# Patient Record
Sex: Female | Born: 1965 | Race: Black or African American | Hispanic: No | Marital: Single | Smoking: Former smoker
Health system: Southern US, Community
[De-identification: ages and names within clinical notes are randomized; demographics above are authoritative.]

## PROBLEM LIST (undated history)

## (undated) DIAGNOSIS — G4733 Obstructive sleep apnea (adult) (pediatric): Secondary | ICD-10-CM

## (undated) DIAGNOSIS — I509 Heart failure, unspecified: Secondary | ICD-10-CM

## (undated) DIAGNOSIS — E119 Type 2 diabetes mellitus without complications: Secondary | ICD-10-CM

## (undated) HISTORY — DX: Heart failure, unspecified: I50.9

## (undated) HISTORY — DX: Obstructive sleep apnea (adult) (pediatric): G47.33

## (undated) HISTORY — PX: ABDOMINAL HYSTERECTOMY: SHX81

---

## 2004-11-08 ENCOUNTER — Other Ambulatory Visit: Payer: Self-pay

## 2004-11-08 ENCOUNTER — Emergency Department: Payer: Self-pay | Admitting: Emergency Medicine

## 2005-02-25 ENCOUNTER — Emergency Department: Payer: Self-pay | Admitting: Unknown Physician Specialty

## 2009-04-26 ENCOUNTER — Emergency Department: Payer: Self-pay | Admitting: Emergency Medicine

## 2012-03-01 ENCOUNTER — Emergency Department: Payer: Self-pay | Admitting: Emergency Medicine

## 2012-03-01 LAB — CBC
HCT: 36.7 % (ref 35.0–47.0)
HGB: 11.2 g/dL — ABNORMAL LOW (ref 12.0–16.0)
MCH: 24.6 pg — ABNORMAL LOW (ref 26.0–34.0)
MCV: 81 fL (ref 80–100)
Platelet: 272 10*3/uL (ref 150–440)
RBC: 4.55 10*6/uL (ref 3.80–5.20)
WBC: 9.4 10*3/uL (ref 3.6–11.0)

## 2012-03-01 LAB — BASIC METABOLIC PANEL
Calcium, Total: 9.5 mg/dL (ref 8.5–10.1)
Co2: 26 mmol/L (ref 21–32)
EGFR (African American): >60
Osmolality: 274 (ref 275–301)
Potassium: 4 mmol/L (ref 3.5–5.1)

## 2012-05-03 ENCOUNTER — Ambulatory Visit: Payer: Self-pay | Admitting: Internal Medicine

## 2012-05-18 ENCOUNTER — Ambulatory Visit: Payer: Self-pay | Admitting: Internal Medicine

## 2012-06-17 ENCOUNTER — Ambulatory Visit: Payer: Self-pay | Admitting: Internal Medicine

## 2012-07-06 ENCOUNTER — Ambulatory Visit: Payer: Self-pay | Admitting: Obstetrics and Gynecology

## 2012-08-03 ENCOUNTER — Ambulatory Visit: Payer: Self-pay | Admitting: Obstetrics and Gynecology

## 2012-08-03 LAB — BASIC METABOLIC PANEL
BUN: 9 mg/dL (ref 7–18)
Calcium, Total: 8.9 mg/dL (ref 8.5–10.1)
Co2: 28 mmol/L (ref 21–32)
EGFR (African American): >60
EGFR (Non-African Amer.): >60
Osmolality: 279 (ref 275–301)
Sodium: 140 mmol/L (ref 136–145)

## 2012-08-09 ENCOUNTER — Inpatient Hospital Stay: Payer: Self-pay | Admitting: Obstetrics and Gynecology

## 2012-08-10 LAB — BASIC METABOLIC PANEL
BUN: 3 mg/dL — ABNORMAL LOW (ref 7–18)
Calcium, Total: 8 mg/dL — ABNORMAL LOW (ref 8.5–10.1)
Chloride: 108 mmol/L — ABNORMAL HIGH (ref 98–107)
Co2: 29 mmol/L (ref 21–32)
Creatinine: 0.58 mg/dL — ABNORMAL LOW (ref 0.60–1.30)
EGFR (African American): >60
EGFR (Non-African Amer.): >60
Glucose: 118 mg/dL — ABNORMAL HIGH (ref 65–99)
Osmolality: 283 (ref 275–301)
Potassium: 3.3 mmol/L — ABNORMAL LOW (ref 3.5–5.1)
Sodium: 143 mmol/L (ref 136–145)

## 2012-08-10 LAB — HEMATOCRIT: HCT: 26.8 % — ABNORMAL LOW (ref 35.0–47.0)

## 2013-07-18 ENCOUNTER — Ambulatory Visit: Payer: Self-pay | Admitting: Internal Medicine

## 2014-06-09 NOTE — Op Note (Signed)
PATIENT NAME:  Taylor [name], Taylor [name] MR#:  [id number] DATE OF BIRTH:  Aug 28, 1965  DATE OF PROCEDURE:  08/09/2012  PREOPERATIVE DIAGNOSES:  1.  Symptomatic fibroid uterus.  2.  Menorrhagia.   POSTOPERATIVE DIAGNOSES: 1.  Symptomatic fibroid uterus.  2.  Menorrhagia.  3.  [name] 10 cm left broad ligament fibroid. 4.  An 18 week fibroid uterus.   PROCEDURES: 1.  Total abdominal hysterectomy.  2.  Bilateral salpingectomies.  3.  Cystoscopy.   SURGEON: Suzy Bouchardhomas J. Zakariyah Freimark, M.D.   FIRST ASSISTANT: Veatrice Bourbonicky Evans, M.D.   ANESTHESIA: General endotracheal anesthesia.   INDICATIONS: This is [name] 49 year old gravida 4, para 4.  [name] patient with an 18 week symptomatic fibroid uterus.   DESCRIPTION OF PROCEDURE:  After adequate general endotracheal anesthesia, the patient was placed in the dorsal supine position, legs placed in the [place] stirrups. Abdominal, perineal and vaginal prep performed. The patient's bladder was catheterized with [name] Foley catheter yielding clear urine. The patient received 2 grams IV cefoxitin prior to commencement of the case. [name] vertical incision was made from the symphysis pubis to 3 cm inferior to the umbilicus. Sharp dissection was used to identify the fascia. The fascia was then opened in the midline. The peritoneum was opened sharply without difficulty. The fascia was opened from caudad to cephalad without difficulty. [name] large multilobed fibroid uterus was then delivered through the incision. The O'Connor-O'Sullivan retractor was brought up to the operative field.  Uterus approximately 18 weeks in size with [name] large left broad ligament fibroid noted.  The cornua were grasped with 2 large Kelly clamps and the round ligaments were bilaterally clamped, transected and suture ligated with 0 Vicryl suture. Gentle dissection and clamping occurred of the broad ligaments bilaterally. Approximately [name] 10 cm left broad ligament fibroid was identified. The peritoneum was opened overlying this fibroid and  the fibroid was grasped with thyroid tenaculum and put on traction. The fibroid was enucleated and ultimately delivered after the blood supply was clamped, suture ligated with 0 Vicryl suture. The uterine arteries were then bilaterally clamped, transected and suture ligated with 0 Vicryl suture. The vesicouterine peritoneal fold was identified and the bladder was reflected inferiorly as the cardinal ligaments were then clamped with straight Heaney clamps, transected and suture ligated with 0 Vicryl suture. Vaginal angles were then clamped and the uterus was delivered with part of the uterus previously amputated and the remaining left broad ligament was removed with the cervix. The vaginal cuff was then closed with 0 Vicryl suture; interrupted sutures were used.  The previously removed ovary and fallopian tubes from the uterus were then grasped with Babcock clamp and the fallopian tubes were bilaterally clamped, transected and suture ligated with 0 Vicryl suture. Ovaries remained bilaterally and there was good hemostasis noted. The patient's abdomen was copiously irrigated. No active bleeding noted. Given the amount of dissection, especially in the left broad ligament, cystoscopy was brought up to the operative field. The patient received 1 amp of indigo carmine. The cystoscope was advanced into the bladder and filled lactated Ringer's.  The ureteral orifices were identified bilaterally and [name] normal efflux of indigo carmine was seen from each ureteral ostium The Foley was replaced, and gloves were changed, and the patient's abdomen was then again irrigated and good hemostasis was noted. The retractor and all laparotomy sponges were removed from the patient's abdomen. The sponge and needle count were correct. The patient's anterior abdominal wall was closed with 1 PDS suture with modified Smead-Jones fashion. The  subcutaneous tissues were irrigated and then bovied for hemostasis. Given the depth of the subcutaneous  tissues, the subcutaneous tissues were closed with interrupted 2-0 chromic suture and the skin was reapproximated with staples. There were no complications. Estimated blood loss 400 mL. Intraoperative fluids 1100 mL. The patient tolerated the procedure well and was taken to the recovery room in good condition. ____________________________ Suzy Bouchard, MD tjs:sb D: 08/09/2012 10:08:28 ET T: 08/09/2012 10:19:30 ET JOB#: [id number]  cc: Suzy Bouchard, MD, <Dictator> Suzy Bouchard MD ELECTRONICALLY SIGNED 08/11/2012 10:17

## 2014-10-12 ENCOUNTER — Other Ambulatory Visit: Payer: Self-pay | Admitting: Internal Medicine

## 2014-10-12 DIAGNOSIS — Z1231 Encounter for screening mammogram for malignant neoplasm of breast: Secondary | ICD-10-CM

## 2014-10-26 ENCOUNTER — Ambulatory Visit
Admission: RE | Admit: 2014-10-26 | Discharge: 2014-10-26 | Disposition: A | Discharge status: Home / Self Care | Payer: BLUE CROSS/BLUE SHIELD | Source: Ambulatory Visit | Attending: Internal Medicine | Admitting: Internal Medicine

## 2014-10-26 DIAGNOSIS — Z1231 Encounter for screening mammogram for malignant neoplasm of breast: Secondary | ICD-10-CM | POA: Insufficient documentation

## 2015-08-30 ENCOUNTER — Inpatient Hospital Stay
Admission: EM | Admit: 2015-08-30 | Discharge: 2015-08-31 | DRG: 282 | Disposition: A | Discharge status: Home / Self Care | Payer: BLUE CROSS/BLUE SHIELD | Attending: Internal Medicine | Admitting: Internal Medicine

## 2015-08-30 ENCOUNTER — Encounter: Payer: Self-pay | Admitting: Emergency Medicine

## 2015-08-30 ENCOUNTER — Emergency Department: Payer: BLUE CROSS/BLUE SHIELD

## 2015-08-30 DIAGNOSIS — R609 Edema, unspecified: Secondary | ICD-10-CM | POA: Diagnosis present

## 2015-08-30 DIAGNOSIS — Z7984 Long term (current) use of oral hypoglycemic drugs: Secondary | ICD-10-CM | POA: Diagnosis not present

## 2015-08-30 DIAGNOSIS — Z79899 Other long term (current) drug therapy: Secondary | ICD-10-CM | POA: Diagnosis not present

## 2015-08-30 DIAGNOSIS — E119 Type 2 diabetes mellitus without complications: Secondary | ICD-10-CM | POA: Diagnosis present

## 2015-08-30 DIAGNOSIS — G4730 Sleep apnea, unspecified: Secondary | ICD-10-CM | POA: Diagnosis present

## 2015-08-30 DIAGNOSIS — I509 Heart failure, unspecified: Secondary | ICD-10-CM

## 2015-08-30 DIAGNOSIS — R0602 Shortness of breath: Secondary | ICD-10-CM | POA: Diagnosis present

## 2015-08-30 DIAGNOSIS — Z87891 Personal history of nicotine dependence: Secondary | ICD-10-CM | POA: Diagnosis not present

## 2015-08-30 DIAGNOSIS — I5041 Acute combined systolic (congestive) and diastolic (congestive) heart failure: Secondary | ICD-10-CM | POA: Diagnosis present

## 2015-08-30 DIAGNOSIS — I5023 Acute on chronic systolic (congestive) heart failure: Secondary | ICD-10-CM

## 2015-08-30 DIAGNOSIS — I110 Hypertensive heart disease with heart failure: Principal | ICD-10-CM | POA: Diagnosis present

## 2015-08-30 DIAGNOSIS — E785 Hyperlipidemia, unspecified: Secondary | ICD-10-CM | POA: Diagnosis present

## 2015-08-30 DIAGNOSIS — I214 Non-ST elevation (NSTEMI) myocardial infarction: Secondary | ICD-10-CM | POA: Diagnosis present

## 2015-08-30 HISTORY — DX: Type 2 diabetes mellitus without complications: E11.9

## 2015-08-30 LAB — CBC WITH DIFFERENTIAL/PLATELET
BASOS ABS: 0.1 10*3/uL (ref 0–0.1)
Basophils Relative: 1 %
EOS ABS: 0.1 10*3/uL (ref 0–0.7)
EOS PCT: 1 %
HCT: 40.1 % (ref 35.0–47.0)
HEMOGLOBIN: 13 g/dL (ref 12.0–16.0)
Lymphocytes Relative: 13 %
Lymphs Abs: 1.4 10*3/uL (ref 1.0–3.6)
MCH: 32.3 pg (ref 26.0–34.0)
MCHC: 32.5 g/dL (ref 32.0–36.0)
MCV: 99.4 fL (ref 80.0–100.0)
Monocytes Absolute: 0.6 10*3/uL (ref 0.2–0.9)
Monocytes Relative: 6 %
NEUTROS PCT: 79 %
Neutro Abs: 8.2 10*3/uL — ABNORMAL HIGH (ref 1.4–6.5)
PLATELETS: 285 10*3/uL (ref 150–440)
RBC: 4.03 MIL/uL (ref 3.80–5.20)
RDW: 14.5 % (ref 11.5–14.5)
WBC: 10.4 10*3/uL (ref 3.6–11.0)

## 2015-08-30 LAB — COMPREHENSIVE METABOLIC PANEL
ALBUMIN: 4.2 g/dL (ref 3.5–5.0)
ALK PHOS: 95 U/L (ref 38–126)
ALT: 48 U/L (ref 14–54)
AST: 27 U/L (ref 15–41)
Anion gap: 7 (ref 5–15)
BUN: 9 mg/dL (ref 6–20)
CHLORIDE: 104 mmol/L (ref 101–111)
CO2: 30 mmol/L (ref 22–32)
CREATININE: 0.67 mg/dL (ref 0.44–1.00)
Calcium: 9.5 mg/dL (ref 8.9–10.3)
GFR calc non Af Amer: >60 mL/min (ref >60)
GLUCOSE: 170 mg/dL — AB (ref 65–99)
Potassium: 3.8 mmol/L (ref 3.5–5.1)
SODIUM: 141 mmol/L (ref 135–145)
Total Bilirubin: 1.5 mg/dL — ABNORMAL HIGH (ref 0.3–1.2)
Total Protein: 7.2 g/dL (ref 6.5–8.1)

## 2015-08-30 LAB — TROPONIN I: Troponin I: 0.04 ng/mL (ref <0.03)

## 2015-08-30 LAB — GLUCOSE, CAPILLARY: GLUCOSE-CAPILLARY: 200 mg/dL — AB (ref 65–99)

## 2015-08-30 LAB — BRAIN NATRIURETIC PEPTIDE: B NATRIURETIC PEPTIDE 5: 1164 pg/mL — AB (ref 0.0–100.0)

## 2015-08-30 MED ORDER — FUROSEMIDE 10 MG/ML IJ SOLN
20.0000 mg | Freq: Two times a day (BID) | INTRAMUSCULAR | Status: DC
  Administered 2015-08-31: 20 mg via INTRAVENOUS
  Filled 2015-08-30: qty 2

## 2015-08-30 MED ORDER — FUROSEMIDE 10 MG/ML IJ SOLN
20.0000 mg | Freq: Once | INTRAMUSCULAR | Status: AC
  Administered 2015-08-30: 20 mg via INTRAVENOUS
  Filled 2015-08-30: qty 4

## 2015-08-30 MED ORDER — SODIUM CHLORIDE 0.9% FLUSH
3.0000 mL | INTRAVENOUS | Status: DC | PRN
  Administered 2015-08-31: 3 mL via INTRAVENOUS
  Filled 2015-08-30: qty 3

## 2015-08-30 MED ORDER — GLIMEPIRIDE 2 MG PO TABS
4.0000 mg | ORAL_TABLET | Freq: Every day | ORAL | Status: DC
  Administered 2015-08-31: 2 mg via ORAL
  Filled 2015-08-30: qty 2
  Filled 2015-08-30: qty 1

## 2015-08-30 MED ORDER — ASPIRIN 81 MG PO CHEW
324.0000 mg | CHEWABLE_TABLET | Freq: Once | ORAL | Status: AC
  Administered 2015-08-30: 324 mg via ORAL
  Filled 2015-08-30: qty 4

## 2015-08-30 MED ORDER — INSULIN ASPART 100 UNIT/ML ~~LOC~~ SOLN
0.0000 [IU] | Freq: Three times a day (TID) | SUBCUTANEOUS | Status: DC
  Administered 2015-08-31: 2 [IU] via SUBCUTANEOUS
  Administered 2015-08-31: 1 [IU] via SUBCUTANEOUS
  Filled 2015-08-30: qty 1
  Filled 2015-08-30: qty 2

## 2015-08-30 MED ORDER — SODIUM CHLORIDE 0.9% FLUSH
3.0000 mL | Freq: Two times a day (BID) | INTRAVENOUS | Status: DC
  Administered 2015-08-30 – 2015-08-31 (×2): 3 mL via INTRAVENOUS

## 2015-08-30 MED ORDER — METFORMIN HCL 500 MG PO TABS
1000.0000 mg | ORAL_TABLET | Freq: Two times a day (BID) | ORAL | Status: DC
  Administered 2015-08-31: 1000 mg via ORAL
  Filled 2015-08-30: qty 2

## 2015-08-30 MED ORDER — SODIUM CHLORIDE 0.9 % IV SOLN: 250.0000 mL | INTRAVENOUS | Status: DC | PRN

## 2015-08-30 MED ORDER — ENOXAPARIN SODIUM 40 MG/0.4ML ~~LOC~~ SOLN
40.0000 mg | SUBCUTANEOUS | Status: DC
  Administered 2015-08-30: 40 mg via SUBCUTANEOUS
  Filled 2015-08-30: qty 0.4

## 2015-08-30 MED ORDER — LOSARTAN POTASSIUM 50 MG PO TABS
100.0000 mg | ORAL_TABLET | Freq: Every day | ORAL | Status: DC
  Administered 2015-08-30: 50 mg via ORAL
  Administered 2015-08-31: 100 mg via ORAL
  Filled 2015-08-30 (×2): qty 2

## 2015-08-30 MED ORDER — ATORVASTATIN CALCIUM 20 MG PO TABS
80.0000 mg | ORAL_TABLET | Freq: Every day | ORAL | Status: DC
  Administered 2015-08-30: 80 mg via ORAL
  Filled 2015-08-30: qty 4

## 2015-08-30 MED ORDER — METOPROLOL TARTRATE 25 MG PO TABS
25.0000 mg | ORAL_TABLET | Freq: Two times a day (BID) | ORAL | Status: DC
  Administered 2015-08-30 – 2015-08-31 (×2): 25 mg via ORAL
  Filled 2015-08-30 (×2): qty 1

## 2015-08-30 MED ORDER — ONDANSETRON HCL 4 MG/2ML IJ SOLN: 4.0000 mg | Freq: Four times a day (QID) | INTRAMUSCULAR | Status: DC | PRN

## 2015-08-30 MED ORDER — ASPIRIN 81 MG PO CHEW
81.0000 mg | CHEWABLE_TABLET | Freq: Every day | ORAL | Status: DC
  Administered 2015-08-31: 81 mg via ORAL
  Filled 2015-08-30: qty 1

## 2015-08-30 MED ORDER — ACETAMINOPHEN 325 MG PO TABS: 650.0000 mg | ORAL_TABLET | ORAL | Status: DC | PRN

## 2015-08-30 NOTE — ED Provider Notes (Signed)
[facility] Emergency Department Provider Note   ____________________________________________  Time seen: Approximately 5:10 PM  I have reviewed the triage vital signs and the nursing notes.   HISTORY  Chief Complaint Shortness of Breath    HPI Taylor [name] is a 50 y.o. female with history of diabetes who presents for evaluation of shortness of breath worse with lying flat over the past 2-3 days, gradual onset, constant, currently moderate, no modifying factors. No chest pain or fevers, no vomiting, diarrhea, fevers or chills. No history of coronary artery disease or CHF.   Past Medical History  Diagnosis Date  . Diabetes mellitus without complication (HCC)     There are no active problems to display for this patient.   Past Surgical History  Procedure Laterality Date  . Abdominal hysterectomy      No current outpatient prescriptions on file.  Allergies Review of patient's allergies indicates no known allergies.  No family history on file.  Social History Social History  Substance Use Topics  . Smoking status: Former Games developermoker  . Smokeless tobacco: None  . Alcohol Use: Yes     Comment: occ    Review of Systems Constitutional: No fever/chills Eyes: No visual changes. ENT: No sore throat. Cardiovascular: Denies chest pain. Respiratory: +shortness of breath. Gastrointestinal: No abdominal pain.  No nausea, no vomiting.  No diarrhea.  No constipation. Genitourinary: Negative for dysuria. Musculoskeletal: Negative for back pain. Skin: Negative for rash. Neurological: Negative for headaches, focal weakness or numbness.  10-point ROS otherwise negative.  ____________________________________________   PHYSICAL EXAM:  VITAL SIGNS: ED Triage Vitals  Enc Vitals Group     BP 08/30/15 1550 143/90 mmHg     Pulse Rate 08/30/15 1550 100     Resp 08/30/15 1550 18     Temp 08/30/15 1550 98.5 F (36.9 C)     Temp Source 08/30/15 1550  Oral     SpO2 08/30/15 1550 96 %     Weight 08/30/15 1550 176 lb (79.833 kg)     Height 08/30/15 1550 5\' 1"  (1.549 m)     Head Cir --      Peak Flow --      Pain Score 08/30/15 1555 0     Pain Loc --      Pain Edu? --      Excl. in GC? --     Constitutional: Alert and oriented. Nontoxic appearing and in no acute distress. Eyes: Conjunctivae are normal. PERRL. EOMI. Head: Atraumatic. Nose: No congestion/rhinnorhea. Mouth/Throat: Mucous membranes are moist.  Oropharynx non-erythematous. Neck: No stridor.   Cardiovascular: Normal rate, regular rhythm. Grossly normal heart sounds.  Good peripheral circulation. Respiratory: Mild tachypnea, no increased work of breathing, crackles and Rales at bilateral bases. Gastrointestinal: Soft and nontender. No distention. No CVA tenderness. Genitourinary: deferred Musculoskeletal: 2+ pitting edema bilateral lower extremities.  No joint effusions. Neurologic:  Normal speech and language. No gross focal neurologic deficits are appreciated. No gait instability. Skin:  Skin is warm, dry and intact. No rash noted. Psychiatric: Mood and affect are normal. Speech and behavior are normal.  ____________________________________________   LABS (all labs ordered are listed, but only abnormal results are displayed)  Labs Reviewed  CBC WITH DIFFERENTIAL/PLATELET - Abnormal; Notable for the following:    Neutro Abs 8.2 (*)    All other components within normal limits  COMPREHENSIVE METABOLIC PANEL - Abnormal; Notable for the following:    Glucose, Bld 170 (*)    Total  Bilirubin 1.5 (*)    All other components within normal limits  TROPONIN I - Abnormal; Notable for the following:    Troponin I 0.04 (*)    All other components within normal limits  BRAIN NATRIURETIC PEPTIDE - Abnormal; Notable for the following:    B Natriuretic Peptide 1164.0 (*)    All other components within normal limits   ____________________________________________  EKG  ED  ECG REPORT I, Gayla Doss, the attending physician, personally viewed and interpreted this ECG.   Date: 08/30/2015  EKG Time: 16:14  Rate: 100  Rhythm: normal sinus rhythm  Axis: normal  Intervals:none  ST&T Change: No acute ST elevation or acute ST depression. Nonspecific T-wave abnormality.  ____________________________________________  RADIOLOGY  CXR IMPRESSION: Cardiomegaly with vascular congestion and tiny bilateral pleural effusions. Underlying component of interstitial pulmonary edema not excluded.  Probable basilar atelectasis. ____________________________________________   PROCEDURES  Procedure(s) performed: None  Procedures  Critical Care performed: No  ____________________________________________   INITIAL IMPRESSION / ASSESSMENT AND PLAN / ED COURSE  Pertinent labs & imaging results that were available during my care of the patient were reviewed by me and considered in my medical decision making (see chart for details).  Taylor [name] is a 50 y.o. female with history of diabetes who presents for evaluation of shortness of breath worse with lying flat over the past 2-3 days. On exam, she is nontoxic appearing and generally in no acute distress, mildly intermittently tachycardic and tachypneic with Rales in bilateral lung bases, 2+ pitting edema bilateral lower extremities concerning for new onset CHF. Troponin is mildly elevated 0.04 question N STEMI versus demand ischemia. BNP is elevated at greater than 1000, chest x-ray shows vascular congestion and interstitial edema cannot be excluded. We'll give IV Lasix as well as aspirin. Discussed with the hospitalist for admission 6:45 PM. ____________________________________________   FINAL CLINICAL IMPRESSION(S) / ED DIAGNOSES  Final diagnoses:  Acute congestive heart failure, unspecified congestive heart failure type (HCC)  NSTEMI (non-ST elevated myocardial infarction) (HCC)      NEW MEDICATIONS STARTED  DURING THIS VISIT:  New Prescriptions   No medications on file     Note:  This document was prepared using Dragon voice recognition software and may include unintentional dictation errors.    Gayla Doss, MD 08/30/15 [phone]

## 2015-08-30 NOTE — ED Notes (Signed)
Pt complains of cough, tightness in chest and shortness of breath for 2-3 days.

## 2015-08-30 NOTE — H&P (Signed)
[facility] [place] [facility]   PATIENT NAME: Taylor [name]    MR#:  [id number]  DATE OF BIRTH:  02-11-1966  DATE OF ADMISSION:  08/30/2015  PRIMARY CARE PHYSICIAN: Taylor RegulusANDERSON,MARSHALL W., MD   REQUESTING/REFERRING PHYSICIAN: dr Taylor [name]  CHIEF COMPLAINT:  Increasing shortness of breath for 2-3 days and leg edema.  HISTORY OF PRESENT ILLNESS:  Taylor [name]  is a 50 y.o. female with a known history ofHypertension, diabetes, hyperlipidemia comes to the emergency room with increasing shortness of breath and leg edema for the last 3 days. Patient is sitting of PND and orthopnea. In the emergency room workup showed patient has new onset congestive heart failure. She received 20 mg of IV Lasix. She is currently on oxygen sats 99% on 2 L. She is being admitted for further evaluation of management of acute onset congestive heart failure EF unknown at present.  PAST MEDICAL HISTORY:   Past Medical History  Diagnosis Date  . Diabetes mellitus without complication (HCC)     PAST SURGICAL HISTOIRY:   Past Surgical History  Procedure Laterality Date  . Abdominal hysterectomy      SOCIAL HISTORY:   Social History  Substance Use Topics  . Smoking status: Former Games developermoker  . Smokeless tobacco: Not on file  . Alcohol Use: Yes     Comment: occ    FAMILY HISTORY:  No family history on file.  DRUG ALLERGIES:  No Known Allergies  REVIEW OF SYSTEMS:  Review of Systems  Constitutional: Negative for fever, chills and weight loss.  HENT: Negative for ear discharge, ear pain and nosebleeds.   Eyes: Negative for blurred vision, pain and discharge.  Respiratory: Negative for sputum production, shortness of breath, wheezing and stridor.   Cardiovascular: Positive for orthopnea, leg swelling and PND. Negative for chest pain and palpitations.  Gastrointestinal: Negative for nausea, vomiting, abdominal pain and diarrhea.  Genitourinary: Negative for urgency and frequency.   Musculoskeletal: Negative for back pain and joint pain.  Neurological: Positive for weakness. Negative for sensory change, speech change and focal weakness.  Psychiatric/Behavioral: Negative for depression and hallucinations. The patient is not nervous/anxious.   All other systems reviewed and are negative.    MEDICATIONS AT HOME:   Prior to Admission medications   Not on File      VITAL SIGNS:  Blood pressure 143/90, pulse 101, temperature 98.5 F (36.9 C), temperature source Oral, resp. rate 44, height 5\' 1"  (1.549 m), weight 79.833 kg (176 lb), SpO2 99 %.  PHYSICAL EXAMINATION:  GENERAL:  50 y.o.-year-old patient lying in the bed with no acute distress.  EYES: Pupils equal, round, reactive to light and accommodation. No scleral icterus. Extraocular muscles intact.  HEENT: Head atraumatic, normocephalic. Oropharynx and nasopharynx clear.  NECK:  Supple, no jugular venous distention. No thyroid enlargement, no tenderness.  LUNGS: Normal breath sounds bilaterally, no wheezing,positive rales,no rhonchi. Few bibasilarcrepitation. No use of accessory muscles of respiration.  CARDIOVASCULAR: S1, S2 normal. No murmurs, rubs, or gallops.  ABDOMEN: Soft, nontender, nondistended. Bowel sounds present. No organomegaly or mass.  EXTREMITIES: + pedal edema, cyanosis, or clubbing.  NEUROLOGIC: Cranial nerves II through XII are intact. Muscle strength 5/5 in all extremities. Sensation intact. Gait not checked.  PSYCHIATRIC: The patient is alert and oriented x 3.  SKIN: No obvious rash, lesion, or ulcer.   LABORATORY PANEL:   CBC  Recent Labs Lab 08/30/15 1737  WBC 10.4  HGB 13.0  HCT 40.1  PLT 285   ------------------------------------------------------------------------------------------------------------------  Chemistries   Recent Labs Lab 08/30/15 1737  NA 141  K 3.8  CL 104  CO2 30  GLUCOSE 170*  BUN 9  CREATININE 0.67  CALCIUM 9.5  AST 27  ALT 48  ALKPHOS 95   BILITOT 1.5*   ------------------------------------------------------------------------------------------------------------------  Cardiac Enzymes  Recent Labs Lab 08/30/15 1737  TROPONINI 0.04*   ------------------------------------------------------------------------------------------------------------------  RADIOLOGY:  Dg Chest 2 View  08/30/2015  CLINICAL DATA:  Shortness of breath for a couple of days. EXAM: CHEST  2 VIEW COMPARISON:  None. FINDINGS: Two views study shows bibasilar atelectasis or infiltrate with tiny bilateral pleural effusions. Cardiopericardial silhouette is enlarged. Vascular congestion noted without overt airspace pulmonary edema. The visualized bony structures of the thorax are intact. IMPRESSION: Cardiomegaly with vascular congestion and tiny bilateral pleural effusions. Underlying component of interstitial pulmonary edema not excluded. Probable basilar atelectasis. Electronically Signed   By: Taylor [name] M.D.   On: 08/30/2015 16:18    EKG:   Normal sinus rhythm no acute ST elevation or depression.  IMPRESSION AND PLAN:   Taylor [name]  is a 50 y.o. female with a known history ofHypertension, diabetes, hyperlipidemia comes to the emergency room with increasing shortness of breath and leg edema for the last 3 days. Patient is sitting of PND and orthopnea. In the emergency room workup showed patient has new onset congestive heart failure.   1. Acute congestive heart failure. EF unknown. -Admit to telemetry -IV Lasix 20 mg twice a day. Monitor daily weights, I's and O's and metabolic panel -Echo of the heart -Cardiology consultation placed -We'll resumes patient home meds which is losartan. Add beta blockers.  2. Hyperlipidemia continue atorvastatin  3. Type 2 diabetes -Continue Amaryl and metoprolol. I will hold off on Actos given congestive heart failure. Continue sliding scale insulin.  4. Suspected sleep apnea -Patient will benefit from sleep study  as outpatient. We'll defer to primary care physician  5. DVT prophylaxis subcutaneous Lovenox    All the records are reviewed and case discussed with ED provider. Management plans discussed with the patient, family and they are in agreement.  CODE STATUS: Full  TOTAL TIME TAKING CARE OF THIS PATIENT: 45s.    Dorianna Mckiver M.D on 08/30/2015 at 7:17 PM  Between 7am to 6pm - Pager - [phone]  After 6pm go to [email or web address] - password [email or web address] [facility]  [facility]  Office  [phone]  CC: Primary care physician; Taylor Regulus., MD

## 2015-08-31 ENCOUNTER — Inpatient Hospital Stay
Admit: 2015-08-31 | Discharge: 2015-08-31 | Disposition: A | Discharge status: Home / Self Care | Payer: BLUE CROSS/BLUE SHIELD | Attending: Internal Medicine | Admitting: Internal Medicine

## 2015-08-31 LAB — GLUCOSE, CAPILLARY
GLUCOSE-CAPILLARY: 149 mg/dL — AB (ref 65–99)
Glucose-Capillary: 184 mg/dL — ABNORMAL HIGH (ref 65–99)
Glucose-Capillary: 102 mg/dL — ABNORMAL HIGH (ref 65–99)

## 2015-08-31 LAB — BASIC METABOLIC PANEL
Anion gap: 7 (ref 5–15)
BUN: 8 mg/dL (ref 6–20)
CALCIUM: 8.9 mg/dL (ref 8.9–10.3)
CHLORIDE: 104 mmol/L (ref 101–111)
CO2: 31 mmol/L (ref 22–32)
CREATININE: 0.53 mg/dL (ref 0.44–1.00)
GFR calc non Af Amer: >60 mL/min (ref >60)
Glucose, Bld: 127 mg/dL — ABNORMAL HIGH (ref 65–99)
Potassium: 3 mmol/L — ABNORMAL LOW (ref 3.5–5.1)
SODIUM: 142 mmol/L (ref 135–145)

## 2015-08-31 LAB — PHOSPHORUS: Phosphorus: 5.7 mg/dL — ABNORMAL HIGH (ref 2.5–4.6)

## 2015-08-31 LAB — MAGNESIUM: MAGNESIUM: 1.6 mg/dL — AB (ref 1.7–2.4)

## 2015-08-31 LAB — ECHOCARDIOGRAM COMPLETE
HEIGHTINCHES: 61 in
Weight: 2752 oz

## 2015-08-31 MED ORDER — FUROSEMIDE 20 MG PO TABS: 20.0000 mg | ORAL_TABLET | Freq: Every day | ORAL | Status: DC

## 2015-08-31 MED ORDER — MAGNESIUM SULFATE 2 GM/50ML IV SOLN
2.0000 g | Freq: Once | INTRAVENOUS | Status: AC
  Administered 2015-08-31: 2 g via INTRAVENOUS
  Filled 2015-08-31: qty 50

## 2015-08-31 MED ORDER — POTASSIUM CHLORIDE CRYS ER 20 MEQ PO TBCR
40.0000 meq | EXTENDED_RELEASE_TABLET | Freq: Once | ORAL | Status: AC
  Administered 2015-08-31: 40 meq via ORAL

## 2015-08-31 MED ORDER — POTASSIUM CHLORIDE CRYS ER 20 MEQ PO TBCR
40.0000 meq | EXTENDED_RELEASE_TABLET | Freq: Once | ORAL | Status: AC
  Administered 2015-08-31: 40 meq via ORAL
  Filled 2015-08-31: qty 2

## 2015-08-31 NOTE — Consult Note (Signed)
[facility] Cardiology Consultation Note  Patient ID: GRACEANNE GUIN, MRN: [id number], DOB/AGE: 06/19/1965 50 y.o. Admit date: 08/30/2015   Date of Consult: 08/31/2015 Primary Physician: Lauro Regulus., MD Primary Cardiologist: None  Chief Complaint:  Chief Complaint  Patient presents with  . Shortness of Breath   Reason for Consult: acute congestive heart failure  HPI: 50 y.o. female with diabetes with complication essential hypertension mixed hyperlipidemia with acute onset of significant shortness of breath weakness fatigue get in the last several weeks to a month without evidence of chest discomfort waxing and waning to the point where she cannot do any physical activity at all and additional had significant orthopnea and PND with some lower extremity edema. The patient was seen in the emergency room with an EKG showing normal sinus rhythm left atrial enlargement and nonspecific ST and T-wave changes with an elevated troponin of 0.04 most consistent with demand ischemia. The patient did have a chest x-ray showing significant pulmonary edema consistent with acute systolic dysfunction congestive heart failure. The patient has had significant improvements of the this with oxygenation and intravenous diuretics. Patient does have stable hypertension with use of losartan and metoprolol and high intensity cholesterol therapy with atorvastatin  Past Medical History  Diagnosis Date  . Diabetes mellitus without complication [facility])       Surgical History:  Past Surgical History  Procedure Laterality Date  . Abdominal hysterectomy       Home Meds: Prior to Admission medications   Medication Sig Start Date End Date Taking? Authorizing Provider  atorvastatin (LIPITOR) 80 MG tablet Take 1 tablet by mouth daily. 02/15/15 02/15/16 Yes Historical Provider, MD  canagliflozin (INVOKANA) 100 MG TABS tablet Take 1 tablet by mouth daily. 05/24/15  Yes Historical Provider, MD  glimepiride (AMARYL) 2  MG tablet Take 2 tablets by mouth 2 (two) times daily. 02/15/15  Yes Historical Provider, MD  losartan (COZAAR) 100 MG tablet Take 1 tablet by mouth daily. 02/15/15  Yes Historical Provider, MD  metFORMIN (GLUCOPHAGE) 500 MG tablet Take 2 tablets by mouth 2 (two) times daily with a meal. 02/15/15  Yes Historical Provider, MD  pioglitazone (ACTOS) 30 MG tablet Take 1 tablet by mouth daily. 02/15/15  Yes Historical Provider, MD    Inpatient Medications:  . aspirin  81 mg Oral Daily  . atorvastatin  80 mg Oral q1800  . enoxaparin (LOVENOX) injection  40 mg Subcutaneous Q24H  . furosemide  20 mg Intravenous BID  . glimepiride  4 mg Oral Q breakfast  . insulin aspart  0-9 Units Subcutaneous TID WC  . losartan  100 mg Oral Daily  . metFORMIN  1,000 mg Oral BID WC  . metoprolol tartrate  25 mg Oral BID  . sodium chloride flush  3 mL Intravenous Q12H      Allergies: No Known Allergies  Social History   Social History  . Marital Status: Single    Spouse Name: N/A  . Number of Children: N/A  . Years of Education: N/A   Occupational History  . Not on file.   Social History Main Topics  . Smoking status: Former Games developer  . Smokeless tobacco: Not on file  . Alcohol Use: Yes     Comment: occ  . Drug Use: Not on file  . Sexual Activity: Not on file   Other Topics Concern  . Not on file   Social History Narrative     No family history on file.   Review of Systems  Positive for Shortness of breath PND orthopnea Negative for: General:  chills, fever, night sweats or weight changes.  Cardiovascular: Positive for PND orthopnea negative for syncope dizziness  Dermatological skin lesions rashes Respiratory: Positive for Cough congestion Urologic: Frequent urination urination at night and hematuria Abdominal: negative for nausea, vomiting, diarrhea, bright red blood per rectum, melena, or hematemesis Neurologic: negative for visual changes, and/or hearing changes  All other systems  reviewed and are otherwise negative except as noted above.  Labs:  Recent Labs  08/30/15 1737  TROPONINI 0.04*   Lab Results  Component Value Date   WBC 10.4 08/30/2015   HGB 13.0 08/30/2015   HCT 40.1 08/30/2015   MCV 99.4 08/30/2015   PLT 285 08/30/2015    Recent Labs Lab 08/30/15 1737 08/31/15 0509  NA 141 142  K 3.8 3.0*  CL 104 104  CO2 30 31  BUN 9 8  CREATININE 0.67 0.53  CALCIUM 9.5 8.9  PROT 7.2  --   BILITOT 1.5*  --   ALKPHOS 95  --   ALT 48  --   AST 27  --   GLUCOSE 170* 127*   No results found for: CHOL, HDL, LDLCALC, TRIG No results found for: DDIMER  Radiology/Studies:  Dg Chest 2 View  08/30/2015  CLINICAL DATA:  Shortness of breath for a couple of days. EXAM: CHEST  2 VIEW COMPARISON:  None. FINDINGS: Two views study shows bibasilar atelectasis or infiltrate with tiny bilateral pleural effusions. Cardiopericardial silhouette is enlarged. Vascular congestion noted without overt airspace pulmonary edema. The visualized bony structures of the thorax are intact. IMPRESSION: Cardiomegaly with vascular congestion and tiny bilateral pleural effusions. Underlying component of interstitial pulmonary edema not excluded. Probable basilar atelectasis. Electronically Signed   By: Kennith CenterEric  Mansell M.D.   On: 08/30/2015 16:18    EKG: Normal sinus rhythm with left atrial enlargement and nonspecific ST and T-wave changes  Weights: Filed Weights   08/30/15 1550 08/30/15 2002 08/31/15 0518  Weight: 176 lb (79.833 kg) 174 lb 12.8 oz (79.289 kg) 172 lb (78.019 kg)     Physical Exam: Blood pressure 138/92, pulse 96, temperature 97.9 F (36.6 C), temperature source Oral, resp. rate 24, height 5\' 1"  (1.549 m), weight 172 lb (78.019 kg), SpO2 100 %. Body mass index is 32.52 kg/(m^2). General: Well developed, well nourished, in no acute distress. Head eyes ears nose throat: Normocephalic, atraumatic, sclera non-icteric, no xanthomas, nares are without discharge. No  apparent thyromegaly and/or mass  Lungs: Normal respiratory effort.  no wheezes,Basilar rales, no rhonchi.  Heart: RRR with normal S1 S2. no murmur gallop, no rub, PMI is normal size and placement, carotid upstroke normal without bruit, jugular venous pressure is normal Abdomen: Soft, non-tender, non-distended with normoactive bowel sounds. No hepatomegaly. No rebound/guarding. No obvious abdominal masses. Abdominal aorta is normal size without bruit Extremities: Trace to 1+ edema. no cyanosis, no clubbing, no ulcers  Peripheral : 2+ bilateral upper extremity pulses, 2+ bilateral femoral pulses, 2+ bilateral dorsal pedal pulse Neuro: Alert and oriented. No facial asymmetry. No focal deficit. Moves all extremities spontaneously. Musculoskeletal: Normal muscle tone without kyphosis Psych:  Responds to questions appropriately with a normal affect.    Assessment: 50 year old female with diabetes with complications essential hypertension makes hyperlipidemia and abnormal EKG with elevated troponin consistent with demand ischemia rather than acute coronary syndrome having new onset acute systolic dysfunction congestive heart failure  Plan: 1. Continue intravenous Lasix for pulmonary edema lower extremity edema and acute  systolic dysfunction congestive heart failure 2. Continue hypertension control with beta blocker and angiotensin receptor blocker 3. Serial ECG and enzymes to assess for possible myocardial infarction 4. Echocardiogram for LV systolic dysfunction and cause of systolic dysfunction congestive heart failure 5. The cholesterol therapy with atorvastatin 6. Further diagnostic testing and treatment options after above  Signed, Lamar Blinks M.D. [facility] Cardiology 08/31/2015, 8:55 AM

## 2015-08-31 NOTE — Progress Notes (Signed)
Oxygen via Daisy dc'd.  On room air SpO2 ranged from 93 - 99%.  Over a 15 minute period she had no S&S of resp distress.

## 2015-08-31 NOTE — Discharge Instructions (Signed)
Heart Failure Clinic appointment on September 13, 2015 at 12:00pm with Clarisa Kindredina Master Touchet, FNP. Please call [phone] to reschedule.

## 2015-08-31 NOTE — Discharge Summary (Signed)
[facility] [place] [facility]   PATIENT NAME: Taylor RochesterLori [name]    MR#:  [id number]  DATE OF BIRTH:  [birth year]  DATE OF ADMISSION:  08/30/2015 ADMITTING PHYSICIAN: Enedina FinnerSona Patel, MD  DATE OF DISCHARGE: 08/31/2015  PRIMARY CARE PHYSICIAN: Lauro RegulusANDERSON,MARSHALL W., MD    ADMISSION DIAGNOSIS:  NSTEMI (non-ST elevated myocardial infarction) (HCC) [I21.4] Acute congestive heart failure, unspecified congestive heart failure type (HCC) [I50.9]  DISCHARGE DIAGNOSIS:  Active Problems:   CHF (congestive heart failure) (HCC)   SECONDARY DIAGNOSIS:   Past Medical History  Diagnosis Date  . Diabetes mellitus without complication [facility](HCC)     HOSPITAL COURSE:   50 year female with a history of essential hypertension and diabetes who presents with shortness of breath and lower extremity edema.  1. Acute congestive heart failure with Echo reporting Ef of 20% and diffuse hypokinese: Patient has responded well to IV Lasix.Her symptoms improved and she diuresed well. She will need close outpatient follow up with Cardiology. She will need outpatient stress test/cardiac cath to better evaluate hypokinesis seen on ECHO. She never had chest pain while in the hospital. Continue metoprolol and losartan. Dr Gwen PoundsKowalski was involved in her case during this hospital stay and she will follow up with him next week.   2. Diabetes: Continue her outpatient regimen  and ADA diet.  3. Hyperlipidemia: Continue atorvastatin.  4. Suspected sleep apnea: Patient would benefit from outpatient sleep study which will be deferred to PCP.   DISCHARGE CONDITIONS AND DIET:   Cardiac diabetic diet Stable condition  CONSULTS OBTAINED:  Treatment Team:  Lamar BlinksBruce J Kowalski, MD  DRUG ALLERGIES:  No Known Allergies  DISCHARGE MEDICATIONS:   Current Discharge Medication List    START taking these medications   Details  furosemide (LASIX) 20 MG tablet Take 1 tablet (20 mg total) by mouth daily. Qty: 30  tablet, Refills: 0      CONTINUE these medications which have NOT CHANGED   Details  atorvastatin (LIPITOR) 80 MG tablet Take 1 tablet by mouth daily.    canagliflozin (INVOKANA) 100 MG TABS tablet Take 1 tablet by mouth daily.    glimepiride (AMARYL) 2 MG tablet Take 2 tablets by mouth 2 (two) times daily.    losartan (COZAAR) 100 MG tablet Take 1 tablet by mouth daily.    metFORMIN (GLUCOPHAGE) 500 MG tablet Take 2 tablets by mouth 2 (two) times daily with a meal.    pioglitazone (ACTOS) 30 MG tablet Take 1 tablet by mouth daily.              Today   CHIEF COMPLAINT:  SOB resolved LE resolved   VITAL SIGNS:  Blood pressure 116/84, pulse 88, temperature 97.7 F (36.5 C), temperature source Oral, resp. rate 20, height 5\' 1"  (1.549 m), weight 78.019 kg (172 lb), SpO2 97 %.   REVIEW OF SYSTEMS:  Review of Systems  Constitutional: Negative for fever, chills and malaise/fatigue.  HENT: Negative for ear discharge, ear pain, hearing loss, nosebleeds and sore throat.   Eyes: Negative for blurred vision and pain.  Respiratory: Negative for cough, hemoptysis, shortness of breath and wheezing.   Cardiovascular: Negative for chest pain, palpitations and leg swelling.  Gastrointestinal: Negative for nausea, vomiting, abdominal pain, diarrhea and blood in stool.  Genitourinary: Negative for dysuria.  Musculoskeletal: Negative for back pain.  Neurological: Negative for dizziness, tremors, speech change, focal weakness, seizures and headaches.  Endo/Heme/Allergies: Does not bruise/bleed easily.  Psychiatric/Behavioral: Negative for depression, suicidal ideas  and hallucinations.     PHYSICAL EXAMINATION:  GENERAL:  50 y.o.-year-old patient lying in the bed with no acute distress.  NECK:  Supple, no jugular venous distention. No thyroid enlargement, no tenderness.  LUNGS: Normal breath sounds bilaterally, no wheezing, rales,rhonchi  No use of accessory muscles of respiration.   CARDIOVASCULAR: S1, S2 normal. No murmurs, rubs, or gallops.  ABDOMEN: Soft, non-tender, non-distended. Bowel sounds present. No organomegaly or mass.  EXTREMITIES: No pedal edema, cyanosis, or clubbing.  PSYCHIATRIC: The patient is alert and oriented x 3.  SKIN: No obvious rash, lesion, or ulcer.   DATA REVIEW:   CBC  Recent Labs Lab 08/30/15 1737  WBC 10.4  HGB 13.0  HCT 40.1  PLT 285    Chemistries   Recent Labs Lab 08/30/15 1737 08/31/15 0509  NA 141 142  K 3.8 3.0*  CL 104 104  CO2 30 31  GLUCOSE 170* 127*  BUN 9 8  CREATININE 0.67 0.53  CALCIUM 9.5 8.9  MG  --  1.6*  AST 27  --   ALT 48  --   ALKPHOS 95  --   BILITOT 1.5*  --     Cardiac Enzymes  Recent Labs Lab 08/30/15 1737  TROPONINI 0.04*    Microbiology Results  @  RADIOLOGY:  Dg Chest 2 View  08/30/2015  CLINICAL DATA:  Shortness of breath for a couple of days. EXAM: CHEST  2 VIEW COMPARISON:  None. FINDINGS: Two views study shows bibasilar atelectasis or infiltrate with tiny bilateral pleural effusions. Cardiopericardial silhouette is enlarged. Vascular congestion noted without overt airspace pulmonary edema. The visualized bony structures of the thorax are intact. IMPRESSION: Cardiomegaly with vascular congestion and tiny bilateral pleural effusions. Underlying component of interstitial pulmonary edema not excluded. Probable basilar atelectasis. Electronically Signed   By: Kennith Center M.D.   On: 08/30/2015 16:18      Management plans discussed with the patient and she is in agreement. Stable for discharge home  Patient should follow up with dr Gwen Pounds 1 week  CODE STATUS:     Code Status Orders        Start     Ordered   08/30/15 2002  Full code   Continuous     08/30/15 2001    Code Status History    Date Active Date Inactive Code Status Order ID Comments User Context   This patient has a current code status but no historical code status.      TOTAL TIME  TAKING CARE OF THIS PATIENT: 35 minutes.    Note: This dictation was prepared with Dragon dictation along with smaller phrase technology. Any transcriptional errors that result from this process are unintentional.  Margretta Zamorano M.D on 08/31/2015 at 1:49 PM  Between 7am to 6pm - Pager - [phone] After 6pm go to [email or web address] - password [email or web address]  [facility] [place]  Office  [phone]  CC: Primary care physician; Lauro Regulus., MD

## 2015-08-31 NOTE — Progress Notes (Signed)
Electrolyte Replacement CONSULT NOTE - INITIAL   Pharmacy Consult for Electrolyte Supplementation   No Known Allergies  Patient Measurements: Height: 5\' 1"  (154.9 cm) Weight: 172 lb (78.019 kg) IBW/kg (Calculated) : 47.8   Vital Signs: Temp: 97.9 F (36.6 C) (07/14 0749) Temp Source: Oral (07/14 0749) BP: 138/92 mmHg (07/14 0749) Pulse Rate: 96 (07/14 0749) Intake/Output from previous day: 07/13 0701 - 07/14 0700 In: 3 [I.V.:3] Out: -  Intake/Output from this shift: Total I/O In: 293 [P.O.:240; I.V.:3; IV Piggyback:50] Out: 800 [Urine:800]  Labs:  Recent Labs  08/30/15 1737 08/31/15 0509  WBC 10.4  --   HGB 13.0  --   HCT 40.1  --   PLT 285  --   CREATININE 0.67 0.53  MG  --  1.6*  PHOS  --  5.7*  ALBUMIN 4.2  --   PROT 7.2  --   AST 27  --   ALT 48  --   ALKPHOS 95  --   BILITOT 1.5*  --    Estimated Creatinine Clearance: 78.7 mL/min (by C-G formula based on Cr of 0.53).   Microbiology: No results found for this or any previous visit (from the past 720 hour(s)).  Medical History: Past Medical History  Diagnosis Date  . Diabetes mellitus without complication (HCC)     Medications:  Scheduled:  . aspirin  81 mg Oral Daily  . atorvastatin  80 mg Oral q1800  . enoxaparin (LOVENOX) injection  40 mg Subcutaneous Q24H  . furosemide  20 mg Intravenous BID  . glimepiride  4 mg Oral Q breakfast  . insulin aspart  0-9 Units Subcutaneous TID WC  . losartan  100 mg Oral Daily  . magnesium sulfate 1 - 4 g bolus IVPB  2 g Intravenous Once  . metFORMIN  1,000 mg Oral BID WC  . metoprolol tartrate  25 mg Oral BID  . potassium chloride  40 mEq Oral Once  . sodium chloride flush  3 mL Intravenous Q12H    Assessment: Pharmacy consulted to replace electrolytes in a 50 yo female admitted with new onset CHF.  Patient is currently ordered furosemide 20 mg IV q12h.   Potassium this AM of 3.0, Mag: 1.6, Phos: 5.7   Plan:  Will supplement with Potassium  Chloride 40 meq po x 2 doses and recheck level at 1800 this evening to ensure no further supplementation.  Patient may require chronic potassium supplementation if requires scheduled loop diuretic for HF symptom management.  Will also order a follow BMP/mag/phos in AM.   Clarisa Schoolsrystal Orien Mayhall, PharmD Clinical Pharmacist 08/31/2015

## 2015-08-31 NOTE — Progress Notes (Signed)
*  PRELIMINARY RESULTS* Echocardiogram 2D Echocardiogram has been performed.  Cristela BlueHege, Shamere Dilworth 08/31/2015, 9:54 AM

## 2015-08-31 NOTE — Progress Notes (Signed)
Arrival Method: via wheelchair with ED tech & husband Mental Orientation: A&O Telemetry: MX40-26 Skin: intact, verified by Mayra Neerakera Nesbitt, RN IV: 20g right AC Pain: no pain Tubes: O2 2L acutely  Safety Measures: Safety Fall Prevention Plan has been given, discussed & signed, non skid socks in place. 2A Orientation: Patient has been orientated to the room, unit & staff.  Family: Has been informed of plan of care.  Orders have been reviewed & implemented. Will continue to monitor the patient. Call light has been placed within reach.  Eden LatheLexi Miller, RN

## 2015-08-31 NOTE — Progress Notes (Signed)
Initial Heart Failure Clinic appointment scheduled for September 13, 2015 at 12:00pm. Thank you.

## 2015-08-31 NOTE — Care Management (Signed)
New diagnosis of CHf. Referral made to heart failure clinic. Met with patient. She lives at home with her boyfriend who is her support system. She states she has some scales. Patient continues to work and drive. No DME. PCP is Dr. Ouida Sills. Pharmacy: [facility]. No needs identified for home. Will assist as needed.

## 2015-08-31 NOTE — Progress Notes (Signed)
[facility] - [place] [facility]   PATIENT NAME: Taylor [name] [name]    MR#:  [id number]  DATE OF BIRTH:  1965-06-28  SUBJECTIVE:   Sob has improved Lower extremity edema has improved.  REVIEW OF SYSTEMS:    Review of Systems  Constitutional: Negative for fever, chills and malaise/fatigue.  HENT: Negative for ear discharge, ear pain, hearing loss, nosebleeds and sore throat.   Eyes: Negative for blurred vision and pain.  Respiratory: Negative for cough, hemoptysis, shortness of breath and wheezing.   Cardiovascular: Negative for chest pain, palpitations and leg swelling.  Gastrointestinal: Negative for nausea, vomiting, abdominal pain, diarrhea and blood in stool.  Genitourinary: Negative for dysuria.  Musculoskeletal: Negative for back pain.  Neurological: Negative for dizziness, tremors, speech change, focal weakness, seizures and headaches.  Endo/Heme/Allergies: Does not bruise/bleed easily.  Psychiatric/Behavioral: Negative for depression, suicidal ideas and hallucinations.    Tolerating Diet: yes      DRUG ALLERGIES:  No Known Allergies  VITALS:  Blood pressure 116/84, pulse 88, temperature 97.7 F (36.5 C), temperature source Oral, resp. rate 20, height 5\' 1"  (1.549 m), weight 78.019 kg (172 lb), SpO2 97 %.  PHYSICAL EXAMINATION:   Physical Exam  Constitutional: She is oriented to person, place, and time and well-developed, well-nourished, and in no distress. No distress.  HENT:  Head: Normocephalic.  Eyes: No scleral icterus.  Neck: Normal range of motion. Neck supple. No JVD present. No tracheal deviation present.  Cardiovascular: Normal rate, regular rhythm and normal heart sounds.  Exam reveals no gallop and no friction rub.   No murmur heard. Pulmonary/Chest: Effort normal and breath sounds normal. No respiratory distress. She has no wheezes. She has no rales. She exhibits no tenderness.  Abdominal: Soft. Bowel sounds are normal. She exhibits no  distension and no mass. There is no tenderness. There is no rebound and no guarding.  Musculoskeletal: Normal range of motion. She exhibits no edema.  Neurological: She is alert and oriented to person, place, and time.  Skin: Skin is warm. No rash noted. No erythema.  Psychiatric: Affect and judgment normal.      LABORATORY PANEL:   CBC  Recent Labs Lab 08/30/15 1737  WBC 10.4  HGB 13.0  HCT 40.1  PLT 285   ------------------------------------------------------------------------------------------------------------------  Chemistries   Recent Labs Lab 08/30/15 1737 08/31/15 0509  NA 141 142  K 3.8 3.0*  CL 104 104  CO2 30 31  GLUCOSE 170* 127*  BUN 9 8  CREATININE 0.67 0.53  CALCIUM 9.5 8.9  MG  --  1.6*  AST 27  --   ALT 48  --   ALKPHOS 95  --   BILITOT 1.5*  --    ------------------------------------------------------------------------------------------------------------------  Cardiac Enzymes  Recent Labs Lab 08/30/15 1737  TROPONINI 0.04*   ------------------------------------------------------------------------------------------------------------------  RADIOLOGY:  Dg Chest 2 View  08/30/2015  CLINICAL DATA:  Shortness of breath for a couple of days. EXAM: CHEST  2 VIEW COMPARISON:  None. FINDINGS: Two views study shows bibasilar atelectasis or infiltrate with tiny bilateral pleural effusions. Cardiopericardial silhouette is enlarged. Vascular congestion noted without overt airspace pulmonary edema. The visualized bony structures of the thorax are intact. IMPRESSION: Cardiomegaly with vascular congestion and tiny bilateral pleural effusions. Underlying component of interstitial pulmonary edema not excluded. Probable basilar atelectasis. Electronically Signed   By: Kennith CenterEric  Mansell M.D.   On: 08/30/2015 16:18     ASSESSMENT AND PLAN:    [age] year female with a history  of essential hypertension and diabetes who presents with shortness of breath and lower  extremity edema.  1. Acute congestive heart failure: Patient has responded well to IV Lasix. Follow up on echo cardiac exam. Continue metoprolol and losartan.  2. Diabetes: Continue her outpatient regimen with sliding scale insulin and ADA diet.  3. Hyperlipidemia: Continue atorvastatin.  4. Suspected sleep apnea: Patient would benefit from outpatient sleep study which will be deferred to PCP.  Management plans discussed with the patient and she is in agreement.  CODE STATUS: full  TOTAL TIME TAKING CARE OF THIS PATIENT: 30 minutes.   D.w dr Gwen Pounds  POSSIBLE D/C 1-2 days, DEPENDING ON CLINICAL CONDITION.   Arijana Narayan M.D on 08/31/2015 at 12:44 PM  Between 7am to 6pm - Pager - [phone] After 6pm go to [email or web address] - password [email or web address]  [facility] [place] [facility]  Office  [phone]  CC: Primary care physician; Lauro Regulus., MD  Note: This dictation was prepared with Dragon dictation along with smaller phrase technology. Any transcriptional errors that result from this process are unintentional.

## 2015-08-31 NOTE — Progress Notes (Signed)
Patient ambulated around the unit twice. SpO2 remained at 97%.

## 2015-09-13 ENCOUNTER — Ambulatory Visit: Payer: BLUE CROSS/BLUE SHIELD | Attending: Family | Admitting: Family

## 2015-09-13 ENCOUNTER — Encounter: Payer: Self-pay | Admitting: Family

## 2015-09-13 VITALS — BP 105/71 | HR 101 | Resp 18 | Ht 61.0 in | Wt 170.0 lb

## 2015-09-13 DIAGNOSIS — Z7984 Long term (current) use of oral hypoglycemic drugs: Secondary | ICD-10-CM | POA: Diagnosis not present

## 2015-09-13 DIAGNOSIS — E119 Type 2 diabetes mellitus without complications: Secondary | ICD-10-CM | POA: Diagnosis not present

## 2015-09-13 DIAGNOSIS — Z87891 Personal history of nicotine dependence: Secondary | ICD-10-CM | POA: Insufficient documentation

## 2015-09-13 DIAGNOSIS — Z90710 Acquired absence of both cervix and uterus: Secondary | ICD-10-CM | POA: Insufficient documentation

## 2015-09-13 DIAGNOSIS — Z79899 Other long term (current) drug therapy: Secondary | ICD-10-CM | POA: Diagnosis not present

## 2015-09-13 DIAGNOSIS — R000 Tachycardia, unspecified: Secondary | ICD-10-CM | POA: Insufficient documentation

## 2015-09-13 DIAGNOSIS — I5022 Chronic systolic (congestive) heart failure: Secondary | ICD-10-CM | POA: Diagnosis present

## 2015-09-13 MED ORDER — FUROSEMIDE 20 MG PO TABS
20.0000 mg | ORAL_TABLET | Freq: Every day | ORAL | 5 refills | Status: DC
Start: 2015-09-13 — End: 2016-02-25

## 2015-09-13 NOTE — Progress Notes (Signed)
Subjective:    Patient ID: Taylor [name], female    DOB: Dec 31, 1965, 50 y.o.   MRN: [id number]  Congestive Heart Failure  Presents for initial visit. The disease course has been stable. Pertinent negatives include no abdominal pain, chest pain, edema, fatigue, orthopnea, palpitations or shortness of breath. The symptoms have been stable. Past treatments include beta blockers, angiotensin receptor blockers and salt and fluid restriction. The treatment provided significant relief. Compliance with prior treatments has been good. Her past medical history is significant for DM. There is no history of CAD, CVA or HTN.  Other  This is a chronic (diabetes) problem. The current episode started more than 1 year ago. The problem has been unchanged. Pertinent negatives include no abdominal pain, chest pain, congestion, coughing, fatigue, headaches, neck pain, sore throat, visual change or weakness. Nothing aggravates the symptoms.    Past Medical History:  Diagnosis Date  . Diabetes mellitus without complication [facility])     Past Surgical History:  Procedure Laterality Date  . ABDOMINAL HYSTERECTOMY      No family history on file.  Social History  Substance Use Topics  . Smoking status: Former Games developer  . Smokeless tobacco: Never Used  . Alcohol use No     Comment: occ    No Known Allergies  Prior to Admission medications   Medication Sig Start Date End Date Taking? Authorizing Provider  atorvastatin (LIPITOR) 80 MG tablet Take 0.5 tablets by mouth daily.  02/15/15 02/15/16 Yes Historical Provider, MD  carvedilol (COREG) 3.125 MG tablet Take 3.125 mg by mouth 2 (two) times daily with a meal.   Yes Historical Provider, MD  furosemide (LASIX) 20 MG tablet Take 1 tablet (20 mg total) by mouth daily. 09/13/15  Yes Delma Freeze, FNP  glimepiride (AMARYL) 2 MG tablet Take 2 tablets by mouth 2 (two) times daily. 02/15/15  Yes Historical Provider, MD  losartan (COZAAR) 100 MG tablet Take 1 tablet by  mouth daily. 02/15/15  Yes Historical Provider, MD  metFORMIN (GLUCOPHAGE) 500 MG tablet Take 2 tablets by mouth 2 (two) times daily with a meal. 02/15/15  Yes Historical Provider, MD     Review of Systems  Constitutional: Negative for appetite change and fatigue.  HENT: Negative for congestion, postnasal drip and sore throat.   Eyes: Negative.   Respiratory: Negative for cough, chest tightness and shortness of breath.   Cardiovascular: Negative for chest pain, palpitations and leg swelling.  Gastrointestinal: Negative for abdominal distention and abdominal pain.  Endocrine: Negative.   Genitourinary: Negative.   Musculoskeletal: Negative for back pain and neck pain.  Skin: Negative.   Allergic/Immunologic: Negative.   Neurological: Negative for dizziness, weakness, light-headedness and headaches.  Hematological: Negative for adenopathy. Does not bruise/bleed easily.  Psychiatric/Behavioral: Negative for dysphoric mood and sleep disturbance (sleeping on 2 pillows). The patient is not nervous/anxious.        Objective:   Physical Exam  Constitutional: She is oriented to person, place, and time. She appears well-developed and well-nourished.  HENT:  Head: Normocephalic and atraumatic.  Eyes: Conjunctivae are normal. Pupils are equal, round, and reactive to light.  Neck: Normal range of motion. Neck supple.  Cardiovascular: Regular rhythm.  Tachycardia present.   Pulmonary/Chest: Effort normal. She has no wheezes. She has no rales.  Abdominal: Soft. She exhibits no distension. There is no tenderness.  Musculoskeletal: She exhibits no edema or tenderness.  Neurological: She is alert and oriented to person, place, and time.  Skin: Skin  is warm and dry.  Psychiatric: She has a normal mood and affect. Her behavior is normal. Thought content normal.  Nursing note and vitals reviewed.   BP 105/71   Pulse (!) 101   Resp 18   Ht 5\' 1"  (1.549 m)   Wt 170 lb (77.1 kg)   SpO2 100%    BMI 32.12 kg/m        Assessment & Plan:  1: Chronic heart failure with reduced ejection fraction- Patient presents without any fatigue, shortness of breath or swelling in her legs or abdomen (Class I). She is already weighing herself daily and says that her weight has been stable. Discussed the importance of calling for an overnight weight gain of >2 pounds or a weekly weight gain of >5 pounds. She is not adding any salt to her food and has begun reading food labels. Discussed the importance of following a 2000mg  sodium diet and written dietary information was given to her about that. Has recently seen her cardiologist. Could switch her to entresto if she develops HF symptoms as currently she doesn't qualify since she is asymptomatic.  2: Diabetes- She says that her glucose levels run from 160-165 and she's taking glimepiride and metformin. 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 She follows with her PCP regarding her diabetes and has recently seen him as well. 3: Tachycardia- If her heart rate remains elevated, discussed increasing her carvedilol dose.   Medication bottles were reviewed with the patient.  Return in 1 month or sooner for any questions/problems before then.

## 2015-09-13 NOTE — Patient Instructions (Signed)
Continue weighing daily and call for an overnight weight gain of > 2 pounds or a weekly weight gain of >5 pounds. 

## 2015-09-14 ENCOUNTER — Encounter: Payer: Self-pay | Admitting: Family

## 2015-09-14 DIAGNOSIS — E119 Type 2 diabetes mellitus without complications: Secondary | ICD-10-CM | POA: Insufficient documentation

## 2015-09-14 DIAGNOSIS — R000 Tachycardia, unspecified: Secondary | ICD-10-CM | POA: Insufficient documentation

## 2015-10-09 ENCOUNTER — Ambulatory Visit: Payer: BLUE CROSS/BLUE SHIELD | Attending: Family | Admitting: Family

## 2015-10-09 ENCOUNTER — Encounter: Payer: Self-pay | Admitting: Family

## 2015-10-09 VITALS — BP 117/72 | HR 88 | Resp 18 | Ht 61.0 in | Wt 166.0 lb

## 2015-10-09 DIAGNOSIS — Z79899 Other long term (current) drug therapy: Secondary | ICD-10-CM | POA: Insufficient documentation

## 2015-10-09 DIAGNOSIS — Z90710 Acquired absence of both cervix and uterus: Secondary | ICD-10-CM | POA: Diagnosis not present

## 2015-10-09 DIAGNOSIS — Z87891 Personal history of nicotine dependence: Secondary | ICD-10-CM | POA: Insufficient documentation

## 2015-10-09 DIAGNOSIS — I5022 Chronic systolic (congestive) heart failure: Secondary | ICD-10-CM

## 2015-10-09 DIAGNOSIS — Z8249 Family history of ischemic heart disease and other diseases of the circulatory system: Secondary | ICD-10-CM | POA: Diagnosis not present

## 2015-10-09 DIAGNOSIS — E119 Type 2 diabetes mellitus without complications: Secondary | ICD-10-CM | POA: Diagnosis not present

## 2015-10-09 DIAGNOSIS — R000 Tachycardia, unspecified: Secondary | ICD-10-CM | POA: Insufficient documentation

## 2015-10-09 DIAGNOSIS — Z8489 Family history of other specified conditions: Secondary | ICD-10-CM | POA: Diagnosis not present

## 2015-10-09 DIAGNOSIS — Z825 Family history of asthma and other chronic lower respiratory diseases: Secondary | ICD-10-CM | POA: Insufficient documentation

## 2015-10-09 DIAGNOSIS — Z832 Family history of diseases of the blood and blood-forming organs and certain disorders involving the immune mechanism: Secondary | ICD-10-CM | POA: Insufficient documentation

## 2015-10-09 DIAGNOSIS — I509 Heart failure, unspecified: Secondary | ICD-10-CM | POA: Diagnosis present

## 2015-10-09 NOTE — Progress Notes (Signed)
Subjective:    Patient ID: Taylor [name], female    DOB: [birth year], 50 y.o.   MRN: [id number]  Congestive Heart Failure  Presents for follow-up visit. The disease course has been improving. Associated symptoms include fatigue. Pertinent negatives include no abdominal pain, chest pain, edema, orthopnea, palpitations or shortness of breath. The symptoms have been improving. Past treatments include beta blockers, angiotensin receptor blockers and salt and fluid restriction. The treatment provided significant relief. Compliance with prior treatments has been good. Her past medical history is significant for DM. There is no history of chronic lung disease, CVA or HTN.  Other  This is a chronic (diabetes) problem. The current episode started more than 1 year ago. The problem occurs daily. The problem has been gradually improving. Associated symptoms include fatigue. Pertinent negatives include no abdominal pain, chest pain, congestion, coughing, neck pain, sore throat or visual change. Nothing aggravates the symptoms. Treatments tried: medication added. The treatment provided mild relief.   Past Medical History:  Diagnosis Date  . CHF (congestive heart failure) (HCC)   . Diabetes mellitus without complication [facility](HCC)     Past Surgical History:  Procedure Laterality Date  . ABDOMINAL HYSTERECTOMY      Family History  Problem Relation Age of Onset  . Anemia Neg Hx   . Arrhythmia Neg Hx   . Asthma Neg Hx   . Clotting disorder Neg Hx   . Fainting Neg Hx   . Heart attack Neg Hx   . Heart disease Neg Hx   . Heart failure Neg Hx   . Hyperlipidemia Neg Hx   . Hypertension Neg Hx     Social History  Substance Use Topics  . Smoking status: Former Games developermoker  . Smokeless tobacco: Never Used  . Alcohol use No     Comment: occ    No Known Allergies  Prior to Admission medications   Medication Sig Start Date End Date Taking? Authorizing Provider  atorvastatin (LIPITOR) 80 MG tablet Take 80 mg  by mouth daily.  02/15/15 02/15/16 Yes Historical Provider, MD  carvedilol (COREG) 6.25 MG tablet Take 6.25 mg by mouth 2 (two) times daily with a meal.   Yes Historical Provider, MD  empagliflozin (JARDIANCE) 10 MG TABS tablet Take 10 mg by mouth daily.   Yes Historical Provider, MD  furosemide (LASIX) 20 MG tablet Take 1 tablet (20 mg total) by mouth daily. 09/13/15  Yes Delma Freezeina A Jaquann Guarisco, FNP  glimepiride (AMARYL) 2 MG tablet Take 2 tablets by mouth 2 (two) times daily. 02/15/15  Yes Historical Provider, MD  losartan (COZAAR) 100 MG tablet Take 1 tablet by mouth daily. 02/15/15  Yes Historical Provider, MD  metFORMIN (GLUCOPHAGE) 500 MG tablet Take 2 tablets by mouth 2 (two) times daily with a meal. 02/15/15  Yes Historical Provider, MD      Review of Systems  Constitutional: Positive for fatigue. Negative for appetite change.  HENT: Negative for congestion, postnasal drip and sore throat.   Eyes: Negative.   Respiratory: Negative for cough, chest tightness and shortness of breath.   Cardiovascular: Negative for chest pain, palpitations and leg swelling.  Gastrointestinal: Negative for abdominal distention and abdominal pain.  Endocrine: Negative.   Genitourinary: Negative.   Musculoskeletal: Negative for back pain and neck pain.  Skin: Negative.   Allergic/Immunologic: Negative.   Neurological: Negative for dizziness and light-headedness.  Hematological: Negative for adenopathy. Does not bruise/bleed easily.  Psychiatric/Behavioral: Negative for dysphoric mood and sleep disturbance (sleeping on 2  pillows). The patient is not nervous/anxious.        Objective:   Physical Exam  Constitutional: She is oriented to person, place, and time. She appears well-developed and well-nourished.  HENT:  Head: Normocephalic and atraumatic.  Eyes: Conjunctivae are normal. Pupils are equal, round, and reactive to light.  Neck: Normal range of motion. Neck supple.  Cardiovascular: Normal rate and  regular rhythm.   Pulmonary/Chest: Effort normal. She has no wheezes. She has no rales.  Abdominal: Soft. She exhibits no distension. There is no tenderness.  Musculoskeletal: She exhibits no edema or tenderness.  Neurological: She is alert and oriented to person, place, and time.  Skin: Skin is warm and dry.  Psychiatric: She has a normal mood and affect. Her behavior is normal. Thought content normal.  Nursing note and vitals reviewed.   BP 117/72   Pulse 88   Resp 18   Ht 5\' 1"  (1.549 m)   Wt 166 lb (75.3 kg)   SpO2 100%   BMI 31.37 kg/m        Assessment & Plan:  1: Chronic heart failure with reduced ejection fraction- Patient presents with fatigue upon moderate exertion (Class II) although she feels like her energy level is improving. She denies any shortness of breath or swelling in her legs. She continues to weigh herself daily and says that she's lost some weight since she's increased her activity level. She is now walking at the track a few days out of the week. By our scale, she's lost 4 pounds since she was last here on 09/13/15. Reminded to call for an overnight weight gain of >2 pounds or a weekly weight gain of >5 pounds. She is not adding any salt to her food and is using Mrs. Dash seasoning. She's recently had her carvedilol adjusted and discussed changing her losartan to entresto but will wait for now. Brochure on entresto given to the patient for her review. [facility] and reviewed medications with the patient.  2: Diabetes- She's also recently had a medication added for her diabetes and says that her glucose has been running in the 160's. Returns to her PCP in November 2017.  3: Tachycardia- Heart rate is better since her carvedilol has been increased. Returns to her cardiologist in December 2017.   Medication list was reviewed with the patient.   Return here in 6 months or sooner for any questions/problems before then.

## 2015-10-09 NOTE — Patient Instructions (Signed)
Continue weighing daily and call for an overnight weight gain of > 2 pounds or a weekly weight gain of >5 pounds. 

## 2015-12-24 ENCOUNTER — Other Ambulatory Visit: Payer: Self-pay | Admitting: Internal Medicine

## 2015-12-24 DIAGNOSIS — Z1231 Encounter for screening mammogram for malignant neoplasm of breast: Secondary | ICD-10-CM

## 2015-12-25 ENCOUNTER — Ambulatory Visit
Admission: RE | Admit: 2015-12-25 | Discharge: 2015-12-25 | Disposition: A | Discharge status: Home / Self Care | Payer: BLUE CROSS/BLUE SHIELD | Source: Ambulatory Visit | Attending: Internal Medicine | Admitting: Internal Medicine

## 2015-12-25 DIAGNOSIS — Z1231 Encounter for screening mammogram for malignant neoplasm of breast: Secondary | ICD-10-CM | POA: Diagnosis present

## 2016-02-25 ENCOUNTER — Ambulatory Visit: Payer: BLUE CROSS/BLUE SHIELD | Attending: Family | Admitting: Family

## 2016-02-25 ENCOUNTER — Encounter: Payer: Self-pay | Admitting: Family

## 2016-02-25 VITALS — BP 133/73 | HR 93 | Resp 18 | Ht 61.0 in | Wt 168.0 lb

## 2016-02-25 DIAGNOSIS — Z7984 Long term (current) use of oral hypoglycemic drugs: Secondary | ICD-10-CM | POA: Insufficient documentation

## 2016-02-25 DIAGNOSIS — E119 Type 2 diabetes mellitus without complications: Secondary | ICD-10-CM | POA: Diagnosis not present

## 2016-02-25 DIAGNOSIS — R000 Tachycardia, unspecified: Secondary | ICD-10-CM

## 2016-02-25 DIAGNOSIS — Z87891 Personal history of nicotine dependence: Secondary | ICD-10-CM | POA: Insufficient documentation

## 2016-02-25 DIAGNOSIS — I5022 Chronic systolic (congestive) heart failure: Secondary | ICD-10-CM | POA: Insufficient documentation

## 2016-02-25 MED ORDER — FUROSEMIDE 20 MG PO TABS: 20.0000 mg | ORAL_TABLET | Freq: Every day | ORAL | 3 refills | Status: DC

## 2016-02-25 NOTE — Progress Notes (Signed)
Patient ID: Taylor [name], female    DOB: [birth year], 51 y.o.   MRN: [id number]  HPI Taylor [name] is a 51 y/o female with a history of diabetes, remote tobacco use and chronic heart failure.  Last echo was done 08/31/15 and showed an EF of 20% without valvular regurgitation.   Was last admitted on 08/30/15 with acute heart failure. She was IV diuresed and cardiology consult was obtained. Was discharged the following day.   She presents today for a follow-up visit without any fatigue, shortness of breath or weight gain. She denies any swelling in her legs/abdomen. She continues to weigh herself daily and says that her weight has been stable. 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   Past Medical History:  Diagnosis Date  . CHF (congestive heart failure) (HCC)   . Diabetes mellitus without complication [facility](HCC)    Past Surgical History:  Procedure Laterality Date  . ABDOMINAL HYSTERECTOMY     Family History  Problem Relation Age of Onset  . Anemia Neg Hx   . Arrhythmia Neg Hx   . Asthma Neg Hx   . Clotting disorder Neg Hx   . Fainting Neg Hx   . Heart attack Neg Hx   . Heart disease Neg Hx   . Heart failure Neg Hx   . Hyperlipidemia Neg Hx   . Hypertension Neg Hx    Social History  Substance Use Topics  . Smoking status: Former Games developermoker  . Smokeless tobacco: Never Used  . Alcohol use No     Comment: occ   No Known Allergies  Prior to Admission medications   Medication Sig Start Date End Date Taking? Authorizing Provider  carvedilol (COREG) 6.25 MG tablet Take 6.25 mg by mouth 2 (two) times daily with a meal.   Yes Historical Provider, MD  empagliflozin (JARDIANCE) 10 MG TABS tablet Take 10 mg by mouth daily.   Yes Historical Provider, MD  furosemide (LASIX) 20 MG tablet Take 1 tablet (20 mg total) by mouth daily. 02/25/16  Yes Delma Freezeina A Dyna Figuereo, FNP  glimepiride (AMARYL) 2 MG tablet Take 2 tablets by mouth 2 (two) times daily.  02/15/15  Yes Historical Provider, MD  losartan (COZAAR) 100 MG tablet Take 1 tablet by mouth daily. 02/15/15  Yes Historical Provider, MD  metFORMIN (GLUCOPHAGE) 500 MG tablet Take 2 tablets by mouth 2 (two) times daily with a meal. 02/15/15  Yes Historical Provider, MD    Review of Systems  Constitutional: Negative for appetite change and fatigue.  HENT: Negative for congestion, postnasal drip and sore throat.   Eyes: Negative.   Respiratory: Negative for chest tightness, shortness of breath and wheezing.   Cardiovascular: Negative for chest pain, palpitations and leg swelling.  Gastrointestinal: Negative for abdominal distention and abdominal pain.  Endocrine: Negative.   Genitourinary: Negative.   Musculoskeletal: Negative for back pain and neck pain.  Skin: Negative.   Allergic/Immunologic: Negative.   Neurological: Negative for dizziness and light-headedness.  Hematological: Negative for adenopathy. Does not bruise/bleed easily.  Psychiatric/Behavioral: Negative for dysphoric mood, sleep disturbance (sleeping on 2 pillows) and suicidal ideas. The patient is not nervous/anxious.    Vitals:   02/25/16 0859  BP: 133/73  Pulse: 93  Resp: 18  SpO2: 100%  Weight: 168 lb (76.2 kg)  Height: 5\' 1"  (1.549 m)   Wt Readings from Last 3 Encounters:  02/25/16 168 lb (76.2 kg)  10/09/15  166 lb (75.3 kg)  09/13/15 170 lb (77.1 kg)   Lab Results  Component Value Date   CREATININE 0.53 08/31/2015   CREATININE 0.67 08/30/2015   CREATININE 0.58 (L) 08/10/2012   Physical Exam  Constitutional: She is oriented to person, place, and time. She appears well-developed and well-nourished.  HENT:  Head: Normocephalic and atraumatic.  Eyes: Conjunctivae are normal. Pupils are equal, round, and reactive to light.  Neck: Normal range of motion. Neck supple. No JVD present.  Cardiovascular: Regular rhythm.  Tachycardia present.   Pulmonary/Chest: Effort normal. She has no wheezes. She has no  rales.  Abdominal: Soft. She exhibits no distension. There is no tenderness.  Musculoskeletal: She exhibits no edema or tenderness.  Neurological: She is alert and oriented to person, place, and time.  Skin: Skin is warm and dry.  Psychiatric: She has a normal mood and affect. Her behavior is normal. Thought content normal.  Nursing note and vitals reviewed.  Assessment & Plan:  1: Chronic heart failure with reduced ejection fraction- - NYHA class I - euvolemic today - weight essentially unchanged from previous weight. Reminded to call for an overnight weight gain of >2 pounds or a weekly weight gain of >5 pounds. - exercising by walking 3 days/week for 30 minutes at a time.  - has not received the flu vaccine and doesn't want it at this time - would like to change her losartan to entresto but she currently doesn't have any symptoms at this time - last saw cardiologist Gwen Pounds) 10/03/15 and is due to see him again today  2: Diabetes- - glucose this morning was 160 - last A1c was 8.9% - follows with PCP Dareen Piano). Last saw him 01/08/16 and returns on 05/06/16  3: Tachycardia- - HR in the 90's today - could increase carvedilol but she would like to wait at this time  Patient did not bring her medications nor a list. Each medication was verbally reviewed with the patient and she was encouraged to bring the bottles to every visit to confirm accuracy of list.  Return here in 6 months or sooner for any questions/problems before then.

## 2016-02-25 NOTE — Patient Instructions (Signed)
Continue weighing daily and call for an overnight weight gain of > 2 pounds or a weekly weight gain of >5 pounds. 

## 2016-08-21 ENCOUNTER — Emergency Department
Admission: EM | Admit: 2016-08-21 | Discharge: 2016-08-21 | Disposition: A | Discharge status: Home / Self Care | Payer: BLUE CROSS/BLUE SHIELD | Attending: Emergency Medicine | Admitting: Emergency Medicine

## 2016-08-21 ENCOUNTER — Emergency Department: Payer: BLUE CROSS/BLUE SHIELD

## 2016-08-21 DIAGNOSIS — Z79899 Other long term (current) drug therapy: Secondary | ICD-10-CM | POA: Insufficient documentation

## 2016-08-21 DIAGNOSIS — Z87891 Personal history of nicotine dependence: Secondary | ICD-10-CM | POA: Diagnosis not present

## 2016-08-21 DIAGNOSIS — M79645 Pain in left finger(s): Secondary | ICD-10-CM | POA: Diagnosis present

## 2016-08-21 DIAGNOSIS — E119 Type 2 diabetes mellitus without complications: Secondary | ICD-10-CM | POA: Insufficient documentation

## 2016-08-21 DIAGNOSIS — Z7984 Long term (current) use of oral hypoglycemic drugs: Secondary | ICD-10-CM | POA: Insufficient documentation

## 2016-08-21 DIAGNOSIS — I509 Heart failure, unspecified: Secondary | ICD-10-CM | POA: Insufficient documentation

## 2016-08-21 DIAGNOSIS — L03012 Cellulitis of left finger: Secondary | ICD-10-CM | POA: Diagnosis not present

## 2016-08-21 MED ORDER — ACETAMINOPHEN 325 MG PO TABS
650.0000 mg | ORAL_TABLET | Freq: Once | ORAL | Status: AC
  Administered 2016-08-21: 650 mg via ORAL
  Filled 2016-08-21: qty 2

## 2016-08-21 MED ORDER — SULFAMETHOXAZOLE-TRIMETHOPRIM 800-160 MG PO TABS
1.0000 | ORAL_TABLET | Freq: Once | ORAL | Status: AC
  Administered 2016-08-21: 1 via ORAL
  Filled 2016-08-21: qty 1

## 2016-08-21 MED ORDER — SULFAMETHOXAZOLE-TRIMETHOPRIM 800-160 MG PO TABS: 1.0000 | ORAL_TABLET | Freq: Two times a day (BID) | ORAL | 0 refills | Status: DC

## 2016-08-21 NOTE — ED Notes (Signed)
Pt c/o LFT index finger pain x2days, pt diabetic. Denies any injury

## 2016-08-21 NOTE — ED Triage Notes (Signed)
Pt states that her L index finger has been hurting for the past 2 days.  Pt denies taking anything for pain.  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  Pt states that she is a diabetic and that her BS has been running in the high 100s, but never over 200.  Pt states this is a little higher than her baseline.  Pt is A&Ox4, in NAD and ambulatory to triage.

## 2016-08-21 NOTE — ED Provider Notes (Signed)
[facility] Emergency Department Provider Note  ____________________________________________  Time seen: Approximately 10:02 PM  I have reviewed the triage vital signs and the nursing notes.   HISTORY  Chief Complaint Hand Pain (L pointer finger)   HPI Taylor EmmsLori A [name] is a 51 y.o. female who presents to the emergency department for evaluation of left index finger pain. No specific injury that she can recall. She states the pain started at the edge of the nail and is now around the side of her finger and cuticle. She states that she has a history of similar finger infection that required drainage. She has not taken any pain medications. She soaked her finger and alcohol earlier but did not have any relief.  Past Medical History:  Diagnosis Date  . CHF (congestive heart failure) (HCC)   . Diabetes mellitus without complication [facility](HCC)     Patient Active Problem List   Diagnosis Date Noted  . Diabetes (HCC) 09/14/2015  . Tachycardia 09/14/2015  . CHF (congestive heart failure) (HCC) 08/30/2015    Past Surgical History:  Procedure Laterality Date  . ABDOMINAL HYSTERECTOMY      Prior to Admission medications   Medication Sig Start Date End Date Taking? Authorizing Provider  carvedilol (COREG) 6.25 MG tablet Take 6.25 mg by mouth 2 (two) times daily with a meal.    [provider]  empagliflozin (JARDIANCE) 10 MG TABS tablet Take 10 mg by mouth daily.    [provider]  furosemide (LASIX) 20 MG tablet Take 1 tablet (20 mg total) by mouth daily. 02/25/16   Delma FreezeHackney, Tina A, FNP  glimepiride (AMARYL) 2 MG tablet Take 2 tablets by mouth 2 (two) times daily. 02/15/15   [provider]  losartan (COZAAR) 100 MG tablet Take 1 tablet by mouth daily. 02/15/15   [provider]  metFORMIN (GLUCOPHAGE) 500 MG tablet Take 2 tablets by mouth 2 (two) times daily with a meal. 02/15/15   [provider]  sulfamethoxazole-trimethoprim  (BACTRIM DS,SEPTRA DS) 800-160 MG tablet Take 1 tablet by mouth 2 (two) times daily. 08/21/16   Chinita Pesterriplett, Ngozi Alvidrez B, FNP    Allergies Patient has no known allergies.  Family History  Problem Relation Age of Onset  . Anemia Neg Hx   . Arrhythmia Neg Hx   . Asthma Neg Hx   . Clotting disorder Neg Hx   . Fainting Neg Hx   . Heart attack Neg Hx   . Heart disease Neg Hx   . Heart failure Neg Hx   . Hyperlipidemia Neg Hx   . Hypertension Neg Hx     Social History Social History  Substance Use Topics  . Smoking status: Former Games developermoker  . Smokeless tobacco: Never Used  . Alcohol use No     Comment: occ    Review of Systems  Constitutional: Negative for fever  Respiratory: Negative for cough or shortness of breath.  Musculoskeletal: Negative for decrease in range of motion, specifically of the left index finger.  Skin: Positive for tenderness of the tip of the left index finger. Neurological: Negative for decrease in sensation or motor function, specifically of the left index finger or hand. ____________________________________________   PHYSICAL EXAM:  VITAL SIGNS: ED Triage Vitals  Enc Vitals Group     BP 08/21/16 2058 (!) 148/77     Pulse Rate 08/21/16 2058 99     Resp 08/21/16 2058 18     Temp 08/21/16 2058 97.9 F (36.6 C)  Temp Source 08/21/16 2058 Oral     SpO2 08/21/16 2058 97 %     Weight 08/21/16 2058 163 lb (73.9 kg)     Height 08/21/16 2058 5\' 1"  (1.549 m)     Head Circumference --      Peak Flow --      Pain Score 08/21/16 2057 5     Pain Loc --      Pain Edu? --      Excl. in GC? --      Constitutional: Well appearing. Eyes: Diffuse conjunctival erythema without injection or drainage is present. Nose: No rhinorrhea. Mouth/Throat: Is patent. Speech is clear. Neck: Active, full range of motion.  Cardiovascular: 2+ radial pulses bilaterally. Capillary refill is less than 2 seconds Respiratory: Respirations are even and unlabored.. Musculoskeletal:  Full, active range of motion of the left hand, specifically the left index finger. Neurologic: Sharp and dull sensation is intact over the distal tip of the left index finger. Skin:  Mildly indurated area along the lateral and proximal edge of the cuticle of the left index finger. No fluctuance. No induration or tenderness of the pad of the index finger. No obvious drainage.  ____________________________________________   LABS (all labs ordered are listed, but only abnormal results are displayed)  Labs Reviewed - No data to display ____________________________________________  EKG  Not indicated ____________________________________________  RADIOLOGY  Not indicated ____________________________________________   PROCEDURES  Procedure(s) performed: Not indicated ____________________________________________   INITIAL IMPRESSION / ASSESSMENT AND PLAN / ED COURSE  Taylor [name] is a 51 y.o. female who presents to the emergency department for evaluation and treatment of symptoms consistent with the left index finger. No significant fluctuance is felt. She will be treated with Bactrim and advised to soak the hand in warm Epson salt about 4 times per day. She was encouraged to take Tylenol every 4 hours if needed for pain. She was instructed to follow-up with her primary care provider for any symptom that is not improving over the next 2 days. She was advised to return to the emergency department for symptoms that change or worsen if she is unable schedule an appointment.  Pertinent labs & imaging results that were available during my care of the patient were reviewed by me and considered in my medical decision making (see chart for details). ____________________________________________   FINAL CLINICAL IMPRESSION(S) / ED DIAGNOSES  Final diagnoses:  Paronychia of left index finger    New Prescriptions   SULFAMETHOXAZOLE-TRIMETHOPRIM (BACTRIM DS,SEPTRA DS) 800-160 MG TABLET     Take 1 tablet by mouth 2 (two) times daily.    If controlled substance prescribed during this visit, 12 month history viewed on the [facility] prior to issuing an initial prescription for Schedule II or III opiod.   Note:  This document was prepared using Dragon voice recognition software and may include unintentional dictation errors.    Chinita Pester, FNP 08/21/16 2207    Arnaldo Natal, MD 08/22/16 [id number]

## 2016-08-21 NOTE — Discharge Instructions (Signed)
See your primary care doctor in 2 days if not any better. Soak in warm [employer] Salt water about 4 times per day. Return to the ER for symptoms that change or worsen if unable to schedule an appointment.

## 2016-08-25 ENCOUNTER — Ambulatory Visit: Payer: BLUE CROSS/BLUE SHIELD | Admitting: Family

## 2016-09-02 ENCOUNTER — Encounter: Payer: Self-pay | Admitting: Family

## 2016-09-02 ENCOUNTER — Ambulatory Visit: Payer: BLUE CROSS/BLUE SHIELD | Attending: Family | Admitting: Family

## 2016-09-02 VITALS — BP 138/73 | HR 97 | Resp 20 | Ht 61.0 in | Wt 158.0 lb

## 2016-09-02 DIAGNOSIS — I5022 Chronic systolic (congestive) heart failure: Secondary | ICD-10-CM | POA: Diagnosis not present

## 2016-09-02 DIAGNOSIS — G4733 Obstructive sleep apnea (adult) (pediatric): Secondary | ICD-10-CM | POA: Diagnosis not present

## 2016-09-02 DIAGNOSIS — Z87891 Personal history of nicotine dependence: Secondary | ICD-10-CM | POA: Diagnosis not present

## 2016-09-02 DIAGNOSIS — Z90710 Acquired absence of both cervix and uterus: Secondary | ICD-10-CM | POA: Insufficient documentation

## 2016-09-02 DIAGNOSIS — Z79899 Other long term (current) drug therapy: Secondary | ICD-10-CM | POA: Insufficient documentation

## 2016-09-02 DIAGNOSIS — Z7984 Long term (current) use of oral hypoglycemic drugs: Secondary | ICD-10-CM | POA: Diagnosis not present

## 2016-09-02 DIAGNOSIS — R000 Tachycardia, unspecified: Secondary | ICD-10-CM | POA: Diagnosis not present

## 2016-09-02 DIAGNOSIS — E119 Type 2 diabetes mellitus without complications: Secondary | ICD-10-CM | POA: Insufficient documentation

## 2016-09-02 MED ORDER — CARVEDILOL 6.25 MG PO TABS: 6.2500 mg | ORAL_TABLET | Freq: Two times a day (BID) | ORAL | 3 refills | Status: DC

## 2016-09-02 MED ORDER — FUROSEMIDE 20 MG PO TABS: 20.0000 mg | ORAL_TABLET | Freq: Every day | ORAL | 3 refills | Status: DC

## 2016-09-02 NOTE — Patient Instructions (Signed)
Continue weighing daily and call for an overnight weight gain of > 2 pounds or a weekly weight gain of >5 pounds. 

## 2016-09-02 NOTE — Progress Notes (Signed)
Patient ID: Taylor [name], female    DOB: 1965-09-02, 51 y.o.   MRN: [id number]  HPI Taylor [name] is a 51 y/o female with a history of diabetes, obstructive sleep apnea, remote tobacco use and chronic heart failure.  Last echo was done 08/31/15 and showed an EF of 20% without valvular regurgitation.   Was in the ED 08/21/16 due to paronychia of left index finger. Treated and released. Was last admitted on 08/30/15 with acute heart failure. She was IV diuresed and cardiology consult was obtained. Was discharged the following day.   She presents today with a chief complaint of a follow-up visit. She currently denies any fatigue, shortness of breath, chest pain, edema or weight gain.   Past Medical History:  Diagnosis Date  . CHF (congestive heart failure) (HCC)   . Diabetes mellitus without complication [facility](HCC)    Past Surgical History:  Procedure Laterality Date  . ABDOMINAL HYSTERECTOMY     Family History  Problem Relation Age of Onset  . Anemia Neg Hx   . Arrhythmia Neg Hx   . Asthma Neg Hx   . Clotting disorder Neg Hx   . Fainting Neg Hx   . Heart attack Neg Hx   . Heart disease Neg Hx   . Heart failure Neg Hx   . Hyperlipidemia Neg Hx   . Hypertension Neg Hx    Social History  Substance Use Topics  . Smoking status: Former Games developermoker  . Smokeless tobacco: Never Used  . Alcohol use No     Comment: occ   No Known Allergies  Prior to Admission medications   Medication Sig Start Date End Date Taking? Authorizing Provider  carvedilol (COREG) 6.25 MG tablet Take 1 tablet (6.25 mg total) by mouth 2 (two) times daily with a meal. 09/02/16  Yes Chrysa Rampy A, FNP  empagliflozin (JARDIANCE) 10 MG TABS tablet Take 10 mg by mouth daily.   Yes [provider]  furosemide (LASIX) 20 MG tablet Take 1 tablet (20 mg total) by mouth daily. 09/02/16  Yes Ryann Leavitt A, FNP  glimepiride (AMARYL) 2 MG tablet Take 2 tablets by mouth 2 (two) times daily. 02/15/15  Yes [provider]   losartan (COZAAR) 100 MG tablet Take 1 tablet by mouth daily. 02/15/15  Yes [provider]  metFORMIN (GLUCOPHAGE) 500 MG tablet Take 2 tablets by mouth 2 (two) times daily with a meal. 02/15/15  Yes [provider]  sulfamethoxazole-trimethoprim (BACTRIM DS,SEPTRA DS) 800-160 MG tablet Take 1 tablet by mouth 2 (two) times daily. 08/21/16  Yes Triplett, Cari B, FNP     Review of Systems  Constitutional: Negative for appetite change and fatigue.  HENT: Negative for congestion, postnasal drip and sore throat.   Eyes: Negative.   Respiratory: Negative for chest tightness, shortness of breath and wheezing.   Cardiovascular: Negative for chest pain, palpitations and leg swelling.  Gastrointestinal: Negative for abdominal distention and abdominal pain.  Endocrine: Negative.   Genitourinary: Negative.   Musculoskeletal: Negative for back pain and neck pain.  Skin: Negative.   Allergic/Immunologic: Negative.   Neurological: Negative for dizziness and light-headedness.  Hematological: Negative for adenopathy. Does not bruise/bleed easily.  Psychiatric/Behavioral: Negative for dysphoric mood, sleep disturbance (sleeping on 2 pillows; wearing CPAP at night) and suicidal ideas. The patient is not nervous/anxious.    Vitals:   09/02/16 0917  BP: 138/73  Pulse: 97  Resp: 20  SpO2: 100%  Weight: 158 lb (71.7 kg)  Height:  5\' 1"  (1.549 m)   Wt Readings from Last 3 Encounters:  09/02/16 158 lb (71.7 kg)  08/21/16 163 lb (73.9 kg)  02/25/16 168 lb (76.2 kg)    Lab Results  Component Value Date   CREATININE 0.53 08/31/2015   CREATININE 0.67 08/30/2015   CREATININE 0.58 (L) 08/10/2012   Physical Exam  Constitutional: She is oriented to person, place, and time. She appears well-developed and well-nourished.  HENT:  Head: Normocephalic and atraumatic.  Neck: Normal range of motion. Neck supple. No JVD present.  Cardiovascular: Regular rhythm.  Tachycardia present.    Pulmonary/Chest: Effort normal. She has no wheezes. She has no rales.  Abdominal: Soft. She exhibits no distension. There is no tenderness.  Musculoskeletal: She exhibits no edema or tenderness.  Neurological: She is alert and oriented to person, place, and time.  Skin: Skin is warm and dry.  Psychiatric: She has a normal mood and affect. Her behavior is normal. Thought content normal.  Nursing note and vitals reviewed.  Assessment & Plan:  1: Chronic heart failure with reduced ejection fraction- - NYHA class I - euvolemic today - weight down 10 pounds from previous visit. Reminded to call for an overnight weight gain of >2 pounds or a weekly weight gain of >5 pounds. - exercising by walking 3 days/week for 30 minutes at a time.  - would like to change her losartan to entresto but she currently doesn't have any symptoms at this time - last saw cardiologist Gwen Pounds) 10/03/15; says that she hasn't returned to him because she can't afford the $100 co-pay as she's trying to pay off her hospital bills  2: Diabetes- - glucose this morning was 160 - last A1c was 10.1% on 07/31/16 which is up from 8.9% a year ago - follows with PCP Dareen Piano). Last saw him 08/04/16 and returns October 2018  3: Tachycardia- - HR in the 90's today - could increase carvedilol but she would like to continue to wait at this time  4: Obstructive sleep apnea- - wearing CPAP on a nightly basis - reports sleeping better since wearing it  Patient did not bring her medications nor a list. Each medication was verbally reviewed with the patient and she was encouraged to bring the bottles to every visit to confirm accuracy of list.  Return here in 4 months or sooner for any questions/problems before then.

## 2016-09-03 ENCOUNTER — Encounter: Payer: Self-pay | Admitting: Family

## 2016-09-03 DIAGNOSIS — G4733 Obstructive sleep apnea (adult) (pediatric): Secondary | ICD-10-CM | POA: Insufficient documentation

## 2016-10-14 ENCOUNTER — Telehealth: Payer: Self-pay

## 2016-10-14 NOTE — Telephone Encounter (Signed)
Pt would like to speak with you about getting on disability. Please contact patient.

## 2016-10-14 NOTE — Telephone Encounter (Signed)
Attempted to return patient's call regarding disability. Voicemail has not been set up yet and patient did not answer her phone. Since at most recent office visit, patient did not have any symptoms, unlikely she would qualify for disability.

## 2016-10-15 ENCOUNTER — Telehealth: Payer: Self-pay | Admitting: Family

## 2016-10-15 NOTE — Telephone Encounter (Signed)
Spoke with patient regarding her question about disability. She says that she is "so tired all of the time" and doesn't feel like she can work anymore. Denies any chest pain or shortness of breath. Advised her that I felt like she wouldn't qualify for disability based on that symptom alone but that was not my area of expertise.   She says that she sees her PCP in October 2018 and she was encouraged to either call him and leave a message about this or make sure she speaks with him about this at her next appointment. She says that she'll wait and talk with him in person about this.

## 2016-12-30 ENCOUNTER — Ambulatory Visit: Payer: BLUE CROSS/BLUE SHIELD | Admitting: Family

## 2016-12-31 ENCOUNTER — Encounter: Payer: Self-pay | Admitting: Family

## 2016-12-31 ENCOUNTER — Other Ambulatory Visit: Payer: Self-pay

## 2016-12-31 ENCOUNTER — Ambulatory Visit: Payer: BLUE CROSS/BLUE SHIELD | Attending: Family | Admitting: Family

## 2016-12-31 VITALS — BP 137/79 | HR 96 | Resp 18 | Ht 61.0 in | Wt 154.0 lb

## 2016-12-31 DIAGNOSIS — E119 Type 2 diabetes mellitus without complications: Secondary | ICD-10-CM | POA: Diagnosis not present

## 2016-12-31 DIAGNOSIS — Z79899 Other long term (current) drug therapy: Secondary | ICD-10-CM | POA: Diagnosis not present

## 2016-12-31 DIAGNOSIS — Z90710 Acquired absence of both cervix and uterus: Secondary | ICD-10-CM | POA: Diagnosis not present

## 2016-12-31 DIAGNOSIS — R000 Tachycardia, unspecified: Secondary | ICD-10-CM | POA: Diagnosis not present

## 2016-12-31 DIAGNOSIS — Y9301 Activity, walking, marching and hiking: Secondary | ICD-10-CM | POA: Diagnosis not present

## 2016-12-31 DIAGNOSIS — I5022 Chronic systolic (congestive) heart failure: Secondary | ICD-10-CM | POA: Insufficient documentation

## 2016-12-31 DIAGNOSIS — Z87891 Personal history of nicotine dependence: Secondary | ICD-10-CM | POA: Diagnosis not present

## 2016-12-31 DIAGNOSIS — Z794 Long term (current) use of insulin: Secondary | ICD-10-CM | POA: Diagnosis not present

## 2016-12-31 DIAGNOSIS — G4733 Obstructive sleep apnea (adult) (pediatric): Secondary | ICD-10-CM | POA: Insufficient documentation

## 2016-12-31 MED ORDER — METFORMIN HCL 1000 MG PO TABS: 1000.0000 mg | ORAL_TABLET | Freq: Two times a day (BID) | ORAL | 3 refills | Status: AC

## 2016-12-31 NOTE — Progress Notes (Signed)
Patient ID: Taylor [name], female    DOB: 05-Aug-1965, 51 y.o.   MRN: [id number]  HPI [name] is a 51 y/o female with a history of diabetes, obstructive sleep apnea, remote tobacco use and chronic heart failure.  Last echo was done 08/31/15 and showed an EF of 20% without valvular regurgitation.   Was in the ED 08/21/16 due to paronychia of left index finger. Treated and released.   She presents today with a chief complaint of a follow-up visit. She currently denies any fatigue, shortness of breath, chest pain, edema or weight gain.   Past Medical History:  Diagnosis Date  . CHF (congestive heart failure) (HCC)   . Diabetes mellitus without complication (HCC)   . Obstructive sleep apnea    Past Surgical History:  Procedure Laterality Date  . ABDOMINAL HYSTERECTOMY     Family History  Problem Relation Age of Onset  . Anemia Neg Hx   . Arrhythmia Neg Hx   . Asthma Neg Hx   . Clotting disorder Neg Hx   . Fainting Neg Hx   . Heart attack Neg Hx   . Heart disease Neg Hx   . Heart failure Neg Hx   . Hyperlipidemia Neg Hx   . Hypertension Neg Hx    Social History   Tobacco Use  . Smoking status: Former Games developermoker  . Smokeless tobacco: Never Used  Substance Use Topics  . Alcohol use: No    Comment: occ   No Known Allergies  Prior to Admission medications   Medication Sig Start Date End Date Taking? Authorizing Provider  carvedilol (COREG) 6.25 MG tablet Take 1 tablet (6.25 mg total) by mouth 2 (two) times daily with a meal. 09/02/16  Yes Hackney, Tina A, FNP  empagliflozin (JARDIANCE) 10 MG TABS tablet Take 10 mg by mouth daily.   Yes [provider]  furosemide (LASIX) 20 MG tablet Take 1 tablet (20 mg total) by mouth daily. 09/02/16  Yes Hackney, Tina A, FNP  glimepiride (AMARYL) 2 MG tablet Take 2 tablets by mouth 2 (two) times daily. 02/15/15  Yes [provider]  losartan (COZAAR) 100 MG tablet Take 1 tablet by mouth daily. 02/15/15  Yes [provider]  metFORMIN (GLUCOPHAGE) 500 MG tablet Take 2 tablets by mouth 2 (two) times daily with a meal. 02/15/15  Yes [provider]  sulfamethoxazole-trimethoprim (BACTRIM DS,SEPTRA DS) 800-160 MG tablet Take 1 tablet by mouth 2 (two) times daily. 08/21/16  Yes Triplett, Cari B, FNP    Review of Systems  Constitutional: Negative for appetite change and fatigue.  HENT: Negative for congestion, postnasal drip and sore throat.   Eyes: Negative.   Respiratory: Negative for chest tightness, shortness of breath and wheezing.   Cardiovascular: Negative for chest pain, palpitations and leg swelling.  Gastrointestinal: Negative for abdominal distention and abdominal pain.  Endocrine: Negative.   Genitourinary: Negative.   Musculoskeletal: Negative for back pain and neck pain.  Skin: Negative.   Allergic/Immunologic: Negative.   Neurological: Negative for dizziness and light-headedness.  Hematological: Negative for adenopathy. Does not bruise/bleed easily.  Psychiatric/Behavioral: Negative for dysphoric mood, sleep disturbance (sleeping on 2 pillows; wearing CPAP at night) and suicidal ideas. The patient is not nervous/anxious.    Vitals:   12/31/16 0911  BP: 137/79  Pulse: 96  Resp: 18  SpO2: 98%  Weight: 154 lb (69.9 kg)  Height: 5\' 1"  (1.549 m)   Wt Readings from Last 3 Encounters:  12/31/16 154  lb (69.9 kg)  09/02/16 158 lb (71.7 kg)  08/21/16 163 lb (73.9 kg)    Lab Results  Component Value Date   CREATININE 0.53 08/31/2015   CREATININE 0.67 08/30/2015   CREATININE 0.58 (L) 08/10/2012   Physical Exam  Constitutional: She is oriented to person, place, and time. She appears well-developed and well-nourished.  HENT:  Head: Normocephalic and atraumatic.  Neck: Normal range of motion. Neck supple. No JVD present.  Cardiovascular: Regular rhythm. Tachycardia present.  Pulmonary/Chest: Effort normal. She has no wheezes. She has no rales.  Abdominal: Soft. She exhibits no  distension. There is no tenderness.  Musculoskeletal: She exhibits no edema or tenderness.  Neurological: She is alert and oriented to person, place, and time.  Skin: Skin is warm and dry.  Psychiatric: She has a normal mood and affect. Her behavior is normal. Thought content normal.  Nursing note and vitals reviewed.  Assessment & Plan:  1: Chronic heart failure with reduced ejection fraction- - NYHA class I - euvolemic today - weight down 4 [name] from previous visit. Reminded to call for an overnight weight gain of >2 [name] or a weekly weight gain of >5 [name]. - exercising by walking 3 days/week for 30 minutes at a time.  - would like to change her losartan to entresto but she currently doesn't have any symptoms at this time - last saw cardiologist Taylor [name](Taylor [name]) 10/03/15; says that she hasn't returned to him because she can't afford the $100 co-pay as she's trying to pay off her hospital bills  2: Diabetes- - last A1c was 11.0% on 11/27/16 - saw PCP Taylor [name](Taylor [name]) 12/04/16 and was ordered insulin but she hasn't picked it up yet because her pharmacy told her that her copay would be ~$200 and she says that she can't afford that; advised her to call her PCP back to inform him of this - has only been taking 500mg  metformin BID and she was instructed that she should be taking 1000mg  twice daily. New RX sent in for her about this  3: Tachycardia- - HR in the 90's today - she is still not interested in titrating up carvedilol at this time  4: Obstructive sleep apnea- - wearing CPAP on a nightly basis - reports sleeping well and waking up feeling rested  Patient did not bring her medications nor a list. Each medication was verbally reviewed with the patient and she was encouraged to bring the bottles to every visit to confirm accuracy of list.  Return here in 6 months or sooner for any questions/problems before then.

## 2016-12-31 NOTE — Patient Instructions (Signed)
Continue weighing daily and call for an overnight weight gain of > 2 pounds or a weekly weight gain of >5 pounds. 

## 2017-06-28 NOTE — Progress Notes (Signed)
Patient ID: Taylor [name], female    DOB: Jul 23, 1965, 52 y.o.   MRN: [id number]  HPI Taylor [name] is a 52 y/o female with a history of diabetes, obstructive sleep apnea, remote tobacco use and chronic heart failure.  Last echo was done 08/31/15 and showed an EF of 20% without valvular regurgitation.   Has not been admitted or been in the ED in the last 6 months.   She presents today for a follow-up visit with a chief complaint of minimal fatigue upon moderate exertion. She describes this as occurring more often on her work days. She denies any difficulty sleeping, abdominal distention, palpitations, pedal edema, chest pain, shortness of breath or dizziness.   Past Medical History:  Diagnosis Date  . CHF (congestive heart failure) (HCC)   . Diabetes mellitus without complication (HCC)   . Obstructive sleep apnea    Past Surgical History:  Procedure Laterality Date  . ABDOMINAL HYSTERECTOMY     Family History  Problem Relation Age of Onset  . Anemia Neg Hx   . Arrhythmia Neg Hx   . Asthma Neg Hx   . Clotting disorder Neg Hx   . Fainting Neg Hx   . Heart attack Neg Hx   . Heart disease Neg Hx   . Heart failure Neg Hx   . Hyperlipidemia Neg Hx   . Hypertension Neg Hx    Social History   Tobacco Use  . Smoking status: Former Games developer  . Smokeless tobacco: Never Used  Substance Use Topics  . Alcohol use: No    Comment: occ   No Known Allergies  Prior to Admission medications   Medication Sig Start Date End Date Taking? Authorizing Provider  atorvastatin (LIPITOR) 40 MG tablet Take 40 mg by mouth daily. 06/29/17  Yes [provider]  carvedilol (COREG) 6.25 MG tablet Take 1 tablet (6.25 mg total) by mouth 2 (two) times daily with a meal. 09/02/16  Yes Danell Vazquez A, FNP  empagliflozin (JARDIANCE) 10 MG TABS tablet Take 10 mg by mouth daily.   Yes [provider]  furosemide (LASIX) 20 MG tablet Take 1 tablet (20 mg total) by mouth daily. 09/02/16  Yes Nataliee Shurtz,  Joie Reamer A, FNP  glimepiride (AMARYL) 2 MG tablet Take 2 tablets by mouth 2 (two) times daily. 02/15/15  Yes [provider]  Insulin Detemir (LEVEMIR FLEXTOUCH) 100 UNIT/ML Pen Inject 25 Units into the skin at bedtime. 04/23/17 04/23/18 Yes [provider]  losartan (COZAAR) 100 MG tablet Take 1 tablet by mouth daily. 02/15/15  Yes [provider]  metFORMIN (GLUCOPHAGE) 1000 MG tablet Take 1 tablet (1,000 mg total) 2 (two) times daily with a meal by mouth. 12/31/16  Yes Delma Freeze, FNP    Review of Systems  Constitutional: Positive for fatigue. Negative for appetite change.  HENT: Negative for congestion, postnasal drip and sore throat.   Eyes: Negative.   Respiratory: Negative for chest tightness, shortness of breath and wheezing.   Cardiovascular: Negative for chest pain, palpitations and leg swelling.  Gastrointestinal: Negative for abdominal distention and abdominal pain.  Endocrine: Negative.   Genitourinary: Negative.   Musculoskeletal: Negative for back pain and neck pain.  Skin: Negative.   Allergic/Immunologic: Negative.   Neurological: Negative for dizziness and light-headedness.  Hematological: Negative for adenopathy. Does not bruise/bleed easily.  Psychiatric/Behavioral: Negative for dysphoric mood, sleep disturbance (sleeping on 2 pillows; wearing CPAP at night) and suicidal ideas. The patient is not nervous/anxious.  Vitals:   07/01/17 0903  BP: (!) 146/93  Pulse: 98  Resp: 18  SpO2: 100%  Weight: 158 lb 8 oz (71.9 kg)  Height:  (1.549 m)   Wt Readings from Last 3 Encounters:  07/01/17 158 lb 8 oz (71.9 kg)  12/31/16 154 lb (69.9 kg)  09/02/16 158 lb (71.7 kg)   Lab Results  Component Value Date   CREATININE 0.53 08/31/2015   CREATININE 0.67 08/30/2015   CREATININE 0.58 (L) 08/10/2012    Physical Exam  Constitutional: She is oriented to person, place, and time. She appears well-developed and well-nourished.  HENT:  Head:  Normocephalic and atraumatic.  Neck: Normal range of motion. Neck supple. No JVD present.  Cardiovascular: Regular rhythm. Tachycardia present.  Pulmonary/Chest: Effort normal. She has no wheezes. She has no rales.  Abdominal: Soft. She exhibits no distension. There is no tenderness.  Musculoskeletal: She exhibits no edema or tenderness.  Neurological: She is alert and oriented to person, place, and time.  Skin: Skin is warm and dry.  Psychiatric: She has a normal mood and affect. Her behavior is normal. Thought content normal.  Nursing note and vitals reviewed.  Assessment & Plan:  1: Chronic heart failure with reduced ejection fraction- - NYHA class II - euvolemic today - Reminded to call for an overnight weight gain of >2 pounds or a weekly weight gain of >5 pounds. - weight up 4 pounds since she was last here 6 months ago - exercising by walking 3 days/week for 30 minutes at a time.  - discussed changing her losartan to entresto and she is going to check with her pharmacy what the cost to her will be - last saw cardiologist Gwen Pounds) 10/03/15; says that she hasn't returned to him because she can't afford the $100 co-pay as she's trying to pay off her hospital bills - BMP on 02/26/17 reviewed and showed sodium 142, potassium 4.5 and GFR 127 - last echo done 2017 so another one was scheduled for 07/08/17  2: Diabetes- - last A1c was 12.1% on 02/26/17 - glucose at home this morning was 168 - sees PCP Dareen Piano) next month  3: Tachycardia- - HR continues in the upper 90's today - will increase her carvedilol to 12.5mg  BID. She was instructed to finish out her current bottle by taking 2 of the 6.25mg  tablets twice daily until gone and then will send a new RX of 12.5mg  tablets  4: Obstructive sleep apnea- - wearing CPAP on a nightly basis - reports sleeping well and waking up feeling rested  Patient did not bring her medications nor a list. Each medication was verbally reviewed with  the patient and she was encouraged to bring the bottles to every visit to confirm accuracy of list.  Return in 1 month or sooner for any questions/problems before then.

## 2017-06-30 ENCOUNTER — Ambulatory Visit: Payer: BLUE CROSS/BLUE SHIELD | Admitting: Family

## 2017-07-01 ENCOUNTER — Ambulatory Visit: Payer: BLUE CROSS/BLUE SHIELD | Attending: Family | Admitting: Family

## 2017-07-01 ENCOUNTER — Encounter: Payer: Self-pay | Admitting: Family

## 2017-07-01 VITALS — BP 146/93 | HR 98 | Resp 18 | Ht 61.0 in | Wt 158.5 lb

## 2017-07-01 DIAGNOSIS — E119 Type 2 diabetes mellitus without complications: Secondary | ICD-10-CM

## 2017-07-01 DIAGNOSIS — I5022 Chronic systolic (congestive) heart failure: Secondary | ICD-10-CM

## 2017-07-01 DIAGNOSIS — Z90710 Acquired absence of both cervix and uterus: Secondary | ICD-10-CM | POA: Insufficient documentation

## 2017-07-01 DIAGNOSIS — Z794 Long term (current) use of insulin: Secondary | ICD-10-CM | POA: Diagnosis not present

## 2017-07-01 DIAGNOSIS — G4733 Obstructive sleep apnea (adult) (pediatric): Secondary | ICD-10-CM

## 2017-07-01 DIAGNOSIS — Z87891 Personal history of nicotine dependence: Secondary | ICD-10-CM | POA: Insufficient documentation

## 2017-07-01 DIAGNOSIS — Z79899 Other long term (current) drug therapy: Secondary | ICD-10-CM | POA: Diagnosis not present

## 2017-07-01 DIAGNOSIS — I509 Heart failure, unspecified: Secondary | ICD-10-CM | POA: Diagnosis present

## 2017-07-01 DIAGNOSIS — R000 Tachycardia, unspecified: Secondary | ICD-10-CM

## 2017-07-01 NOTE — Patient Instructions (Addendum)
Resume weighing daily and call for an overnight weight gain of > 2 pounds or a weekly weight gain of >5 pounds.  Increase carvedilol to 2 tablets twice daily until you use up what you have. When you have about a week's worth left, call me and I'll send in a new prescription for the 12.5mg  dosage.   Check with pharmacy to see how much Sherryll Burger would cost with your insurance

## 2017-07-08 ENCOUNTER — Ambulatory Visit
Admission: RE | Admit: 2017-07-08 | Discharge: 2017-07-08 | Disposition: A | Discharge status: Home / Self Care | Payer: BLUE CROSS/BLUE SHIELD | Source: Ambulatory Visit | Attending: Family | Admitting: Family

## 2017-07-08 DIAGNOSIS — E119 Type 2 diabetes mellitus without complications: Secondary | ICD-10-CM | POA: Insufficient documentation

## 2017-07-08 DIAGNOSIS — I5022 Chronic systolic (congestive) heart failure: Secondary | ICD-10-CM | POA: Diagnosis present

## 2017-07-08 DIAGNOSIS — G4733 Obstructive sleep apnea (adult) (pediatric): Secondary | ICD-10-CM | POA: Diagnosis not present

## 2017-07-08 DIAGNOSIS — I340 Nonrheumatic mitral (valve) insufficiency: Secondary | ICD-10-CM | POA: Insufficient documentation

## 2017-07-08 NOTE — Progress Notes (Signed)
*  PRELIMINARY RESULTS* Echocardiogram 2D Echocardiogram has been performed.  Cristela Blue 07/08/2017, 11:27 AM

## 2017-09-02 ENCOUNTER — Ambulatory Visit: Payer: BLUE CROSS/BLUE SHIELD | Attending: Family | Admitting: Family

## 2017-09-02 ENCOUNTER — Encounter: Payer: Self-pay | Admitting: Family

## 2017-09-02 VITALS — BP 137/93 | HR 110 | Resp 18 | Ht 61.0 in | Wt 149.0 lb

## 2017-09-02 DIAGNOSIS — R5383 Other fatigue: Secondary | ICD-10-CM | POA: Diagnosis present

## 2017-09-02 DIAGNOSIS — R000 Tachycardia, unspecified: Secondary | ICD-10-CM | POA: Diagnosis not present

## 2017-09-02 DIAGNOSIS — Z79899 Other long term (current) drug therapy: Secondary | ICD-10-CM | POA: Diagnosis not present

## 2017-09-02 DIAGNOSIS — G4733 Obstructive sleep apnea (adult) (pediatric): Secondary | ICD-10-CM | POA: Diagnosis not present

## 2017-09-02 DIAGNOSIS — E119 Type 2 diabetes mellitus without complications: Secondary | ICD-10-CM | POA: Insufficient documentation

## 2017-09-02 DIAGNOSIS — I5022 Chronic systolic (congestive) heart failure: Secondary | ICD-10-CM | POA: Insufficient documentation

## 2017-09-02 DIAGNOSIS — Z87891 Personal history of nicotine dependence: Secondary | ICD-10-CM | POA: Diagnosis not present

## 2017-09-02 DIAGNOSIS — Z794 Long term (current) use of insulin: Secondary | ICD-10-CM | POA: Insufficient documentation

## 2017-09-02 MED ORDER — LOSARTAN POTASSIUM 100 MG PO TABS: 100.0000 mg | ORAL_TABLET | Freq: Every day | ORAL | 3 refills | Status: DC

## 2017-09-02 MED ORDER — FUROSEMIDE 20 MG PO TABS: 20.0000 mg | ORAL_TABLET | Freq: Every day | ORAL | 3 refills | Status: DC

## 2017-09-02 MED ORDER — ATORVASTATIN CALCIUM 40 MG PO TABS: 40.0000 mg | ORAL_TABLET | Freq: Every day | ORAL | 3 refills | Status: DC

## 2017-09-02 MED ORDER — CARVEDILOL 12.5 MG PO TABS: 12.5000 mg | ORAL_TABLET | Freq: Two times a day (BID) | ORAL | 3 refills | Status: DC

## 2017-09-02 NOTE — Progress Notes (Signed)
Patient ID: Taylor [name], female    DOB: April 03, 1965, 52 y.o.   MRN: [id number]  HPI Ms Ramnath is a 52 y/o female with a history of diabetes, obstructive sleep apnea, remote tobacco use and chronic heart failure.  Echo report from 07/08/17 reviewed and showed and EF of 45-50% along with mild MR. Last echo was done 08/31/15 and showed an EF of 20% without valvular regurgitation.   Has not been admitted or been in the ED in the last 6 months.   She presents today for a follow-up visit with a chief complaint of minimal fatigue upon moderate exertion. She describes this as chronic in nature having been present for several years. She has no associated symptoms. She denies any difficulty sleeping, abdominal distention, palpitations, pedal edema, chest pain, shortness of breath, dizziness or weight gain. Has not taken her medications yet today. 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   Past Medical History:  Diagnosis Date  . CHF (congestive heart failure) (HCC)   . Diabetes mellitus without complication (HCC)   . Obstructive sleep apnea    Past Surgical History:  Procedure Laterality Date  . ABDOMINAL HYSTERECTOMY     Family History  Problem Relation Age of Onset  . Anemia Neg Hx   . Arrhythmia Neg Hx   . Asthma Neg Hx   . Clotting disorder Neg Hx   . Fainting Neg Hx   . Heart attack Neg Hx   . Heart disease Neg Hx   . Heart failure Neg Hx   . Hyperlipidemia Neg Hx   . Hypertension Neg Hx    Social History   Tobacco Use  . Smoking status: Former Games developer  . Smokeless tobacco: Never Used  Substance Use Topics  . Alcohol use: No    Comment: occ   No Known Allergies  Prior to Admission medications   Medication Sig Start Date End Date Taking? Authorizing Provider  atorvastatin (LIPITOR) 40 MG tablet Take 1 tablet (40 mg total) by mouth daily. 09/02/17  Yes Clarisa Kindred A, FNP  carvedilol (COREG) 12.5 MG tablet Take 1 tablet (12.5 mg total)  by mouth 2 (two) times daily with a meal. 09/02/17  Yes Myles Mallicoat A, FNP  empagliflozin (JARDIANCE) 10 MG TABS tablet Take 10 mg by mouth daily.   Yes [provider]  furosemide (LASIX) 20 MG tablet Take 1 tablet (20 mg total) by mouth daily. 09/02/17  Yes Travanti Mcmanus A, FNP  glimepiride (AMARYL) 2 MG tablet Take 2 tablets by mouth 2 (two) times daily. 02/15/15  Yes [provider]  Insulin Detemir (LEVEMIR FLEXTOUCH) 100 UNIT/ML Pen Inject 25 Units into the skin at bedtime. 04/23/17 04/23/18 Yes [provider]  losartan (COZAAR) 100 MG tablet Take 1 tablet (100 mg total) by mouth daily. 09/02/17  Yes Delma Freeze, FNP  metFORMIN (GLUCOPHAGE) 1000 MG tablet Take 1 tablet (1,000 mg total) 2 (two) times daily with a meal by mouth. 12/31/16  Yes Delma Freeze, FNP   Review of Systems  Constitutional: Positive for fatigue. Negative for appetite change.  HENT: Negative for congestion, postnasal drip and sore throat.   Eyes: Negative.   Respiratory: Negative for chest tightness, shortness of breath and wheezing.   Cardiovascular: Negative for chest pain, palpitations and leg swelling.  Gastrointestinal: Negative for abdominal distention and abdominal pain.  Endocrine: Negative.   Genitourinary: Negative.   Musculoskeletal: Negative for back pain and  neck pain.  Skin: Negative.   Allergic/Immunologic: Negative.   Neurological: Negative for dizziness and light-headedness.  Hematological: Negative for adenopathy. Does not bruise/bleed easily.  Psychiatric/Behavioral: Negative for dysphoric mood, sleep disturbance (sleeping on 2 pillows; wearing CPAP at night) and suicidal ideas. The patient is not nervous/anxious.    Vitals:   09/02/17 0857  BP: (!) 137/93  Pulse: (!) 110  Resp: 18  SpO2: 97%  Weight: 149 lb (67.6 kg)  Height: 5\' 1"  (1.549 m)   Wt Readings from Last 3 Encounters:  09/02/17 149 lb (67.6 kg)  07/01/17 158 lb 8 oz (71.9 kg)  12/31/16 154 lb  (69.9 kg)   Lab Results  Component Value Date   CREATININE 0.53 08/31/2015   CREATININE 0.67 08/30/2015   CREATININE 0.58 (L) 08/10/2012    Physical Exam  Constitutional: She is oriented to person, place, and time. She appears well-developed and well-nourished.  HENT:  Head: Normocephalic and atraumatic.  Neck: Normal range of motion. Neck supple. No JVD present.  Cardiovascular: Regular rhythm. Tachycardia present.  Pulmonary/Chest: Effort normal. She has no wheezes. She has no rales.  Abdominal: Soft. She exhibits no distension. There is no tenderness.  Musculoskeletal: She exhibits no edema or tenderness.  Neurological: She is alert and oriented to person, place, and time.  Skin: Skin is warm and dry.  Psychiatric: She has a normal mood and affect. Her behavior is normal. Thought content normal.  Nursing note and vitals reviewed.  Assessment & Plan:  1: Chronic heart failure with reduced ejection fraction- - NYHA class II - euvolemic today - Reminded to call for an overnight weight gain of >2 pounds or a weekly weight gain of >5 pounds. - weight down 9 pounds since she was last here 2 months ago - exercising by walking 3 days/week for 30 minutes at a time.  - EF has improved so no indication for entresto - last saw cardiologist Gwen Pounds([place]) 10/03/15; says that she hasn't returned to him because she can't afford the $100 co-pay as she's trying to pay off her hospital bills - BMP on 07/30/17 reviewed and showed sodium 138, potassium 4.4 and GFR 91  2: Diabetes- - last A1c was 11.6% on 07/30/17 - glucose at home yesterday was 180 - saw PCP Dareen Piano([place]) 08/06/17  3: Tachycardia- - HR continues in the 100's although she hasn't taken any of her medications yet today - did increase her carvedilol last time to 12.5mg  BID  4: Obstructive sleep apnea- - wearing CPAP on a nightly basis - reports sleeping well and waking up feeling rested  Patient did not bring her medications nor a  list. Each medication was verbally reviewed with the patient and she was encouraged to bring the bottles to every visit to confirm accuracy of list.  Return in 6 months or sooner for any questions/problems before then.

## 2017-09-02 NOTE — Patient Instructions (Signed)
Continue weighing daily and call for an overnight weight gain of > 2 pounds or a weekly weight gain of >5 pounds. 

## 2018-03-09 ENCOUNTER — Encounter: Payer: Self-pay | Admitting: Pharmacist

## 2018-03-09 NOTE — Progress Notes (Signed)
Note entered in error

## 2018-03-10 ENCOUNTER — Encounter: Payer: Self-pay | Admitting: Family

## 2018-03-10 ENCOUNTER — Ambulatory Visit: Payer: BLUE CROSS/BLUE SHIELD | Attending: Family | Admitting: Family

## 2018-03-10 VITALS — BP 159/99 | HR 106 | Resp 18 | Ht 61.0 in | Wt 149.0 lb

## 2018-03-10 DIAGNOSIS — Z87891 Personal history of nicotine dependence: Secondary | ICD-10-CM | POA: Diagnosis not present

## 2018-03-10 DIAGNOSIS — Z79899 Other long term (current) drug therapy: Secondary | ICD-10-CM | POA: Diagnosis not present

## 2018-03-10 DIAGNOSIS — Z794 Long term (current) use of insulin: Secondary | ICD-10-CM | POA: Diagnosis not present

## 2018-03-10 DIAGNOSIS — I5022 Chronic systolic (congestive) heart failure: Secondary | ICD-10-CM

## 2018-03-10 DIAGNOSIS — I509 Heart failure, unspecified: Secondary | ICD-10-CM | POA: Insufficient documentation

## 2018-03-10 DIAGNOSIS — R000 Tachycardia, unspecified: Secondary | ICD-10-CM | POA: Insufficient documentation

## 2018-03-10 DIAGNOSIS — E119 Type 2 diabetes mellitus without complications: Secondary | ICD-10-CM | POA: Diagnosis not present

## 2018-03-10 DIAGNOSIS — G4733 Obstructive sleep apnea (adult) (pediatric): Secondary | ICD-10-CM | POA: Insufficient documentation

## 2018-03-10 MED ORDER — FUROSEMIDE 20 MG PO TABS: 20.0000 mg | ORAL_TABLET | Freq: Every day | ORAL | 3 refills | Status: DC

## 2018-03-10 MED ORDER — ATORVASTATIN CALCIUM 40 MG PO TABS: 40.0000 mg | ORAL_TABLET | Freq: Every day | ORAL | 3 refills | Status: AC

## 2018-03-10 MED ORDER — LOSARTAN POTASSIUM 100 MG PO TABS: 100.0000 mg | ORAL_TABLET | Freq: Every day | ORAL | 3 refills | Status: DC

## 2018-03-10 MED ORDER — CARVEDILOL 12.5 MG PO TABS: 12.5000 mg | ORAL_TABLET | Freq: Two times a day (BID) | ORAL | 3 refills | Status: DC

## 2018-03-10 NOTE — Patient Instructions (Signed)
Continue weighing daily and call for an overnight weight gain of > 2 pounds or a weekly weight gain of >5 pounds. 

## 2018-03-10 NOTE — Progress Notes (Signed)
Patient ID: Taylor [name], female    DOB: 12/09/1965, 53 y.o.   MRN: [id number]  HPI Taylor [name] is a 53 y/o female with a history of diabetes, obstructive sleep apnea, remote tobacco use and chronic heart failure.  Echo report from 07/08/17 reviewed and showed and EF of 45-50% along with mild MR. Last echo was done 08/31/15 and showed an EF of 20% without valvular regurgitation.   Has not been admitted or been in the ED in the last 6 months.   She presents today for a follow-up visit with a chief complaint of minimal fatigue upon moderate exertion. She describes this as chronic in nature having been present for several years. She has associated cough and shortness of breath when she has to use the lysol at work. 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 When she's not working, she says that she doesn't have any shortness of breath or coughing. She denies any difficulty sleeping, abdominal distention, palpitations, pedal edema, chest pain, dizziness or weight gain.   Past Medical History:  Diagnosis Date  . CHF (congestive heart failure) (HCC)   . Diabetes mellitus without complication (HCC)   . Obstructive sleep apnea    Past Surgical History:  Procedure Laterality Date  . ABDOMINAL HYSTERECTOMY     Family History  Problem Relation Age of Onset  . Anemia Neg Hx   . Arrhythmia Neg Hx   . Asthma Neg Hx   . Clotting disorder Neg Hx   . Fainting Neg Hx   . Heart attack Neg Hx   . Heart disease Neg Hx   . Heart failure Neg Hx   . Hyperlipidemia Neg Hx   . Hypertension Neg Hx    Social History   Tobacco Use  . Smoking status: Former Games developer  . Smokeless tobacco: Never Used  Substance Use Topics  . Alcohol use: No    Comment: occ   No Known Allergies  Prior to Admission medications   Medication Sig Start Date End Date Taking? Authorizing Provider  atorvastatin (LIPITOR) 40 MG tablet Take 1 tablet (40 mg total) by mouth daily. 03/10/18   Yes Clarisa Kindred A, FNP  carvedilol (COREG) 12.5 MG tablet Take 1 tablet (12.5 mg total) by mouth 2 (two) times daily with a meal. 03/10/18  Yes Kedra Mcglade A, FNP  empagliflozin (JARDIANCE) 10 MG TABS tablet Take 10 mg by mouth daily.   Yes [provider]  furosemide (LASIX) 20 MG tablet Take 1 tablet (20 mg total) by mouth daily. 03/10/18  Yes Joliene Salvador A, FNP  glimepiride (AMARYL) 2 MG tablet Take 2 tablets by mouth 2 (two) times daily. 02/15/15  Yes [provider]  Insulin Detemir (LEVEMIR FLEXTOUCH) 100 UNIT/ML Pen Inject 25 Units into the skin at bedtime. 04/23/17 04/23/18 Yes [provider]  losartan (COZAAR) 100 MG tablet Take 1 tablet (100 mg total) by mouth daily. 03/10/18  Yes Delma Freeze, FNP  metFORMIN (GLUCOPHAGE) 1000 MG tablet Take 1 tablet (1,000 mg total) 2 (two) times daily with a meal by mouth. 12/31/16  Yes Delma Freeze, FNP    Review of Systems  Constitutional: Positive for fatigue. Negative for appetite change.  HENT: Negative for congestion, postnasal drip and sore throat.   Eyes: Negative.   Respiratory: Positive for cough and shortness of breath (after using lysol at work; none on her days off). Negative for chest tightness and wheezing.  Cardiovascular: Negative for chest pain, palpitations and leg swelling.  Gastrointestinal: Negative for abdominal distention and abdominal pain.  Endocrine: Negative.   Genitourinary: Negative.   Musculoskeletal: Negative for back pain and neck pain.  Skin: Negative.   Allergic/Immunologic: Negative.   Neurological: Negative for dizziness and light-headedness.  Hematological: Negative for adenopathy. Does not bruise/bleed easily.  Psychiatric/Behavioral: Negative for dysphoric mood, sleep disturbance (sleeping on 2 pillows; wearing CPAP at night) and suicidal ideas. The patient is not nervous/anxious.    Vitals:   03/10/18 0854  BP: (!) 159/99  Pulse: (!) 106  Resp: 18  SpO2: 97%   Weight: 149 lb (67.6 kg)  Height: 5\' 1"  (1.549 m)   Wt Readings from Last 3 Encounters:  03/10/18 149 lb (67.6 kg)  09/02/17 149 lb (67.6 kg)  07/01/17 158 lb 8 oz (71.9 kg)   Lab Results  Component Value Date   CREATININE 0.53 08/31/2015   CREATININE 0.67 08/30/2015   CREATININE 0.58 (L) 08/10/2012    Physical Exam  Constitutional: She is oriented to person, place, and time. She appears well-developed and well-nourished.  HENT:  Head: Normocephalic and atraumatic.  Neck: Normal range of motion. Neck supple. No JVD present.  Cardiovascular: Regular rhythm. Tachycardia present.  Pulmonary/Chest: Effort normal. She has no wheezes. She has no rales.  Abdominal: Soft. She exhibits no distension. There is no abdominal tenderness.  Musculoskeletal:        General: No tenderness or edema.  Neurological: She is alert and oriented to person, place, and time.  Skin: Skin is warm and dry.  Psychiatric: She has a normal mood and affect. Her behavior is normal. Thought content normal.  Nursing note and vitals reviewed.  Assessment & Plan:  1: Chronic heart failure with mildly reduced ejection fraction- - NYHA class II - euvolemic today - Reminded to call for an overnight weight gain of >2 pounds or a weekly weight gain of >5 pounds. - weight unchanged from her last visit here 6 months ago - exercising by walking 3 days/week for 30 minutes at a time.  - EF has improved so no indication for entresto - last saw cardiologist Gwen Pounds([place]) 10/03/15; says that she hasn't returned to him because she can't afford the $100 co-pay as she's trying to pay off her hospital bills - BMP on 12/10/17 reviewed and showed sodium 138, potassium 4.5, creatinine 0.6 and GFR 127 - has received her flu vaccine for this season  2: Diabetes- - last A1c was 12.3% on 12/10/17 - glucose at home yesterday was 180 - saw PCP Dareen Piano([place]) 12/17/17  3: Tachycardia- - HR continues in the 100's although she hasn't taken  any of her medications yet today  4: Obstructive sleep apnea- - wearing CPAP on a nightly basis - reports sleeping well and waking up feeling rested  Patient did not bring her medications nor a list. Each medication was verbally reviewed with the patient and she was encouraged to bring the bottles to every visit to confirm accuracy of list.  Return in 6 months or sooner for any questions/problems before then.

## 2018-06-18 IMAGING — CR DG CHEST 2V
1 series · 2 of 2 positions shown · non-contrast
Comparison: None.

CLINICAL DATA: Shortness of breath for a couple of days.

EXAM:
CHEST  2 VIEW

[Series 1: dg chest 2 view · 0.14mm/px · 2 of 2 slices shown]
[im 1/2]
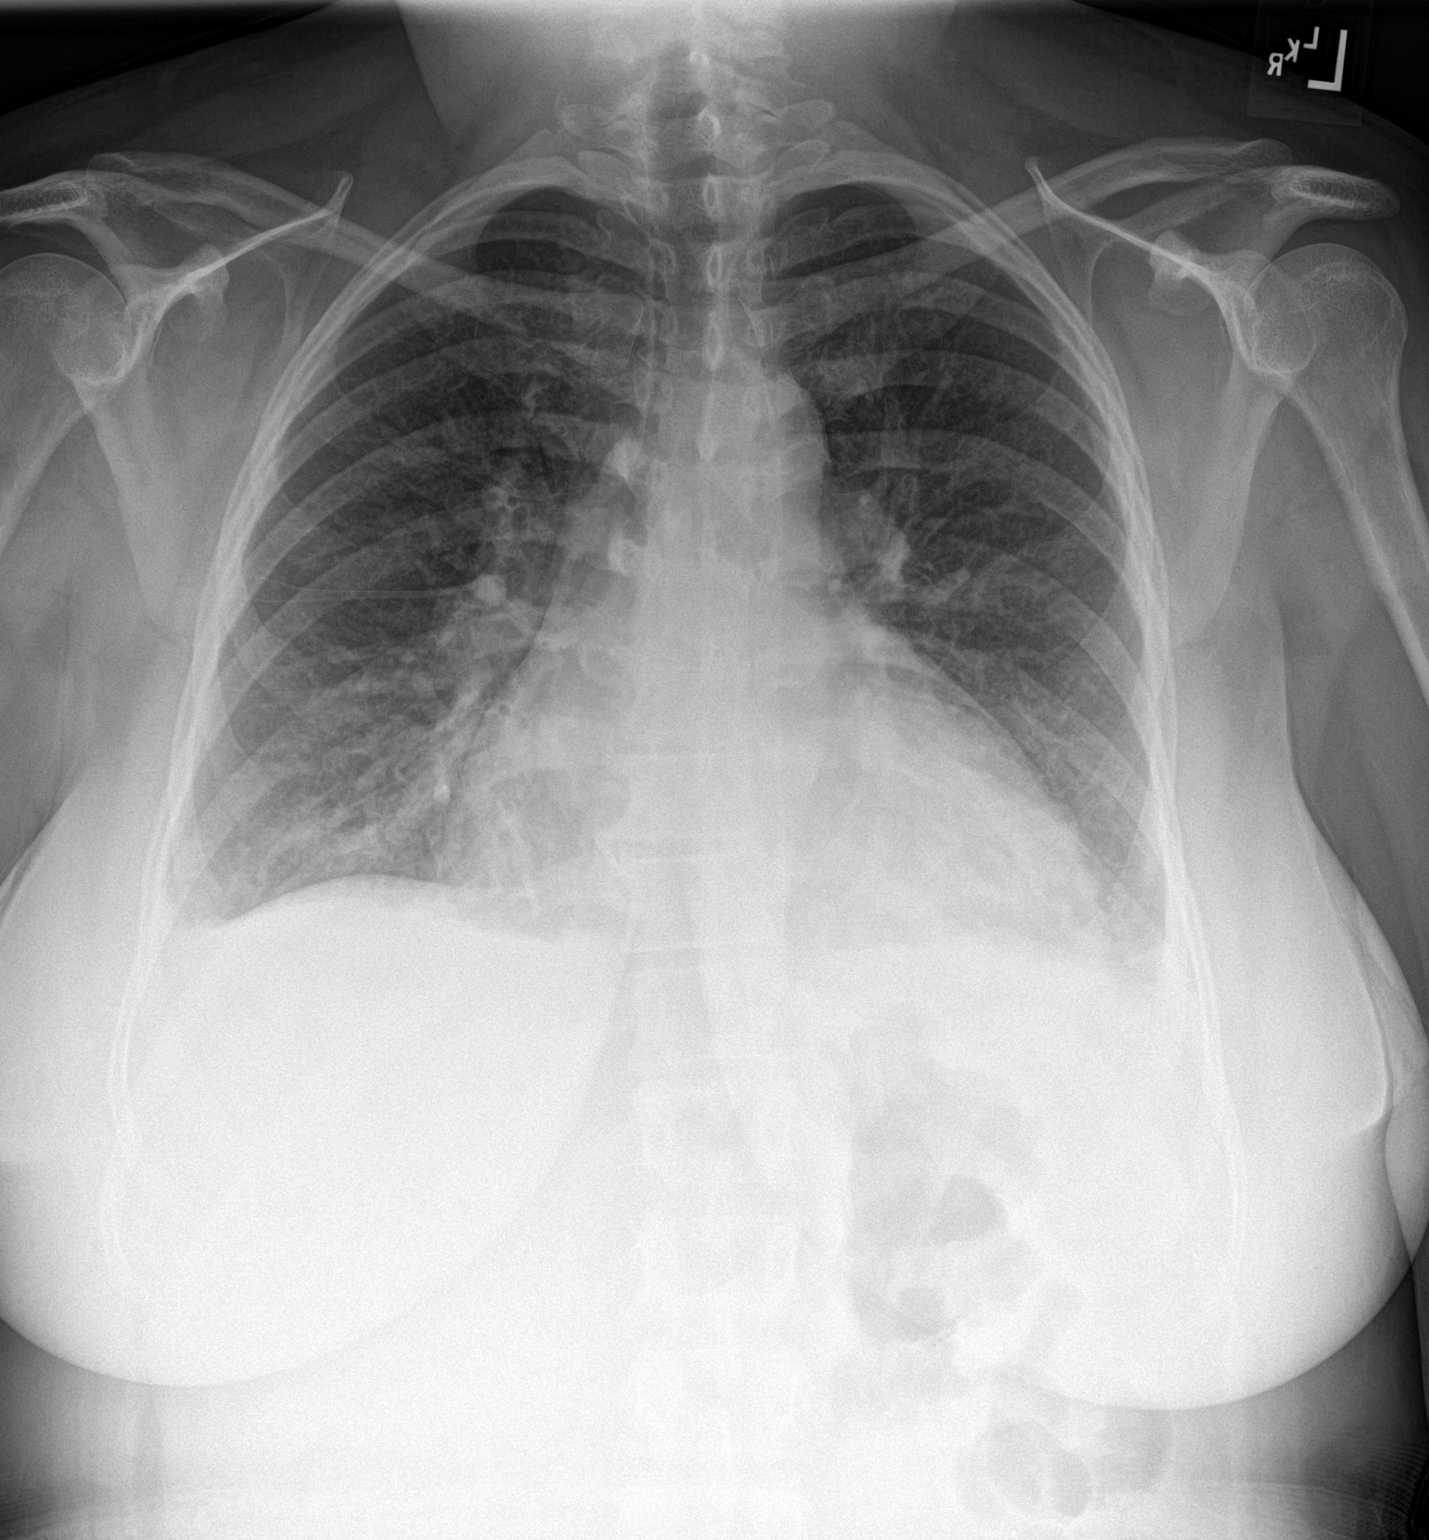
[im 2/2]
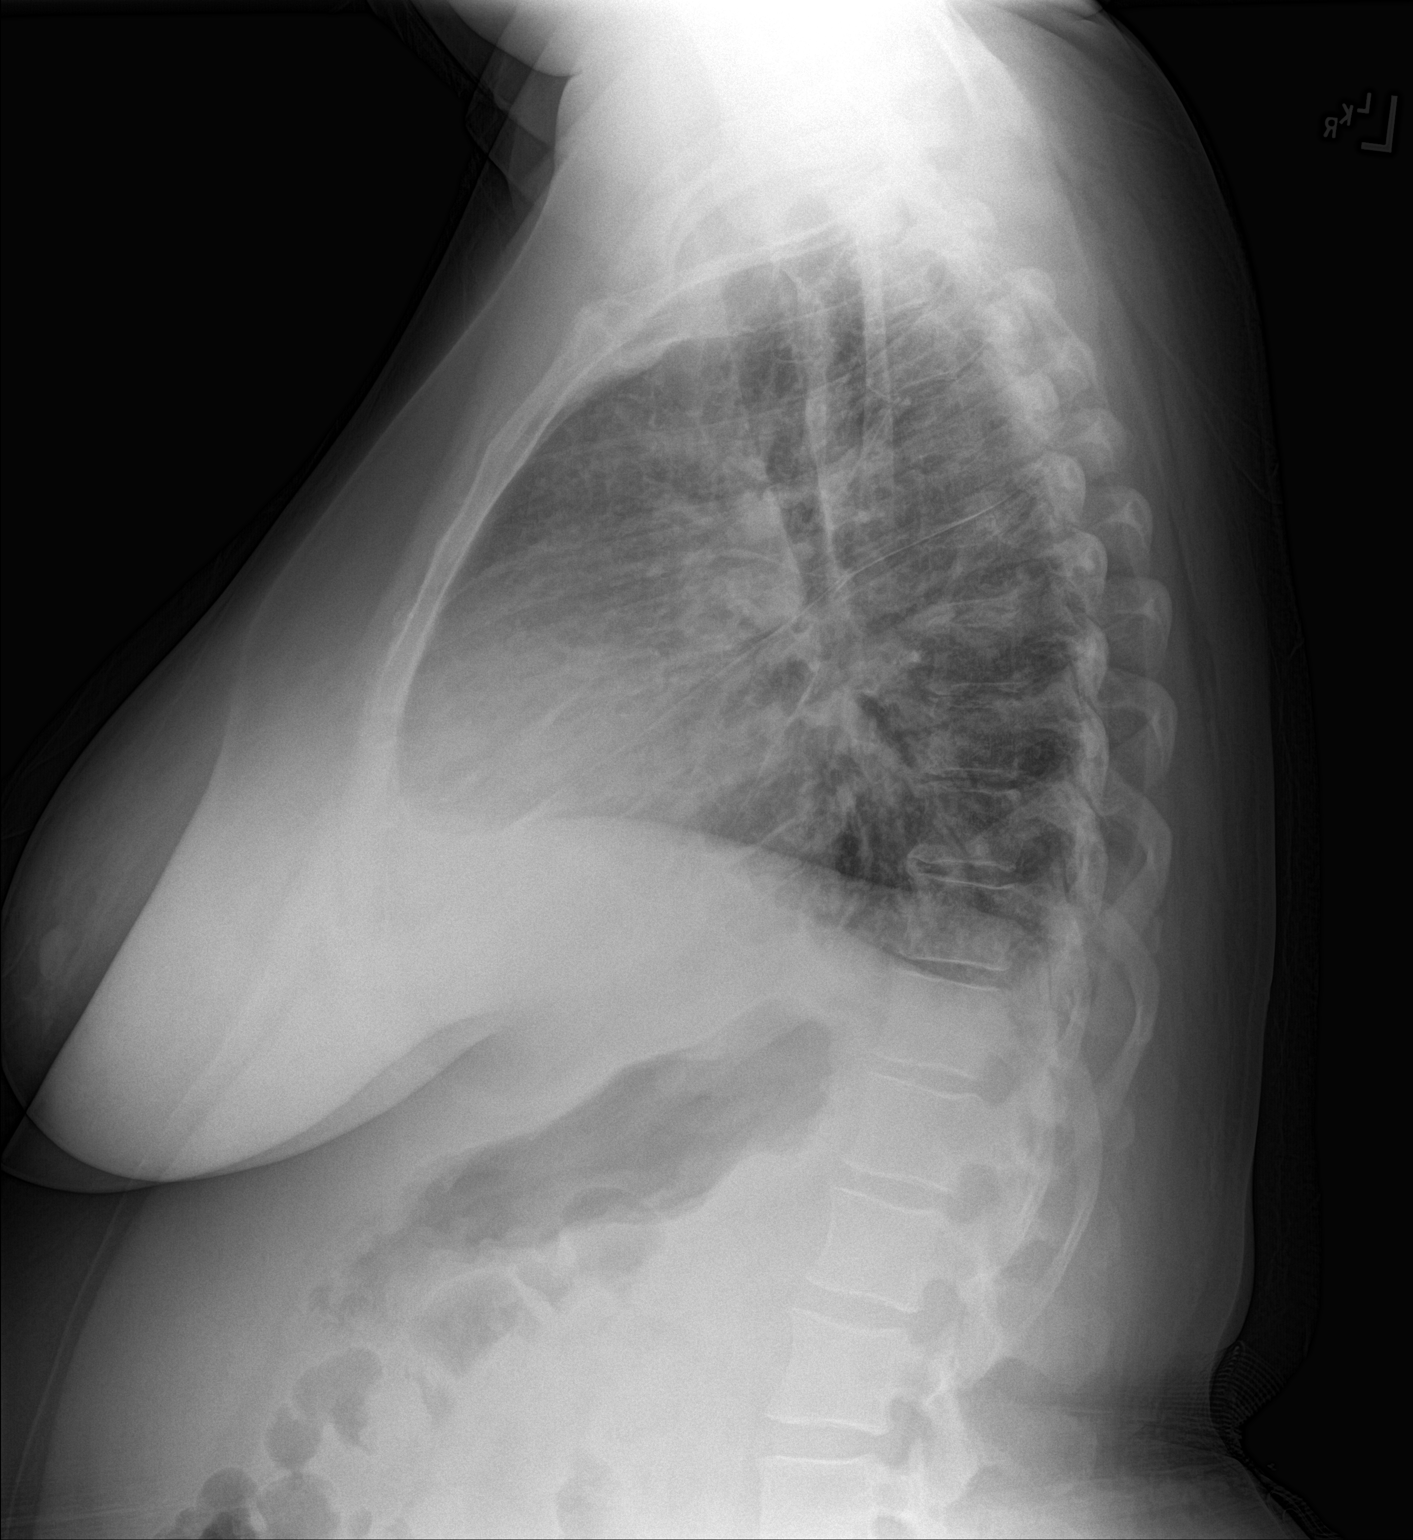

[2 of 2 positions shown; findings below may reference images not displayed]

FINDINGS: Two views study shows bibasilar atelectasis or infiltrate with tiny
bilateral pleural effusions. Cardiopericardial silhouette is
enlarged. Vascular congestion noted without overt airspace pulmonary
edema. The visualized bony structures of the thorax are intact.
IMPRESSION: Cardiomegaly with vascular congestion and tiny bilateral pleural
effusions. Underlying component of interstitial pulmonary edema not
excluded.

Probable basilar atelectasis.

## 2018-08-31 ENCOUNTER — Other Ambulatory Visit: Payer: Self-pay

## 2018-08-31 ENCOUNTER — Emergency Department: Payer: BLUE CROSS/BLUE SHIELD

## 2018-08-31 ENCOUNTER — Inpatient Hospital Stay
Admission: EM | Admit: 2018-08-31 | Discharge: 2018-09-03 | DRG: 291 | Disposition: A | Discharge status: Home / Self Care | Payer: BLUE CROSS/BLUE SHIELD | Attending: Internal Medicine | Admitting: Internal Medicine

## 2018-08-31 DIAGNOSIS — I361 Nonrheumatic tricuspid (valve) insufficiency: Secondary | ICD-10-CM | POA: Diagnosis not present

## 2018-08-31 DIAGNOSIS — E119 Type 2 diabetes mellitus without complications: Secondary | ICD-10-CM | POA: Diagnosis present

## 2018-08-31 DIAGNOSIS — J9601 Acute respiratory failure with hypoxia: Secondary | ICD-10-CM | POA: Diagnosis present

## 2018-08-31 DIAGNOSIS — Z20828 Contact with and (suspected) exposure to other viral communicable diseases: Secondary | ICD-10-CM | POA: Diagnosis present

## 2018-08-31 DIAGNOSIS — I5023 Acute on chronic systolic (congestive) heart failure: Principal | ICD-10-CM | POA: Diagnosis present

## 2018-08-31 DIAGNOSIS — A419 Sepsis, unspecified organism: Secondary | ICD-10-CM | POA: Insufficient documentation

## 2018-08-31 DIAGNOSIS — Z90710 Acquired absence of both cervix and uterus: Secondary | ICD-10-CM

## 2018-08-31 DIAGNOSIS — E872 Acidosis: Secondary | ICD-10-CM | POA: Diagnosis present

## 2018-08-31 DIAGNOSIS — J189 Pneumonia, unspecified organism: Secondary | ICD-10-CM | POA: Diagnosis present

## 2018-08-31 DIAGNOSIS — Z794 Long term (current) use of insulin: Secondary | ICD-10-CM | POA: Diagnosis not present

## 2018-08-31 DIAGNOSIS — Z79899 Other long term (current) drug therapy: Secondary | ICD-10-CM

## 2018-08-31 DIAGNOSIS — F17200 Nicotine dependence, unspecified, uncomplicated: Secondary | ICD-10-CM | POA: Diagnosis present

## 2018-08-31 DIAGNOSIS — G4733 Obstructive sleep apnea (adult) (pediatric): Secondary | ICD-10-CM | POA: Diagnosis present

## 2018-08-31 DIAGNOSIS — I509 Heart failure, unspecified: Secondary | ICD-10-CM

## 2018-08-31 LAB — CBC WITH DIFFERENTIAL/PLATELET
Abs Immature Granulocytes: 0.08 10*3/uL — ABNORMAL HIGH (ref 0.00–0.07)
Basophils Absolute: 0.1 10*3/uL (ref 0.0–0.1)
Basophils Relative: 1 %
Eosinophils Absolute: 0.3 10*3/uL (ref 0.0–0.5)
Eosinophils Relative: 2 %
HCT: 41.1 % (ref 36.0–46.0)
Hemoglobin: 12.9 g/dL (ref 12.0–15.0)
Immature Granulocytes: 1 %
Lymphocytes Relative: 16 %
Lymphs Abs: 2.4 10*3/uL (ref 0.7–4.0)
MCH: 31.3 pg (ref 26.0–34.0)
MCHC: 31.4 g/dL (ref 30.0–36.0)
MCV: 99.8 fL (ref 80.0–100.0)
Monocytes Absolute: 0.7 10*3/uL (ref 0.1–1.0)
Monocytes Relative: 5 %
Neutro Abs: 11.2 10*3/uL — ABNORMAL HIGH (ref 1.7–7.7)
Neutrophils Relative %: 75 %
Platelets: 265 10*3/uL (ref 150–400)
RBC: 4.12 MIL/uL (ref 3.87–5.11)
RDW: 12.7 % (ref 11.5–15.5)
WBC: 14.8 10*3/uL — ABNORMAL HIGH (ref 4.0–10.5)
nRBC: 0 % (ref 0.0–0.2)

## 2018-08-31 LAB — COMPREHENSIVE METABOLIC PANEL
ALT: 131 U/L — ABNORMAL HIGH (ref 0–44)
AST: 252 U/L — ABNORMAL HIGH (ref 15–41)
Albumin: 4 g/dL (ref 3.5–5.0)
Alkaline Phosphatase: 160 U/L — ABNORMAL HIGH (ref 38–126)
Anion gap: 13 (ref 5–15)
BUN: 12 mg/dL (ref 6–20)
CO2: 24 mmol/L (ref 22–32)
Calcium: 8.8 mg/dL — ABNORMAL LOW (ref 8.9–10.3)
Chloride: 103 mmol/L (ref 98–111)
Creatinine, Ser: 0.67 mg/dL (ref 0.44–1.00)
GFR calc Af Amer: >60 mL/min (ref >60)
GFR calc non Af Amer: >60 mL/min (ref >60)
Glucose, Bld: 469 mg/dL — ABNORMAL HIGH (ref 70–99)
Potassium: 3.3 mmol/L — ABNORMAL LOW (ref 3.5–5.1)
Sodium: 140 mmol/L (ref 135–145)
Total Bilirubin: 1 mg/dL (ref 0.3–1.2)
Total Protein: 7 g/dL (ref 6.5–8.1)

## 2018-08-31 LAB — APTT: aPTT: 27 seconds (ref 24–36)

## 2018-08-31 LAB — PROTIME-INR
INR: 1 (ref 0.8–1.2)
Prothrombin Time: 13.3 seconds (ref 11.4–15.2)

## 2018-08-31 LAB — LIPASE, BLOOD: Lipase: 25 U/L (ref 11–51)

## 2018-08-31 LAB — LACTIC ACID, PLASMA: Lactic Acid, Venous: 4.1 mmol/L (ref 0.5–1.9)

## 2018-08-31 MED ORDER — FUROSEMIDE 10 MG/ML IJ SOLN
40.0000 mg | Freq: Once | INTRAMUSCULAR | Status: AC
  Administered 2018-08-31: 40 mg via INTRAVENOUS
  Filled 2018-08-31: qty 4

## 2018-08-31 MED ORDER — SODIUM CHLORIDE 0.9 % IV SOLN
2.0000 g | INTRAVENOUS | Status: DC
  Administered 2018-08-31: 2 g via INTRAVENOUS
  Filled 2018-08-31: qty 20

## 2018-08-31 MED ORDER — SODIUM CHLORIDE 0.9 % IV SOLN
500.0000 mg | INTRAVENOUS | Status: DC
  Administered 2018-08-31: 500 mg via INTRAVENOUS
  Filled 2018-08-31: qty 500

## 2018-08-31 NOTE — ED Provider Notes (Addendum)
[facility] Emergency Department Provider Note  ____________________________________________  Time seen: Approximately 11:12 PM  I have reviewed the triage vital signs and the nursing notes.   HISTORY  Chief Complaint Shortness of Breath    HPI Taylor [name] is a 53 y.o. female with a history of CHF diabetes who was brought to the ED due to worsening shortness of breath, gradual onset, constant, ongoing for the past 2 days.  Worse with walking, worse lying down supine, better sitting upright.  Had respiratory distress today so EMS were called and found the patient to have a room air oxygen saturation of 78%, improved on CPAP.  Patient denies chest pain.  Reports compliance with medications.      Past Medical History:  Diagnosis Date  . CHF (congestive heart failure) ([place])   . Diabetes mellitus without complication ([place])   . Obstructive sleep apnea      Patient Active Problem List   Diagnosis Date Noted  . Obstructive sleep apnea 09/03/2016  . Diabetes ([place]) 09/14/2015  . Tachycardia 09/14/2015  . CHF (congestive heart failure) ([place]) 08/30/2015     Past Surgical History:  Procedure Laterality Date  . ABDOMINAL HYSTERECTOMY       Prior to Admission medications   Medication Sig Start Date End Date Taking? Authorizing Provider  atorvastatin (LIPITOR) 40 MG tablet Take 1 tablet (40 mg total) by mouth daily. 03/10/18   Alisa Graff, FNP  carvedilol (COREG) 12.5 MG tablet Take 1 tablet (12.5 mg total) by mouth 2 (two) times daily with a meal. 03/10/18   Alisa Graff, FNP  empagliflozin (JARDIANCE) 10 MG TABS tablet Take 10 mg by mouth daily.    [provider]  furosemide (LASIX) 20 MG tablet Take 1 tablet (20 mg total) by mouth daily. 03/10/18   Alisa Graff, FNP  glimepiride (AMARYL) 2 MG tablet Take 2 tablets by mouth 2 (two) times daily. 02/15/15   [provider]  Insulin Detemir (LEVEMIR FLEXTOUCH) 100 UNIT/ML Pen Inject  25 Units into the skin at bedtime. 04/23/17 04/23/18  [provider]  losartan (COZAAR) 100 MG tablet Take 1 tablet (100 mg total) by mouth daily. 03/10/18   Alisa Graff, FNP  metFORMIN (GLUCOPHAGE) 1000 MG tablet Take 1 tablet (1,000 mg total) 2 (two) times daily with a meal by mouth. 12/31/16   Alisa Graff, FNP     Allergies Patient has no known allergies.   Family History  Problem Relation Age of Onset  . Anemia Neg Hx   . Arrhythmia Neg Hx   . Asthma Neg Hx   . Clotting disorder Neg Hx   . Fainting Neg Hx   . Heart attack Neg Hx   . Heart disease Neg Hx   . Heart failure Neg Hx   . Hyperlipidemia Neg Hx   . Hypertension Neg Hx     Social History Social History   Tobacco Use  . Smoking status: Former Research scientist (life sciences)  . Smokeless tobacco: Never Used  Substance Use Topics  . Alcohol use: No    Comment: occ  . Drug use: No    Review of Systems  Constitutional:   No fever or chills.  ENT:   No sore throat. No rhinorrhea. Cardiovascular:   No chest pain or syncope. Respiratory:   Positive shortness of breath and nonproductive cough. Gastrointestinal:   Negative for abdominal pain, vomiting and diarrhea.  Musculoskeletal:   Negative for focal pain or swelling All  other systems reviewed and are negative except as documented above in ROS and HPI.  ____________________________________________   PHYSICAL EXAM:  VITAL SIGNS: ED Triage Vitals  Enc Vitals Group     BP 08/31/18 2244 136/66     Pulse Rate 08/31/18 2240 (!) 117     Resp 08/31/18 2240 (!) 36     Temp --      Temp src --      SpO2 08/31/18 2240 97 %     Weight 08/31/18 2241 196 lb (88.9 kg)     Height 08/31/18 2241 5\' 3"  (1.6 m)     Head Circumference --      Peak Flow --      Pain Score 08/31/18 2241 0     Pain Loc --      Pain Edu? --      Excl. in GC? --     Vital signs reviewed, nursing assessments reviewed.   Constitutional:   Alert and oriented.  Ill-appearing. Eyes:   Conjunctivae  are normal. EOMI. PERRL. ENT      Head:   Normocephalic and atraumatic.      Nose:   No congestion/rhinnorhea.       Mouth/Throat:   MMM, no pharyngeal erythema. No peritonsillar mass.       Neck:   No meningismus. Full ROM. Hematological/Lymphatic/Immunilogical:   No cervical lymphadenopathy. Cardiovascular:   Tachycardia heart rate 115. Symmetric bilateral radial and DP pulses.  No murmurs. Cap refill less than 2 seconds. Respiratory:   Tachypnea, increased work of breathing.  Crackles at bilateral bases.  Gastrointestinal: Soft and nontender.  Not distended.  No CVA tenderness.  No rebound, rigidity, or guarding.  Musculoskeletal:   Normal range of motion in all extremities. No joint effusions.  No lower extremity tenderness.  Trace bilateral pedal edema. Neurologic:   Normal speech and language.  Motor grossly intact. No acute focal neurologic deficits are appreciated.  Skin:    Skin is warm and diaphoretic. No rash noted.  No petechiae, purpura, or bullae.  ____________________________________________    LABS (pertinent positives/negatives) (all labs ordered are listed, but only abnormal results are displayed) Labs Reviewed  CBC WITH DIFFERENTIAL/PLATELET - Abnormal; Notable for the following components:      Result Value   WBC 14.8 (*)    Neutro Abs 11.2 (*)    Abs Immature Granulocytes 0.08 (*)    All other components within normal limits  CULTURE, BLOOD (ROUTINE X 2)  CULTURE, BLOOD (ROUTINE X 2)  URINE CULTURE  SARS CORONAVIRUS 2 (HOSPITAL ORDER, PERFORMED IN [facility])  LACTIC ACID, PLASMA  LACTIC ACID, PLASMA  COMPREHENSIVE METABOLIC PANEL  LIPASE, BLOOD  PROCALCITONIN  APTT  PROTIME-INR  URINALYSIS, COMPLETE (UACMP) WITH MICROSCOPIC  BLOOD GAS, VENOUS  POC URINE PREG, ED   ____________________________________________   EKG  Interpreted by me Sinus tachycardia rate 116, left axis, normal intervals.  Poor R wave progression.  Normal ST  segments and T waves.  No acute ischemic changes.  ____________________________________________    RADIOLOGY  No results found.  ____________________________________________   PROCEDURES .Critical Care Performed by: Sharman CheekStafford, Zavian Slowey, MD Authorized by: Sharman CheekStafford, Genevia Bouldin, MD   Critical care provider statement:    Critical care time (minutes):  35   Critical care time was exclusive of:  Separately billable procedures and treating other patients   Critical care was necessary to treat or prevent imminent or life-threatening deterioration of the following conditions:  Respiratory failure  Critical care was time spent personally by me on the following activities:  Development of treatment plan with patient or surrogate, discussions with consultants, evaluation of patient's response to treatment, examination of patient, obtaining history from patient or surrogate, ordering and performing treatments and interventions, ordering and review of laboratory studies, ordering and review of radiographic studies, pulse oximetry, re-evaluation of patient's condition and review of old charts    ____________________________________________  DIFFERENTIAL DIAGNOSIS   Pulmonary edema, pleural effusion, pneumonia/sepsis.  Doubt pneumothorax PE ACS dissection or carditis.  CLINICAL IMPRESSION / ASSESSMENT AND PLAN / ED COURSE  Medications ordered in the ED: Medications  cefTRIAXone (ROCEPHIN) 2 g in sodium chloride 0.9 % 100 mL IVPB (has no administration in time range)  azithromycin (ZITHROMAX) 500 mg in sodium chloride 0.9 % 250 mL IVPB (has no administration in time range)  furosemide (LASIX) injection 40 mg (has no administration in time range)    Pertinent labs & imaging results that were available during my care of the patient were reviewed by me and considered in my medical decision making (see chart for details).  Taylor [name] was evaluated in Emergency Department on 08/31/2018 for the  symptoms described in the history of present illness. She was evaluated in the context of the global COVID-19 pandemic, which necessitated consideration that the patient might be at risk for infection with the SARS-CoV-2 virus that causes COVID-19. Institutional protocols and algorithms that pertain to the evaluation of patients at risk for COVID-19 are in a state of rapid change based on information released by regulatory bodies including the CDC and federal and state organizations. These policies and algorithms were followed during the patient's care in the ED.   Patient presents with respiratory distress and bibasilar crackles with peripheral edema and symptoms of orthopnea, all consistent with CHF exacerbation and pulmonary edema.  Improved on BiPAP with FiO2 28% maintaining oxygen saturation of 97%.  Patient is hypothermic, tachypneic, tachycardic necessitating a sepsis work-up.  Possible COVID.  Plan to admit for further management.  IV Lasix ordered.  Clinical Course as of Aug 31 2315  Tue Aug 31, 2018  2317 Chest x-ray image viewed by me, radiology interpretation reviewed.  Consistent with pulmonary edema/CHF.   [PS]    Clinical Course User Index [PS] Sharman CheekStafford, Mandie Crabbe, MD     ____________________________________________   FINAL CLINICAL IMPRESSION(S) / ED DIAGNOSES    Final diagnoses:  Acute respiratory failure with hypoxia (HCC)  Acute on chronic congestive heart failure, unspecified heart failure type [facility](HCC)     ED Discharge Orders    None      Portions of this note were generated with dragon dictation software. Dictation errors may occur despite best attempts at proofreading.   Sharman CheekStafford, Salman Wellen, MD 08/31/18 2316    Sharman CheekStafford, Meesha Sek, MD 08/31/18 2317

## 2018-08-31 NOTE — Progress Notes (Signed)
CODE SEPSIS - PHARMACY COMMUNICATION  **Broad Spectrum Antibiotics should be administered within 1 hour of Sepsis diagnosis**  Time Code Sepsis Called/Page Received: 2249  Antibiotics Ordered: azithromycin/ceftriaxone  Time of 1st antibiotic administration: 2325  Additional action taken by pharmacy:   If necessary, Name of Provider/Nurse Contacted:     Tobie Lords ,PharmD Clinical Pharmacist  08/31/2018  11:55 PM

## 2018-08-31 NOTE — H&P (Signed)
[facility] - [place] at [facility]   PATIENT NAME: Taylor [name]    MR#:  [id number]  DATE OF BIRTH:  [birth year]  DATE OF ADMISSION:  08/31/2018  PRIMARY CARE PHYSICIAN: Lauro RegulusAnderson, Marshall W, MD   REQUESTING/REFERRING PHYSICIAN: Scotty CourtStafford, MD  CHIEF COMPLAINT:   Chief Complaint  Patient presents with  . Shortness of Breath    HISTORY OF PRESENT ILLNESS:  Taylor [name]  is a 53 y.o. female who presents with chief complaint as above.  Patient presents to the ED with a complaint of worsening shortness of breath.  She states that this is been getting worse over the past 2 to 3 days.  Today she reached a point where she was very dyspneic doing even the simplest movement.  Here in the ED work-up shows significant amount of pulmonary edema.  She did require BiPAP for oxygenation as she was initially hypoxic.  She does have a history of CHF.  She also had an elevated lactic acid and an elevated white blood cell count, so she was initially given antibiotics for possible occult pneumonia as well.  Hospitalist were called for admission  PAST MEDICAL HISTORY:   Past Medical History:  Diagnosis Date  . CHF (congestive heart failure) (HCC)   . Diabetes mellitus without complication (HCC)   . Obstructive sleep apnea      PAST SURGICAL HISTORY:   Past Surgical History:  Procedure Laterality Date  . ABDOMINAL HYSTERECTOMY       SOCIAL HISTORY:   Social History   Tobacco Use  . Smoking status: Former Games developermoker  . Smokeless tobacco: Never Used  Substance Use Topics  . Alcohol use: No    Comment: occ     FAMILY HISTORY:   Family History  Problem Relation Age of Onset  . Anemia Neg Hx   . Arrhythmia Neg Hx   . Asthma Neg Hx   . Clotting disorder Neg Hx   . Fainting Neg Hx   . Heart attack Neg Hx   . Heart disease Neg Hx   . Heart failure Neg Hx   . Hyperlipidemia Neg Hx   . Hypertension Neg Hx      DRUG ALLERGIES:  No Known Allergies  MEDICATIONS AT  HOME:   Prior to Admission medications   Medication Sig Start Date End Date Taking? Authorizing Provider  atorvastatin (LIPITOR) 40 MG tablet Take 1 tablet (40 mg total) by mouth daily. 03/10/18   Delma FreezeHackney, Tina A, FNP  carvedilol (COREG) 12.5 MG tablet Take 1 tablet (12.5 mg total) by mouth 2 (two) times daily with a meal. 03/10/18   Delma FreezeHackney, Tina A, FNP  empagliflozin (JARDIANCE) 10 MG TABS tablet Take 10 mg by mouth daily.    [provider]  furosemide (LASIX) 20 MG tablet Take 1 tablet (20 mg total) by mouth daily. 03/10/18   Delma FreezeHackney, Tina A, FNP  glimepiride (AMARYL) 2 MG tablet Take 2 tablets by mouth 2 (two) times daily. 02/15/15   [provider]  Insulin Detemir (LEVEMIR FLEXTOUCH) 100 UNIT/ML Pen Inject 25 Units into the skin at bedtime. 04/23/17 04/23/18  [provider]  losartan (COZAAR) 100 MG tablet Take 1 tablet (100 mg total) by mouth daily. 03/10/18   Delma FreezeHackney, Tina A, FNP  metFORMIN (GLUCOPHAGE) 1000 MG tablet Take 1 tablet (1,000 mg total) 2 (two) times daily with a meal by mouth. 12/31/16   Delma FreezeHackney, Tina A, FNP    REVIEW OF SYSTEMS:  Review of Systems  Constitutional: Negative for chills, fever, malaise/fatigue and weight loss.  HENT: Negative for ear pain, hearing loss and tinnitus.   Eyes: Negative for blurred vision, double vision, pain and redness.  Respiratory: Positive for cough and shortness of breath. Negative for hemoptysis.   Cardiovascular: Negative for chest pain, palpitations, orthopnea and leg swelling.  Gastrointestinal: Negative for abdominal pain, constipation, diarrhea, nausea and vomiting.  Genitourinary: Negative for dysuria, frequency and hematuria.  Musculoskeletal: Negative for back pain, joint pain and neck pain.  Skin:       No acne, rash, or lesions  Neurological: Negative for dizziness, tremors, focal weakness and weakness.  Endo/Heme/Allergies: Negative for polydipsia. Does not bruise/bleed easily.  Psychiatric/Behavioral:  Negative for depression. The patient is not nervous/anxious and does not have insomnia.      VITAL SIGNS:   Vitals:   08/31/18 2324 08/31/18 2325 08/31/18 2326 08/31/18 2327  BP:      Pulse: (!) 109 (!) 107 (!) 107 (!) 107  Resp: (!) 28 (!) 39 (!) 27 (!) 25  SpO2: 93% 91% 92% 92%  Weight:      Height:       Wt Readings from Last 3 Encounters:  08/31/18 88.9 kg  03/10/18 67.6 kg  09/02/17 67.6 kg    PHYSICAL EXAMINATION:  Physical Exam  Vitals reviewed. Constitutional: She is oriented to person, place, and time. She appears well-developed and well-nourished. No distress.  HENT:  Head: Normocephalic and atraumatic.  Mouth/Throat: Oropharynx is clear and moist.  Eyes: Pupils are equal, round, and reactive to light. Conjunctivae and EOM are normal. No scleral icterus.  Neck: Normal range of motion. Neck supple. No JVD present. No thyromegaly present.  Cardiovascular: Regular rhythm and intact distal pulses. Exam reveals no gallop and no friction rub.  No murmur heard. Tachycardic  Respiratory: She is in respiratory distress (On BiPAP). She has no wheezes. She has rales.  GI: Soft. Bowel sounds are normal. She exhibits no distension. There is no abdominal tenderness.  Musculoskeletal: Normal range of motion.        General: No edema.     Comments: No arthritis, no gout  Lymphadenopathy:    She has no cervical adenopathy.  Neurological: She is alert and oriented to person, place, and time. No cranial nerve deficit.  No dysarthria, no aphasia  Skin: Skin is warm and dry. No rash noted. No erythema.  Psychiatric: She has a normal mood and affect. Her behavior is normal. Judgment and thought content normal.    LABORATORY PANEL:   CBC Recent Labs  Lab 08/31/18 2251  WBC 14.8*  HGB 12.9  HCT 41.1  PLT 265   ------------------------------------------------------------------------------------------------------------------  Chemistries  Recent Labs  Lab 08/31/18 2251   NA 140  K 3.3*  CL 103  CO2 24  GLUCOSE 469*  BUN 12  CREATININE 0.67  CALCIUM 8.8*  AST 252*  ALT 131*  ALKPHOS 160*  BILITOT 1.0   ------------------------------------------------------------------------------------------------------------------  Cardiac Enzymes No results for input(s): TROPONINI in the last 168 hours. ------------------------------------------------------------------------------------------------------------------  RADIOLOGY:  Dg Chest Port 1 View  Result Date: 08/31/2018 CLINICAL DATA:  Shortness of breath EXAM: PORTABLE CHEST 1 VIEW COMPARISON:  08/30/2015 FINDINGS: Cardiomegaly with vascular congestion and diffuse bilateral interstitial and hazy opacities suspicious for edema. No large effusion. No pneumothorax or focal consolidation. IMPRESSION: Cardiomegaly with vascular congestion and diffuse bilateral interstitial and hazy opacities suspect pulmonary edema. Electronically Signed   By: Jasmine PangKim  Fujinaga M.D.   On: 08/31/2018 23:12  EKG:   Orders placed or performed during the hospital encounter of 08/31/18  . EKG 12-Lead  . EKG 12-Lead  . EKG 12-Lead  . EKG 12-Lead    IMPRESSION AND PLAN:  Principal Problem:   Acute respiratory failure with hypoxia (HCC) -patient's oxygenation is stabilized on BiPAP.  It appears that her respiratory failure is likely related solely to pulmonary edema due to her exacerbation of heart failure.  Procalcitonin subsequently checked in the ED was negative.  Initial lactic acid was 4.1, however I suspect this is due to a non-sepsis related condition, specifically her hypoxia which was caused by her heart failure exacerbation.  Repeat lactic is pending.  She was given antibiotics initially.  Repeat chest x-ray might further elucidate if she has underlying pneumonia once her edema has cleared.  Admit to ICU on BiPAP. Active Problems:   Acute on chronic systolic CHF (congestive heart failure) ([place]) -currently on BiPAP.  Lasix was  not given initially due to her elevated lactic acid, with a concern for possible sepsis and pneumonia.  Repeat lactic is pending.  If this is normalized now after period of increased oxygenation, she could be given some diuresis.   Diabetes ([place]) -sliding scale insulin coverage   Obstructive sleep apnea -currently on BiPAP as above  Chart review performed and case discussed with ED provider. Labs, imaging and/or ECG reviewed by provider and discussed with patient/family. Management plans discussed with the patient and/or family.  COVID-19 status: Pending  DVT PROPHYLAXIS: SubQ lovenox   GI PROPHYLAXIS:  None  ADMISSION STATUS: Inpatient     CODE STATUS: Full Code Status History    Date Active Date Inactive Code Status Order ID Comments User Context   08/30/2015 2001 08/31/2015 1959 Full Code [id number]  Fritzi Mandes, MD ED   Advance Care Planning Activity      TOTAL CRITICAL CARE TIME TAKING CARE OF THIS PATIENT: 50 minutes.   This patient was evaluated in the context of the global COVID-19 pandemic, which necessitated consideration that the patient might be at risk for infection with the SARS-CoV-2 virus that causes COVID-19. Institutional protocols and algorithms that pertain to the evaluation of patients at risk for COVID-19 are in a state of rapid change based on information released by regulatory bodies including the CDC and federal and state organizations. These policies and algorithms were followed to the best of this provider's knowledge to date during the patient's care at this facility.  Ethlyn Daniels 08/31/2018, 11:58 PM  [facility]  Office  [phone]  CC: Primary care physician; Kirk Ruths, MD  Note:  This document was prepared using Dragon voice recognition software and may include unintentional dictation errors.

## 2018-08-31 NOTE — ED Triage Notes (Signed)
Patient from home, c/o of sob hx of chf. Patient sating 78% on room air at home placed on cpap in route to ed. Patient alert, non verbal, working to breath and diaphoretic.

## 2018-09-01 ENCOUNTER — Other Ambulatory Visit: Payer: Self-pay

## 2018-09-01 ENCOUNTER — Inpatient Hospital Stay (HOSPITAL_COMMUNITY)
Admit: 2018-09-01 | Discharge: 2018-09-01 | Disposition: A | Discharge status: Home / Self Care | Payer: BLUE CROSS/BLUE SHIELD | Attending: Pulmonary Disease | Admitting: Pulmonary Disease

## 2018-09-01 ENCOUNTER — Encounter: Payer: Self-pay | Admitting: *Deleted

## 2018-09-01 DIAGNOSIS — I361 Nonrheumatic tricuspid (valve) insufficiency: Secondary | ICD-10-CM

## 2018-09-01 DIAGNOSIS — J9601 Acute respiratory failure with hypoxia: Secondary | ICD-10-CM

## 2018-09-01 DIAGNOSIS — I5023 Acute on chronic systolic (congestive) heart failure: Principal | ICD-10-CM

## 2018-09-01 LAB — COMPREHENSIVE METABOLIC PANEL
ALT: 136 U/L — ABNORMAL HIGH (ref 0–44)
AST: 197 U/L — ABNORMAL HIGH (ref 15–41)
Albumin: 4 g/dL (ref 3.5–5.0)
Alkaline Phosphatase: 158 U/L — ABNORMAL HIGH (ref 38–126)
Anion gap: 15 (ref 5–15)
BUN: 17 mg/dL (ref 6–20)
CO2: 23 mmol/L (ref 22–32)
Calcium: 8.9 mg/dL (ref 8.9–10.3)
Chloride: 103 mmol/L (ref 98–111)
Creatinine, Ser: 0.65 mg/dL (ref 0.44–1.00)
GFR calc Af Amer: >60 mL/min (ref >60)
GFR calc non Af Amer: >60 mL/min (ref >60)
Glucose, Bld: 288 mg/dL — ABNORMAL HIGH (ref 70–99)
Potassium: 3.7 mmol/L (ref 3.5–5.1)
Sodium: 141 mmol/L (ref 135–145)
Total Bilirubin: 0.8 mg/dL (ref 0.3–1.2)
Total Protein: 7.1 g/dL (ref 6.5–8.1)

## 2018-09-01 LAB — CBC
HCT: 37.6 % (ref 36.0–46.0)
HCT: 38.3 % (ref 36.0–46.0)
Hemoglobin: 12.2 g/dL (ref 12.0–15.0)
Hemoglobin: 12.4 g/dL (ref 12.0–15.0)
MCH: 31.5 pg (ref 26.0–34.0)
MCH: 31.2 pg (ref 26.0–34.0)
MCHC: 32.4 g/dL (ref 30.0–36.0)
MCHC: 32.4 g/dL (ref 30.0–36.0)
MCV: 97.2 fL (ref 80.0–100.0)
MCV: 96.5 fL (ref 80.0–100.0)
Platelets: 220 10*3/uL (ref 150–400)
Platelets: 217 10*3/uL (ref 150–400)
RBC: 3.87 MIL/uL (ref 3.87–5.11)
RBC: 3.97 MIL/uL (ref 3.87–5.11)
RDW: 12.8 % (ref 11.5–15.5)
RDW: 12.7 % (ref 11.5–15.5)
WBC: 13.8 10*3/uL — ABNORMAL HIGH (ref 4.0–10.5)
WBC: 11.7 10*3/uL — ABNORMAL HIGH (ref 4.0–10.5)
nRBC: 0 % (ref 0.0–0.2)
nRBC: 0 % (ref 0.0–0.2)

## 2018-09-01 LAB — HEMOGLOBIN A1C
Hgb A1c MFr Bld: 9.5 % — ABNORMAL HIGH (ref 4.8–5.6)
Mean Plasma Glucose: 225.95 mg/dL

## 2018-09-01 LAB — BLOOD GAS, VENOUS
Acid-base deficit: 5.2 mmol/L — ABNORMAL HIGH (ref 0.0–2.0)
Bicarbonate: 23.8 mmol/L (ref 20.0–28.0)
Delivery systems: POSITIVE
FIO2: 0.3
O2 Saturation: 20.6 %
Patient temperature: 37
pCO2, Ven: 61 mmHg — ABNORMAL HIGH (ref 44.0–60.0)
pH, Ven: 7.2 — ABNORMAL LOW (ref 7.250–7.430)
pO2, Ven: <31 mmHg — CL (ref 32.0–45.0)

## 2018-09-01 LAB — BASIC METABOLIC PANEL
Anion gap: 12 (ref 5–15)
BUN: 15 mg/dL (ref 6–20)
CO2: 24 mmol/L (ref 22–32)
Calcium: 9.1 mg/dL (ref 8.9–10.3)
Chloride: 106 mmol/L (ref 98–111)
Creatinine, Ser: 0.43 mg/dL — ABNORMAL LOW (ref 0.44–1.00)
GFR calc Af Amer: >60 mL/min (ref >60)
GFR calc non Af Amer: >60 mL/min (ref >60)
Glucose, Bld: 199 mg/dL — ABNORMAL HIGH (ref 70–99)
Potassium: 3.2 mmol/L — ABNORMAL LOW (ref 3.5–5.1)
Sodium: 142 mmol/L (ref 135–145)

## 2018-09-01 LAB — ECHOCARDIOGRAM COMPLETE
Height: 63 in
Weight: 3136 oz

## 2018-09-01 LAB — GLUCOSE, CAPILLARY
Glucose-Capillary: 304 mg/dL — ABNORMAL HIGH (ref 70–99)
Glucose-Capillary: 234 mg/dL — ABNORMAL HIGH (ref 70–99)
Glucose-Capillary: 183 mg/dL — ABNORMAL HIGH (ref 70–99)
Glucose-Capillary: 126 mg/dL — ABNORMAL HIGH (ref 70–99)
Glucose-Capillary: 103 mg/dL — ABNORMAL HIGH (ref 70–99)
Glucose-Capillary: 128 mg/dL — ABNORMAL HIGH (ref 70–99)

## 2018-09-01 LAB — MRSA PCR SCREENING: MRSA by PCR: NEGATIVE

## 2018-09-01 LAB — PROCALCITONIN: Procalcitonin: <0.1 ng/mL

## 2018-09-01 LAB — BRAIN NATRIURETIC PEPTIDE: B Natriuretic Peptide: 1590 pg/mL — ABNORMAL HIGH (ref 0.0–100.0)

## 2018-09-01 LAB — LACTIC ACID, PLASMA: Lactic Acid, Venous: 2.7 mmol/L (ref 0.5–1.9)

## 2018-09-01 LAB — SARS CORONAVIRUS 2 BY RT PCR (HOSPITAL ORDER, PERFORMED IN ~~LOC~~ HOSPITAL LAB): SARS Coronavirus 2: NEGATIVE

## 2018-09-01 MED ORDER — ONDANSETRON HCL 4 MG/2ML IJ SOLN: 4.0000 mg | Freq: Four times a day (QID) | INTRAMUSCULAR | Status: DC | PRN

## 2018-09-01 MED ORDER — CHLORHEXIDINE GLUCONATE CLOTH 2 % EX PADS
6.0000 | MEDICATED_PAD | Freq: Every day | CUTANEOUS | Status: DC
  Administered 2018-09-01 – 2018-09-02 (×2): 6 via TOPICAL

## 2018-09-01 MED ORDER — ACETAMINOPHEN 325 MG PO TABS
650.0000 mg | ORAL_TABLET | ORAL | Status: DC | PRN
  Administered 2018-09-01 – 2018-09-02 (×2): 650 mg via ORAL
  Filled 2018-09-01 (×2): qty 2

## 2018-09-01 MED ORDER — INSULIN ASPART 100 UNIT/ML ~~LOC~~ SOLN
0.0000 [IU] | SUBCUTANEOUS | Status: DC
  Administered 2018-09-01 (×2): 2 [IU] via SUBCUTANEOUS
  Administered 2018-09-01 (×2): 5 [IU] via SUBCUTANEOUS
  Administered 2018-09-02: 2 [IU] via SUBCUTANEOUS
  Administered 2018-09-02 (×2): 5 [IU] via SUBCUTANEOUS
  Administered 2018-09-02: 3 [IU] via SUBCUTANEOUS
  Administered 2018-09-02 – 2018-09-03 (×3): 2 [IU] via SUBCUTANEOUS
  Filled 2018-09-01 (×11): qty 1

## 2018-09-01 MED ORDER — ENOXAPARIN SODIUM 40 MG/0.4ML ~~LOC~~ SOLN
40.0000 mg | SUBCUTANEOUS | Status: DC
  Administered 2018-09-01 – 2018-09-03 (×3): 40 mg via SUBCUTANEOUS
  Filled 2018-09-01 (×3): qty 0.4

## 2018-09-01 MED ORDER — POTASSIUM CHLORIDE 20 MEQ PO PACK
40.0000 meq | PACK | Freq: Two times a day (BID) | ORAL | Status: AC
  Administered 2018-09-01 (×2): 40 meq via ORAL
  Filled 2018-09-01 (×2): qty 2

## 2018-09-01 MED ORDER — FUROSEMIDE 10 MG/ML IJ SOLN
40.0000 mg | Freq: Every day | INTRAMUSCULAR | Status: DC
  Administered 2018-09-01 – 2018-09-03 (×3): 40 mg via INTRAVENOUS
  Filled 2018-09-01 (×3): qty 4

## 2018-09-01 NOTE — Progress Notes (Addendum)
[place] at [place] NAME: Taylor [name]    MR#:  [id number]  DATE OF BIRTH:  May 26, 1965  SUBJECTIVE:  CHIEF COMPLAINT: Off BiPAP, feeling better.  Still smoking  REVIEW OF SYSTEMS:  CONSTITUTIONAL: No fever, fatigue or weakness.  EYES: No blurred or double vision.  EARS, NOSE, AND THROAT: No tinnitus or ear pain.  RESPIRATORY: No cough, shortness of breath, wheezing or hemoptysis.  CARDIOVASCULAR: No chest pain, orthopnea, edema.  GASTROINTESTINAL: No nausea, vomiting, diarrhea or abdominal pain.  GENITOURINARY: No dysuria, hematuria.  ENDOCRINE: No polyuria, nocturia,  HEMATOLOGY: No anemia, easy bruising or bleeding SKIN: No rash or lesion. MUSCULOSKELETAL: No joint pain or arthritis.   NEUROLOGIC: No tingling, numbness, weakness.  PSYCHIATRY: No anxiety or depression.   DRUG ALLERGIES:  No Known Allergies  VITALS:  Blood pressure (!) 124/92, pulse 94, temperature 97.7 F (36.5 C), temperature source Oral, resp. rate (!) 31, height 5\' 3"  (1.6 m), weight 88.9 kg, SpO2 100 %.  PHYSICAL EXAMINATION:  GENERAL:  53 y.o.-year-old patient lying in the bed with no acute distress.  EYES: Pupils equal, round, reactive to light and accommodation. No scleral icterus. Extraocular muscles intact.  HEENT: Head atraumatic, normocephalic. Oropharynx and nasopharynx clear.  NECK:  Supple, no jugular venous distention. No thyroid enlargement, no tenderness.  LUNGS: Moderate breath sounds bilaterally, no wheezing, rales,rhonchi or crepitation. No use of accessory muscles of respiration.  CARDIOVASCULAR: S1, S2 normal. No murmurs, rubs, or gallops.  ABDOMEN: Soft, nontender, nondistended. Bowel sounds present.  EXTREMITIES: No pedal edema, cyanosis, or clubbing.  NEUROLOGIC: Cranial nerves II through XII are intact. Muscle strength 5/5 in all extremities. Sensation intact. Gait not checked.  PSYCHIATRIC: The patient is alert and oriented x 3.   SKIN: No obvious rash, lesion, or ulcer.    LABORATORY PANEL:   CBC Recent Labs  Lab 09/01/18 0646  WBC 11.7*  HGB 12.4  HCT 38.3  PLT 217   ------------------------------------------------------------------------------------------------------------------  Chemistries  Recent Labs  Lab 09/01/18 0233 09/01/18 0646  NA 141 142  K 3.7 3.2*  CL 103 106  CO2 23 24  GLUCOSE 288* 199*  BUN 17 15  CREATININE 0.65 0.43*  CALCIUM 8.9 9.1  AST 197*  --   ALT 136*  --   ALKPHOS 158*  --   BILITOT 0.8  --    ------------------------------------------------------------------------------------------------------------------  Cardiac Enzymes No results for input(s): TROPONINI in the last 168 hours. ------------------------------------------------------------------------------------------------------------------  RADIOLOGY:  Dg Chest Port 1 View  Result Date: 08/31/2018 CLINICAL DATA:  Shortness of breath EXAM: PORTABLE CHEST 1 VIEW COMPARISON:  08/30/2015 FINDINGS: Cardiomegaly with vascular congestion and diffuse bilateral interstitial and hazy opacities suspicious for edema. No large effusion. No pneumothorax or focal consolidation. IMPRESSION: Cardiomegaly with vascular congestion and diffuse bilateral interstitial and hazy opacities suspect pulmonary edema. Electronically Signed   By: Donavan Foil M.D.   On: 08/31/2018 23:12    EKG:   Orders placed or performed during the hospital encounter of 08/31/18  . EKG 12-Lead  . EKG 12-Lead  . EKG 12-Lead  . EKG 12-Lead    ASSESSMENT AND PLAN:   #Acute respiratory failure with hypoxia secondary to acute on chronic systolic CHF exacerbation possible underlying pneumonia Off BiPAP Clinically feeling better. Continue oxygen and wean off as tolerated Status post IV Lasix Continue Rocephin and azithromycin  #Acute on chronic systolic CHF IV Lasix, echocardiogram, Daily weight monitoring, intake and output   #Diabetes  mellitus-sliding  scale  #Obstructive sleep apnea continue BiPAP/CPAP nightly  #Tobacco abuse disorder counseled patient to quit smoking for 5 minutes.  She verbalized understanding of the plan but she is not considering nicotine patch at this time  All the records are reviewed and case discussed with Care Management/Social Workerr. Management plans discussed with the patient, family and they are in agreement.  CODE STATUS: fc   TOTAL TIME TAKING CARE OF THIS PATIENT: 37  minutes.   POSSIBLE D/C IN 2 DAYS, DEPENDING ON CLINICAL CONDITION.  Note: This dictation was prepared with Dragon dictation along with smaller phrase technology. Any transcriptional errors that result from this process are unintentional.   Ramonita LabAruna Anish Vana M.D on 09/01/2018 at 4:33 PM  Between 7am to 6pm - Pager - [phone] After 6pm go to [email or web address] - password [email or web address] [email or web address]  [facility]  Office  [phone]  CC: Primary care physician; Lauro RegulusAnderson, Marshall W, MD

## 2018-09-01 NOTE — Progress Notes (Signed)
Family Meeting Note  Advance Directive:yes  Today a meeting took place with the Patient.    The following clinical team members were present during this meeting:MD  The following were discussed:Patient's diagnosis: Acute pulmonary edema with possible pneumonia currently off BiPAP, diabetes mellitus sleep apnea plan of care discussed with the patient.  She verbalized understanding of the plan.    Patient's progosis: Unable to determine and Goals for treatment: Full Code  Son Fredrich Birks is healthcare power of attorney  Additional follow-up to be provided: Hospitalist and pulmonology  Time spent during discussion:17  IIN  Nicholes Mango, MD

## 2018-09-01 NOTE — Consult Note (Signed)
Name: Taylor [name] MRN: [id number] DOB: 1965-07-18    ADMISSION DATE:  08/31/2018 CONSULTATION DATE:  08/31/2018  REFERRING MD :  Dr. Jannifer Franklin  CHIEF COMPLAINT:  Shortness of Breath  BRIEF PATIENT DESCRIPTION:  53 y.o. Female admitted with Acute Hypoxic Respiratory Failure in setting of Acute Decompensated HFrEF and questionable Community Acquired Pneumonia requiring BiPAP.  SIGNIFICANT EVENTS  7/15>>Admission to Stepdown  STUDIES:  Echocardiogram 7/15>>   CULTURES: Blood x2 7/14>> Urine 7/15>> Sputum 7/15>> Strep pneumo urinary antigen 7/15>> Legionella urinary antigen 7/15>> SARS-CoV-2 PCR 7/14>> Negative  ANTIBIOTICS: Azithromycin 7/14>> Rocephin 7/14>>  HISTORY OF PRESENT ILLNESS:   Taylor [name] is a 53 y.o. Female with a PMH notable for HFrEF, OSA, and Diabetes mellitus who presents to [facility] ED on 08/31/18 with complaints of progressive shortness of breath.  She reports that it began approximately 2 days ago, and has been gradual in onset.  She also reports that her shortness of breath is worse with walking and when lying down supine.  Upon EMS arrival she was noted to be hypoxic with O2 sats of 78% on room air, thus she was placed on CPAP. She denied chest pain, cough, fever, chills, or sick contacts.  Upon presentation to the ED she was noted to be in respiratory distress with bibasilar crackles and peripheral edema, so she was subsequently placed on BiPAP.  She was also noted to be hypothermic and tachycardic, necessitating sepsis workup.  Initial workup in the ED revealed WBC 14.8, Lactic acid 4.1, negative procalcitonin, Alkaline phosphatase 160, AST 252, ALT 131, glucose 469, and potassium 3.3.  Venous blood gas with pH 7.2/ CO2 61 / O2 <31/ Bicarb 23.8.  Her SARS-CoV-2 PCR is negative.  CXR is concerning for pulmonary edema.  She was given 40 mg IV Lasix and broad spectrum antibiotics for possible pneumonia.  She is admitted to [facility] unit for further workup and treatment of  Acute Hypoxic Respiratory Failure in setting of Acute Decompensated HFrEF and questionable Community Acquired Pneumonia requiring BiPAP.  PCCM is consulted for further management.  PAST MEDICAL HISTORY :   has a past medical history of CHF (congestive heart failure) ([place]), Diabetes mellitus without complication ([place]), and Obstructive sleep apnea.  has a past surgical history that includes Abdominal hysterectomy. Prior to Admission medications   Medication Sig Start Date End Date Taking? Authorizing Provider  atorvastatin (LIPITOR) 40 MG tablet Take 1 tablet (40 mg total) by mouth daily. 03/10/18   Alisa Graff, FNP  carvedilol (COREG) 12.5 MG tablet Take 1 tablet (12.5 mg total) by mouth 2 (two) times daily with a meal. 03/10/18   Alisa Graff, FNP  empagliflozin (JARDIANCE) 10 MG TABS tablet Take 10 mg by mouth daily.    [provider]  furosemide (LASIX) 20 MG tablet Take 1 tablet (20 mg total) by mouth daily. 03/10/18   Alisa Graff, FNP  glimepiride (AMARYL) 2 MG tablet Take 2 tablets by mouth 2 (two) times daily. 02/15/15   [provider]  Insulin Detemir (LEVEMIR FLEXTOUCH) 100 UNIT/ML Pen Inject 25 Units into the skin at bedtime. 04/23/17 04/23/18  [provider]  losartan (COZAAR) 100 MG tablet Take 1 tablet (100 mg total) by mouth daily. 03/10/18   Alisa Graff, FNP  metFORMIN (GLUCOPHAGE) 1000 MG tablet Take 1 tablet (1,000 mg total) 2 (two) times daily with a meal by mouth. 12/31/16   Alisa Graff, FNP   No Known Allergies  FAMILY HISTORY:  family  history is not on file. SOCIAL HISTORY:  reports that she has quit smoking. She has never used smokeless tobacco. She reports that she does not drink alcohol or use drugs.   COVID-19 DISASTER DECLARATION:  FULL CONTACT PHYSICAL EXAMINATION WAS NOT POSSIBLE DUE TO TREATMENT OF COVID-19 AND  CONSERVATION OF PERSONAL PROTECTIVE EQUIPMENT, LIMITED EXAM FINDINGS INCLUDE-  Patient assessed or the  symptoms described in the history of present illness.  In the context of the Global COVID-19 pandemic, which necessitated consideration that the patient might be at risk for infection with the SARS-CoV-2 virus that causes COVID-19, Institutional protocols and algorithms that pertain to the evaluation of patients at risk for COVID-19 are in a state of rapid change based on information released by regulatory bodies including the CDC and federal and state organizations. These policies and algorithms were followed during the patient's care while in hospital.  REVIEW OF SYSTEMS:  Positives in BOLD: Pt currently denies all complaints Constitutional: Negative for fever, chills, weight loss, malaise/fatigue and diaphoresis.  HENT: Negative for hearing loss, ear pain, nosebleeds, congestion, sore throat, neck pain, tinnitus and ear discharge.   Eyes: Negative for blurred vision, double vision, photophobia, pain, discharge and redness.  Respiratory: Negative for cough, hemoptysis, sputum production, shortness of breath, wheezing and stridor.   Cardiovascular: Negative for chest pain, palpitations, orthopnea, claudication, leg swelling and PND.  Gastrointestinal: Negative for heartburn, nausea, vomiting, abdominal pain, diarrhea, constipation, blood in stool and melena.  Genitourinary: Negative for dysuria, urgency, frequency, hematuria and flank pain.  Musculoskeletal: Negative for myalgias, back pain, joint pain and falls.  Skin: Negative for itching and rash.  Neurological: Negative for dizziness, tingling, tremors, sensory change, speech change, focal weakness, seizures, loss of consciousness, weakness and headaches.  Endo/Heme/Allergies: Negative for environmental allergies and polydipsia. Does not bruise/bleed easily.  SUBJECTIVE:  Pt on BiPAP, tolerating Denies SOB, chest pain, cough, sputum production, wheezing, lower extremity edema  VITAL SIGNS: Pulse Rate:  [99-117] 99 (07/15 0030) Resp:   [25-39] 31 (07/15 0030) BP: (97-136)/(66-79) 97/71 (07/15 0030) SpO2:  [91 %-97 %] 96 % (07/15 0030) Weight:  [88.9 kg] 88.9 kg (07/14 2241)  PHYSICAL EXAMINATION: General: Acutely ill-appearing female, laying in bed, on BiPAP, in no acute distress Neuro: Arouses to voice, alert and oriented, follows commands, no focal deficits, speech clear, pupils PERRLA HEENT: Atraumatic, normocephalic, neck supple, no JVD Cardiovascular: Tachycardia ,regular rhythm, S1-S2, no murmurs rubs or gallops, 2+ pulses Lungs:  Rales auscultated bilaterally, BiPAP assisted, even, no accessory muscle use Abdomen: Soft, nontender, nondistended, no guarding or rebound tenderness, bowel sounds positive x4 Musculoskeletal: Normal bulk and tone, no deformities, no edema Skin: Warm and dry, no obvious rashes lesions or ulcerations  Recent Labs  Lab 08/31/18 2251  NA 140  K 3.3*  CL 103  CO2 24  BUN 12  CREATININE 0.67  GLUCOSE 469*   Recent Labs  Lab 08/31/18 2251  HGB 12.9  HCT 41.1  WBC 14.8*  PLT 265   Dg Chest Port 1 View  Result Date: 08/31/2018 CLINICAL DATA:  Shortness of breath EXAM: PORTABLE CHEST 1 VIEW COMPARISON:  08/30/2015 FINDINGS: Cardiomegaly with vascular congestion and diffuse bilateral interstitial and hazy opacities suspicious for edema. No large effusion. No pneumothorax or focal consolidation. IMPRESSION: Cardiomegaly with vascular congestion and diffuse bilateral interstitial and hazy opacities suspect pulmonary edema. Electronically Signed   By: Jasmine PangKim  Fujinaga M.D.   On: 08/31/2018 23:12    ASSESSMENT / PLAN:  Acute Hypoxic Respiratory Failure  in the setting of Acute Decompensated HFrEF & ? Pneumonia Hx: OSA -Supplemental O2 as needed to maintain O2 sats>92% -BiPAP, wean as tolerated -Follow intermittent CXR & ABG as needed -IV Lasix as BP and renal function permits -Continue Azithromycin & Rocephin for now  Acute on Chronic HFrEF -Cardiac monitoring -Maintain MAP  >65 -IV lasix as tolerated -Trend BNP -Obtain Echocardiogram  Meets SIRS Criteria ? Pneumonia Lactic Acidosis -Monitor fever curve -Trend WBC's and Procalcitonin >>Procalcitonin negative -Follow cultures as above -Continue Azithromycin & Rocephin for now -Trend lactic acid  Elevated LFT's -NPO for now -Trend LFT's  Diabetes Mellitus -CBG's -SSI -Follow ICU Hypo/hyperglycemia protocol    DISPOSITION: Stepdown GOALS OF CARE: Full Code VTE PROPHYLAXIS: Lovenox SQ UPDATES: Updated pt at bedside 09/01/18  Harlon DittyJeremiah Vianey Caniglia, [facility]NP-BC [facility] Pager: [phone] Cell: [phone]  09/01/2018, 12:51 AM
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
## 2018-09-01 NOTE — ED Notes (Signed)
[name] notified of patient condition.

## 2018-09-01 NOTE — Progress Notes (Signed)
Called RN. Patient does not need fluid bolus due to CHF. Patient was actually given Lasix.

## 2018-09-01 NOTE — ED Notes (Signed)
ED TO INPATIENT HANDOFF REPORT  ED Nurse Name and Phone #: Joelene Millin [phone]  S Name/Age/Gender Taylor [name] 53 y.o. female Room/Bed: ED01A/ED01A  Code Status   Code Status: Prior  Home/SNF/Other Home Patient oriented to: self, place, time and situation Is this baseline? Yes   Triage Complete: Triage complete  Chief Complaint Breathing Difficulty  Triage Note Patient from home, c/o of sob hx of chf. Patient sating 78% on room air at home placed on cpap in route to ed. Patient alert, non verbal, working to breath and diaphoretic.    Allergies No Known Allergies  Level of Care/Admitting Diagnosis ED Disposition    ED Disposition Condition [facility] Area: [place] [[id number]]  Level of Care: ICU [6]  Covid Evaluation: Person Under Investigation (PUI)  Diagnosis: Sepsis [facility]) [[id number]]  Admitting Physician: Lance Coon [[id number]]  Attending Physician: Lance Coon [phone]  Estimated length of stay: past midnight tomorrow  Certification:: I certify this patient will need inpatient services for at least 2 midnights  PT Class (Do Not Modify): Inpatient [101]  PT Acc Code (Do Not Modify): Private [1]       B Medical/Surgery History Past Medical History:  Diagnosis Date  . CHF (congestive heart failure) ([place])   . Diabetes mellitus without complication ([place])   . Obstructive sleep apnea    Past Surgical History:  Procedure Laterality Date  . ABDOMINAL HYSTERECTOMY       A IV Location/Drains/Wounds Patient Lines/Drains/Airways Status   Active Line/Drains/Airways    Name:   Placement date:   Placement time:   Site:   Days:   Peripheral IV Anterior;Distal;Right Forearm   -    -    Forearm      Peripheral IV 08/31/18 Left Antecubital   08/31/18    2318    Antecubital   1          Intake/Output Last 24 hours No intake or output data in the 24 hours ending 09/01/18 0053  Labs/Imaging Results for orders placed or  performed during the hospital encounter of 08/31/18 (from the past 48 hour(s))  Lactic acid, plasma     Status: Abnormal   Collection Time: 08/31/18 10:45 PM  Result Value Ref Range   Lactic Acid, Venous 4.1 (HH) 0.5 - 1.9 mmol/L    Comment: CRITICAL RESULT CALLED TO, READ BACK BY AND VERIFIED WITH KIM Fabrice Dyal RN AT 2330 ON 08/31/2018 SNG Performed at [facility], [address] [place], [place] [address]   Comprehensive metabolic panel     Status: Abnormal   Collection Time: 08/31/18 10:51 PM  Result Value Ref Range   Sodium 140 135 - 145 mmol/L   Potassium 3.3 (L) 3.5 - 5.1 mmol/L   Chloride 103 98 - 111 mmol/L   CO2 24 22 - 32 mmol/L   Glucose, Bld 469 (H) 70 - 99 mg/dL   BUN 12 6 - 20 mg/dL   Creatinine, Ser 0.67 0.44 - 1.00 mg/dL   Calcium 8.8 (L) 8.9 - 10.3 mg/dL   Total Protein 7.0 6.5 - 8.1 g/dL   Albumin 4.0 3.5 - 5.0 g/dL   AST 252 (H) 15 - 41 U/L   ALT 131 (H) 0 - 44 U/L   Alkaline Phosphatase 160 (H) 38 - 126 U/L   Total Bilirubin 1.0 0.3 - 1.2 mg/dL   GFR calc non Af Amer >60 >60 mL/min   GFR calc Af Amer >60 >60 mL/min  Anion gap 13 5 - 15    Comment: Performed at [facility], [address] [place], [place] [address]  Lipase, blood     Status: None   Collection Time: 08/31/18 10:51 PM  Result Value Ref Range   Lipase 25 11 - 51 U/L    Comment: Performed at [facility], [address] [place], [place] [address]  CBC WITH DIFFERENTIAL     Status: Abnormal   Collection Time: 08/31/18 10:51 PM  Result Value Ref Range   WBC 14.8 (H) 4.0 - 10.5 K/uL   RBC 4.12 3.87 - 5.11 MIL/uL   Hemoglobin 12.9 12.0 - 15.0 g/dL   HCT 29.541.1 62.136.0 - 30.846.0 %   MCV 99.8 80.0 - 100.0 fL   MCH 31.3 26.0 - 34.0 pg   MCHC 31.4 30.0 - 36.0 g/dL   RDW 65.712.7 84.611.5 - 96.215.5 %   Platelets 265 150 - 400 K/uL   nRBC 0.0 0.0 - 0.2 %   Neutrophils Relative % 75 %   Neutro Abs 11.2 (H) 1.7 - 7.7 K/uL   Lymphocytes Relative 16 %   Lymphs Abs 2.4 0.7 - 4.0 K/uL    Monocytes Relative 5 %   Monocytes Absolute 0.7 0.1 - 1.0 K/uL   Eosinophils Relative 2 %   Eosinophils Absolute 0.3 0.0 - 0.5 K/uL   Basophils Relative 1 %   Basophils Absolute 0.1 0.0 - 0.1 K/uL   Immature Granulocytes 1 %   Abs Immature Granulocytes 0.08 (H) 0.00 - 0.07 K/uL    Comment: Performed at [facility], [address] [place], [place] [address]  Procalcitonin     Status: None   Collection Time: 08/31/18 10:51 PM  Result Value Ref Range   Procalcitonin <0.10 ng/mL    Comment:        Interpretation: PCT (Procalcitonin) <= 0.5 ng/mL: Systemic infection (sepsis) is not likely. Local bacterial infection is possible. (NOTE)       Sepsis PCT Algorithm           Lower Respiratory Tract                                      Infection PCT Algorithm    ----------------------------     ----------------------------         PCT < 0.25 ng/mL                PCT < 0.10 ng/mL         Strongly encourage             Strongly discourage   discontinuation of antibiotics    initiation of antibiotics    ----------------------------     -----------------------------       PCT 0.25 - 0.50 ng/mL            PCT 0.10 - 0.25 ng/mL               OR       >80% decrease in PCT            Discourage initiation of                                            antibiotics      Encourage discontinuation  of antibiotics    ----------------------------     -----------------------------         PCT >= 0.50 ng/mL              PCT 0.26 - 0.50 ng/mL               AND        <80% decrease in PCT             Encourage initiation of                                             antibiotics       Encourage continuation           of antibiotics    ----------------------------     -----------------------------        PCT >= 0.50 ng/mL                  PCT > 0.50 ng/mL               AND         increase in PCT                  Strongly encourage                                      initiation  of antibiotics    Strongly encourage escalation           of antibiotics                                     -----------------------------                                           PCT <= 0.25 ng/mL                                                 OR                                        > 80% decrease in PCT                                     Discontinue / Do not initiate                                             antibiotics Performed at [facility], [address] [place], [place] [address]   APTT     Status: None   Collection Time: 08/31/18 10:51 PM  Result Value Ref Range   aPTT 27 24 - 36 seconds    Comment: Performed  at [facility], [address] [place], [place] [address]  Protime-INR     Status: None   Collection Time: 08/31/18 10:51 PM  Result Value Ref Range   Prothrombin Time 13.3 11.4 - 15.2 seconds   INR 1.0 0.8 - 1.2    Comment: (NOTE) INR goal varies based on device and disease states. Performed at [facility], [address] [place], [place] [address]   Blood gas, venous ([facility], [facility], [facility])     Status: Abnormal   Collection Time: 08/31/18 10:52 PM  Result Value Ref Range   FIO2 0.30    Delivery systems BILEVEL POSITIVE AIRWAY PRESSURE    pH, Ven 7.20 (L) 7.250 - 7.430   pCO2, Ven 61 (H) 44.0 - 60.0 mmHg   pO2, Ven <31.0 (LL) 32.0 - 45.0 mmHg    Comment: CRITICAL RESULT CALLED TO, READ BACK BY AND VERIFIED WITH: Ardell Makarewicz, RN AT 0040 ON 09/01/18 [facility], RRT    Bicarbonate 23.8 20.0 - 28.0 mmol/L   Acid-base deficit 5.2 (H) 0.0 - 2.0 mmol/L   O2 Saturation 20.6 %   Patient temperature 37.0    Collection site VEIN    Sample type VENOUS     Comment: Performed at [facility], [address] [place], [place] [address]  SARS Coronavirus 2 (CEPHEID- Performed in [facility] hospital lab), Hosp Order     Status: None   Collection Time: 08/31/18 10:53 PM   Specimen: Nasopharyngeal Swab  Result Value Ref Range   SARS  Coronavirus 2 NEGATIVE NEGATIVE    Comment: (NOTE) If result is NEGATIVE SARS-CoV-2 target nucleic acids are NOT DETECTED. The SARS-CoV-2 RNA is generally detectable in upper and lower  respiratory specimens during the acute phase of infection. The lowest  concentration of SARS-CoV-2 viral copies this assay can detect is 250  copies / mL. A negative result does not preclude SARS-CoV-2 infection  and should not be used as the sole basis for treatment or other  patient management decisions.  A negative result may occur with  improper specimen collection / handling, submission of specimen other  than nasopharyngeal swab, presence of viral mutation(s) within the  areas targeted by this assay, and inadequate number of viral copies  (<250 copies / mL). A negative result must be combined with clinical  observations, patient history, and epidemiological information. If result is POSITIVE SARS-CoV-2 target nucleic acids are DETECTED. The SARS-CoV-2 RNA is generally detectable in upper and lower  respiratory specimens dur ing the acute phase of infection.  Positive  results are indicative of active infection with SARS-CoV-2.  Clinical  correlation with patient history and other diagnostic information is  necessary to determine patient infection status.  Positive results do  not rule out bacterial infection or co-infection with other viruses. If result is PRESUMPTIVE POSTIVE SARS-CoV-2 nucleic acids MAY BE PRESENT.   A presumptive positive result was obtained on the submitted specimen  and confirmed on repeat testing.  While 2019 novel coronavirus  (SARS-CoV-2) nucleic acids may be present in the submitted sample  additional confirmatory testing may be necessary for epidemiological  and / or clinical management purposes  to differentiate between  SARS-CoV-2 and other Sarbecovirus currently known to infect humans.  If clinically indicated additional testing with an alternate test  methodology  [phone] is advised. The SARS-CoV-2 RNA is generally  detectable in upper and lower respiratory sp ecimens during the acute  phase of infection. The expected result is Negative. Fact Sheet for  Patients:  BoilerBrush.com.cy Fact Sheet for Healthcare Providers: https://pope.com/ This test is not yet approved or cleared by the [place] FDA and has been authorized for detection and/or diagnosis of SARS-CoV-2 by FDA under an Emergency Use Authorization (EUA).  This EUA will remain in effect (meaning this test can be used) for the duration of the COVID-19 declaration under Section 564(b)(1) of the Act, 21 U.S.C. section 360bbb-3(b)(1), unless the authorization is terminated or revoked sooner. Performed at [facility], [address] [place], [place] [address]    Dg Chest Port 1 View  Result Date: 08/31/2018 CLINICAL DATA:  Shortness of breath EXAM: PORTABLE CHEST 1 VIEW COMPARISON:  08/30/2015 FINDINGS: Cardiomegaly with vascular congestion and diffuse bilateral interstitial and hazy opacities suspicious for edema. No large effusion. No pneumothorax or focal consolidation. IMPRESSION: Cardiomegaly with vascular congestion and diffuse bilateral interstitial and hazy opacities suspect pulmonary edema. Electronically Signed   By: Jasmine Pang M.D.   On: 08/31/2018 23:12    Pending Labs Unresulted Labs (From admission, onward)    Start     Ordered   09/01/18 0043  Lactic acid, plasma  ONCE - STAT,   STAT     09/01/18 0042   08/31/18 2246  Blood Culture (routine x 2)  BLOOD CULTURE X 2,   STAT     08/31/18 2245   08/31/18 2246  Urinalysis, Complete w Microscopic  ONCE - STAT,   STAT     08/31/18 2245   08/31/18 2246  Urine culture  ONCE - STAT,   STAT     08/31/18 2245   Signed and Held  HIV antibody (Routine Testing)  Once,   R     Signed and Held   Signed and Held  CBC  (enoxaparin (LOVENOX)    CrCl >/= 30 ml/min)  Once,    R    Comments: Baseline for enoxaparin therapy IF NOT ALREADY DRAWN.  Notify MD if PLT < 100 K.    Signed and Held   Signed and Held  Creatinine, serum  (enoxaparin (LOVENOX)    CrCl >/= 30 ml/min)  Once,   R    Comments: Baseline for enoxaparin therapy IF NOT ALREADY DRAWN.    Signed and Held   Signed and Held  Creatinine, serum  (enoxaparin (LOVENOX)    CrCl >/= 30 ml/min)  Weekly,   R    Comments: while on enoxaparin therapy    Signed and Held   Signed and Held  CBC  Tomorrow morning,   R     Signed and Held   Signed and Held  Basic metabolic panel  Tomorrow morning,   R     Signed and Held          Vitals/Pain Today's Vitals   08/31/18 2345 09/01/18 0000 09/01/18 0015 09/01/18 0030  BP:  104/79  97/71  Pulse: (!) 108  (!) 108 99  Resp: (!) 37 (!) 36 (!) 34 (!) 31  SpO2: 95%  94% 96%  Weight:      Height:      PainSc:        Isolation Precautions No active isolations  Medications Medications  cefTRIAXone (ROCEPHIN) 2 g in sodium chloride 0.9 % 100 mL IVPB (0 g Intravenous Stopped 09/01/18 0014)  azithromycin (ZITHROMAX) 500 mg in sodium chloride 0.9 % 250 mL IVPB (0 mg Intravenous Stopped 09/01/18 0052)  furosemide (LASIX) injection 40 mg (40 mg Intravenous Given 08/31/18 2317)    Mobility  walks High fall risk   Focused Assessments Pulmonary Assessment Handoff:  Lung sounds: Bilateral Breath Sounds: Diminished, Fine crackles O2 Device: CPAP        R Recommendations: See Admitting Provider Note  Report given to:   Additional Notes:

## 2018-09-01 NOTE — Progress Notes (Signed)
*  PRELIMINARY RESULTS* Echocardiogram 2D Echocardiogram has been performed.  Taylor [name] 09/01/2018, 10:19 AM

## 2018-09-02 LAB — URINALYSIS, COMPLETE (UACMP) WITH MICROSCOPIC
Bacteria, UA: NONE SEEN
Bilirubin Urine: NEGATIVE
Glucose, UA: NEGATIVE mg/dL
Hgb urine dipstick: NEGATIVE
Ketones, ur: NEGATIVE mg/dL
Leukocytes,Ua: NEGATIVE
Nitrite: NEGATIVE
Protein, ur: 30 mg/dL — AB
Specific Gravity, Urine: 1.006 (ref 1.005–1.030)
pH: 5 (ref 5.0–8.0)

## 2018-09-02 LAB — BASIC METABOLIC PANEL
Anion gap: 11 (ref 5–15)
BUN: 13 mg/dL (ref 6–20)
CO2: 26 mmol/L (ref 22–32)
Calcium: 9.2 mg/dL (ref 8.9–10.3)
Chloride: 105 mmol/L (ref 98–111)
Creatinine, Ser: 0.55 mg/dL (ref 0.44–1.00)
GFR calc Af Amer: >60 mL/min (ref >60)
GFR calc non Af Amer: >60 mL/min (ref >60)
Glucose, Bld: 124 mg/dL — ABNORMAL HIGH (ref 70–99)
Potassium: 3.9 mmol/L (ref 3.5–5.1)
Sodium: 142 mmol/L (ref 135–145)

## 2018-09-02 LAB — GLUCOSE, CAPILLARY
Glucose-Capillary: 108 mg/dL — ABNORMAL HIGH (ref 70–99)
Glucose-Capillary: 132 mg/dL — ABNORMAL HIGH (ref 70–99)
Glucose-Capillary: 140 mg/dL — ABNORMAL HIGH (ref 70–99)
Glucose-Capillary: 151 mg/dL — ABNORMAL HIGH (ref 70–99)
Glucose-Capillary: 203 mg/dL — ABNORMAL HIGH (ref 70–99)
Glucose-Capillary: 227 mg/dL — ABNORMAL HIGH (ref 70–99)

## 2018-09-02 LAB — STREP PNEUMONIAE URINARY ANTIGEN: Strep Pneumo Urinary Antigen: NEGATIVE

## 2018-09-02 LAB — MAGNESIUM: Magnesium: 1.6 mg/dL — ABNORMAL LOW (ref 1.7–2.4)

## 2018-09-02 LAB — HIV ANTIBODY (ROUTINE TESTING W REFLEX): HIV Screen 4th Generation wRfx: NONREACTIVE

## 2018-09-02 MED ORDER — SODIUM CHLORIDE 0.9 % IV SOLN
INTRAVENOUS | Status: DC | PRN
  Administered 2018-09-02: 10:00:00 5 mL via INTRAVENOUS

## 2018-09-02 MED ORDER — ASPIRIN EC 81 MG PO TBEC
81.0000 mg | DELAYED_RELEASE_TABLET | Freq: Every day | ORAL | Status: DC
  Administered 2018-09-02 – 2018-09-03 (×2): 81 mg via ORAL
  Filled 2018-09-02 (×2): qty 1

## 2018-09-02 MED ORDER — MAGNESIUM SULFATE 2 GM/50ML IV SOLN
2.0000 g | Freq: Once | INTRAVENOUS | Status: AC
  Administered 2018-09-02: 10:00:00 2 g via INTRAVENOUS
  Filled 2018-09-02: qty 50

## 2018-09-02 NOTE — TOC Initial Note (Signed)
Transition of Care [facility]) - Initial/Assessment Note    Patient Details  Name: Taylor [name] MRN: [id number] Date of Birth: [birth year]  Transition of Care [facility]) CM/SW Contact:    Shelbie Hutching, RN Phone Number: 09/02/2018, 10:08 AM  Clinical Narrative:                 Patient admitted with acute respiratory failure.  Patient is doing well today, reports she feels better.  Patient is from home in [place] where she lives with a friend.  Patient is independent in ADL's and requires no assistive devices.  Patient drives and works at the [employer] in [employer].  Patient has a history of CHF and is current with the heart failure clinic.  PCP is Dr. Ouida Sills- patient was scheduled for an appointment today at 3 pm.  [facility] called MD office to cancel today's appointment.  Dr. Tonette Bihari office will call patient to reschedule.  No discharge needs identified at this time.   Expected Discharge Plan: Home/Self Care Barriers to Discharge: Continued Medical Work up   Patient Goals and CMS Choice Patient states their goals for this hospitalization and ongoing recovery are:: Patient wants to get well and go home- she reports she learned her lesson and knows she needs to quit smoking      Expected Discharge Plan and Services Expected Discharge Plan: Home/Self Care       Living arrangements for the past 2 months: Apartment                                      Prior Living Arrangements/Services Living arrangements for the past 2 months: Apartment Lives with:: Roommate Patient language and need for interpreter reviewed:: No Do you feel safe going back to the place where you live?: Yes      Need for Family Participation in Patient Care: Yes (Comment)(support with CHF and smoking cessation) Care giver support system in place?: Yes (comment)(son and roommate)   Criminal Activity/Legal Involvement Pertinent to Current Situation/Hospitalization: No - Comment as needed  Activities of  Daily Living Home Assistive Devices/Equipment: None ADL Screening (condition at time of admission) Patient's cognitive ability adequate to safely complete daily activities?: Yes Is the patient deaf or have difficulty hearing?: No Does the patient have difficulty seeing, even when wearing glasses/contacts?: No Does the patient have difficulty concentrating, remembering, or making decisions?: No Patient able to express need for assistance with ADLs?: Yes Does the patient have difficulty dressing or bathing?: No Independently performs ADLs?: Yes (appropriate for developmental age) Does the patient have difficulty walking or climbing stairs?: No Weakness of Legs: None Weakness of Arms/Hands: None  Permission Sought/Granted                  Emotional Assessment Appearance:: Appears stated age Attitude/Demeanor/Rapport: Engaged Affect (typically observed): Accepting Orientation: : Oriented to Self, Oriented to Place, Oriented to  Time, Oriented to Situation Alcohol / Substance Use: Tobacco Use Psych Involvement: No (comment)  Admission diagnosis:  Acute respiratory failure with hypoxia (HCC) [J96.01] Acute on chronic congestive heart failure, unspecified heart failure type [facility]) [I50.9] Patient Active Problem List   Diagnosis Date Noted  . Sepsis ([place]) 08/31/2018  . CAP (community acquired pneumonia) 08/31/2018  . Acute respiratory failure with hypoxia ([place]) 08/31/2018  . Obstructive sleep apnea 09/03/2016  . Diabetes ([place]) 09/14/2015  . Tachycardia 09/14/2015  . Acute on chronic systolic  CHF (congestive heart failure) (HCC) 08/30/2015   PCP:  Lauro RegulusAnderson, Marshall W, MD Pharmacy:   [facility] [id number] [facility] - [place], [place] - [address] [place] [address] Phone: [phone] Fax: [phone]     Social Determinants of Health (SDOH) Interventions    Readmission Risk Interventions No flowsheet data found.

## 2018-09-02 NOTE — Progress Notes (Signed)
cpap refused 

## 2018-09-02 NOTE — Progress Notes (Signed)
[facility] [place] NAME: Taylor [name]    MR#:  [id number]  DATE OF BIRTH:  Apr 22, 1965  SUBJECTIVE:  CHIEF COMPLAINT: Off BiPAP, sob with exertion , otherwise ok Still smoking  REVIEW OF SYSTEMS:  CONSTITUTIONAL: No fever, fatigue or weakness.  EYES: No blurred or double vision.  EARS, NOSE, AND THROAT: No tinnitus or ear pain.  RESPIRATORY: No cough, shortness of breath with exertion, no  wheezing or hemoptysis.  CARDIOVASCULAR: No chest pain, orthopnea, edema.  GASTROINTESTINAL: No nausea, vomiting, diarrhea or abdominal pain.  GENITOURINARY: No dysuria, hematuria.  ENDOCRINE: No polyuria, nocturia,  HEMATOLOGY: No anemia, easy bruising or bleeding SKIN: No rash or lesion. MUSCULOSKELETAL: No joint pain or arthritis.   NEUROLOGIC: No tingling, numbness, weakness.  PSYCHIATRY: No anxiety or depression.   DRUG ALLERGIES:  No Known Allergies  VITALS:  Blood pressure 91/72, pulse 98, temperature 98.6 F (37 C), resp. rate 19, height 5\' 3"  (1.6 m), weight 66.3 kg, SpO2 99 %.  PHYSICAL EXAMINATION:  GENERAL:  53 y.o.-year-old patient lying in the bed with no acute distress.  EYES: Pupils equal, round, reactive to light and accommodation. No scleral icterus. Extraocular muscles intact.  HEENT: Head atraumatic, normocephalic. Oropharynx and nasopharynx clear.  NECK:  Supple, no jugular venous distention. No thyroid enlargement, no tenderness.  LUNGS: Moderate breath sounds bilaterally, no wheezing, rales,rhonchi or crepitation. No use of accessory muscles of respiration.  CARDIOVASCULAR: S1, S2 normal. No murmurs, rubs, or gallops.  ABDOMEN: Soft, nontender, nondistended. Bowel sounds present.  EXTREMITIES: No pedal edema, cyanosis, or clubbing.  NEUROLOGIC: Cranial nerves II through XII are intact. Muscle strength 5/5 in all extremities. Sensation intact. Gait not checked.  PSYCHIATRIC: The patient is alert and oriented x 3.   SKIN: No obvious rash, lesion, or ulcer.    LABORATORY PANEL:   CBC Recent Labs  Lab 09/01/18 0646  WBC 11.7*  HGB 12.4  HCT 38.3  PLT 217   ------------------------------------------------------------------------------------------------------------------  Chemistries  Recent Labs  Lab 09/01/18 0233  09/02/18 0435  NA 141   < > 142  K 3.7   < > 3.9  CL 103   < > 105  CO2 23   < > 26  GLUCOSE 288*   < > 124*  BUN 17   < > 13  CREATININE 0.65   < > 0.55  CALCIUM 8.9   < > 9.2  MG  --   --  1.6*  AST 197*  --   --   ALT 136*  --   --   ALKPHOS 158*  --   --   BILITOT 0.8  --   --    < > = values in this interval not displayed.   ------------------------------------------------------------------------------------------------------------------  Cardiac Enzymes No results for input(s): TROPONINI in the last 168 hours. ------------------------------------------------------------------------------------------------------------------  RADIOLOGY:  Dg Chest Port 1 View  Result Date: 08/31/2018 CLINICAL DATA:  Shortness of breath EXAM: PORTABLE CHEST 1 VIEW COMPARISON:  08/30/2015 FINDINGS: Cardiomegaly with vascular congestion and diffuse bilateral interstitial and hazy opacities suspicious for edema. No large effusion. No pneumothorax or focal consolidation. IMPRESSION: Cardiomegaly with vascular congestion and diffuse bilateral interstitial and hazy opacities suspect pulmonary edema. Electronically Signed   By: Donavan Foil M.D.   On: 08/31/2018 23:12    EKG:   Orders placed or performed during the hospital encounter of 08/31/18  . EKG 12-Lead  . EKG 12-Lead  . EKG 12-Lead  .  EKG 12-Lead    ASSESSMENT AND PLAN:   #Acute respiratory failure with hypoxia secondary to acute on chronic systolic CHF exacerbation possible underlying pneumonia Off BiPAP Clinically feeling better. Continue oxygen and wean off as tolerated  IV Lasix Continue Rocephin and  azithromycin  #Acute on chronic systolic CHF IV Lasix, Daily weight monitoring, intake and output Echocardiogram with 20 to 25% ejection fraction.  Left ventricular global hypokinesis without regional wall motion abnormalities Aspirin 81 mg enteric-coated is added to the regimen.  Blood pressure being soft cannot add beta-blocker or ACE inhibitor at this point of time eventually will consider to add when blood pressure is stable.  #Diabetes mellitus-sliding scale  #Obstructive sleep apnea continue BiPAP/CPAP nightly  #Hypomagnesemia replete and recheck in a.m.  #Tobacco abuse disorder counseled patient to quit smoking for 5 minutes.  She verbalized understanding of the plan but she is not considering nicotine patch at this time  All the records are reviewed and case discussed with Care Management/Social Workerr. Management plans discussed with the patient, family and they are in agreement.  CODE STATUS: fc   TOTAL TIME TAKING CARE OF THIS PATIENT: 37  minutes.   POSSIBLE D/C IN 1 DAYS, DEPENDING ON CLINICAL CONDITION.  Note: This dictation was prepared with Dragon dictation along with smaller phrase technology. Any transcriptional errors that result from this process are unintentional.   Ramonita LabAruna Ezekial Arns M.D on 09/02/2018 at 2:03 PM  Between 7am to 6pm - Pager - [phone] After 6pm go to [email or web address] - password [email or web address] [facility]  [facility]  Office  [phone]  CC: Primary care physician; Lauro RegulusAnderson, Marshall W, MD

## 2018-09-03 LAB — GLUCOSE, CAPILLARY
Glucose-Capillary: 123 mg/dL — ABNORMAL HIGH (ref 70–99)
Glucose-Capillary: 118 mg/dL — ABNORMAL HIGH (ref 70–99)
Glucose-Capillary: 134 mg/dL — ABNORMAL HIGH (ref 70–99)

## 2018-09-03 LAB — LEGIONELLA PNEUMOPHILA SEROGP 1 UR AG: L. pneumophila Serogp 1 Ur Ag: NEGATIVE

## 2018-09-03 LAB — URINE CULTURE: Culture: NO GROWTH

## 2018-09-03 LAB — MAGNESIUM: Magnesium: 1.8 mg/dL (ref 1.7–2.4)

## 2018-09-03 MED ORDER — POTASSIUM CHLORIDE ER 10 MEQ PO TBCR: 20.0000 meq | EXTENDED_RELEASE_TABLET | Freq: Every day | ORAL | 0 refills | Status: DC

## 2018-09-03 MED ORDER — FUROSEMIDE 20 MG PO TABS: 20.0000 mg | ORAL_TABLET | Freq: Two times a day (BID) | ORAL | 3 refills | Status: DC

## 2018-09-03 MED ORDER — AMOXICILLIN-POT CLAVULANATE 875-125 MG PO TABS: 1.0000 | ORAL_TABLET | Freq: Two times a day (BID) | ORAL | 0 refills | Status: DC

## 2018-09-03 MED ORDER — ASPIRIN 81 MG PO TBEC: 81.0000 mg | DELAYED_RELEASE_TABLET | Freq: Every day | ORAL | Status: AC

## 2018-09-03 MED ORDER — AMOXICILLIN-POT CLAVULANATE 875-125 MG PO TABS
1.0000 | ORAL_TABLET | Freq: Two times a day (BID) | ORAL | Status: DC
  Administered 2018-09-03: 1 via ORAL
  Filled 2018-09-03: qty 1

## 2018-09-03 NOTE — Progress Notes (Signed)
MD order received in [facility] to discharge pt home today; verbally reviewed AVS with pt, Rxs e-filed, pt to purchase Aspirin 81mg  OTC; no questions voiced at this time; pt discharged via wheelchair to the [facility] entrance

## 2018-09-03 NOTE — Discharge Summary (Signed)
[facility] [place] NAME: Kenetra Hildenbrand    MR#:  [id number]  DATE OF BIRTH:  [birth year]  DATE OF ADMISSION:  08/31/2018 ADMITTING PHYSICIAN: Lance Coon, MD  DATE OF DISCHARGE: 09/03/2018 PRIMARY CARE PHYSICIAN: Kirk Ruths, MD    ADMISSION DIAGNOSIS:  Acute respiratory failure with hypoxia (HCC) [J96.01] Acute on chronic congestive heart failure, unspecified heart failure type ([place]) [I50.9]  DISCHARGE DIAGNOSIS:  Principal Problem:   Acute respiratory failure with hypoxia (HCC) Active Problems:   Acute on chronic systolic CHF (congestive heart failure) (HCC)   Diabetes ([place])   Obstructive sleep apnea   SECONDARY DIAGNOSIS:   Past Medical History:  Diagnosis Date  . CHF (congestive heart failure) ([place])   . Diabetes mellitus without complication ([place])   . Obstructive sleep apnea     HOSPITAL COURSE:  Taylor [name]  is a 53 y.o. female who presents with chief complaint as above.  Patient presents to the ED with a complaint of worsening shortness of breath.  She states that this is been getting worse over the past 2 to 3 days.  Today she reached a point where she was very dyspneic doing even the simplest movement.  Here in the ED work-up shows significant amount of pulmonary edema.  She did require BiPAP for oxygenation as she was initially hypoxic.  She does have a history of CHF.  She also had an elevated lactic acid and an elevated white blood cell count, so she was initially given antibiotics for possible occult pneumonia as well.  Hospitalist were called for admission   Acute respiratory failure with hypoxia secondary to acute on chronic systolic CHF exacerbation possible underlying pneumonia Off BiPAP Clinically feeling better. Weaned off oxygen patient is on room air  IV Lasix changed to p.o. Lasix  Rocephin and azithromycin given during the hospital course and discharge home with p.o. Augmentin urine culture is  pending primary care physician to follow-up on the  #Acute on chronic systolic CHF IV Lasix, Daily weight monitoring, intake and output Echocardiogram with 20 to 25% ejection fraction.  Left ventricular global hypokinesis without regional wall motion abnormalities Aspirin 81 mg enteric-coated is added to the regimen.  Blood pressure being soft cannot continue ACE inhibitor at this point of time eventually will consider to add when blood pressure is stable.  Continue home medication Coreg Discharge home with p.o. Lasix, potassium supplements and baby aspirin Outpatient follow-up with primary cardiology-Dr. Montine Circle   #Diabetes mellitus-sliding scale, diabetic diet needs strict control Follow-up with primary endocrinology Resume home medications including long-acting insulin   #Obstructive sleep apnea continue BiPAP/CPAP nightly  #Hypomagnesemia repleted  #Tobacco abuse disorder counseled patient to quit smoking for 5 minutes.  She verbalized understanding of the plan but she is not considering nicotine patch at this time DISCHARGE CONDITIONS:   Stable   CONSULTS OBTAINED:     PROCEDURES n one   DRUG ALLERGIES:  No Known Allergies  DISCHARGE MEDICATIONS:   Allergies as of 09/03/2018   No Known Allergies     Medication List    STOP taking these medications   losartan 100 MG tablet Commonly known as: COZAAR     TAKE these medications   amoxicillin-clavulanate 875-125 MG tablet Commonly known as: AUGMENTIN Take 1 tablet by mouth every 12 (twelve) hours.   aspirin 81 MG EC tablet Take 1 tablet (81 mg total) by mouth daily. Start taking on: September 04, 2018   atorvastatin 40  MG tablet Commonly known as: LIPITOR Take 1 tablet (40 mg total) by mouth daily.   carvedilol 12.5 MG tablet Commonly known as: COREG Take 1 tablet (12.5 mg total) by mouth 2 (two) times daily with a meal.   furosemide 20 MG tablet Commonly known as: LASIX Take 1 tablet (20 mg total) by  mouth 2 (two) times daily. What changed: when to take this   glimepiride 2 MG tablet Commonly known as: AMARYL Take 2 tablets by mouth 2 (two) times daily.   insulin detemir 100 unit/ml Soln Commonly known as: LEVEMIR Inject 25 Units into the skin at bedtime.   Jardiance 25 MG Tabs tablet Generic drug: empagliflozin Take 25 mg by mouth daily.   metFORMIN 1000 MG tablet Commonly known as: GLUCOPHAGE Take 1 tablet (1,000 mg total) 2 (two) times daily with a meal by mouth.   potassium chloride 10 MEQ tablet Commonly known as: K-DUR Take 2 tablets (20 mEq total) by mouth daily.        DISCHARGE INSTRUCTIONS:  Follow-up with primary care physician in 2 3 days Follow-up with cardiology Dr. Gwen PoundsKowalski in 1 week Follow-up with CHF clinic in 3 days Daily weight monitoring, intake and output Follow-up with primary endocrinology in 2 weeks  make an appointment   DIET:  Cardiac diet and Diabetic diet  DISCHARGE CONDITION:  Fair  ACTIVITY:  Activity as tolerated  OXYGEN:  Home Oxygen: No.   Oxygen Delivery: room air  DISCHARGE LOCATION:  home   If you experience worsening of your admission symptoms, develop shortness of breath, life threatening emergency, suicidal or homicidal thoughts you must seek medical attention immediately by calling 911 or calling your MD immediately  if symptoms less severe.  You Must read complete instructions/literature along with all the possible adverse reactions/side effects for all the Medicines you take and that have been prescribed to you. Take any new Medicines after you have completely understood and accpet all the possible adverse reactions/side effects.   Please note  You were cared for by a hospitalist during your hospital stay. If you have any questions about your discharge medications or the care you received while you were in the hospital after you are discharged, you can call the unit and asked to speak with the hospitalist on call  if the hospitalist that took care of you is not available. Once you are discharged, your primary care physician will handle any further medical issues. Please note that NO REFILLS for any discharge medications will be authorized once you are discharged, as it is imperative that you return to your primary care physician (or establish a relationship with a primary care physician if you do not have one) for your aftercare needs so that they can reassess your need for medications and monitor your lab values.     Today  Chief Complaint  Patient presents with  . Shortness of Breath   Patient is doing fine.  Denies any complaints.  Denies any chest pain shortness of breath and wants to go home  ROS:  CONSTITUTIONAL: Denies fevers, chills. Denies any fatigue, weakness.  EYES: Denies blurry vision, double vision, eye pain. EARS, NOSE, THROAT: Denies tinnitus, ear pain, hearing loss. RESPIRATORY: Denies cough, wheeze, shortness of breath.  CARDIOVASCULAR: Denies chest pain, palpitations, edema.  GASTROINTESTINAL: Denies nausea, vomiting, diarrhea, abdominal pain. Denies bright red blood per rectum. GENITOURINARY: Denies dysuria, hematuria. ENDOCRINE: Denies nocturia or thyroid problems. HEMATOLOGIC AND LYMPHATIC: Denies easy bruising or bleeding. SKIN: Denies rash or  lesion. MUSCULOSKELETAL: Denies pain in neck, back, shoulder, knees, hips or arthritic symptoms.  NEUROLOGIC: Denies paralysis, paresthesias.  PSYCHIATRIC: Denies anxiety or depressive symptoms.   VITAL SIGNS:  Blood pressure 110/87, pulse 95, temperature 97.9 F (36.6 C), temperature source Oral, resp. rate 18, height 5\' 3"  (1.6 m), weight 65.6 kg, SpO2 98 %.  I/O:    Intake/Output Summary (Last 24 hours) at 09/03/2018 0955 Last data filed at 09/02/2018 1413 Gross per 24 hour  Intake 298.33 ml  Output -  Net 298.33 ml    PHYSICAL EXAMINATION:  GENERAL:  53 y.o.-year-old patient lying in the bed with no acute distress.   EYES: Pupils equal, round, reactive to light and accommodation. No scleral icterus. Extraocular muscles intact.  HEENT: Head atraumatic, normocephalic. Oropharynx and nasopharynx clear.  NECK:  Supple, no jugular venous distention. No thyroid enlargement, no tenderness.  LUNGS: Normal breath sounds bilaterally, no wheezing, rales,rhonchi or crepitation. No use of accessory muscles of respiration.  CARDIOVASCULAR: S1, S2 normal. No murmurs, rubs, or gallops.  ABDOMEN: Soft, non-tender, non-distended. Bowel sounds present.   EXTREMITIES: No pedal edema, cyanosis, or clubbing.  NEUROLOGIC: Cranial nerves II through XII are intact. Muscle strength 5/5 in all extremities. Sensation intact. Gait not checked.  PSYCHIATRIC: The patient is alert and oriented x 3.  SKIN: No obvious rash, lesion, or ulcer.   DATA REVIEW:   CBC Recent Labs  Lab 09/01/18 0646  WBC 11.7*  HGB 12.4  HCT 38.3  PLT 217    Chemistries  Recent Labs  Lab 09/01/18 0233  09/02/18 0435 09/03/18 0615  NA 141   < > 142  --   K 3.7   < > 3.9  --   CL 103   < > 105  --   CO2 23   < > 26  --   GLUCOSE 288*   < > 124*  --   BUN 17   < > 13  --   CREATININE 0.65   < > 0.55  --   CALCIUM 8.9   < > 9.2  --   MG  --    < > 1.6* 1.8  AST 197*  --   --   --   ALT 136*  --   --   --   ALKPHOS 158*  --   --   --   BILITOT 0.8  --   --   --    < > = values in this interval not displayed.    Cardiac Enzymes No results for input(s): TROPONINI in the last 168 hours.  Microbiology Results  Results for orders placed or performed during the hospital encounter of 08/31/18  Blood Culture (routine x 2)     Status: None (Preliminary result)   Collection Time: 08/31/18 10:45 PM   Specimen: BLOOD  Result Value Ref Range Status   Specimen Description BLOOD LEFT WRIST  Final   Special Requests   Final    BOTTLES DRAWN AEROBIC AND ANAEROBIC Blood Culture adequate volume   Culture   Final    NO GROWTH 3 DAYS Performed at  [facility], [address] [place], [place] [address]    Report Status PENDING  Incomplete  Blood Culture (routine x 2)     Status: None (Preliminary result)   Collection Time: 08/31/18 10:45 PM   Specimen: BLOOD  Result Value Ref Range Status   Specimen Description BLOOD RIGHT ASSIST CONTROL  Final   Special Requests  Final    BOTTLES DRAWN AEROBIC AND ANAEROBIC Blood Culture adequate volume   Culture   Final    NO GROWTH 3 DAYS Performed at [facility], [address] [place], [place] [address]    Report Status PENDING  Incomplete  SARS Coronavirus 2 (CEPHEID- Performed in [facility] hospital lab), Hosp Order     Status: None   Collection Time: 08/31/18 10:53 PM   Specimen: Nasopharyngeal Swab  Result Value Ref Range Status   SARS Coronavirus 2 NEGATIVE NEGATIVE Final    Comment: (NOTE) If result is NEGATIVE SARS-CoV-2 target nucleic acids are NOT DETECTED. The SARS-CoV-2 RNA is generally detectable in upper and lower  respiratory specimens during the acute phase of infection. The lowest  concentration of SARS-CoV-2 viral copies this assay can detect is 250  copies / mL. A negative result does not preclude SARS-CoV-2 infection  and should not be used as the sole basis for treatment or other  patient management decisions.  A negative result may occur with  improper specimen collection / handling, submission of specimen other  than nasopharyngeal swab, presence of viral mutation(s) within the  areas targeted by this assay, and inadequate number of viral copies  (<250 copies / mL). A negative result must be combined with clinical  observations, patient history, and epidemiological information. If result is POSITIVE SARS-CoV-2 target nucleic acids are DETECTED. The SARS-CoV-2 RNA is generally detectable in upper and lower  respiratory specimens dur ing the acute phase of infection.  Positive  results are indicative of active infection with SARS-CoV-2.   Clinical  correlation with patient history and other diagnostic information is  necessary to determine patient infection status.  Positive results do  not rule out bacterial infection or co-infection with other viruses. If result is PRESUMPTIVE POSTIVE SARS-CoV-2 nucleic acids MAY BE PRESENT.   A presumptive positive result was obtained on the submitted specimen  and confirmed on repeat testing.  While 2019 novel coronavirus  (SARS-CoV-2) nucleic acids may be present in the submitted sample  additional confirmatory testing may be necessary for epidemiological  and / or clinical management purposes  to differentiate between  SARS-CoV-2 and other Sarbecovirus currently known to infect humans.  If clinically indicated additional testing with an alternate test  methodology [phone]) is advised. The SARS-CoV-2 RNA is generally  detectable in upper and lower respiratory sp ecimens during the acute  phase of infection. The expected result is Negative. Fact Sheet for Patients:  BoilerBrush.com.cy Fact Sheet for Healthcare Providers: https://pope.com/ This test is not yet approved or cleared by the [place] FDA and has been authorized for detection and/or diagnosis of SARS-CoV-2 by FDA under an Emergency Use Authorization (EUA).  This EUA will remain in effect (meaning this test can be used) for the duration of the COVID-19 declaration under Section 564(b)(1) of the Act, 21 U.S.C. section 360bbb-3(b)(1), unless the authorization is terminated or revoked sooner. Performed at [facility], [address] [place], [place] [address]   MRSA PCR Screening     Status: None   Collection Time: 09/01/18  2:00 AM   Specimen: Nasal Mucosa; Nasopharyngeal  Result Value Ref Range Status   MRSA by PCR NEGATIVE NEGATIVE Final    Comment:        The GeneXpert MRSA Assay (FDA approved for NASAL specimens only), is one component of  a comprehensive MRSA colonization surveillance program. It is not intended to diagnose MRSA infection nor to guide or monitor treatment for  MRSA infections. Performed at [facility], [address] [place], [place] [address]     RADIOLOGY:  Dg Chest Port 1 View  Result Date: 08/31/2018 CLINICAL DATA:  Shortness of breath EXAM: PORTABLE CHEST 1 VIEW COMPARISON:  08/30/2015 FINDINGS: Cardiomegaly with vascular congestion and diffuse bilateral interstitial and hazy opacities suspicious for edema. No large effusion. No pneumothorax or focal consolidation. IMPRESSION: Cardiomegaly with vascular congestion and diffuse bilateral interstitial and hazy opacities suspect pulmonary edema. Electronically Signed   By: Jasmine PangKim  Fujinaga M.D.   On: 08/31/2018 23:12    EKG:   Orders placed or performed during the hospital encounter of 08/31/18  . EKG 12-Lead  . EKG 12-Lead  . EKG 12-Lead  . EKG 12-Lead      Management plans discussed with the patient, family and they are in agreement.  CODE STATUS:     Code Status Orders  (From admission, onward)         Start     Ordered   09/01/18 0311  Full code  Continuous     09/01/18 0310        Code Status History    Date Active Date Inactive Code Status Order ID Comments User Context   08/30/2015 2001 08/31/2015 1959 Full Code [id number]  Enedina FinnerPatel, Sona, MD ED   Advance Care Planning Activity      TOTAL TIME TAKING CARE OF THIS PATIENT: 45 minutes.   Note: This dictation was prepared with Dragon dictation along with smaller phrase technology. Any transcriptional errors that result from this process are unintentional.   @[facility] @  on 09/03/2018 at 9:55 AM  Between 7am to 6pm - Pager - [phone]  After 6pm go to [email or web address] - password [email or web address] [facility]  [facility]  Office  [phone]  CC: Primary care physician; Lauro RegulusAnderson, Marshall W, MD

## 2018-09-03 NOTE — Discharge Instructions (Signed)
Follow-up with primary care physician in 2 3 days Follow-up with cardiology Dr. Nehemiah Massed in 1 week Follow-up with CHF clinic in 3 days Daily weight monitoring, intake and output Follow-up with primary endocrinology in 2 weeks  make an appointment

## 2018-09-03 NOTE — Plan of Care (Signed)
  Problem: Health Behavior/Discharge Planning: Goal: Ability to manage health-related needs will improve Outcome: Adequate for Discharge   Problem: Activity: Goal: Risk for activity intolerance will decrease Outcome: Adequate for Discharge   

## 2018-09-05 LAB — CULTURE, BLOOD (ROUTINE X 2)
Culture: NO GROWTH
Culture: NO GROWTH
Special Requests: ADEQUATE
Special Requests: ADEQUATE
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
## 2018-09-08 ENCOUNTER — Ambulatory Visit: Payer: BLUE CROSS/BLUE SHIELD | Attending: Family | Admitting: Family

## 2018-09-08 ENCOUNTER — Encounter: Payer: Self-pay | Admitting: Family

## 2018-09-08 ENCOUNTER — Other Ambulatory Visit: Payer: Self-pay

## 2018-09-08 VITALS — BP 122/86 | HR 101 | Resp 18 | Ht 61.0 in | Wt 147.4 lb

## 2018-09-08 DIAGNOSIS — E119 Type 2 diabetes mellitus without complications: Secondary | ICD-10-CM | POA: Diagnosis not present

## 2018-09-08 DIAGNOSIS — Z79899 Other long term (current) drug therapy: Secondary | ICD-10-CM | POA: Insufficient documentation

## 2018-09-08 DIAGNOSIS — Z87891 Personal history of nicotine dependence: Secondary | ICD-10-CM | POA: Insufficient documentation

## 2018-09-08 DIAGNOSIS — Z7982 Long term (current) use of aspirin: Secondary | ICD-10-CM | POA: Insufficient documentation

## 2018-09-08 DIAGNOSIS — I5022 Chronic systolic (congestive) heart failure: Secondary | ICD-10-CM | POA: Diagnosis not present

## 2018-09-08 DIAGNOSIS — R0602 Shortness of breath: Secondary | ICD-10-CM | POA: Diagnosis present

## 2018-09-08 DIAGNOSIS — Z8249 Family history of ischemic heart disease and other diseases of the circulatory system: Secondary | ICD-10-CM | POA: Diagnosis not present

## 2018-09-08 DIAGNOSIS — G4733 Obstructive sleep apnea (adult) (pediatric): Secondary | ICD-10-CM | POA: Diagnosis not present

## 2018-09-08 DIAGNOSIS — Z794 Long term (current) use of insulin: Secondary | ICD-10-CM | POA: Insufficient documentation

## 2018-09-08 MED ORDER — SACUBITRIL-VALSARTAN 24-26 MG PO TABS: 1.0000 | ORAL_TABLET | Freq: Two times a day (BID) | ORAL | 3 refills | Status: DC

## 2018-09-08 NOTE — Patient Instructions (Addendum)
Continue weighing daily and call for an overnight weight gain of > 2 pounds or a weekly weight gain of >5 pounds.  Begin entresto by taking 1 tablet twice daily 
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
## 2018-10-06 ENCOUNTER — Other Ambulatory Visit: Payer: Self-pay

## 2018-10-06 ENCOUNTER — Ambulatory Visit: Payer: BLUE CROSS/BLUE SHIELD | Attending: Family | Admitting: Family

## 2018-10-06 ENCOUNTER — Encounter: Payer: Self-pay | Admitting: Family

## 2018-10-06 ENCOUNTER — Telehealth: Payer: Self-pay | Admitting: Family

## 2018-10-06 VITALS — BP 136/95 | HR 97 | Resp 18 | Ht 61.0 in | Wt 148.4 lb

## 2018-10-06 DIAGNOSIS — Z87891 Personal history of nicotine dependence: Secondary | ICD-10-CM | POA: Insufficient documentation

## 2018-10-06 DIAGNOSIS — Z7982 Long term (current) use of aspirin: Secondary | ICD-10-CM | POA: Diagnosis not present

## 2018-10-06 DIAGNOSIS — Z79899 Other long term (current) drug therapy: Secondary | ICD-10-CM | POA: Diagnosis not present

## 2018-10-06 DIAGNOSIS — E119 Type 2 diabetes mellitus without complications: Secondary | ICD-10-CM | POA: Insufficient documentation

## 2018-10-06 DIAGNOSIS — G4733 Obstructive sleep apnea (adult) (pediatric): Secondary | ICD-10-CM | POA: Insufficient documentation

## 2018-10-06 DIAGNOSIS — I5022 Chronic systolic (congestive) heart failure: Secondary | ICD-10-CM | POA: Insufficient documentation

## 2018-10-06 DIAGNOSIS — E876 Hypokalemia: Secondary | ICD-10-CM

## 2018-10-06 DIAGNOSIS — Z794 Long term (current) use of insulin: Secondary | ICD-10-CM | POA: Insufficient documentation

## 2018-10-06 LAB — BASIC METABOLIC PANEL
Anion gap: 10 (ref 5–15)
BUN: 9 mg/dL (ref 6–20)
CO2: 32 mmol/L (ref 22–32)
Calcium: 9.1 mg/dL (ref 8.9–10.3)
Chloride: 97 mmol/L — ABNORMAL LOW (ref 98–111)
Creatinine, Ser: 0.49 mg/dL (ref 0.44–1.00)
GFR calc Af Amer: >60 mL/min (ref >60)
GFR calc non Af Amer: >60 mL/min (ref >60)
Glucose, Bld: 324 mg/dL — ABNORMAL HIGH (ref 70–99)
Potassium: 2.6 mmol/L — CL (ref 3.5–5.1)
Sodium: 139 mmol/L (ref 135–145)

## 2018-10-06 MED ORDER — POTASSIUM CHLORIDE CRYS ER 20 MEQ PO TBCR: 20.0000 meq | EXTENDED_RELEASE_TABLET | Freq: Every day | ORAL | 3 refills | Status: DC

## 2018-10-06 MED ORDER — CARVEDILOL 25 MG PO TABS: 25.0000 mg | ORAL_TABLET | Freq: Two times a day (BID) | ORAL | 3 refills | Status: DC

## 2018-10-06 MED ORDER — FUROSEMIDE 20 MG PO TABS: 20.0000 mg | ORAL_TABLET | Freq: Every day | ORAL | 3 refills | Status: DC

## 2018-10-06 NOTE — Telephone Encounter (Signed)
Received phone call from the lab regarding critical potassium value of 2.6. Patient says that she was told to stop the "potassium" when she was discharged from the hospital. When reviewing the d/c summary, it said to stop the losartan. Patient verified that that dose was 100mg . Explained to patient that she did not need to take that medication because she is on entresto.   Will call in potassium 37meq and she was instructed to take 2 tablets daily for the next 2 days and then decrease dose down to 1 tablet daily. She was also advised to decrease her furosemide down to 20mg  daily (was BID).  Will check lab work next week. Patient verbalized understanding of above instructions.

## 2018-10-06 NOTE — Patient Instructions (Signed)
Continue weighing daily and call for an overnight weight gain of > 2 pounds or a weekly weight gain of >5 pounds. 

## 2018-10-13 ENCOUNTER — Telehealth: Payer: Self-pay | Admitting: Family

## 2018-10-13 ENCOUNTER — Other Ambulatory Visit: Payer: Self-pay

## 2018-10-13 ENCOUNTER — Other Ambulatory Visit
Admission: RE | Admit: 2018-10-13 | Discharge: 2018-10-13 | Disposition: A | Discharge status: Home / Self Care | Payer: BLUE CROSS/BLUE SHIELD | Source: Ambulatory Visit | Attending: Family | Admitting: Family

## 2018-10-13 DIAGNOSIS — E876 Hypokalemia: Secondary | ICD-10-CM | POA: Insufficient documentation

## 2018-10-13 LAB — BASIC METABOLIC PANEL
Anion gap: 11 (ref 5–15)
BUN: 10 mg/dL (ref 6–20)
CO2: 26 mmol/L (ref 22–32)
Calcium: 9.1 mg/dL (ref 8.9–10.3)
Chloride: 100 mmol/L (ref 98–111)
Creatinine, Ser: 0.49 mg/dL (ref 0.44–1.00)
GFR calc Af Amer: >60 mL/min (ref >60)
GFR calc non Af Amer: >60 mL/min (ref >60)
Glucose, Bld: 429 mg/dL — ABNORMAL HIGH (ref 70–99)
Potassium: 4.2 mmol/L (ref 3.5–5.1)
Sodium: 137 mmol/L (ref 135–145)

## 2018-10-13 NOTE — Telephone Encounter (Signed)
Reviewed lab results that were drawn today. Renal function looks good and potassium has now normalized. She is to continue taking potassium 104meq daily along with furosemide 20mg  daily. Advised her that her glucose level was high (429).   She says that last night she ate fried chicken and creamed potatoes and this morning she ate a sausage biscuit. Having trouble with her glucometer giving her a reading and she was instructed to take it back to the pharmacy to see if they can look at. She says that she's replaced her batteries and that it's a new meter. She does see endocrinology next month.
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
## 2018-11-03 ENCOUNTER — Other Ambulatory Visit: Payer: Self-pay

## 2018-11-03 ENCOUNTER — Encounter: Payer: Self-pay | Admitting: Family

## 2018-11-03 ENCOUNTER — Ambulatory Visit: Payer: BLUE CROSS/BLUE SHIELD | Attending: Family | Admitting: Family

## 2018-11-03 VITALS — BP 109/75 | HR 90 | Resp 18 | Ht 61.0 in | Wt 147.0 lb

## 2018-11-03 DIAGNOSIS — E119 Type 2 diabetes mellitus without complications: Secondary | ICD-10-CM | POA: Diagnosis not present

## 2018-11-03 DIAGNOSIS — I5022 Chronic systolic (congestive) heart failure: Secondary | ICD-10-CM | POA: Diagnosis not present

## 2018-11-03 DIAGNOSIS — Z79899 Other long term (current) drug therapy: Secondary | ICD-10-CM | POA: Diagnosis not present

## 2018-11-03 DIAGNOSIS — Z87891 Personal history of nicotine dependence: Secondary | ICD-10-CM | POA: Insufficient documentation

## 2018-11-03 DIAGNOSIS — G4733 Obstructive sleep apnea (adult) (pediatric): Secondary | ICD-10-CM | POA: Diagnosis not present

## 2018-11-03 DIAGNOSIS — Z794 Long term (current) use of insulin: Secondary | ICD-10-CM | POA: Insufficient documentation

## 2018-11-03 DIAGNOSIS — R0602 Shortness of breath: Secondary | ICD-10-CM | POA: Diagnosis present

## 2018-11-03 DIAGNOSIS — Z7982 Long term (current) use of aspirin: Secondary | ICD-10-CM | POA: Insufficient documentation

## 2018-11-03 LAB — GLUCOSE, CAPILLARY: Glucose-Capillary: 240 mg/dL — ABNORMAL HIGH (ref 70–99)

## 2018-11-03 NOTE — Patient Instructions (Signed)
Continue weighing daily and call for an overnight weight gain of >2 pounds or a weekly weight gain of >5 pounds.  Take lasix as needed for weight gain >2 pounds overnight, >5 pounds in 1 week, shortness of breath, or leg swelling. Take your potassium pill if you take your fluid pill.

## 2018-12-08 ENCOUNTER — Ambulatory Visit: Payer: BLUE CROSS/BLUE SHIELD | Attending: Family | Admitting: Family

## 2018-12-08 ENCOUNTER — Other Ambulatory Visit: Payer: Self-pay

## 2018-12-08 ENCOUNTER — Encounter: Payer: Self-pay | Admitting: Family

## 2018-12-08 VITALS — BP 99/70 | HR 83 | Resp 20 | Ht 61.0 in | Wt 148.2 lb

## 2018-12-08 DIAGNOSIS — Z79899 Other long term (current) drug therapy: Secondary | ICD-10-CM | POA: Diagnosis not present

## 2018-12-08 DIAGNOSIS — I5022 Chronic systolic (congestive) heart failure: Secondary | ICD-10-CM | POA: Diagnosis present

## 2018-12-08 DIAGNOSIS — Z90711 Acquired absence of uterus with remaining cervical stump: Secondary | ICD-10-CM | POA: Diagnosis not present

## 2018-12-08 DIAGNOSIS — Z87891 Personal history of nicotine dependence: Secondary | ICD-10-CM | POA: Diagnosis not present

## 2018-12-08 DIAGNOSIS — Z7982 Long term (current) use of aspirin: Secondary | ICD-10-CM | POA: Diagnosis not present

## 2018-12-08 DIAGNOSIS — Z794 Long term (current) use of insulin: Secondary | ICD-10-CM | POA: Diagnosis not present

## 2018-12-08 DIAGNOSIS — E119 Type 2 diabetes mellitus without complications: Secondary | ICD-10-CM | POA: Insufficient documentation

## 2018-12-08 DIAGNOSIS — G4733 Obstructive sleep apnea (adult) (pediatric): Secondary | ICD-10-CM | POA: Insufficient documentation

## 2018-12-08 NOTE — Patient Instructions (Addendum)
Continue weighing daily and call for an overnight weight gain of >2 pounds or a weekly weight gain of >5 pounds.  Talk with your endocrinologist about changing jardiance to [place]. Both medications are good for people with heart failure.
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
## 2019-02-07 ENCOUNTER — Other Ambulatory Visit: Payer: Self-pay | Admitting: Family

## 2019-03-14 NOTE — Progress Notes (Signed)
Patient ID: Taylor [name], female    DOB: [birth year], 54 y.o.   MRN: [id number]  HPI Taylor [name] is a 54 y/o female with a history of diabetes, obstructive sleep apnea, remote tobacco use and chronic heart failure.  Echo report from 09/01/2018 reviewed and showed an EF of 20-25%. Echo report from 07/08/17 reviewed and showed and EF of 45-50% along with mild MR. Last echo was done 08/31/15 and showed an EF of 20% without valvular regurgitation.   Has not been admitted or been in the ED in the last 6 months.   She presents today with a chief complaint of a follow-up visit. She currently has no symptoms and specifically denies any difficulty sleeping, dizziness, abdominal distention, palpitations, pedal edema, chest pain, shortness of breath, cough, fatigue or weight gain.   Is taking her furosemide/potassium as needed and thinks she's taken it twice in the last month.   Past Medical History:  Diagnosis Date  . CHF (congestive heart failure) ([place])   . Diabetes mellitus without complication ([place])   . Obstructive sleep apnea    Past Surgical History:  Procedure Laterality Date  . ABDOMINAL HYSTERECTOMY     Family History  Problem Relation Age of Onset  . Anemia Neg Hx   . Arrhythmia Neg Hx   . Asthma Neg Hx   . Clotting disorder Neg Hx   . Fainting Neg Hx   . Heart attack Neg Hx   . Heart disease Neg Hx   . Heart failure Neg Hx   . Hyperlipidemia Neg Hx   . Hypertension Neg Hx    Social History   Tobacco Use  . Smoking status: Former Research scientist (life sciences)  . Smokeless tobacco: Never Used  Substance Use Topics  . Alcohol use: No    Comment: occ   No Known Allergies  Prior to Admission medications   Medication Sig Start Date End Date Taking? Authorizing Provider  aspirin EC 81 MG EC tablet Take 1 tablet (81 mg total) by mouth daily. 09/04/18  Yes Gouru, Illene Silver, MD  atorvastatin (LIPITOR) 40 MG tablet Take 1 tablet (40 mg total) by mouth daily. 03/10/18  Yes Darylene Price A, FNP  carvedilol  (COREG) 25 MG tablet Take 1 tablet (25 mg total) by mouth 2 (two) times daily. 10/06/18 03/16/19 Yes Abem Shaddix A, FNP  empagliflozin (JARDIANCE) 25 MG TABS tablet Take 25 mg by mouth daily. 03/16/19  Yes Darylene Price A, FNP  furosemide (LASIX) 20 MG tablet Take 1 tablet (20 mg total) by mouth daily. Patient taking differently: Take 20 mg by mouth as needed. Weight gain 2 pounds overnight, 5 pounds in a week, shortness of breath, or leg swelling 10/06/18  Yes Gurleen Larrivee A, FNP  glimepiride (AMARYL) 2 MG tablet Take 2 tablets by mouth 2 (two) times daily. 02/15/15  Yes [provider]  insulin detemir (LEVEMIR) 100 unit/ml SOLN Inject 30 Units into the skin at bedtime.    Yes [provider]  metFORMIN (GLUCOPHAGE) 1000 MG tablet Take 1 tablet (1,000 mg total) 2 (two) times daily with a meal by mouth. 12/31/16  Yes Christyne Mccain A, FNP  potassium chloride SA (K-DUR) 20 MEQ tablet Take 1 tablet (20 mEq total) by mouth daily. Take 2 tablets daily for today and tomorrow only and then take 1 tablet daily Patient taking differently: Take 20 mEq by mouth as needed. Take only if you take your lasix (fluid) pill. 10/06/18  Yes Alisa Graff, FNP  sacubitril-valsartan (  ENTRESTO) 24-26 MG Take 1 tablet by mouth 2 (two) times daily. 03/16/19  Yes Delma Freeze, FNP    Review of Systems  Constitutional: Negative for appetite change and fatigue.  HENT: Negative for congestion, postnasal drip and sore throat.   Eyes: Negative.   Respiratory: Negative for cough, chest tightness, shortness of breath and wheezing.   Cardiovascular: Negative for chest pain, palpitations and leg swelling.  Gastrointestinal: Negative for abdominal distention and abdominal pain.  Endocrine: Negative.   Genitourinary: Negative.   Musculoskeletal: Negative for back pain and neck pain.  Skin: Negative.   Allergic/Immunologic: Negative.   Neurological: Negative for dizziness and light-headedness.   Hematological: Negative for adenopathy. Does not bruise/bleed easily.  Psychiatric/Behavioral: Negative for dysphoric mood and sleep disturbance (sleeping on 2 pillows). The patient is not nervous/anxious.    Vitals:   03/16/19 0859  BP: 105/76  Pulse: 86  Resp: 18  SpO2: 94%  Weight: 146 lb 3.2 oz (66.3 kg)  Height: 5\' 1"  (1.549 m)   Wt Readings from Last 3 Encounters:  03/16/19 146 lb 3.2 oz (66.3 kg)  12/08/18 148 lb 3.2 oz (67.2 kg)  11/03/18 147 lb (66.7 kg)   Lab Results  Component Value Date   CREATININE 0.49 10/13/2018   CREATININE 0.49 10/06/2018   CREATININE 0.55 09/02/2018    Physical Exam  Constitutional: She is oriented to person, place, and time. She appears well-developed and well-nourished.  HENT:  Head: Normocephalic and atraumatic.  Neck: No JVD present.  Cardiovascular: Normal rate and regular rhythm.  Pulmonary/Chest: Effort normal. She has no wheezes. She has no rales.  Abdominal: Soft. She exhibits no distension. There is no abdominal tenderness.  Musculoskeletal:        General: No tenderness or edema.     Cervical back: Normal range of motion and neck supple.  Neurological: She is alert and oriented to person, place, and time.  Skin: Skin is warm and dry.  Psychiatric: She has a normal mood and affect. Her behavior is normal. Thought content normal.  Nursing note and vitals reviewed.  Assessment & Plan:  1: Chronic heart failure with reduced ejection fraction- - NYHA class I - euvolemic today - She is weighing daily and reminded to call for an overnight weight gain of >2 pounds or a weekly weight gain of >5 pounds. - weight down 2 pounds from her last visit here 3 months ago - Unable to titrate entresto up this visit due to her blood pressure. Will attempt at future visits if able.  - on max dose carvedilol 25mg  BID - last saw cardiologist 09/04/2018) 10/03/15; says that she hasn't returned to him because she can't afford the $100 co-pay as  she's trying to pay off her hospital bills - BMP on 10/27/2018 reviewed and showed sodium 138, potassium 4.3, creatinine 0.6 and GFR >60 - BNP 09/01/2018 was 1590.0 - reports receiving her flu vaccine for this season  2: Diabetes- - glucose at home this morning was 122 - A1c was 11.2% on 10/27/2018 - saw PCP 09/03/2018) 04/22/2018  - saw endocrinology Dareen Piano) 11/03/2018.   Patient did not bring her medications nor a list. Each medication was verbally reviewed with the patient and she was encouraged to bring the bottles to every visit to confirm accuracy of list.  Return in 6 months or sooner for any questions/problems before then.

## 2019-03-16 ENCOUNTER — Ambulatory Visit: Payer: BLUE CROSS/BLUE SHIELD | Attending: Family | Admitting: Family

## 2019-03-16 ENCOUNTER — Other Ambulatory Visit: Payer: Self-pay

## 2019-03-16 ENCOUNTER — Encounter: Payer: Self-pay | Admitting: Family

## 2019-03-16 VITALS — BP 105/76 | HR 86 | Resp 18 | Ht 61.0 in | Wt 146.2 lb

## 2019-03-16 DIAGNOSIS — I5022 Chronic systolic (congestive) heart failure: Secondary | ICD-10-CM | POA: Insufficient documentation

## 2019-03-16 DIAGNOSIS — Z87891 Personal history of nicotine dependence: Secondary | ICD-10-CM | POA: Diagnosis not present

## 2019-03-16 DIAGNOSIS — Z7982 Long term (current) use of aspirin: Secondary | ICD-10-CM | POA: Diagnosis not present

## 2019-03-16 DIAGNOSIS — Z79899 Other long term (current) drug therapy: Secondary | ICD-10-CM | POA: Insufficient documentation

## 2019-03-16 DIAGNOSIS — Z794 Long term (current) use of insulin: Secondary | ICD-10-CM | POA: Insufficient documentation

## 2019-03-16 DIAGNOSIS — E119 Type 2 diabetes mellitus without complications: Secondary | ICD-10-CM | POA: Diagnosis not present

## 2019-03-16 MED ORDER — ENTRESTO 24-26 MG PO TABS: 1.0000 | ORAL_TABLET | Freq: Two times a day (BID) | ORAL | 3 refills | Status: DC

## 2019-03-16 MED ORDER — JARDIANCE 25 MG PO TABS: 25.0000 mg | ORAL_TABLET | Freq: Every day | ORAL | 3 refills | Status: DC

## 2019-03-16 NOTE — Patient Instructions (Signed)
Continue weighing daily and call for an overnight weight gain of > 2 pounds or a weekly weight gain of >5 pounds. 

## 2019-06-25 ENCOUNTER — Other Ambulatory Visit: Payer: Self-pay

## 2019-06-25 ENCOUNTER — Ambulatory Visit: Payer: BLUE CROSS/BLUE SHIELD | Attending: Internal Medicine

## 2019-06-25 DIAGNOSIS — Z23 Encounter for immunization: Secondary | ICD-10-CM

## 2019-06-25 NOTE — Progress Notes (Signed)
   Covid-19 Vaccination Clinic  Name:  Taylor [name]    MRN: [id number] DOB: 08-04-1965  06/25/2019  [name] was observed post Covid-19 immunization for 15 minutes without incident. She was provided with Vaccine Information Sheet and instruction to access the V-Safe system.   [name] was instructed to call 911 with any severe reactions post vaccine: Marland Kitchen Difficulty breathing  . Swelling of face and throat  . A fast heartbeat  . A bad rash all over body  . Dizziness and weakness   Immunizations Administered    Name Date Dose VIS Date Route   Pfizer COVID-19 Vaccine 06/25/2019  8:07 AM 0.3 mL 04/13/2018 Intramuscular   Manufacturer: [employer], [employer]   Lot: [id number]   NDC: [address]-6

## 2019-07-19 ENCOUNTER — Ambulatory Visit: Payer: Self-pay | Attending: Internal Medicine

## 2019-07-19 DIAGNOSIS — Z23 Encounter for immunization: Secondary | ICD-10-CM

## 2019-07-19 NOTE — Progress Notes (Signed)
   Covid-19 Vaccination Clinic  Name:  Taylor [name]    MRN: [id number] DOB: December 23, 1965  07/19/2019  [name] was observed post Covid-19 immunization for 15 minutes without incident. She was provided with Vaccine Information Sheet and instruction to access the V-Safe system.   Ms. Kidney was instructed to call 911 with any severe reactions post vaccine: Marland Kitchen Difficulty breathing  . Swelling of face and throat  . A fast heartbeat  . A bad rash all over body  . Dizziness and weakness   Immunizations Administered    Name Date Dose VIS Date Route   Pfizer COVID-19 Vaccine 07/19/2019  8:00 AM 0.3 mL 04/13/2018 Intramuscular   Manufacturer: [employer], [employer]   Lot: [id number]   NDC: [address]-5

## 2019-09-13 NOTE — Progress Notes (Signed)
Patient ID: Taylor [name], female    DOB: [birth year], 54 y.o.   MRN: [id number]  HPI Taylor [name] is a 54 y/o female with a history of diabetes, obstructive sleep apnea, remote tobacco use and chronic heart failure.  Echo report from 09/01/2018 reviewed and showed an EF of 20-25%. Echo report from 07/08/17 reviewed and showed and EF of 45-50% along with mild MR. Last echo was done 08/31/15 and showed an EF of 20% without valvular regurgitation.   Has not been admitted or been in the ED in the last 6 months.   She presents today with a chief complaint of a follow-up visit. She currently voices no complaints and specifically denies any difficulty sleeping, dizziness, abdominal distention, palpitations, pedal edema, chest pain, shortness of breath, cough, fatigue or weight gain.   Says that she's having "a lot" of trouble with her glucose running high. Recent A1c is quite high and she is supposed to be picking up trulicity and starting that. Takes her furosemide as needed and says that she takes it ~ twice / week.   Past Medical History:  Diagnosis Date  . CHF (congestive heart failure) ([facility])   . Diabetes mellitus without complication ([facility])   . Obstructive sleep apnea    Past Surgical History:  Procedure Laterality Date  . ABDOMINAL HYSTERECTOMY     Family History  Problem Relation Age of Onset  . Anemia Neg Hx   . Arrhythmia Neg Hx   . Asthma Neg Hx   . Clotting disorder Neg Hx   . Fainting Neg Hx   . Heart attack Neg Hx   . Heart disease Neg Hx   . Heart failure Neg Hx   . Hyperlipidemia Neg Hx   . Hypertension Neg Hx    Social History   Tobacco Use  . Smoking status: Former Games developer  . Smokeless tobacco: Never Used  Substance Use Topics  . Alcohol use: No    Comment: occ   No Known Allergies  Prior to Admission medications   Medication Sig Start Date End Date Taking? Authorizing Provider  aspirin EC 81 MG EC tablet Take 1 tablet (81 mg total) by mouth daily. 09/04/18  Yes  Gouru, Deanna Artis, MD  atorvastatin (LIPITOR) 40 MG tablet Take 1 tablet (40 mg total) by mouth daily. 03/10/18  Yes Clarisa Kindred A, FNP  carvedilol (COREG) 25 MG tablet Take 1 tablet (25 mg total) by mouth 2 (two) times daily. 10/06/18 09/14/19 Yes Easter Schinke A, FNP  empagliflozin (JARDIANCE) 25 MG TABS tablet Take 25 mg by mouth daily. 03/16/19  Yes Clarisa Kindred A, FNP  furosemide (LASIX) 20 MG tablet Take 1 tablet (20 mg total) by mouth daily. Patient taking differently: Take 20 mg by mouth as needed. Weight gain 2 pounds overnight, 5 pounds in a week, shortness of breath, or leg swelling 10/06/18  Yes Promyse Ardito A, FNP  glimepiride (AMARYL) 2 MG tablet Take 2 tablets by mouth 2 (two) times daily. 02/15/15  Yes [provider]  insulin detemir (LEVEMIR) 100 unit/ml SOLN Inject 30 Units into the skin at bedtime.    Yes [provider]  metFORMIN (GLUCOPHAGE) 1000 MG tablet Take 1 tablet (1,000 mg total) 2 (two) times daily with a meal by mouth. 12/31/16  Yes Dejohn Ibarra A, FNP  potassium chloride SA (K-DUR) 20 MEQ tablet Take 1 tablet (20 mEq total) by mouth daily. Take 2 tablets daily for today and tomorrow only and then take 1 tablet  daily Patient taking differently: Take 20 mEq by mouth as needed. Take only if you take your lasix (fluid) pill. 10/06/18  Yes Konstantinos Cordoba A, FNP  sacubitril-valsartan (ENTRESTO) 24-26 MG Take 1 tablet by mouth 2 (two) times daily. 03/16/19  Yes Delma Freeze, FNP     Review of Systems  Constitutional: Negative for appetite change and fatigue.  HENT: Negative for congestion, postnasal drip and sore throat.   Eyes: Negative.   Respiratory: Negative for cough, chest tightness, shortness of breath and wheezing.   Cardiovascular: Negative for chest pain, palpitations and leg swelling.  Gastrointestinal: Negative for abdominal distention and abdominal pain.  Endocrine: Negative.   Genitourinary: Negative.   Musculoskeletal: Negative for back  pain and neck pain.  Skin: Negative.   Allergic/Immunologic: Negative.   Neurological: Negative for dizziness and light-headedness.  Hematological: Negative for adenopathy. Does not bruise/bleed easily.  Psychiatric/Behavioral: Negative for dysphoric mood and sleep disturbance (sleeping on 2 pillows). The patient is not nervous/anxious.    Vitals:   09/14/19 0907  BP: (!) 144/92  Pulse: 84  Resp: 20  SpO2: 99%  Weight: 149 lb (67.6 kg)  Height: 5\' 1"  (1.549 m)   Wt Readings from Last 3 Encounters:  09/14/19 149 lb (67.6 kg)  03/16/19 146 lb 3.2 oz (66.3 kg)  12/08/18 148 lb 3.2 oz (67.2 kg)   Lab Results  Component Value Date   CREATININE 0.49 10/13/2018   CREATININE 0.49 10/06/2018   CREATININE 0.55 09/02/2018    Physical Exam Vitals and nursing note reviewed.  Constitutional:      Appearance: She is well-developed.  HENT:     Head: Normocephalic and atraumatic.  Neck:     Vascular: No JVD.  Cardiovascular:     Rate and Rhythm: Normal rate and regular rhythm.  Pulmonary:     Effort: Pulmonary effort is normal.     Breath sounds: No wheezing or rales.  Abdominal:     General: There is no distension.     Palpations: Abdomen is soft.     Tenderness: There is no abdominal tenderness.  Musculoskeletal:        General: No tenderness.     Cervical back: Normal range of motion and neck supple.  Skin:    General: Skin is warm and dry.  Neurological:     Mental Status: She is alert and oriented to person, place, and time.  Psychiatric:        Behavior: Behavior normal.        Thought Content: Thought content normal.    Assessment & Plan:  1: Chronic heart failure with reduced ejection fraction- - NYHA class I - euvolemic today - She is weighing daily and reminded to call for an overnight weight gain of >2 pounds or a weekly weight gain of >5 pounds. - weight up 3 pounds from her last visit here 6 months ago - Increase entresto to 49/51mg  BID; she can finish her  current bottle by taking 2 tablets BID until gone and then resume 1 tablet BID when she gets the new prescription picked up - check BMP/ Mg at her next visit  - on max dose carvedilol 25mg  BID - last saw cardiologist 09/04/2018) 10/03/15; says that she hasn't returned to him because she can't afford the $100 co-pay as she's trying to pay off her hospital bills - BMP on 05/30/19 reviewed and showed sodium 140, potassium 3.4, creatinine 0.6 and GFR 126 - BNP 09/01/2018 was 1590.0 - reports  receiving both COVID vaccines  2: Diabetes- - glucose at home this morning was 242 - A1c was 13.6% on 09/08/19 - saw PCP Dareen Piano) 08/01/19  - saw endocrinology Rosaura Carpenter) 09/08/19 & is going to be starting trulicity    Patient did not bring her medications nor a list. Each medication was verbally reviewed with the patient and she was encouraged to bring the bottles to every visit to confirm accuracy of list.  Return in 1 month or sooner for any questions/problems before then.

## 2019-09-14 ENCOUNTER — Encounter: Payer: Self-pay | Admitting: Family

## 2019-09-14 ENCOUNTER — Other Ambulatory Visit: Payer: Self-pay

## 2019-09-14 ENCOUNTER — Other Ambulatory Visit: Payer: Self-pay | Admitting: Family

## 2019-09-14 ENCOUNTER — Ambulatory Visit: Payer: 59 | Attending: Family | Admitting: Family

## 2019-09-14 VITALS — BP 144/92 | HR 84 | Resp 20 | Ht 61.0 in | Wt 149.0 lb

## 2019-09-14 DIAGNOSIS — E119 Type 2 diabetes mellitus without complications: Secondary | ICD-10-CM | POA: Diagnosis not present

## 2019-09-14 DIAGNOSIS — I5022 Chronic systolic (congestive) heart failure: Secondary | ICD-10-CM | POA: Insufficient documentation

## 2019-09-14 DIAGNOSIS — Z87891 Personal history of nicotine dependence: Secondary | ICD-10-CM | POA: Diagnosis not present

## 2019-09-14 DIAGNOSIS — Z794 Long term (current) use of insulin: Secondary | ICD-10-CM | POA: Diagnosis not present

## 2019-09-14 DIAGNOSIS — Z7982 Long term (current) use of aspirin: Secondary | ICD-10-CM | POA: Insufficient documentation

## 2019-09-14 DIAGNOSIS — Z79899 Other long term (current) drug therapy: Secondary | ICD-10-CM | POA: Insufficient documentation

## 2019-09-14 DIAGNOSIS — G4733 Obstructive sleep apnea (adult) (pediatric): Secondary | ICD-10-CM | POA: Diagnosis not present

## 2019-09-14 MED ORDER — SACUBITRIL-VALSARTAN 49-51 MG PO TABS: 1.0000 | ORAL_TABLET | Freq: Two times a day (BID) | ORAL | 5 refills | Status: DC

## 2019-09-14 NOTE — Patient Instructions (Addendum)
Continue weighing daily and call for an overnight weight gain of > 2 pounds or a weekly weight gain of >5 pounds.   Finish your current entresto by taking 2 tablets in the morning and 2 tablets in the evening. When you pick up the new entresto dose of 49/51mg , you will then resume taking 1 tablet in the morning and 1 tablet in the evening.

## 2019-10-15 NOTE — Progress Notes (Signed)
Patient ID: Taylor [name], female    DOB: 1966/02/11, 54 y.o.   MRN: [id number]  HPI Taylor [name] is a 54 y/o female with a history of diabetes, obstructive sleep apnea, remote tobacco use and chronic heart failure.  Echo report from 09/01/2018 reviewed and showed an EF of 20-25%. Echo report from 07/08/17 reviewed and showed and EF of 45-50% along with mild MR. Echo done 08/31/15 and showed an EF of 20% without valvular regurgitation.   Has not been admitted or been in the ED in the last 6 months.   Taylor [name] presents today with a chief complaint of a follow-up visit. Taylor [name] currently voices no complaints and specifically denies any difficulty sleeping, dizziness, abdominal distention, palpitations, pedal edema, shortness of breath, cough, fatigue or weight gain.   Tolerated entresto titration at last visit without known side effects.   Past Medical History:  Diagnosis Date  . CHF (congestive heart failure) (HCC)   . Diabetes mellitus without complication (HCC)   . Obstructive sleep apnea    Past Surgical History:  Procedure Laterality Date  . ABDOMINAL HYSTERECTOMY     Family History  Problem Relation Age of Onset  . Anemia Neg Hx   . Arrhythmia Neg Hx   . Asthma Neg Hx   . Clotting disorder Neg Hx   . Fainting Neg Hx   . Heart attack Neg Hx   . Heart disease Neg Hx   . Heart failure Neg Hx   . Hyperlipidemia Neg Hx   . Hypertension Neg Hx    Social History   Tobacco Use  . Smoking status: Former Games developer  . Smokeless tobacco: Never Used  Substance Use Topics  . Alcohol use: No    Comment: occ   No Known Allergies  Prior to Admission medications   Medication Sig Start Date End Date Taking? Authorizing Provider  aspirin EC 81 MG EC tablet Take 1 tablet (81 mg total) by mouth daily. 09/04/18  Yes Gouru, Deanna Artis, MD  atorvastatin (LIPITOR) 40 MG tablet Take 1 tablet (40 mg total) by mouth daily. 03/10/18  Yes Clarisa Kindred A, FNP  carvedilol (COREG) 25 MG tablet TAKE 1 TABLET BY MOUTH  TWICE A DAY 09/14/19  Yes Lavanda Nevels A, FNP  empagliflozin (JARDIANCE) 25 MG TABS tablet Take 25 mg by mouth daily. 03/16/19  Yes Clarisa Kindred A, FNP  furosemide (LASIX) 20 MG tablet Take 1 tablet (20 mg total) by mouth daily. Patient taking differently: Take 20 mg by mouth as needed. Weight gain 2 pounds overnight, 5 pounds in a week, shortness of breath, or leg swelling 10/06/18  Yes Keishla Oyer A, FNP  glimepiride (AMARYL) 2 MG tablet Take 2 tablets by mouth 2 (two) times daily. 02/15/15  Yes [provider]  insulin detemir (LEVEMIR) 100 unit/ml SOLN Inject 17 Units into the skin 2 (two) times daily.    Yes [provider]  metFORMIN (GLUCOPHAGE) 1000 MG tablet Take 1 tablet (1,000 mg total) 2 (two) times daily with a meal by mouth. 12/31/16  Yes Nana Vastine A, FNP  potassium chloride SA (K-DUR) 20 MEQ tablet Take 1 tablet (20 mEq total) by mouth daily. Take 2 tablets daily for today and tomorrow only and then take 1 tablet daily Patient taking differently: Take 20 mEq by mouth as needed. Take only if you take your lasix (fluid) pill. 10/06/18  Yes Aiza Vollrath A, FNP  sacubitril-valsartan (ENTRESTO) 49-51 MG Take 1 tablet by mouth 2 (two) times daily.  09/14/19  Yes Delma Freeze, FNP    Review of Systems  Constitutional: Negative for appetite change and fatigue.  HENT: Negative for congestion, postnasal drip and sore throat.   Eyes: Negative.   Respiratory: Negative for cough, chest tightness, shortness of breath and wheezing.   Cardiovascular: Negative for chest pain, palpitations and leg swelling.  Gastrointestinal: Negative for abdominal distention and abdominal pain.  Endocrine: Negative.   Genitourinary: Negative.   Musculoskeletal: Negative for back pain and neck pain.  Skin: Negative.   Allergic/Immunologic: Negative.   Neurological: Negative for dizziness and light-headedness.  Hematological: Negative for adenopathy. Does not bruise/bleed easily.   Psychiatric/Behavioral: Negative for dysphoric mood and sleep disturbance (sleeping on 2 pillows). The patient is not nervous/anxious.    Vitals:   10/17/19 0936  BP: 126/80  Pulse: 81  Resp: 20  SpO2: 100%  Weight: 150 lb (68 kg)  Height: 5\' 1"  (1.549 m)   Wt Readings from Last 3 Encounters:  10/17/19 150 lb (68 kg)  09/14/19 149 lb (67.6 kg)  03/16/19 146 lb 3.2 oz (66.3 kg)   Lab Results  Component Value Date   CREATININE 0.49 10/13/2018   CREATININE 0.49 10/06/2018   CREATININE 0.55 09/02/2018     Physical Exam Vitals and nursing note reviewed.  Constitutional:      Appearance: Taylor [name] is well-developed.  HENT:     Head: Normocephalic and atraumatic.  Neck:     Vascular: No JVD.  Cardiovascular:     Rate and Rhythm: Normal rate and regular rhythm.  Pulmonary:     Effort: Pulmonary effort is normal.     Breath sounds: No wheezing or rales.  Abdominal:     General: There is no distension.     Palpations: Abdomen is soft.     Tenderness: There is no abdominal tenderness.  Musculoskeletal:        General: No tenderness.     Cervical back: Normal range of motion and neck supple.  Skin:    General: Skin is warm and dry.  Neurological:     Mental Status: Taylor [name] is alert and oriented to person, place, and time.  Psychiatric:        Behavior: Behavior normal.        Thought Content: Thought content normal.    Assessment & Plan:  1: Chronic heart failure with reduced ejection fraction- - NYHA class I - euvolemic today - Taylor [name] is weighing daily and reminded to call for an overnight weight gain of >2 pounds or a weekly weight gain of >5 pounds. - weight stable from her last visit here 1 month ago - entresto increased to 49/51mg  at her last visit; discussed titrating up at her next visit - check BMP/ Mg today due to entresto titration - on max dose carvedilol 25mg  BID - last saw cardiologist 09/04/2018) 10/03/15; says that Taylor [name] hasn't returned to him because Taylor [name] can't  afford the $100 co-pay as Taylor [name]'s trying to pay off her hospital bills - BMP on 05/30/19 reviewed and showed sodium 140, potassium 3.4, creatinine 0.6 and GFR 126 - BNP 09/01/2018 was 1590.0 - reports receiving both COVID vaccines  2: Diabetes- - glucose at home has been running between 80-120's since beginning trulicity - A1c was 13.6% on 09/08/19 - saw PCP 09/03/2018) 08/01/19  - saw endocrinology Dareen Piano) 09/08/19     Patient did not bring her medications nor a list. Each medication was verbally reviewed with the patient and Taylor [name] was encouraged to bring  the bottles to every visit to confirm accuracy of list.  Return in 3 months or sooner for any questions/problems before then.

## 2019-10-17 ENCOUNTER — Ambulatory Visit: Payer: 59 | Attending: Family | Admitting: Family

## 2019-10-17 ENCOUNTER — Encounter: Payer: Self-pay | Admitting: Family

## 2019-10-17 ENCOUNTER — Other Ambulatory Visit: Payer: Self-pay

## 2019-10-17 ENCOUNTER — Telehealth: Payer: Self-pay | Admitting: Family

## 2019-10-17 VITALS — BP 126/80 | HR 81 | Resp 20 | Ht 61.0 in | Wt 150.0 lb

## 2019-10-17 DIAGNOSIS — Z87891 Personal history of nicotine dependence: Secondary | ICD-10-CM | POA: Insufficient documentation

## 2019-10-17 DIAGNOSIS — G4733 Obstructive sleep apnea (adult) (pediatric): Secondary | ICD-10-CM | POA: Diagnosis not present

## 2019-10-17 DIAGNOSIS — Z794 Long term (current) use of insulin: Secondary | ICD-10-CM | POA: Diagnosis not present

## 2019-10-17 DIAGNOSIS — E119 Type 2 diabetes mellitus without complications: Secondary | ICD-10-CM | POA: Diagnosis not present

## 2019-10-17 DIAGNOSIS — Z7982 Long term (current) use of aspirin: Secondary | ICD-10-CM | POA: Insufficient documentation

## 2019-10-17 DIAGNOSIS — I5022 Chronic systolic (congestive) heart failure: Secondary | ICD-10-CM | POA: Diagnosis not present

## 2019-10-17 DIAGNOSIS — Z8249 Family history of ischemic heart disease and other diseases of the circulatory system: Secondary | ICD-10-CM | POA: Insufficient documentation

## 2019-10-17 DIAGNOSIS — Z79899 Other long term (current) drug therapy: Secondary | ICD-10-CM | POA: Diagnosis not present

## 2019-10-17 LAB — BASIC METABOLIC PANEL
Anion gap: 8 (ref 5–15)
BUN: 11 mg/dL (ref 6–20)
CO2: 28 mmol/L (ref 22–32)
Calcium: 8.5 mg/dL — ABNORMAL LOW (ref 8.9–10.3)
Chloride: 104 mmol/L (ref 98–111)
Creatinine, Ser: 0.43 mg/dL — ABNORMAL LOW (ref 0.44–1.00)
GFR calc Af Amer: >60 mL/min (ref >60)
GFR calc non Af Amer: >60 mL/min (ref >60)
Glucose, Bld: 114 mg/dL — ABNORMAL HIGH (ref 70–99)
Potassium: 3.4 mmol/L — ABNORMAL LOW (ref 3.5–5.1)
Sodium: 140 mmol/L (ref 135–145)

## 2019-10-17 LAB — MAGNESIUM: Magnesium: 1.7 mg/dL (ref 1.7–2.4)

## 2019-10-17 NOTE — Telephone Encounter (Signed)
Spoke with patient regarding lab results obtained earlier today. Renal function looks good although potassium is slightly low. She's currently taking potassium PRN if she takes furosemide which is rare. She is taking entresto 49/51mg  BID now.   Advised patient to take potassium once/ week and additional dose if she takes furosemide. Patient verbalized understanding.

## 2019-10-17 NOTE — Patient Instructions (Signed)
Continue weighing daily and call for an overnight weight gain of > 2 pounds or a weekly weight gain of >5 pounds. 

## 2019-11-22 ENCOUNTER — Other Ambulatory Visit: Payer: Self-pay | Admitting: Internal Medicine

## 2019-11-22 DIAGNOSIS — Z1231 Encounter for screening mammogram for malignant neoplasm of breast: Secondary | ICD-10-CM

## 2019-12-16 ENCOUNTER — Other Ambulatory Visit: Payer: Self-pay

## 2019-12-16 ENCOUNTER — Ambulatory Visit
Admission: RE | Admit: 2019-12-16 | Discharge: 2019-12-16 | Disposition: A | Discharge status: Home / Self Care | Payer: 59 | Source: Ambulatory Visit | Attending: Internal Medicine | Admitting: Internal Medicine

## 2019-12-16 DIAGNOSIS — Z1231 Encounter for screening mammogram for malignant neoplasm of breast: Secondary | ICD-10-CM | POA: Insufficient documentation

## 2020-01-16 ENCOUNTER — Encounter: Payer: Self-pay | Admitting: Family

## 2020-01-16 ENCOUNTER — Other Ambulatory Visit: Payer: Self-pay

## 2020-01-16 ENCOUNTER — Ambulatory Visit: Payer: 59 | Attending: Family | Admitting: Family

## 2020-01-16 VITALS — BP 127/95 | HR 100 | Resp 18 | Ht 61.0 in | Wt 160.0 lb

## 2020-01-16 DIAGNOSIS — Z8249 Family history of ischemic heart disease and other diseases of the circulatory system: Secondary | ICD-10-CM | POA: Diagnosis not present

## 2020-01-16 DIAGNOSIS — Z87891 Personal history of nicotine dependence: Secondary | ICD-10-CM | POA: Insufficient documentation

## 2020-01-16 DIAGNOSIS — Z79899 Other long term (current) drug therapy: Secondary | ICD-10-CM | POA: Diagnosis not present

## 2020-01-16 DIAGNOSIS — Z7982 Long term (current) use of aspirin: Secondary | ICD-10-CM | POA: Diagnosis not present

## 2020-01-16 DIAGNOSIS — I5022 Chronic systolic (congestive) heart failure: Secondary | ICD-10-CM | POA: Diagnosis present

## 2020-01-16 DIAGNOSIS — E119 Type 2 diabetes mellitus without complications: Secondary | ICD-10-CM | POA: Insufficient documentation

## 2020-01-16 DIAGNOSIS — Z794 Long term (current) use of insulin: Secondary | ICD-10-CM | POA: Diagnosis not present

## 2020-01-16 MED ORDER — EMPAGLIFLOZIN 25 MG PO TABS: 25.0000 mg | ORAL_TABLET | Freq: Every day | ORAL | 3 refills | Status: DC

## 2020-01-16 MED ORDER — FUROSEMIDE 20 MG PO TABS: 20.0000 mg | ORAL_TABLET | ORAL | 3 refills | Status: DC | PRN

## 2020-01-16 NOTE — Patient Instructions (Addendum)
Continue weighing daily and call for an overnight weight gain of > 2 pounds or a weekly weight gain of >5 pounds.   Call us in there future if you'd like to schedule another appointment 

## 2020-01-16 NOTE — Progress Notes (Signed)
Patient ID: Taylor [name], female    DOB: [birth year], 54 y.o.   MRN: [id number]  HPI   Taylor [name] is a 54 y/o female with a history of diabetes, obstructive sleep apnea, remote tobacco use and chronic heart failure.  Echo report from 09/01/2018 reviewed and showed an EF of 20-25%. Echo report from 07/08/17 reviewed and showed and EF of 45-50% along with mild MR. Echo done 08/31/15 and showed an EF of 20% without valvular regurgitation.   Has not been admitted or been in the ED in the last 6 months.   She presents today with a chief complaint of a follow-up visit. She currently voices no complaints and specifically denies any difficulty sleeping, dizziness, abdominal distention, palpitations, pedal edema, chest pain, shortness of breath, cough, fatigue or weight gain.   Past Medical History:  Diagnosis Date  . CHF (congestive heart failure) (HCC)   . Diabetes mellitus without complication (HCC)   . Obstructive sleep apnea    Past Surgical History:  Procedure Laterality Date  . ABDOMINAL HYSTERECTOMY     Family History  Problem Relation Age of Onset  . Anemia Neg Hx   . Arrhythmia Neg Hx   . Asthma Neg Hx   . Clotting disorder Neg Hx   . Fainting Neg Hx   . Heart attack Neg Hx   . Heart disease Neg Hx   . Heart failure Neg Hx   . Hyperlipidemia Neg Hx   . Hypertension Neg Hx    Social History   Tobacco Use  . Smoking status: Former Games developer  . Smokeless tobacco: Never Used  Substance Use Topics  . Alcohol use: No    Comment: occ   No Known Allergies  Prior to Admission medications   Medication Sig Start Date End Date Taking? Authorizing Provider  aspirin EC 81 MG EC tablet Take 1 tablet (81 mg total) by mouth daily. 09/04/18  Yes Gouru, Deanna Artis, MD  atorvastatin (LIPITOR) 40 MG tablet Take 1 tablet (40 mg total) by mouth daily. 03/10/18  Yes Clarisa Kindred A, FNP  carvedilol (COREG) 25 MG tablet TAKE 1 TABLET BY MOUTH TWICE A DAY 09/14/19  Yes Clarisa Kindred A, FNP  empagliflozin  (JARDIANCE) 25 MG TABS tablet Take 1 tablet (25 mg total) by mouth daily. 01/16/20  Yes Shalayne Leach, Inetta Fermo A, FNP  furosemide (LASIX) 20 MG tablet Take 1 tablet (20 mg total) by mouth as needed. Weight gain 2 pounds overnight, 5 pounds in a week, shortness of breath, or leg swelling 01/16/20  Yes Brinton Brandel A, FNP  glimepiride (AMARYL) 2 MG tablet Take 2 tablets by mouth 2 (two) times daily. 02/15/15  Yes [provider]  insulin detemir (LEVEMIR) 100 unit/ml SOLN Inject 17 Units into the skin 2 (two) times daily.    Yes [provider]  metFORMIN (GLUCOPHAGE) 1000 MG tablet Take 1 tablet (1,000 mg total) 2 (two) times daily with a meal by mouth. 12/31/16  Yes Charletta Voight A, FNP  potassium chloride SA (K-DUR) 20 MEQ tablet Take 1 tablet (20 mEq total) by mouth daily. Take 2 tablets daily for today and tomorrow only and then take 1 tablet daily Patient taking differently: Take 20 mEq by mouth once a week.  10/06/18  Yes Marysa Wessner A, FNP  sacubitril-valsartan (ENTRESTO) 49-51 MG Take 1 tablet by mouth 2 (two) times daily. 09/14/19  Yes Delma Freeze, FNP     Review of Systems  Constitutional: Negative for appetite change and  fatigue.  HENT: Negative for congestion, postnasal drip and sore throat.   Eyes: Negative.   Respiratory: Negative for cough, chest tightness, shortness of breath and wheezing.   Cardiovascular: Negative for chest pain, palpitations and leg swelling.  Gastrointestinal: Negative for abdominal distention and abdominal pain.  Endocrine: Negative.   Genitourinary: Negative.   Musculoskeletal: Negative for back pain and neck pain.  Skin: Negative.   Allergic/Immunologic: Negative.   Neurological: Negative for dizziness and light-headedness.  Hematological: Negative for adenopathy. Does not bruise/bleed easily.  Psychiatric/Behavioral: Negative for dysphoric mood and sleep disturbance (sleeping on 2 pillows). The patient is not nervous/anxious.      Vitals:   01/16/20 1345  BP: (!) 127/95  Pulse: 100  Resp: 18  SpO2: 92%  Weight: 160 lb (72.6 kg)  Height: 5\' 1"  (1.549 m)   Wt Readings from Last 3 Encounters:  01/16/20 160 lb (72.6 kg)  10/17/19 150 lb (68 kg)  09/14/19 149 lb (67.6 kg)   Lab Results  Component Value Date   CREATININE 0.43 (L) 10/17/2019   CREATININE 0.49 10/13/2018   CREATININE 0.49 10/06/2018     Physical Exam Vitals and nursing note reviewed.  Constitutional:      Appearance: She is well-developed.  HENT:     Head: Normocephalic and atraumatic.  Neck:     Vascular: No JVD.  Cardiovascular:     Rate and Rhythm: Normal rate and regular rhythm.  Pulmonary:     Effort: Pulmonary effort is normal.     Breath sounds: No wheezing or rales.  Abdominal:     General: There is no distension.     Palpations: Abdomen is soft.     Tenderness: There is no abdominal tenderness.  Musculoskeletal:        General: No tenderness.     Cervical back: Normal range of motion and neck supple.  Skin:    General: Skin is warm and dry.  Neurological:     Mental Status: She is alert and oriented to person, place, and time.  Psychiatric:        Behavior: Behavior normal.        Thought Content: Thought content normal.    Assessment & Plan:  1: Chronic heart failure with reduced ejection fraction- - NYHA class I - euvolemic today - She is weighing daily and reminded to call for an overnight weight gain of >2 pounds or a weekly weight gain of >5 pounds. - weight up 10 pounds from her last visit here 3 months ago - on max dose carvedilol 25mg  BID - last saw cardiologist 10/08/2018) 10/03/15; says that she hasn't returned to him because she can't afford the $100 co-pay as she's trying to pay off her hospital bills - BMP on 12/01/19 reviewed and showed sodium 139, potassium 3.5, creatinine 0.5 and GFR 155 - BNP 09/01/2018 was 1590.0 - reports receiving both COVID vaccines - received her flu vaccine for this  season  2: Diabetes- - glucose at home this morning was 130 - A1c was 9.2% (improved from 13%) on 12/01/19 - saw PCP 09/03/2018) 12/01/19  - saw endocrinology Dareen Piano) 09/08/19     Patient did not bring her medications nor a list. Each medication was verbally reviewed with the patient and she was encouraged to bring the bottles to every visit to confirm accuracy of list.  Due to HF stability, will not make a return appointment for patient at this time. Advised patient that she could call back at anytime  to make another appointment and she was comfortable with this plan. Did advise her that should she not return, her medication refill requests would need to go to her PCP.

## 2020-10-03 ENCOUNTER — Other Ambulatory Visit: Payer: Self-pay | Admitting: Family

## 2020-10-21 ENCOUNTER — Inpatient Hospital Stay: Payer: 59

## 2020-10-21 ENCOUNTER — Inpatient Hospital Stay
Admission: EM | Admit: 2020-10-21 | Discharge: 2020-10-24 | DRG: 291 | Disposition: A | Discharge status: Home / Self Care | Payer: 59 | Attending: Internal Medicine | Admitting: Internal Medicine

## 2020-10-21 ENCOUNTER — Other Ambulatory Visit: Payer: Self-pay

## 2020-10-21 ENCOUNTER — Encounter: Payer: Self-pay | Admitting: Internal Medicine

## 2020-10-21 ENCOUNTER — Emergency Department: Payer: 59

## 2020-10-21 ENCOUNTER — Inpatient Hospital Stay
Admit: 2020-10-21 | Discharge: 2020-10-21 | Disposition: A | Discharge status: Home / Self Care | Payer: 59 | Attending: Internal Medicine | Admitting: Internal Medicine

## 2020-10-21 DIAGNOSIS — E1122 Type 2 diabetes mellitus with diabetic chronic kidney disease: Secondary | ICD-10-CM | POA: Diagnosis present

## 2020-10-21 DIAGNOSIS — G459 Transient cerebral ischemic attack, unspecified: Secondary | ICD-10-CM | POA: Diagnosis present

## 2020-10-21 DIAGNOSIS — I081 Rheumatic disorders of both mitral and tricuspid valves: Secondary | ICD-10-CM | POA: Diagnosis present

## 2020-10-21 DIAGNOSIS — Z794 Long term (current) use of insulin: Secondary | ICD-10-CM

## 2020-10-21 DIAGNOSIS — I130 Hypertensive heart and chronic kidney disease with heart failure and stage 1 through stage 4 chronic kidney disease, or unspecified chronic kidney disease: Secondary | ICD-10-CM | POA: Diagnosis not present

## 2020-10-21 DIAGNOSIS — Z8673 Personal history of transient ischemic attack (TIA), and cerebral infarction without residual deficits: Secondary | ICD-10-CM | POA: Diagnosis not present

## 2020-10-21 DIAGNOSIS — E1165 Type 2 diabetes mellitus with hyperglycemia: Secondary | ICD-10-CM | POA: Diagnosis present

## 2020-10-21 DIAGNOSIS — Z7982 Long term (current) use of aspirin: Secondary | ICD-10-CM | POA: Diagnosis not present

## 2020-10-21 DIAGNOSIS — E861 Hypovolemia: Secondary | ICD-10-CM | POA: Diagnosis not present

## 2020-10-21 DIAGNOSIS — Z7984 Long term (current) use of oral hypoglycemic drugs: Secondary | ICD-10-CM

## 2020-10-21 DIAGNOSIS — G4733 Obstructive sleep apnea (adult) (pediatric): Secondary | ICD-10-CM | POA: Diagnosis present

## 2020-10-21 DIAGNOSIS — E860 Dehydration: Secondary | ICD-10-CM | POA: Diagnosis present

## 2020-10-21 DIAGNOSIS — I5023 Acute on chronic systolic (congestive) heart failure: Secondary | ICD-10-CM | POA: Diagnosis present

## 2020-10-21 DIAGNOSIS — Z79899 Other long term (current) drug therapy: Secondary | ICD-10-CM

## 2020-10-21 DIAGNOSIS — Z87891 Personal history of nicotine dependence: Secondary | ICD-10-CM

## 2020-10-21 DIAGNOSIS — I420 Dilated cardiomyopathy: Secondary | ICD-10-CM | POA: Diagnosis present

## 2020-10-21 DIAGNOSIS — Z20822 Contact with and (suspected) exposure to covid-19: Secondary | ICD-10-CM | POA: Diagnosis present

## 2020-10-21 DIAGNOSIS — E785 Hyperlipidemia, unspecified: Secondary | ICD-10-CM | POA: Diagnosis present

## 2020-10-21 DIAGNOSIS — R42 Dizziness and giddiness: Secondary | ICD-10-CM

## 2020-10-21 DIAGNOSIS — I5022 Chronic systolic (congestive) heart failure: Secondary | ICD-10-CM

## 2020-10-21 DIAGNOSIS — E876 Hypokalemia: Secondary | ICD-10-CM | POA: Diagnosis present

## 2020-10-21 DIAGNOSIS — D7589 Other specified diseases of blood and blood-forming organs: Secondary | ICD-10-CM | POA: Diagnosis present

## 2020-10-21 DIAGNOSIS — N182 Chronic kidney disease, stage 2 (mild): Secondary | ICD-10-CM | POA: Diagnosis present

## 2020-10-21 DIAGNOSIS — T502X5A Adverse effect of carbonic-anhydrase inhibitors, benzothiadiazides and other diuretics, initial encounter: Secondary | ICD-10-CM | POA: Diagnosis present

## 2020-10-21 DIAGNOSIS — I959 Hypotension, unspecified: Secondary | ICD-10-CM | POA: Diagnosis present

## 2020-10-21 DIAGNOSIS — I509 Heart failure, unspecified: Secondary | ICD-10-CM

## 2020-10-21 DIAGNOSIS — I9589 Other hypotension: Secondary | ICD-10-CM | POA: Diagnosis not present

## 2020-10-21 DIAGNOSIS — I952 Hypotension due to drugs: Secondary | ICD-10-CM | POA: Diagnosis not present

## 2020-10-21 LAB — URINALYSIS, COMPLETE (UACMP) WITH MICROSCOPIC
Bacteria, UA: NONE SEEN
Glucose, UA: >1000 mg/dL — AB
Ketones, ur: 15 mg/dL — AB
Leukocytes,Ua: NEGATIVE
Nitrite: NEGATIVE
Protein, ur: >300 mg/dL — AB
Specific Gravity, Urine: 1.02 (ref 1.005–1.030)
WBC, UA: >50 WBC/hpf — ABNORMAL HIGH (ref 0–5)
pH: 5.5 (ref 5.0–8.0)

## 2020-10-21 LAB — CBC
HCT: 47.1 % — ABNORMAL HIGH (ref 36.0–46.0)
Hemoglobin: 15.3 g/dL — ABNORMAL HIGH (ref 12.0–15.0)
MCH: 33 pg (ref 26.0–34.0)
MCHC: 32.5 g/dL (ref 30.0–36.0)
MCV: 101.5 fL — ABNORMAL HIGH (ref 80.0–100.0)
Platelets: 283 10*3/uL (ref 150–400)
RBC: 4.64 MIL/uL (ref 3.87–5.11)
RDW: 12.1 % (ref 11.5–15.5)
WBC: 10.2 10*3/uL (ref 4.0–10.5)
nRBC: 0 % (ref 0.0–0.2)

## 2020-10-21 LAB — BASIC METABOLIC PANEL
Anion gap: 13 (ref 5–15)
BUN: 11 mg/dL (ref 6–20)
CO2: 28 mmol/L (ref 22–32)
Calcium: 8.9 mg/dL (ref 8.9–10.3)
Chloride: 96 mmol/L — ABNORMAL LOW (ref 98–111)
Creatinine, Ser: 0.57 mg/dL (ref 0.44–1.00)
GFR, Estimated: >60 mL/min (ref >60)
Glucose, Bld: 399 mg/dL — ABNORMAL HIGH (ref 70–99)
Potassium: 3.3 mmol/L — ABNORMAL LOW (ref 3.5–5.1)
Sodium: 137 mmol/L (ref 135–145)

## 2020-10-21 LAB — RESP PANEL BY RT-PCR (FLU A&B, COVID) ARPGX2
Influenza A by PCR: NEGATIVE
Influenza B by PCR: NEGATIVE
SARS Coronavirus 2 by RT PCR: NEGATIVE

## 2020-10-21 LAB — HEPATIC FUNCTION PANEL
ALT: 12 U/L (ref 0–44)
AST: 14 U/L — ABNORMAL LOW (ref 15–41)
Albumin: 2.8 g/dL — ABNORMAL LOW (ref 3.5–5.0)
Alkaline Phosphatase: 108 U/L (ref 38–126)
Bilirubin, Direct: 0.2 mg/dL (ref 0.0–0.2)
Indirect Bilirubin: 1.3 mg/dL — ABNORMAL HIGH (ref 0.3–0.9)
Total Bilirubin: 1.5 mg/dL — ABNORMAL HIGH (ref 0.3–1.2)
Total Protein: 6.1 g/dL — ABNORMAL LOW (ref 6.5–8.1)

## 2020-10-21 LAB — CBG MONITORING, ED
Glucose-Capillary: 303 mg/dL — ABNORMAL HIGH (ref 70–99)
Glucose-Capillary: 336 mg/dL — ABNORMAL HIGH (ref 70–99)

## 2020-10-21 LAB — TROPONIN I (HIGH SENSITIVITY)
Troponin I (High Sensitivity): 35 ng/L — ABNORMAL HIGH (ref <18)
Troponin I (High Sensitivity): 41 ng/L — ABNORMAL HIGH (ref <18)

## 2020-10-21 LAB — FOLATE: Folate: 13 ng/mL (ref >5.9)

## 2020-10-21 LAB — GLUCOSE, CAPILLARY: Glucose-Capillary: 217 mg/dL — ABNORMAL HIGH (ref 70–99)

## 2020-10-21 LAB — CK: Total CK: 34 U/L — ABNORMAL LOW (ref 38–234)

## 2020-10-21 LAB — TSH: TSH: 3.037 u[IU]/mL (ref 0.350–4.500)

## 2020-10-21 LAB — BRAIN NATRIURETIC PEPTIDE: B Natriuretic Peptide: 1597.6 pg/mL — ABNORMAL HIGH (ref 0.0–100.0)

## 2020-10-21 MED ORDER — LACTATED RINGERS IV BOLUS
1000.0000 mL | Freq: Once | INTRAVENOUS | Status: AC
  Administered 2020-10-21: 1000 mL via INTRAVENOUS

## 2020-10-21 MED ORDER — ATORVASTATIN CALCIUM 20 MG PO TABS
40.0000 mg | ORAL_TABLET | Freq: Every day | ORAL | Status: DC
  Administered 2020-10-21 – 2020-10-24 (×4): 40 mg via ORAL
  Filled 2020-10-21 (×4): qty 2

## 2020-10-21 MED ORDER — INSULIN ASPART 100 UNIT/ML IJ SOLN
0.0000 [IU] | Freq: Three times a day (TID) | INTRAMUSCULAR | Status: DC
  Administered 2020-10-21: 11 [IU] via SUBCUTANEOUS
  Administered 2020-10-22: 8 [IU] via SUBCUTANEOUS
  Administered 2020-10-23: 3 [IU] via SUBCUTANEOUS
  Administered 2020-10-24: 5 [IU] via SUBCUTANEOUS
  Administered 2020-10-24: 3 [IU] via SUBCUTANEOUS
  Filled 2020-10-21 (×5): qty 1

## 2020-10-21 MED ORDER — HEPARIN SODIUM (PORCINE) 5000 UNIT/ML IJ SOLN
5000.0000 [IU] | Freq: Three times a day (TID) | INTRAMUSCULAR | Status: DC
  Administered 2020-10-21 – 2020-10-22 (×3): 5000 [IU] via SUBCUTANEOUS
  Filled 2020-10-21 (×3): qty 1

## 2020-10-21 MED ORDER — SACUBITRIL-VALSARTAN 49-51 MG PO TABS
1.0000 | ORAL_TABLET | Freq: Two times a day (BID) | ORAL | Status: DC
  Administered 2020-10-21 – 2020-10-22 (×2): 1 via ORAL
  Filled 2020-10-21 (×3): qty 1

## 2020-10-21 MED ORDER — INSULIN DETEMIR 100 UNIT/ML ~~LOC~~ SOLN
30.0000 [IU] | Freq: Every day | SUBCUTANEOUS | Status: DC
  Filled 2020-10-21 (×2): qty 0.3

## 2020-10-21 MED ORDER — CARVEDILOL 25 MG PO TABS
25.0000 mg | ORAL_TABLET | Freq: Two times a day (BID) | ORAL | Status: DC
  Administered 2020-10-21 – 2020-10-22 (×2): 25 mg via ORAL
  Filled 2020-10-21 (×2): qty 1

## 2020-10-21 MED ORDER — ASPIRIN EC 81 MG PO TBEC
81.0000 mg | DELAYED_RELEASE_TABLET | Freq: Every day | ORAL | Status: DC
  Administered 2020-10-22 – 2020-10-24 (×3): 81 mg via ORAL
  Filled 2020-10-21 (×4): qty 1

## 2020-10-21 MED ORDER — INSULIN ASPART 100 UNIT/ML IJ SOLN
8.0000 [IU] | Freq: Once | INTRAMUSCULAR | Status: AC
  Administered 2020-10-21: 8 [IU] via INTRAVENOUS
  Filled 2020-10-21: qty 1

## 2020-10-21 MED ORDER — INSULIN ASPART 100 UNIT/ML IJ SOLN
0.0000 [IU] | Freq: Every day | INTRAMUSCULAR | Status: DC
Start: 2020-10-21 — End: 2020-10-25
  Administered 2020-10-21: 2 [IU] via SUBCUTANEOUS
  Filled 2020-10-21: qty 1

## 2020-10-21 MED ORDER — POTASSIUM CHLORIDE CRYS ER 20 MEQ PO TBCR
40.0000 meq | EXTENDED_RELEASE_TABLET | Freq: Once | ORAL | Status: AC
  Administered 2020-10-21: 40 meq via ORAL
  Filled 2020-10-21: qty 2

## 2020-10-21 MED ORDER — ACETAMINOPHEN 325 MG PO TABS
650.0000 mg | ORAL_TABLET | ORAL | Status: DC | PRN
  Filled 2020-10-21: qty 2

## 2020-10-21 MED ORDER — SODIUM CHLORIDE 0.9% FLUSH: 3.0000 mL | INTRAVENOUS | Status: DC | PRN

## 2020-10-21 MED ORDER — FUROSEMIDE 10 MG/ML IJ SOLN
40.0000 mg | Freq: Two times a day (BID) | INTRAMUSCULAR | Status: DC
  Administered 2020-10-21 – 2020-10-22 (×2): 40 mg via INTRAVENOUS
  Filled 2020-10-21 (×2): qty 4

## 2020-10-21 MED ORDER — EMPAGLIFLOZIN 25 MG PO TABS
25.0000 mg | ORAL_TABLET | Freq: Every day | ORAL | Status: DC
  Administered 2020-10-22 – 2020-10-23 (×2): 25 mg via ORAL
  Filled 2020-10-21 (×2): qty 1

## 2020-10-21 MED ORDER — STROKE: EARLY STAGES OF RECOVERY BOOK: Freq: Once | Status: DC

## 2020-10-21 MED ORDER — ONDANSETRON HCL 4 MG/2ML IJ SOLN
4.0000 mg | Freq: Four times a day (QID) | INTRAMUSCULAR | Status: DC | PRN
  Administered 2020-10-21: 4 mg via INTRAVENOUS
  Filled 2020-10-21: qty 2

## 2020-10-21 MED ORDER — FUROSEMIDE 10 MG/ML IJ SOLN
40.0000 mg | Freq: Once | INTRAMUSCULAR | Status: AC
  Administered 2020-10-21: 40 mg via INTRAVENOUS
  Filled 2020-10-21: qty 4

## 2020-10-21 MED ORDER — ASPIRIN 81 MG PO CHEW
324.0000 mg | CHEWABLE_TABLET | Freq: Once | ORAL | Status: AC
  Administered 2020-10-21: 324 mg via ORAL
  Filled 2020-10-21: qty 4

## 2020-10-21 MED ORDER — SODIUM CHLORIDE 0.9% FLUSH
3.0000 mL | Freq: Two times a day (BID) | INTRAVENOUS | Status: DC
  Administered 2020-10-21 – 2020-10-22 (×2): 3 mL via INTRAVENOUS

## 2020-10-21 MED ORDER — SODIUM CHLORIDE 0.9 % IV SOLN: 250.0000 mL | INTRAVENOUS | Status: DC | PRN

## 2020-10-21 MED ORDER — INSULIN DETEMIR 100 UNIT/ML ~~LOC~~ SOLN
17.0000 [IU] | Freq: Two times a day (BID) | SUBCUTANEOUS | Status: DC
  Administered 2020-10-21 – 2020-10-23 (×4): 17 [IU] via SUBCUTANEOUS
  Filled 2020-10-21 (×6): qty 0.17

## 2020-10-21 NOTE — H&P (Signed)
History and Physical    Taylor [name] IOE:[id number] DOB: 27-Feb-1965 DOA: 10/21/2020  PCP: Lauro Regulus, MD  Patient coming from: home  I have personally briefly reviewed patient's old medical records in [facility]  Chief Complaint: dizziness, generalized weakness x 3 days  HPI: Taylor [name] is a 55 y.o. female with medical history significant of  DMII insulin dependent, CKDIII, HLD, HTN, CHF ref followed by [facility] heart failure clinic, OSA complaint with CPAP who presents to ed with complaint of dizziness described as presyncope that started while she at work 3 days ago. Patient states she felt as if she was going to pass out and felt overall very weak. She has not associated chest pain , diaphoresis or increase sob. She states she rested and felt improved and was able to drive home. She notes over the next 2 days her symptoms remained persistent and she also note mild HA and n/v. She again denies chest pain, fever/chills/ sob /diarrhea /abdominal pain no increase confusion, difficulty with speech,or  focal weakness. She does however note chronic lower extremity swelling, cough , as well as orthopnea all of which have been  chronic issue w/o significant change.  She denies recent uri sxs or sick contacts.  ED Course:  Afeb, bp 131/94, HR:98, rr 19 Labs Wbc:10.2, hgb 15.3, mcv 101.1, plt 283 EKG: nsr nsr LAD  no hyer acute st twave changes ( some diffuse flattening of t waves) NA: 137, K 3.3, cr 0.57 UA:+wbc >50, mod heme no rbc, + squames, no bacteria  CE:35,41 BNP 1597.6 CT head IMPRESSION: 1. 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 2. No acute finding  CXR: Mild diffuse pulmonary interstitial edema  Resp panel negative  Glucose:303 Review of Systems: As per HPI otherwise 10 point review of systems negative.   Past Medical History:  Diagnosis Date   CHF (congestive heart failure) (HCC)    Diabetes mellitus without  complication (HCC)    Obstructive sleep apnea     Past Surgical History:  Procedure Laterality Date   ABDOMINAL HYSTERECTOMY       reports that she has quit smoking. She has never used smokeless tobacco. She reports that she does not drink alcohol and does not use drugs.  No Known Allergies  Family History  Problem Relation Age of Onset   Anemia Neg Hx    Arrhythmia Neg Hx    Asthma Neg Hx    Clotting disorder Neg Hx    Fainting Neg Hx    Heart attack Neg Hx    Heart disease Neg Hx    Heart failure Neg Hx    Hyperlipidemia Neg Hx    Hypertension Neg Hx     Prior to Admission medications   Medication Sig Start Date End Date Taking? Authorizing Provider  aspirin EC 81 MG EC tablet Take 1 tablet (81 mg total) by mouth daily. 09/04/18  Yes Gouru, Deanna Artis, MD  atorvastatin (LIPITOR) 40 MG tablet Take 1 tablet (40 mg total) by mouth daily. 03/10/18  Yes Clarisa Kindred A, FNP  carvedilol (COREG) 25 MG tablet TAKE 1 TABLET BY MOUTH TWICE A DAY Patient taking differently: Take 25 mg by mouth 2 (two) times daily with a meal. 09/14/19  Yes Hackney, Tina A, FNP  empagliflozin (JARDIANCE) 25 MG TABS tablet Take 1 tablet (25 mg total) by mouth daily. 01/16/20  Yes Hackney, Tina A, FNP  ENTRESTO 49-51 MG TAKE 1 TABLET BY  MOUTH TWICE A DAY Patient taking differently: Take 1 tablet by mouth 2 (two) times daily. 10/03/20  Yes Clarisa Kindred A, FNP  furosemide (LASIX) 20 MG tablet Take 1 tablet (20 mg total) by mouth as needed. Weight gain 2 pounds overnight, 5 pounds in a week, shortness of breath, or leg swelling 01/16/20  Yes Hackney, Tina A, FNP  glimepiride (AMARYL) 2 MG tablet Take 4 mg by mouth 2 (two) times daily. 02/15/15  Yes [provider]  insulin detemir (LEVEMIR) 100 unit/ml SOLN Inject 17 Units into the skin 2 (two) times daily.    Yes [provider]  metFORMIN (GLUCOPHAGE) 1000 MG tablet Take 1 tablet (1,000 mg total) 2 (two) times daily with a meal by mouth. 12/31/16   Yes Hackney, Tina A, FNP  potassium chloride SA (K-DUR) 20 MEQ tablet Take 1 tablet (20 mEq total) by mouth daily. Take 2 tablets daily for today and tomorrow only and then take 1 tablet daily Patient taking differently: Take 20 mEq by mouth once a week. On Mondays. 10/06/18  Yes Delma Freeze, FNP    Physical Exam: Vitals:   10/21/20 1049 10/21/20 1430 10/21/20 1500 10/21/20 1530  BP: (!) 131/94 107/79 112/83 122/84  Pulse: 98 97 99 100  Resp: 18  17   Temp: 98.4 F (36.9 C)     TempSrc: Oral     SpO2: 100% 95% 100% 97%  Weight: 70.3 kg     Height: 5\' 1"  (1.549 m)       Constitutional: NAD, calm, comfortable Vitals:   10/21/20 1049 10/21/20 1430 10/21/20 1500 10/21/20 1530  BP: (!) 131/94 107/79 112/83 122/84  Pulse: 98 97 99 100  Resp: 18  17   Temp: 98.4 F (36.9 C)     TempSrc: Oral     SpO2: 100% 95% 100% 97%  Weight: 70.3 kg     Height: 5\' 1"  (1.549 m)     Constitutional: NAD, calm, comfortable Eyes: PERRL, lids and conjunctivae normal ENMT: Mucous membranes are dry. Posterior pharynx clear of any exudate or lesions.Normal dentition.  Neck: normal, supple, no masses, no thyromegaly Respiratory: clear to auscultation mild decrease on right base, no wheezing, no crackles. Normal respiratory effort. No accessory muscle use.  Cardiovascular: Regular rate and rhythm, no murmurs / rubs / gallops. No extremity edema. 2+ pedal pulses. Abdomen: no tenderness, no masses palpated. No hepatosplenomegaly. Bowel sounds positive.  Musculoskeletal: no clubbing / cyanosis. No joint deformity upper and lower extremities. Good ROM, no contractures. Normal muscle tone.  Skin: no rashes, lesions, ulcers. No induration Neurologic: CN 2-12 grossly intact. Sensation intact, . Strength 5/5 in all except left lower 4/5 Psychiatric: Normal judgment and insight. Alert and oriented x 3. Normal mood.    Labs on Admission: I have personally reviewed following labs and imaging  studies  CBC: Recent Labs  Lab 10/21/20 1057  WBC 10.2  HGB 15.3*  HCT 47.1*  MCV 101.5*  PLT 283   Basic Metabolic Panel: Recent Labs  Lab 10/21/20 1057  NA 137  K 3.3*  CL 96*  CO2 28  GLUCOSE 399*  BUN 11  CREATININE 0.57  CALCIUM 8.9   GFR: Estimated Creatinine Clearance: 70.4 mL/min (by C-G formula based on SCr of 0.57 mg/dL). Liver Function Tests: Recent Labs  Lab 10/21/20 1057  AST 14*  ALT 12  ALKPHOS 108  BILITOT 1.5*  PROT 6.1*  ALBUMIN 2.8*   No results for input(s): LIPASE, AMYLASE in the  last 168 hours. No results for input(s): AMMONIA in the last 168 hours. Coagulation Profile: No results for input(s): INR, PROTIME in the last 168 hours. Cardiac Enzymes: No results for input(s): CKTOTAL, CKMB, CKMBINDEX, TROPONINI in the last 168 hours. BNP (last 3 results) No results for input(s): PROBNP in the last 8760 hours. HbA1C: No results for input(s): HGBA1C in the last 72 hours. CBG: Recent Labs  Lab 10/21/20 1433  GLUCAP 303*   Lipid Profile: No results for input(s): CHOL, HDL, LDLCALC, TRIG, CHOLHDL, LDLDIRECT in the last 72 hours. Thyroid Function Tests: No results for input(s): TSH, T4TOTAL, FREET4, T3FREE, THYROIDAB in the last 72 hours. Anemia Panel: No results for input(s): VITAMINB12, FOLATE, FERRITIN, TIBC, IRON, RETICCTPCT in the last 72 hours. Urine analysis:    Component Value Date/Time   COLORURINE YELLOW 10/21/2020 1057   APPEARANCEUR HAZY (A) 10/21/2020 1057   LABSPEC 1.020 10/21/2020 1057   PHURINE 5.5 10/21/2020 1057   GLUCOSEU >1,000 (A) 10/21/2020 1057   HGBUR MODERATE (A) 10/21/2020 1057   BILIRUBINUR SMALL (A) 10/21/2020 1057   KETONESUR 15 (A) 10/21/2020 1057   PROTEINUR >300 (A) 10/21/2020 1057   NITRITE NEGATIVE 10/21/2020 1057   LEUKOCYTESUR NEGATIVE 10/21/2020 1057    Radiological Exams on Admission: CT HEAD WO CONTRAST  Result Date: 10/21/2020 CLINICAL DATA:  Nonspecific dizziness EXAM: CT HEAD WITHOUT  CONTRAST TECHNIQUE: Contiguous axial images were obtained from the base of the skull through the vertex without intravenous contrast. COMPARISON:  11/08/2004 FINDINGS: Brain: Cortical and subcortical CSF density intervally seen along the anterior left frontal lobe. Chronic infarct with volume loss at the right caudate head. No visible acute infarct, hemorrhage, hydrocephalus, or mass effect. Vascular: No hyperdense vessel or unexpected calcification. Skull: Normal. Negative for fracture or focal lesion. Sinuses/Orbits: No acute finding. IMPRESSION: 1. 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 2. No acute finding Electronically Signed   By: Marnee SpringJonathon  Watts M.D.   On: 10/21/2020 11:43   DG Chest Portable 1 View  Result Date: 10/21/2020 CLINICAL DATA:  Weakness and dizziness. EXAM: PORTABLE CHEST 1 VIEW COMPARISON:  08/31/2018 FINDINGS: Mild cardiac enlargement. Mild diffuse pulmonary interstitial edema present, similar to the prior study. No significant pleural effusions. No pulmonary consolidation or pneumothorax identified. IMPRESSION: Mild diffuse pulmonary interstitial edema. Electronically Signed   By: Irish LackGlenn  Yamagata M.D.   On: 10/21/2020 13:20    EKG: Independently reviewed.see above  Assessment/Plan Acute on Chronic CHFrEF -[facility] -admit to progressive care  -place on CHF protocol (strict I/o, daily wt) -lasix 40 mg  iv bid  -continue on carvedilol , entresto, jardiance  -cardiology consult in am ( please call)   Abn CE -due to demand related to acute chf exacerbation  -to be complete will cycle ce  -echo in am   Dizzines, r/o TIA -CT noted with old infarct  -mri /carotids pending  -neuro , mild weakness on left lower extremity otherwise non-focal   Mild hypokalemia -replete prn   DMII,Uncontrolled  -last a1c -resume home lantus -place on iss/fs    HLD -continue statin    HTN -stable resume home  regimen as able   OSA  -CPAP qhs  DVT prophylaxis: lmwh Code Status: Full Family Communication:  husband at beside [phone] (Home Phone)  Disposition Plan:progressive care Consults called: consider cardiology in am  Admission status: progressive care    Lurline DelSara-Maiz A Timberly Yott MD [facility]   If 7PM-7AM, please contact  night-coverage [email or web address] Password [email or web address]  10/21/2020, 6:02 PM

## 2020-10-21 NOTE — Progress Notes (Signed)
*  PRELIMINARY RESULTS* Echocardiogram 2D Echocardiogram has been performed.  Neita Garnet Lorin Gawron 10/21/2020, 9:11 PM

## 2020-10-21 NOTE — Progress Notes (Signed)
Patient refused CPAP.

## 2020-10-21 NOTE — ED Triage Notes (Signed)
Pt here with dizziness since Friday. Pt states that she fell about 2 months ago and hit her head. Pt states that she also threw up this morning. Pt states that when she gets up she has to hold on to things to balance herself. Pt denies numbness in extremities.

## 2020-10-21 NOTE — Progress Notes (Signed)
Patient has MRI ordered; secure messaged Dr. Antionette Char to verify if patient can trasport/ be off tele for the MRI.  Dr. Antionette Char approved.

## 2020-10-21 NOTE — ED Provider Notes (Signed)
[facility] Emergency Department Provider Note    Event Date/Time   First MD Initiated Contact with Patient 10/21/20 1242     (approximate)  I have reviewed the triage vital signs and the nursing notes.   HISTORY  Chief Complaint Dizziness    HPI Taylor [name] is a 55 y.o. female below listed past medical history presents to the ER for evaluation of generalized malaise and dizziness that she on further questioning describes as a lightheadedness is constant but worsened when she stands up or has to walk around.  Has been feeling some orthopnea and shortness of breath with walking.  These symptoms all began several days ago.  Does not wear home oxygen.  Denies any chest pain or pressure.  No palpitations.  No nausea or vomiting.  No fevers.  Denies any dysuria.  Past Medical History:  Diagnosis Date   CHF (congestive heart failure) (HCC)    Diabetes mellitus without complication (HCC)    Obstructive sleep apnea    Family History  Problem Relation Age of Onset   Anemia Neg Hx    Arrhythmia Neg Hx    Asthma Neg Hx    Clotting disorder Neg Hx    Fainting Neg Hx    Heart attack Neg Hx    Heart disease Neg Hx    Heart failure Neg Hx    Hyperlipidemia Neg Hx    Hypertension Neg Hx    Past Surgical History:  Procedure Laterality Date   ABDOMINAL HYSTERECTOMY     Patient Active Problem List   Diagnosis Date Noted   Sepsis (HCC) 08/31/2018   CAP (community acquired pneumonia) 08/31/2018   Acute respiratory failure with hypoxia (HCC) 08/31/2018   Obstructive sleep apnea 09/03/2016   Diabetes (HCC) 09/14/2015   Tachycardia 09/14/2015   Acute on chronic systolic CHF (congestive heart failure) (HCC) 08/30/2015      Prior to Admission medications   Medication Sig Start Date End Date Taking? Authorizing Provider  aspirin EC 81 MG EC tablet Take 1 tablet (81 mg total) by mouth daily. 09/04/18   Gouru, Deanna Artis, MD  atorvastatin (LIPITOR) 40 MG tablet Take  1 tablet (40 mg total) by mouth daily. 03/10/18   Delma Freeze, FNP  carvedilol (COREG) 25 MG tablet TAKE 1 TABLET BY MOUTH TWICE A DAY 09/14/19   Clarisa Kindred A, FNP  empagliflozin (JARDIANCE) 25 MG TABS tablet Take 1 tablet (25 mg total) by mouth daily. 01/16/20   Delma Freeze, FNP  ENTRESTO 49-51 MG TAKE 1 TABLET BY MOUTH TWICE A DAY 10/03/20   Clarisa Kindred A, FNP  furosemide (LASIX) 20 MG tablet Take 1 tablet (20 mg total) by mouth as needed. Weight gain 2 pounds overnight, 5 pounds in a week, shortness of breath, or leg swelling 01/16/20   Clarisa Kindred A, FNP  glimepiride (AMARYL) 2 MG tablet Take 2 tablets by mouth 2 (two) times daily. 02/15/15   [provider]  insulin detemir (LEVEMIR) 100 unit/ml SOLN Inject 17 Units into the skin 2 (two) times daily.     [provider]  metFORMIN (GLUCOPHAGE) 1000 MG tablet Take 1 tablet (1,000 mg total) 2 (two) times daily with a meal by mouth. 12/31/16   Delma Freeze, FNP  potassium chloride SA (K-DUR) 20 MEQ tablet Take 1 tablet (20 mEq total) by mouth daily. Take 2 tablets daily for today and tomorrow only and then take 1 tablet daily Patient taking differently: Take 20  mEq by mouth once a week.  10/06/18   Delma Freeze, FNP    Allergies Patient has no known allergies.    Social History Social History   Tobacco Use   Smoking status: Former   Smokeless tobacco: Never  Substance Use Topics   Alcohol use: No    Comment: occ   Drug use: No    Review of Systems Patient denies headaches, rhinorrhea, blurry vision, numbness, shortness of breath, chest pain, edema, cough, abdominal pain, nausea, vomiting, diarrhea, dysuria, fevers, rashes or hallucinations unless otherwise stated above in HPI. ____________________________________________   PHYSICAL EXAM:  VITAL SIGNS: Vitals:   10/21/20 1049 10/21/20 1500  BP: (!) 131/94 112/83  Pulse: 98 99  Resp: 18 17  Temp: 98.4 F (36.9 C)   SpO2: 100% 100%     Constitutional: Alert and oriented.  Eyes: Conjunctivae are normal.  Head: Atraumatic. Nose: No congestion/rhinnorhea. Mouth/Throat: Mucous membranes are moist.   Neck: No stridor. Painless ROM.  Cardiovascular: Normal rate, regular rhythm. Grossly normal heart sounds.  Good peripheral circulation. Respiratory: Normal respiratory effort.  No retractions. Lungs CTAB. Gastrointestinal: Soft and nontender. No distention. No abdominal bruits. No CVA tenderness. Genitourinary:  Musculoskeletal: No lower extremity tenderness, trace BLE edema.  No joint effusions. Neurologic:  Normal speech and language. No gross focal neurologic deficits are appreciated. No facial droop Skin:  Skin is warm, dry and intact. No rash noted. Psychiatric: Mood and affect are normal. Speech and behavior are normal.  ____________________________________________   LABS (all labs ordered are listed, but only abnormal results are displayed)  Results for orders placed or performed during the hospital encounter of 10/21/20 (from the past 24 hour(s))  Basic metabolic panel     Status: Abnormal   Collection Time: 10/21/20 10:57 AM  Result Value Ref Range   Sodium 137 135 - 145 mmol/L   Potassium 3.3 (L) 3.5 - 5.1 mmol/L   Chloride 96 (L) 98 - 111 mmol/L   CO2 28 22 - 32 mmol/L   Glucose, Bld 399 (H) 70 - 99 mg/dL   BUN 11 6 - 20 mg/dL   Creatinine, Ser 1.61 0.44 - 1.00 mg/dL   Calcium 8.9 8.9 - 09.6 mg/dL   GFR, Estimated >04 >54 mL/min   Anion gap 13 5 - 15  CBC     Status: Abnormal   Collection Time: 10/21/20 10:57 AM  Result Value Ref Range   WBC 10.2 4.0 - 10.5 K/uL   RBC 4.64 3.87 - 5.11 MIL/uL   Hemoglobin 15.3 (H) 12.0 - 15.0 g/dL   HCT 09.8 (H) 11.9 - 14.7 %   MCV 101.5 (H) 80.0 - 100.0 fL   MCH 33.0 26.0 - 34.0 pg   MCHC 32.5 30.0 - 36.0 g/dL   RDW 82.9 56.2 - 13.0 %   Platelets 283 150 - 400 K/uL   nRBC 0.0 0.0 - 0.2 %  Urinalysis, Complete w Microscopic     Status: Abnormal   Collection  Time: 10/21/20 10:57 AM  Result Value Ref Range   Color, Urine YELLOW YELLOW   APPearance HAZY (A) CLEAR   Specific Gravity, Urine 1.020 1.005 - 1.030   pH 5.5 5.0 - 8.0   Glucose, UA >1,000 (A) NEGATIVE mg/dL   Hgb urine dipstick MODERATE (A) NEGATIVE   Bilirubin Urine SMALL (A) NEGATIVE   Ketones, ur 15 (A) NEGATIVE mg/dL   Protein, ur >865 (A) NEGATIVE mg/dL   Nitrite NEGATIVE NEGATIVE   Leukocytes,Ua NEGATIVE NEGATIVE  RBC / HPF 0-5 0 - 5 RBC/hpf   WBC, UA >50 (H) 0 - 5 WBC/hpf   Bacteria, UA NONE SEEN NONE SEEN   Squamous Epithelial / LPF 6-10 0 - 5   Mucus PRESENT   Hepatic function panel     Status: Abnormal   Collection Time: 10/21/20 10:57 AM  Result Value Ref Range   Total Protein 6.1 (L) 6.5 - 8.1 g/dL   Albumin 2.8 (L) 3.5 - 5.0 g/dL   AST 14 (L) 15 - 41 U/L   ALT 12 0 - 44 U/L   Alkaline Phosphatase 108 38 - 126 U/L   Total Bilirubin 1.5 (H) 0.3 - 1.2 mg/dL   Bilirubin, Direct 0.2 0.0 - 0.2 mg/dL   Indirect Bilirubin 1.3 (H) 0.3 - 0.9 mg/dL  Troponin I (High Sensitivity)     Status: Abnormal   Collection Time: 10/21/20 10:57 AM  Result Value Ref Range   Troponin I (High Sensitivity) 35 (H) <18 ng/L  Brain natriuretic peptide     Status: Abnormal   Collection Time: 10/21/20 10:57 AM  Result Value Ref Range   B Natriuretic Peptide 1,597.6 (H) 0.0 - 100.0 pg/mL  Resp Panel by RT-PCR (Flu A&B, Covid) Nasopharyngeal Swab     Status: None   Collection Time: 10/21/20  1:24 PM   Specimen: Nasopharyngeal Swab; Nasopharyngeal(NP) swabs in vial transport medium  Result Value Ref Range   SARS Coronavirus 2 by RT PCR NEGATIVE NEGATIVE   Influenza A by PCR NEGATIVE NEGATIVE   Influenza B by PCR NEGATIVE NEGATIVE  CBG monitoring, ED     Status: Abnormal   Collection Time: 10/21/20  2:33 PM  Result Value Ref Range   Glucose-Capillary 303 (H) 70 - 99 mg/dL  Troponin I (High Sensitivity)     Status: Abnormal   Collection Time: 10/21/20  3:11 PM  Result Value Ref Range    Troponin I (High Sensitivity) 41 (H) <18 ng/L   ____________________________________________  EKG My review and personal interpretation at Time: 10:54   Indication: dizziness  Rate: 95  Rhythm: sinus Axis: normal Other: borderline prolonged qt, no stemi ____________________________________________  RADIOLOGY  I personally reviewed all radiographic images ordered to evaluate for the above acute complaints and reviewed radiology reports and findings.  These findings were personally discussed with the patient.  Please see medical record for radiology report.  ____________________________________________   PROCEDURES  Procedure(s) performed:  Procedures    Critical Care performed: no ____________________________________________   INITIAL IMPRESSION / ASSESSMENT AND PLAN / ED COURSE  Pertinent labs & imaging results that were available during my care of the patient were reviewed by me and considered in my medical decision making (see chart for details).   DDX: chf, pna, uti, dm, dehydration, acs, anasarca, orthostasis  Taylor [name] is a 55 y.o. who presents to the ED with advanced CHF presents to the ER for lightheadedness orthopnea and exertional dyspnea that is worsening over the past several days.  Denies any numbness or tingling.  CT imaging with chronic appearing infarcts.  Borderline low blood pressure she is not any respiratory distress.  Noted to be hyperglycemic so initially had ordered some IV fluids but after reviewing chest x-ray fluids were stopped after she received only about 150 cc due to findings suggestive of CHF.  She does have crackles on exam.  She is maintaining her sats while at rest.  Will give subcu insulin no sign of DKA.  She is not complaining of  any chest pain right now but does appear significantly fatigued and ill.  Clinical Course as of 10/21/20 1605  Sun Oct 21, 2020  1241 CT HEAD WO CONTRAST [PR]  1551 Patient's troponins elevated but stable.   Persistently hyperglycemic.  Low albumin.  BNP elevated same level as it was when she came in acute respiratory failure significant pulmonary edema and was admitted for 1 week.  Patient not showing any hypoxia but with ambulation severely weak feeling lightheaded and does feel short of breath.  Only able to walk a few feet before having to stop.  I do suspect worsening CHF.  Will give IV Lasix.  Have given potassium supplement.  Will discuss with hospitalist for observation. [PR]    Clinical Course User Index [PR] Willy Eddy, MD    The patient was evaluated in Emergency Department today for the symptoms described in the history of present illness. He/she was evaluated in the context of the global COVID-19 pandemic, which necessitated consideration that the patient might be at risk for infection with the SARS-CoV-2 virus that causes COVID-19. Institutional protocols and algorithms that pertain to the evaluation of patients at risk for COVID-19 are in a state of rapid change based on information released by regulatory bodies including the CDC and federal and state organizations. These policies and algorithms were followed during the patient's care in the ED.  As part of my medical decision making, I reviewed the following data within the electronic MEDICAL RECORD NUMBER Nursing notes reviewed and incorporated, Labs reviewed, notes from prior ED visits and [place] Controlled Substance Database   ____________________________________________   FINAL CLINICAL IMPRESSION(S) / ED DIAGNOSES  Final diagnoses:  Dizziness  Acute on chronic systolic CHF (congestive heart failure) (HCC)      NEW MEDICATIONS STARTED DURING THIS VISIT:  New Prescriptions   No medications on file     Note:  This document was prepared using Dragon voice recognition software and may include unintentional dictation errors.    Willy Eddy, MD 10/21/20 [phone]

## 2020-10-21 NOTE — ED Notes (Signed)
See triage note  Presents with dizziness   States she developed dizziness while at work on Friday  Denies any other sx's  No n/v/d   Fever or pain

## 2020-10-21 NOTE — ED Notes (Signed)
Ambulating O2 saturation preformed per Md order. Pt c/o minor lightheadedness upon standing. Oxygen saturation maintained at 96% while ambulation. Orthostatic VS also obtained. See chart for results. Md notified.

## 2020-10-22 DIAGNOSIS — I9589 Other hypotension: Secondary | ICD-10-CM

## 2020-10-22 DIAGNOSIS — I952 Hypotension due to drugs: Secondary | ICD-10-CM

## 2020-10-22 DIAGNOSIS — E861 Hypovolemia: Secondary | ICD-10-CM

## 2020-10-22 DIAGNOSIS — R42 Dizziness and giddiness: Secondary | ICD-10-CM

## 2020-10-22 LAB — GLUCOSE, CAPILLARY
Glucose-Capillary: 82 mg/dL (ref 70–99)
Glucose-Capillary: 264 mg/dL — ABNORMAL HIGH (ref 70–99)
Glucose-Capillary: 97 mg/dL (ref 70–99)
Glucose-Capillary: 139 mg/dL — ABNORMAL HIGH (ref 70–99)

## 2020-10-22 LAB — ECHOCARDIOGRAM COMPLETE
AR max vel: 1.5 cm2
AV Peak grad: 3.6 mmHg
Ao pk vel: 0.95 m/s
Area-P 1/2: 6.37 cm2
Calc EF: 17.1 %
Height: 61 in
S' Lateral: 5.5 cm
Single Plane A2C EF: 20.9 %
Single Plane A4C EF: 18.3 %
Weight: 2480 oz

## 2020-10-22 LAB — BASIC METABOLIC PANEL
Anion gap: 7 (ref 5–15)
BUN: 13 mg/dL (ref 6–20)
CO2: 33 mmol/L — ABNORMAL HIGH (ref 22–32)
Calcium: 8.7 mg/dL — ABNORMAL LOW (ref 8.9–10.3)
Chloride: 101 mmol/L (ref 98–111)
Creatinine, Ser: 0.66 mg/dL (ref 0.44–1.00)
GFR, Estimated: >60 mL/min (ref >60)
Glucose, Bld: 77 mg/dL (ref 70–99)
Potassium: 3.4 mmol/L — ABNORMAL LOW (ref 3.5–5.1)
Sodium: 141 mmol/L (ref 135–145)

## 2020-10-22 LAB — LIPID PANEL
Cholesterol: 341 mg/dL — ABNORMAL HIGH (ref 0–200)
HDL: 89 mg/dL (ref >40)
LDL Cholesterol: 236 mg/dL — ABNORMAL HIGH (ref 0–99)
Total CHOL/HDL Ratio: 3.8 RATIO
Triglycerides: 82 mg/dL (ref <150)
VLDL: 16 mg/dL (ref 0–40)

## 2020-10-22 LAB — HIV ANTIBODY (ROUTINE TESTING W REFLEX): HIV Screen 4th Generation wRfx: NONREACTIVE

## 2020-10-22 LAB — VITAMIN B12: Vitamin B-12: 573 pg/mL (ref 180–914)

## 2020-10-22 MED ORDER — POTASSIUM CHLORIDE CRYS ER 20 MEQ PO TBCR
40.0000 meq | EXTENDED_RELEASE_TABLET | Freq: Once | ORAL | Status: AC
  Administered 2020-10-22: 40 meq via ORAL
  Filled 2020-10-22: qty 4

## 2020-10-22 MED ORDER — SODIUM CHLORIDE 0.9 % IV SOLN
INTRAVENOUS | Status: DC
  Administered 2020-10-22: 1000 mL via INTRAVENOUS

## 2020-10-22 MED ORDER — ENOXAPARIN SODIUM 40 MG/0.4ML IJ SOSY
40.0000 mg | PREFILLED_SYRINGE | INTRAMUSCULAR | Status: DC
  Administered 2020-10-22 – 2020-10-23 (×2): 40 mg via SUBCUTANEOUS
  Filled 2020-10-22 (×2): qty 0.4

## 2020-10-22 MED ORDER — SODIUM CHLORIDE 0.9 % IV BOLUS
250.0000 mL | Freq: Once | INTRAVENOUS | Status: AC
  Administered 2020-10-22: 250 mL via INTRAVENOUS

## 2020-10-22 NOTE — Progress Notes (Signed)
Cpap declined by pt 

## 2020-10-22 NOTE — Plan of Care (Signed)
  Problem: Education: Goal: Knowledge of General Education information will improve Description Including pain rating scale, medication(s)/side effects and non-pharmacologic comfort measures Outcome: Progressing   Problem: Health Behavior/Discharge Planning: Goal: Ability to manage health-related needs will improve Outcome: Progressing   

## 2020-10-22 NOTE — Evaluation (Signed)
Occupational Therapy Evaluation Patient Details Name: Taylor [name] MRN: [id number] DOB: 08/24/1965 Today's Date: 10/22/2020    History of Present Illness 55 y.o. female with medical history significant of DMII, CKDIII, HLD, HTN, CHF, OSA, who presents to ED with complaint of dizziness described as presyncope that started while she at work 3 days ago. Patient states she felt as if she was going to pass out and felt overall very weak. Pt admitted for acute on chronic CHF. CT head & MRI brain: no acute finding.   Clinical Impression   Pt seen for OT evaluation this date. Prior to admission, pt was independent with ADLs, IADLs, and functional mobility (no AD), living with husband in an apartment with level entry. At baseline, pt works and drives. Pt currently presents with increased dizziness, decreased balance, and decreased strength, and requires SUPERVISION/SET-UP for UB ADLs seated at EOB, MIN GUARD + 1-person HHA for functional mobility of short household distances (~49ft), and  MIN GUARD for standing grooming tasks.  Of note, pt c/o of dizziness once seated EOB and vitals obtained with further mobility: - Sitting: BP 82/61, HR 77 - Standing: BP 82/64, HR 71 - Sitting (following 2 mins of standing ADLs): BP 70/55, HR 78 - Sitting (following seated therapy exercises and with legs elevated): BP 73/59, HR 79.   At end of session, pt left seated in recliner, in no acute distress, with RN present. Pt would benefit from additional skilled OT services to maximize return to PLOF and minimize risk of future falls, injury, caregiver burden, and readmission. Upon discharge, recommend HHOT services.        Follow Up Recommendations  Home health OT;Supervision - Intermittent    Equipment Recommendations  Other (comment) (TBD)       Precautions / Restrictions Precautions Precautions: Fall Restrictions Weight Bearing Restrictions: No      Mobility Bed Mobility Overal bed mobility: Modified  Independent             General bed mobility comments: Able to perform with ease with HOB slightly elevated + use of bedrails    Transfers Overall transfer level: Needs assistance   Transfers: Sit to/from Stand Sit to Stand: Min guard         General transfer comment: MIN GUARD d/t pt reporting dizziness with change in position    Balance Overall balance assessment: Needs assistance Sitting-balance support: No upper extremity supported;Feet supported Sitting balance-Leahy Scale: Good Sitting balance - Comments: Good sitting balance reaching within BOS while seated at EOB   Standing balance support: Single extremity supported;During functional activity Standing balance-Leahy Scale: Fair Standing balance comment: MIN GUARD + 1-person HHA for functional mobility of short household distances (~64ft)                           ADL either performed or assessed with clinical judgement   ADL Overall ADL's : Needs assistance/impaired                                       General ADL Comments: SUPERVISION/SET-UP for UB ADLs seated at EOB. MIN GUARD for standing grooming tasks. MIN GUARD + 1-person HHA for functional mobility of short household distances (~19ft)      Pertinent Vitals/Pain Pain Assessment: No/denies pain        Extremity/Trunk Assessment Upper Extremity Assessment Upper Extremity Assessment: Overall [facility]  for tasks assessed   Lower Extremity Assessment Lower Extremity Assessment: Generalized weakness;Defer to PT evaluation       Communication Communication Communication: No difficulties   Cognition Arousal/Alertness: Awake/alert Behavior During Therapy: WFL for tasks assessed/performed Overall Cognitive Status: Within Functional Limits for tasks assessed                                 General Comments: A&Ox4. Pleasant and agreeable throughout   General Comments  Pt c/o of dizziness once seated EOB. Vitals  obtained with further mobility. Sitting BP 82/61, HR 77; Standing BP 82/64, HR 71; Sitting (following of standing grooming tasks) BP 70/55, HR 78; Sitting (with legs elevated following seated therapy exercises) BP 73/59, HR 79. Pt left in no acute distress seated in recliner with RN present    Exercises Other Exercises Other Exercises: Education on role of OT and POC        Home Living Family/patient expects to be discharged to:: Private residence Living Arrangements: Spouse/significant other Available Help at Discharge: Family Type of Home: Apartment Home Access: Level entry     Home Layout: One level     Bathroom Shower/Tub: Tub/shower unit         Home Equipment: None          Prior Functioning/Environment Level of Independence: Independent        Comments: Independent with ADLs, IADLs, and functional mobility (no AD). Pt works and drives. Pt endorses 1 fall 2 months ago        OT Problem List: Decreased activity tolerance;Impaired balance (sitting and/or standing);Cardiopulmonary status limiting activity      OT Treatment/Interventions: Self-care/ADL training;Therapeutic exercise;Energy conservation;DME and/or AE instruction;Therapeutic activities;Patient/family education;Balance training    OT Goals(Current goals can be found in the care plan section) Acute Rehab OT Goals Patient Stated Goal: to get back to work OT Goal Formulation: With patient/family Time For Goal Achievement: 11/05/20 Potential to Achieve Goals: Fair ADL Goals Pt Will Perform Grooming: with modified independence;standing Pt Will Transfer to Toilet: with modified independence;ambulating;regular height toilet Pt Will Perform Toileting - Clothing Manipulation and hygiene: with modified independence;sit to/from stand  OT Frequency: Min 1X/week    AM-PAC OT "6 Clicks" Daily Activity     Outcome Measure Help from another person eating meals?: None Help from another person taking care  of personal grooming?: A Little Help from another person toileting, which includes using toliet, bedpan, or urinal?: A Little Help from another person bathing (including washing, rinsing, drying)?: A Little Help from another person to put on and taking off regular upper body clothing?: None Help from another person to put on and taking off regular lower body clothing?: A Little 6 Click Score: 20   End of Session Equipment Utilized During Treatment: Gait belt Nurse Communication: Mobility status;Other (comment) (vitals)  Activity Tolerance: Patient tolerated treatment well Patient left: in chair;with call bell/phone within reach;with nursing/sitter in room;with family/visitor present  OT Visit Diagnosis: Unsteadiness on feet (R26.81);Muscle weakness (generalized) (M62.81);History of falling (Z91.81)                Time: 4132-4401 OT Time Calculation (min): 24 min Charges:  OT General Charges $OT Visit: 1 Visit OT Evaluation $OT Eval Moderate Complexity: 1 Mod OT Treatments $Self Care/Home Management : 8-22 mins  Matthew Folks, OTR/L [facility] [phone]

## 2020-10-22 NOTE — Progress Notes (Addendum)
Taylor [name]  YQM:[id number] DOB: 1966-01-18 DOA: 10/21/2020 PCP: Lauro Regulus, MD    Brief Narrative:  55 year old with a history of DM2, CKD stage III, HLD, HTN, CHF, and OSA on chronic CPAP who presented to the ED with complaints of dizziness and a presyncopal spell.  She denied chest pain diaphoresis or shortness of breath.  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  Consultants:  None  Code Status: FULL CODE  Antimicrobials:  None  DVT prophylaxis: Lovenox  Subjective: Afebrile.  Has suffered episodes of hypotension this morning w/ BP dipping into the upper 70s and lower 80s systolic. Reports dizziness with these episodes. Reports generalized weakness.  Denies chest pain abdominal pain nausea.  Reports fair to moderate appetite only.  Assessment & Plan:  Dizziness -presyncope - hypotension  Appears to be due to dehydration (due to diuretic) as well as medication - holding BP meds - stopping diuretic - gently hydrating today - follow BP  Severe Chronic systolic CHF - EF 20-25% (TTE 07/15/39) Followed in the [facility] heart failure clinic - on lasix at home - holding beta-blocker and Entresto due to hypotension - cont Jardiance for now (but aware can cause orthostatic hypotension) - baseline weight 72 kg November 2021 - pt well below this prior weight   Filed Weights   10/21/20 1049 10/21/20 2150 10/22/20 0510  Weight: 70.3 kg 65.5 kg 63 kg    ?CVA v/s TIA - ruled out old infarcts noted on CT head - MRI without acute finding -no hemodynamically significant carotid stenosis via Doppler - presenting sx explained by above   Elevated troponin No symptoms to suggest MI - suspect mild demand ischemia in setting of hypotension/DH  Hypokalemia Due to diuretic -supplement and follow -check magnesium  Uncontrolled DM2 CBG uncontrolled initially but much improved as inpatient - continue to follow with current  treatment  Macrocytosis B12 level confirmed to be normal  HLD Continue usual statin -LDL markedly elevated - ?noncompliance - f/u as outpt   HTN Not an active problem at this time   OSA Continue usual home CPAP regimen    Family Communication: spoke w/ companion at bedside  Status is: Inpatient  Remains inpatient appropriate because:Inpatient level of care appropriate due to severity of illness  Dispo: The patient is from: Home              Anticipated d/c is to: Home              Patient currently is not medically stable to d/c.   Difficult to place patient No   Objective: Blood pressure 107/76, pulse 84, temperature (!) 97.5 F (36.4 C), resp. rate 18, height 5\' 1"  (1.549 m), weight 63 kg, SpO2 99 %.  Intake/Output Summary (Last 24 hours) at 10/22/2020 1101 Last data filed at 10/22/2020 0510 Gross per 24 hour  Intake 120 ml  Output --  Net 120 ml   Filed Weights   10/21/20 1049 10/21/20 2150 10/22/20 0510  Weight: 70.3 kg 65.5 kg 63 kg    Examination: General: No acute respiratory distress Lungs: Clear to auscultation bilaterally without wheezes or crackles Cardiovascular: Regular rate and rhythm without murmur gallop or rub normal S1 and S2 Abdomen: Nontender, nondistended, soft, bowel sounds positive, no rebound, no ascites, no appreciable mass Extremities: No significant cyanosis, clubbing, or edema bilateral lower extremities  CBC: Recent Labs  Lab 10/21/20 1057  WBC  10.2  HGB 15.3*  HCT 47.1*  MCV 101.5*  PLT 283   Basic Metabolic Panel: Recent Labs  Lab 10/21/20 1057 10/22/20 0449  NA 137 141  K 3.3* 3.4*  CL 96* 101  CO2 28 33*  GLUCOSE 399* 77  BUN 11 13  CREATININE 0.57 0.66  CALCIUM 8.9 8.7*   GFR: Estimated Creatinine Clearance: 66.8 mL/min (by C-G formula based on SCr of 0.66 mg/dL).  Liver Function Tests: Recent Labs  Lab 10/21/20 1057  AST 14*  ALT 12  ALKPHOS 108  BILITOT 1.5*  PROT 6.1*  ALBUMIN 2.8*    Cardiac  Enzymes: Recent Labs  Lab 10/21/20 1512  CKTOTAL 34*    HbA1C: Hgb A1c MFr Bld  Date/Time Value Ref Range Status  09/01/2018 02:33 AM 9.5 (H) 4.8 - 5.6 % Final    Comment:    (NOTE) Pre diabetes:          5.7%-6.4% Diabetes:              >6.4% Glycemic control for   <7.0% adults with diabetes     CBG: Recent Labs  Lab 10/21/20 1433 10/21/20 1814 10/21/20 2203 10/22/20 0748  GLUCAP 303* 336* 217* 82    Recent Results (from the past 240 hour(s))  Resp Panel by RT-PCR (Flu A&B, Covid) Nasopharyngeal Swab     Status: None   Collection Time: 10/21/20  1:24 PM   Specimen: Nasopharyngeal Swab; Nasopharyngeal(NP) swabs in vial transport medium  Result Value Ref Range Status   SARS Coronavirus 2 by RT PCR NEGATIVE NEGATIVE Final    Comment: (NOTE) SARS-CoV-2 target nucleic acids are NOT DETECTED.  The SARS-CoV-2 RNA is generally detectable in upper respiratory specimens during the acute phase of infection. The lowest concentration of SARS-CoV-2 viral copies this assay can detect is 138 copies/mL. A negative result does not preclude SARS-Cov-2 infection and should not be used as the sole basis for treatment or other patient management decisions. A negative result may occur with  improper specimen collection/handling, submission of specimen other than nasopharyngeal swab, presence of viral mutation(s) within the areas targeted by this assay, and inadequate number of viral copies(<138 copies/mL). A negative result must be combined with clinical observations, patient history, and epidemiological information. The expected result is Negative.  Fact Sheet for Patients:  BloggerCourse.com  Fact Sheet for Healthcare Providers:  SeriousBroker.it  This test is no t yet approved or cleared by the [place] FDA and  has been authorized for detection and/or diagnosis of SARS-CoV-2 by FDA under an Emergency Use Authorization  (EUA). This EUA will remain  in effect (meaning this test can be used) for the duration of the COVID-19 declaration under Section 564(b)(1) of the Act, 21 U.S.C.section 360bbb-3(b)(1), unless the authorization is terminated  or revoked sooner.       Influenza A by PCR NEGATIVE NEGATIVE Final   Influenza B by PCR NEGATIVE NEGATIVE Final    Comment: (NOTE) The Xpert Xpress SARS-CoV-2/FLU/RSV plus assay is intended as an aid in the diagnosis of influenza from Nasopharyngeal swab specimens and should not be used as a sole basis for treatment. Nasal washings and aspirates are unacceptable for Xpert Xpress SARS-CoV-2/FLU/RSV testing.  Fact Sheet for Patients: BloggerCourse.com  Fact Sheet for Healthcare Providers: SeriousBroker.it  This test is not yet approved or cleared by the [place] FDA and has been authorized for detection and/or diagnosis of SARS-CoV-2 by FDA under an Emergency Use Authorization (EUA). This EUA will remain in  effect (meaning this test can be used) for the duration of the COVID-19 declaration under Section 564(b)(1) of the Act, 21 U.S.C. section 360bbb-3(b)(1), unless the authorization is terminated or revoked.  Performed at [facility], [address] [place], [place] [address]      Scheduled Meds:   stroke: mapping our early stages of recovery book   Does not apply Once   aspirin EC  81 mg Oral Daily   atorvastatin  40 mg Oral Daily   carvedilol  25 mg Oral BID   empagliflozin  25 mg Oral Daily   furosemide  40 mg Intravenous Q12H   heparin  5,000 Units Subcutaneous Q8H   insulin aspart  0-15 Units Subcutaneous TID WC   insulin aspart  0-5 Units Subcutaneous QHS   insulin detemir  17 Units Subcutaneous BID   sacubitril-valsartan  1 tablet Oral BID   sodium chloride flush  3 mL Intravenous Q12H   Continuous Infusions:  sodium chloride       LOS: 1 day   Lonia Blood,  MD [facility] Office  [phone] Pager - Text Page per Amion  If 7PM-7AM, please contact night-coverage per Amion 10/22/2020, 11:01 AM

## 2020-10-22 NOTE — Progress Notes (Signed)
TOC consult for heart failure home health screen. Informed Heart Failure RN Jimsey.   [place], [place] [phone]

## 2020-10-22 NOTE — Progress Notes (Addendum)
PT Cancellation Note  Patient Details Name: Taylor [name] MRN: [id number] DOB: 09-16-1965   Cancelled Treatment:    Reason Eval/Treat Not Completed: Medical issues which prohibited therapy.  Chart reviewed physical therapy evaluation attempted.  Patient received supine in bed trying to rest.  Patient reports feeling continuously lightheaded recently today.  BP assessed and continued hypotension noted (see below).  RN reports having orders to administer bolus.  Patient denies any loss of vision, diplopia, difficulty focusing on text or images.  As patient was even more so hypotensive with OT earlier this date, will hold off on physical therapy assessment until patient's pressures are more stable.  Patient reports at baseline typically is 120 mmHg (SBP) at her outpatient doctors visits while medicated for HTN.    Today's Vitals   10/22/20 0746 10/22/20 0800 10/22/20 1118 10/22/20 1449  BP: 107/76  91/62 (!) 81/63  Pulse: 84  78 80  Resp: 18  18   Temp: (!) 97.5 F (36.4 C)  97.8 F (36.6 C)   TempSrc:   Oral   SpO2: 99%  97%   Weight:      Height:      PainSc:  0-No pain     Orthostatic VS for the past 24 hrs:  BP- Sitting Pulse- Sitting BP- Standing at 0 minutes Pulse- Standing at 0 minutes  10/22/20 1247 (!) 73/59 79 -- --  10/22/20 1245 (!) 82/61 77 (!) 82/64 71    2:55 PM, 10/22/20 [name], PT, DPT Physical Therapist - [facility]  [phone] (ASCOM)    [name] C 10/22/2020, 2:54 PM

## 2020-10-22 NOTE — Progress Notes (Signed)
SLP Cancellation Note  Patient Details Name: Taylor [name] MRN: [id number] DOB: [birth year]   Cancelled treatment:       Reason Eval/Treat Not Completed: SLP screened, no needs identified, will sign off (chart reviewed; consulted NSG then met w/ pt) Pt denied any difficulty swallowing and is currently on a regular diet; tolerates swallowing pills w/ water per NSG. Pt conversed in conversation w/out overt expressive/receptive deficits noted; pt denied any speech-language deficits. Speech clear. She asked for breakfast items and tray setup b/f this SLP left room.  No further skilled ST services indicated as pt appears at her baseline. Pt agreed. NSG to reconsult if any change in status while admitted.       Orinda Kenner, MS, CCC-SLP Speech Language Pathologist Rehab Services [phone] [facility] 10/22/2020, 8:58 AM

## 2020-10-23 DIAGNOSIS — I959 Hypotension, unspecified: Secondary | ICD-10-CM | POA: Diagnosis present

## 2020-10-23 DIAGNOSIS — R42 Dizziness and giddiness: Secondary | ICD-10-CM

## 2020-10-23 LAB — CBC
HCT: 48.4 % — ABNORMAL HIGH (ref 36.0–46.0)
Hemoglobin: 15.3 g/dL — ABNORMAL HIGH (ref 12.0–15.0)
MCH: 32.1 pg (ref 26.0–34.0)
MCHC: 31.6 g/dL (ref 30.0–36.0)
MCV: 101.7 fL — ABNORMAL HIGH (ref 80.0–100.0)
Platelets: 291 10*3/uL (ref 150–400)
RBC: 4.76 MIL/uL (ref 3.87–5.11)
RDW: 12.2 % (ref 11.5–15.5)
WBC: 9.5 10*3/uL (ref 4.0–10.5)
nRBC: 0 % (ref 0.0–0.2)

## 2020-10-23 LAB — BASIC METABOLIC PANEL
Anion gap: 6 (ref 5–15)
BUN: 14 mg/dL (ref 6–20)
CO2: 31 mmol/L (ref 22–32)
Calcium: 8.1 mg/dL — ABNORMAL LOW (ref 8.9–10.3)
Chloride: 104 mmol/L (ref 98–111)
Creatinine, Ser: 0.7 mg/dL (ref 0.44–1.00)
GFR, Estimated: >60 mL/min (ref >60)
Glucose, Bld: 58 mg/dL — ABNORMAL LOW (ref 70–99)
Potassium: 3.2 mmol/L — ABNORMAL LOW (ref 3.5–5.1)
Sodium: 141 mmol/L (ref 135–145)

## 2020-10-23 LAB — GLUCOSE, CAPILLARY
Glucose-Capillary: 110 mg/dL — ABNORMAL HIGH (ref 70–99)
Glucose-Capillary: 120 mg/dL — ABNORMAL HIGH (ref 70–99)
Glucose-Capillary: 159 mg/dL — ABNORMAL HIGH (ref 70–99)
Glucose-Capillary: 102 mg/dL — ABNORMAL HIGH (ref 70–99)
Glucose-Capillary: 93 mg/dL (ref 70–99)

## 2020-10-23 LAB — HEMOGLOBIN A1C
Hgb A1c MFr Bld: 13.4 % — ABNORMAL HIGH (ref 4.8–5.6)
Hgb A1c MFr Bld: 13.4 % — ABNORMAL HIGH (ref 4.8–5.6)
Mean Plasma Glucose: 338 mg/dL
Mean Plasma Glucose: 338 mg/dL

## 2020-10-23 LAB — MAGNESIUM: Magnesium: 1.7 mg/dL (ref 1.7–2.4)

## 2020-10-23 MED ORDER — SODIUM CHLORIDE 0.9 % IV SOLN: INTRAVENOUS | Status: DC

## 2020-10-23 MED ORDER — MAGNESIUM OXIDE -MG SUPPLEMENT 400 (240 MG) MG PO TABS
800.0000 mg | ORAL_TABLET | Freq: Once | ORAL | Status: AC
  Administered 2020-10-23: 800 mg via ORAL
  Filled 2020-10-23: qty 2

## 2020-10-23 MED ORDER — POTASSIUM CHLORIDE CRYS ER 20 MEQ PO TBCR
40.0000 meq | EXTENDED_RELEASE_TABLET | Freq: Once | ORAL | Status: AC
  Administered 2020-10-23: 40 meq via ORAL
  Filled 2020-10-23: qty 4

## 2020-10-23 NOTE — Progress Notes (Signed)
Inpatient Diabetes Program Recommendations  AACE/ADA: New Consensus Statement on Inpatient Glycemic Control (2015)  Target Ranges:  Prepandial:   less than 140 mg/dL      Peak postprandial:   less than 180 mg/dL (1-2 hours)      Critically ill patients:  140 - 180 mg/dL   Lab Results  Component Value Date   GLUCAP 110 (H) 10/23/2020   HGBA1C 13.4 (H) 10/22/2020    Review of Glycemic Control Results for MARCEE, JACOBS (MRN [id number]) as of 10/23/2020 11:16  Ref. Range 10/22/2020 07:48 10/22/2020 11:20 10/22/2020 16:15 10/22/2020 20:54 10/23/2020 07:45 10/23/2020 08:00  Glucose-Capillary Latest Ref Range: 70 - 99 mg/dL 82 280 (H) 97 034 (H) 917 (H) 110 (H)   Diabetes history: DM2 Outpatient Diabetes medications: Levemir 17 units bid, Amaryl 4 mg bid, Metformin 1 gm bid Current orders for Inpatient glycemic control: Levemir 17 units bid,Novolog 0-15 units tid,0-5 units hs  Inpatient Diabetes Program Recommendations:   Consider: -Decrease Levemir to 15 units bid (fasting 82 yesterday and today  blood glucose 77 @ 0449 am.)  Spoke with patient regarding A1c of 13.4 (average blood glucose of 338 over the past 2-3 months). Patient shared that she does not always have money to buy her insulin and Jardiance. Patient has insurance, but with increase with rent, does not have money to keep taking insulin or Jardiance @ times. Any suggestions appreciated. Reviewed with patient could speak with MD about change to [employer] insulin but not a lot cheaper than she is paying now. Levemir costs her $150 for 3 months. [place] Pepper is $75 per month. Consult to transition of care placed regarding medication resources if available.  Thank you, Billy Fischer. Jule Schlabach, RN, MSN, CDE  Diabetes Coordinator Inpatient Glycemic Control Team Team Pager [phone] (8am-5pm) 10/23/2020 11:39 AM

## 2020-10-23 NOTE — Progress Notes (Addendum)
Progress Note    Taylor [name]  YKD:[id number] DOB: [birth year]  DOA: 10/21/2020 PCP: Lauro Regulus, MD      Brief Narrative:    Medical records reviewed and are as summarized below:  Taylor [name] is a 55 y.o. female with a history of DM2, CKD stage III, HLD, HTN, CHF, and OSA on chronic CPAP who presented to the ED with complaints of dizziness and a presyncopal spell. Her symptoms were attributed to hypotension from diuretics and antihypertensives.  Lasix, carvedilol and Entresto were held.  She was given IV fluids for hydration.  Assessment/Plan:   Principal Problem:   Dizziness Active Problems:   CHF (congestive heart failure) (HCC)   Hypotension    Body mass index is 26.83 kg/m.   Dizziness/presyncope: She still feels dizzy.  Continue to hold antihypertensives.  Hypotension: Lasix, carvedilol and Entresto have been held.  Discontinue empagliflozin.  Monitor BP closely off of IV fluids.  Chronic systolic CHF: Compensated.  2D echo in July 2020 showed EF estimated at 20 to 25%.  Hypokalemia: Replete potassium and monitor levels.  Type II DM with hyperglycemia: Continue Levemir and NovoLog.  Monitor glucose levels closely.  Hemoglobin A1c is 13.4.  Glimepiride is on hold.  History of stroke: No evidence of acute stroke on MRI brain.  Continue aspirin and Lipitor  Other comorbidities include hypertension, OSA on CPAP at night     Diet Order             Diet Carb Modified Fluid consistency: Thin; Room service appropriate? Yes  Diet effective now                      Consultants: None  Procedures: None    Medications:    aspirin EC  81 mg Oral Daily   atorvastatin  40 mg Oral Daily   enoxaparin (LOVENOX) injection  40 mg Subcutaneous Q24H   insulin aspart  0-15 Units Subcutaneous TID WC   insulin aspart  0-5 Units Subcutaneous QHS   insulin detemir  17 Units Subcutaneous BID   Continuous Infusions:  sodium chloride        Anti-infectives (From admission, onward)    None              Family Communication/Anticipated D/C date and plan/Code Status   DVT prophylaxis: enoxaparin (LOVENOX) injection 40 mg Start: 10/22/20 2200     Code Status: Full Code  Family Communication: None Disposition Plan:    Status is: Inpatient  Remains inpatient appropriate because:Inpatient level of care appropriate due to severity of illness  Dispo: The patient is from: Home              Anticipated d/c is to: Home              Patient currently is not medically stable to d/c.   Difficult to place patient No           Subjective:   C/o dizziness at rest  Objective:    Vitals:   10/23/20 0500 10/23/20 0758 10/23/20 0925 10/23/20 1224  BP:  95/73 (!) 79/59 95/75  Pulse:  78 84 92  Resp:  16  14  Temp:  (!) 97.4 F (36.3 C)  97.6 F (36.4 C)  TempSrc:      SpO2:  100%  99%  Weight: 64.4 kg     Height:       Orthostatic VS for the past 24  hrs:  BP- Lying Pulse- Lying BP- Sitting Pulse- Sitting BP- Standing at 0 minutes Pulse- Standing at 0 minutes  10/23/20 0912 (!) 79/59 84 (!) 84/65 83 94/69 85     Intake/Output Summary (Last 24 hours) at 10/23/2020 1520 Last data filed at 10/23/2020 1330 Gross per 24 hour  Intake 1415.91 ml  Output --  Net 1415.91 ml   Filed Weights   10/21/20 Jun 05, 2148 10/22/20 0510 10/23/20 0500  Weight: 65.5 kg 63 kg 64.4 kg    Exam:  GEN: NAD SKIN: No rash EYES: EOMI ENT: MMM CV: RRR PULM: CTA B ABD: soft, ND, NT, +BS CNS: AAO x 3, old left facial droop, slurred speech (no left upper teeth) EXT: B/l leg edema (1+), no tenderness        Data Reviewed:   I have personally reviewed following labs and imaging studies:  Labs: Labs show the following:   Basic Metabolic Panel: Recent Labs  Lab 10/21/20 1057 10/22/20 0449 10/23/20 0409  NA 137 141 141  K 3.3* 3.4* 3.2*  CL 96* 101 104  CO2 28 33* 31  GLUCOSE 399* 77 58*  BUN 11 13 14    CREATININE 0.57 0.66 0.70  CALCIUM 8.9 8.7* 8.1*  MG  --   --  1.7   GFR Estimated Creatinine Clearance: 67.4 mL/min (by C-G formula based on SCr of 0.7 mg/dL). Liver Function Tests: Recent Labs  Lab 10/21/20 1057  AST 14*  ALT 12  ALKPHOS 108  BILITOT 1.5*  PROT 6.1*  ALBUMIN 2.8*   No results for input(s): LIPASE, AMYLASE in the last 168 hours. No results for input(s): AMMONIA in the last 168 hours. Coagulation profile No results for input(s): INR, PROTIME in the last 168 hours.  CBC: Recent Labs  Lab 10/21/20 1057 10/23/20 0409  WBC 10.2 9.5  HGB 15.3* 15.3*  HCT 47.1* 48.4*  MCV 101.5* 101.7*  PLT 283 291   Cardiac Enzymes: Recent Labs  Lab 10/21/20 1512  CKTOTAL 34*   BNP (last 3 results) No results for input(s): PROBNP in the last 8760 hours. CBG: Recent Labs  Lab 10/22/20 1615 10/22/20 2054 10/23/20 0745 10/23/20 0800 10/23/20 1226  GLUCAP 97 139* 120* 110* 159*   D-Dimer: No results for input(s): DDIMER in the last 72 hours. Hgb A1c: Recent Labs    10/21/20 2153 10/22/20 0449  HGBA1C 13.4* 13.4*   Lipid Profile: Recent Labs    10/22/20 0449  CHOL 341*  HDL 89  LDLCALC 236*  TRIG 82  CHOLHDL 3.8   Thyroid function studies: Recent Labs    10/21/20 06-06-51  TSH 3.037   Anemia work up: Recent Labs    10/21/20 1512 10/21/20 06/06/2151  VITAMINB12  --  573  FOLATE 13.0  --    Sepsis Labs: Recent Labs  Lab 10/21/20 1057 10/23/20 0409  WBC 10.2 9.5    Microbiology Recent Results (from the past 240 hour(s))  Resp Panel by RT-PCR (Flu A&B, Covid) Nasopharyngeal Swab     Status: None   Collection Time: 10/21/20  1:24 PM   Specimen: Nasopharyngeal Swab; Nasopharyngeal(NP) swabs in vial transport medium  Result Value Ref Range Status   SARS Coronavirus 2 by RT PCR NEGATIVE NEGATIVE Final    Comment: (NOTE) SARS-CoV-2 target nucleic acids are NOT DETECTED.  The SARS-CoV-2 RNA is generally detectable in upper  respiratory specimens during the acute phase of infection. The lowest concentration of SARS-CoV-2 viral copies this assay can detect is 138 copies/mL. A  negative result does not preclude SARS-Cov-2 infection and should not be used as the sole basis for treatment or other patient management decisions. A negative result may occur with  improper specimen collection/handling, submission of specimen other than nasopharyngeal swab, presence of viral mutation(s) within the areas targeted by this assay, and inadequate number of viral copies(<138 copies/mL). A negative result must be combined with clinical observations, patient history, and epidemiological information. The expected result is Negative.  Fact Sheet for Patients:  BloggerCourse.comhttps://www.fda.gov/media/152166/download  Fact Sheet for Healthcare Providers:  SeriousBroker.ithttps://www.fda.gov/media/152162/download  This test is no t yet approved or cleared by the [place] FDA and  has been authorized for detection and/or diagnosis of SARS-CoV-2 by FDA under an Emergency Use Authorization (EUA). This EUA will remain  in effect (meaning this test can be used) for the duration of the COVID-19 declaration under Section 564(b)(1) of the Act, 21 U.S.C.section 360bbb-3(b)(1), unless the authorization is terminated  or revoked sooner.       Influenza A by PCR NEGATIVE NEGATIVE Final   Influenza B by PCR NEGATIVE NEGATIVE Final    Comment: (NOTE) The Xpert Xpress SARS-CoV-2/FLU/RSV plus assay is intended as an aid in the diagnosis of influenza from Nasopharyngeal swab specimens and should not be used as a sole basis for treatment. Nasal washings and aspirates are unacceptable for Xpert Xpress SARS-CoV-2/FLU/RSV testing.  Fact Sheet for Patients: BloggerCourse.comhttps://www.fda.gov/media/152166/download  Fact Sheet for Healthcare Providers: SeriousBroker.ithttps://www.fda.gov/media/152162/download  This test is not yet approved or cleared by the [place] FDA and has been  authorized for detection and/or diagnosis of SARS-CoV-2 by FDA under an Emergency Use Authorization (EUA). This EUA will remain in effect (meaning this test can be used) for the duration of the COVID-19 declaration under Section 564(b)(1) of the Act, 21 U.S.C. section 360bbb-3(b)(1), unless the authorization is terminated or revoked.  Performed at [facility], [address] [place], [place] [address]     Procedures and diagnostic studies:  MR BRAIN WO CONTRAST  Result Date: 10/22/2020 CLINICAL DATA:  Initial evaluation for acute dizziness. EXAM: MRI HEAD WITHOUT CONTRAST TECHNIQUE: Multiplanar, multiecho pulse sequences of the brain and surrounding structures were obtained without intravenous contrast. COMPARISON:  Comparison made with prior CT from earlier the same day. FINDINGS: Brain: Cerebral volume within normal limits. Focus of encephalomalacia and gliosis involving the anterior left frontal lobe consistent with a chronic left MCA distribution infarct. Small remote lacunar infarcts noted at the anterior right basal ganglia and left pons. Additional remote lacunar type infarct noted at the central cerebellum. No abnormal foci of restricted diffusion to suggest acute or subacute ischemia. Gray-white matter differentiation maintained. No other areas of chronic cortical infarction. No foci of susceptibility artifact to suggest acute or chronic intracranial hemorrhage. No mass lesion, midline shift or mass effect. No hydrocephalus or extra-axial fluid collection. Pituitary gland and suprasellar region within normal limits. Midline structures intact. Vascular: Major intracranial vascular flow voids are maintained. Skull and upper cervical spine: Craniocervical junction within normal limits. Bone marrow signal intensity within normal limits. No scalp soft tissue abnormality. Sinuses/Orbits: Globes and orbital soft tissues within normal limits. Paranasal sinuses are clear. No mastoid  effusion. Inner ear structures grossly normal. Other: None. IMPRESSION: 1. No acute intracranial abnormality. 2. Chronic left frontal lobe infarct, with additional remote lacunar infarcts involving the right basal ganglia, left pons, and cerebellum. Electronically Signed   By: Rise MuBenjamin  McClintock M.D.   On: 10/22/2020 00:34   [facility]ot[facility] and [facility])  Result Date: 10/21/2020 CLINICAL DATA:  Transient ischemic attack EXAM: BILATERAL CAROTID DUPLEX ULTRASOUND TECHNIQUE: Wallace Cullens scale imaging, color Doppler and duplex ultrasound were performed of bilateral carotid and vertebral arteries in the neck. COMPARISON:  None. FINDINGS: Criteria: Quantification of carotid stenosis is based on velocity parameters that correlate the residual internal carotid diameter with NASCET-based stenosis levels, using the diameter of the distal internal carotid lumen as the denominator for stenosis measurement. The following velocity measurements were obtained: RIGHT ICA: 53/20 cm/sec CCA: 58/14 cm/sec SYSTOLIC ICA/CCA RATIO:  0.9 ECA: 90 cm/sec LEFT ICA: 55/25 cm/sec CCA: 78/16 cm/sec SYSTOLIC ICA/CCA RATIO:  89 ECA: 0.7 cm/sec RIGHT CAROTID ARTERY: No evidence of hemodynamically significant stenosis. No significant plaque. RIGHT VERTEBRAL ARTERY:  Antegrade LEFT CAROTID ARTERY: No evidence of hemodynamically significant stenosis. No significant plaque. LEFT VERTEBRAL ARTERY:  Antegrade IMPRESSION: No evidence of hemodynamically significant carotid bifurcation stenosis. Electronically Signed   By: Helyn Numbers M.D.   On: 10/21/2020 20:16   ECHOCARDIOGRAM COMPLETE  Result Date: 10/22/2020    ECHOCARDIOGRAM REPORT   Patient Name:   KELIS PLASSE Date of Exam: 10/21/2020 Medical Rec #:  [id number]    Height:       61.0 in Accession #:    [id number]   Weight:       155.0 lb Date of Birth:  1965/09/06    BSA:          1.695 m Patient Age:    55 years     BP:           112/83 mmHg Patient Gender: F            HR:           99  bpm. Exam Location:  [facility] Procedure: 2D Echo, Cardiac Doppler, Color Doppler and Strain Analysis Indications:     CHF-Acute Systolic I50.21  History:         Patient has prior history of Echocardiogram examinations. CHF;                  Risk Factors:Diabetes.  Sonographer:     Neysa Bonito Roar Referring Phys:  [id number] Beulah Gandy A THOMAS Diagnosing Phys: Arnoldo Hooker MD IMPRESSIONS  1. Left ventricular ejection fraction, by estimation, is 20 to 25%. The left ventricle has severely decreased function. The left ventricle demonstrates global hypokinesis. The left ventricular internal cavity size was severely dilated. There is moderate  left ventricular hypertrophy. Left ventricular diastolic parameters were normal.  2. Right ventricular systolic function is normal. The right ventricular size is normal.  3. Left atrial size was mild to moderately dilated.  4. Right atrial size was mild to moderately dilated.  5. The mitral valve is normal in structure. Moderate to severe mitral valve regurgitation.  6. Tricuspid valve regurgitation is moderate.  7. The aortic valve is normal in structure. Aortic valve regurgitation is not visualized. FINDINGS  Left Ventricle: Left ventricular ejection fraction, by estimation, is 20 to 25%. The left ventricle has severely decreased function. The left ventricle demonstrates global hypokinesis. The left ventricular internal cavity size was severely dilated. There is moderate left ventricular hypertrophy. Left ventricular diastolic parameters were normal. Right Ventricle: The right ventricular size is normal. No increase in right ventricular wall thickness. Right ventricular systolic function is normal. Left Atrium: Left atrial size was mild to moderately dilated. Right Atrium: Right atrial size was mild to moderately dilated. Pericardium: There is no evidence of pericardial effusion. Mitral Valve: The mitral valve is  normal in structure. Moderate to severe mitral valve regurgitation.  Tricuspid Valve: The tricuspid valve is normal in structure. Tricuspid valve regurgitation is moderate. Aortic Valve: The aortic valve is normal in structure. Aortic valve regurgitation is not visualized. Aortic valve peak gradient measures 3.6 mmHg. Pulmonic Valve: The pulmonic valve was normal in structure. Pulmonic valve regurgitation is trivial. Aorta: The aortic root and ascending aorta are structurally normal, with no evidence of dilitation. IAS/Shunts: No atrial level shunt detected by color flow Doppler.  LEFT VENTRICLE PLAX 2D LVIDd:         5.90 cm      Diastology LVIDs:         5.50 cm      LV e' medial:    3.59 cm/s LV PW:         0.90 cm      LV E/e' medial:  25.2 LV IVS:        0.80 cm      LV e' lateral:   3.65 cm/s LVOT diam:     1.80 cm      LV E/e' lateral: 24.8 LVOT Area:     2.54 cm  LV Volumes (MOD) LV vol d, MOD A2C: 187.0 ml LV vol d, MOD A4C: 197.0 ml LV vol s, MOD A2C: 148.0 ml LV vol s, MOD A4C: 161.0 ml LV SV MOD A2C:     39.0 ml LV SV MOD A4C:     197.0 ml LV SV MOD BP:      34.1 ml RIGHT VENTRICLE RV Basal diam:  3.70 cm RV Mid diam:    3.20 cm RV S prime:     10.00 cm/s TAPSE (M-mode): 1.8 cm LEFT ATRIUM             Index       RIGHT ATRIUM           Index LA diam:        4.50 cm 2.66 cm/m  RA Area:     15.70 cm LA Vol (A2C):   69.1 ml 40.77 ml/m RA Volume:   41.00 ml  24.19 ml/m LA Vol (A4C):   70.9 ml 41.83 ml/m LA Biplane Vol: 72.6 ml 42.84 ml/m  AORTIC VALVE               PULMONIC VALVE AV Area (Vmax): 1.50 cm   PV Vmax:          0.76 m/s AV Vmax:        95.00 cm/s PV Peak grad:     2.3 mmHg AV Peak Grad:   3.6 mmHg   PR End Diast Vel: 25.00 msec LVOT Vmax:      56.10 cm/s RVOT Peak grad:   1 mmHg  AORTA Ao Asc diam: 2.40 cm MITRAL VALVE               TRICUSPID VALVE MV Area (PHT): 6.37 cm    TR Peak grad:   38.7 mmHg MV Decel Time: 119 msec    TR Vmax:        311.00 cm/s MV E velocity: 90.40 cm/s MV A velocity: 36.40 cm/s  SHUNTS MV E/A ratio:  2.48        Systemic Diam:  1.80 cm MV A Prime:    4.6 cm/s Arnoldo Hooker MD Electronically signed by Arnoldo Hooker MD Signature Date/Time: 10/22/2020/6:24:50 AM    Final  LOS: 2 days   Khori Underberg  [facility]   Pager on [email or web address]. If 7PM-7AM, please contact night-coverage at [email or web address]     10/23/2020, 3:20 PM

## 2020-10-23 NOTE — Evaluation (Signed)
Physical Therapy Evaluation Patient Details Name: Taylor [name] MRN: [id number] DOB: 10-17-1965 Today's Date: 10/23/2020   History of Present Illness  Taylor [name] is a 55yoF who [facility] after dizziness, presyncope 3 days prior while at work. Pt reports persistent weakness, dizziness, denies any CP, diaphoresis. Head CT showing only remote infarcts from 2006. PMH: IDDM2, CKD3, HLD, HTN, CHF followed by heart failure clinic, OSA c CPAP, remote CVA (Lt frontal, Rt BG). MRI study revealing of "Chronic left frontal lobe infarct, with additional remote lacunar infarcts involving the right basal ganglia, left pons, and cerebellum."  Clinical Impression  Pt admitted with above diagnosis. Pt currently with functional limitations due to the deficits listed below (see "PT Problem List"). Upon entry, pt in bed, resting in sidelying and agreeable to participate. The pt is hypoactive, minimally verbal, pleasant, and able to provide info regarding prior level of function, both in tolerance and independence. BP in supine remains quite low compared to her baseline (70s SBP). Orthostatic BP assessment demonstrates gradual increase in SBP with increased gravitational resistance, however HR remains essentially flat. This pattern is often seen in neurogenic OH and dysautonomia, however it is typically seen with concurrent BP drops. Pt c/o of constant "lightheadedness" feelings throughout, "5/10," also reports continuous lightheadedness since yesterday with mild intermittent fluctuation. Pt able to stand for 3 minutes for BP assessment, no increased sway or LOB. Pt tolerates 130ft AMB in hallway and back with supervision, but has very limited confidence in balance and appears generally unsteady in gait. AMB distance limited by author as hypotension problems remains despite corrective action thus far, remains far below pt's norm pressures- I suspect it is effecting the patient more that she is aware (bradykinesia, unsteadiness,  global weakness). Patient's performance this date reveals decreased ability, independence, and tolerance in performing all basic mobility required for performance of activities of daily living. Pt requires additional DME, close physical assistance, and cues for safe participate in mobility. Pt will benefit from skilled PT intervention to increase independence and safety with basic mobility in preparation for discharge to the venue listed below.     Orthostatic VS for the past 24 hrs (Last 3 readings):  BP- Lying Pulse- Lying BP- Sitting Pulse- Sitting BP- Standing  x0 min Pulse- Standing  x0 min BP- Standing  x3 min Pulse- Standing  x3 min  10/23/20 0912 (!) 79/59 84 (!) 84/65 83 94/69 85 96/73 86  10/22/20 1247 -- -- (!) 73/59 79 -- -- -- --  10/22/20 1245 -- -- (!) 82/61 77 (!) 82/64 71 (!) 70/55 78  *AMB pulse 88bpm      Follow Up Recommendations Home health PT;Supervision - Intermittent    Equipment Recommendations  Rolling walker with 5" wheels    Recommendations for Other Services       Precautions / Restrictions Precautions Precautions: Fall Restrictions Weight Bearing Restrictions: No      Mobility  Bed Mobility Overal bed mobility: Modified Independent                  Transfers Overall transfer level: Needs assistance   Transfers: Sit to/from Stand Sit to Stand: Supervision            Ambulation/Gait   Gait Distance (Feet): 100 Feet Assistive device: None   Gait velocity: 0.20m/s Gait velocity interpretation: <1.31 ft/sec, indicative of household ambulator General Gait Details: moderate unsteadiness, low confidence in walking balance, slow, uncertain, reaches for fixed support  Stairs  Wheelchair Mobility    Modified Rankin (Stroke Patients Only)       Balance Overall balance assessment: Needs assistance;Mild deficits observed, not formally tested                                           Pertinent  Vitals/Pain Pain Assessment: No/denies pain    Home Living Family/patient expects to be discharged to:: Private residence Living Arrangements: Spouse/significant other Available Help at Discharge: Family Type of Home: Apartment Home Access: Level entry     Home Layout: One level Home Equipment: None      Prior Function Level of Independence: Independent         Comments: Independent with ADLs, IADLs, and functional mobility (no AD). Pt works and drives. Pt endorses 1 fall 2 months ago     Hand Dominance        Extremity/Trunk Assessment   Upper Extremity Assessment Upper Extremity Assessment: Generalized weakness;Overall [facility] for tasks assessed    Lower Extremity Assessment Lower Extremity Assessment: Generalized weakness;Overall WFL for tasks assessed       Communication   Communication: No difficulties  Cognition Arousal/Alertness: Awake/alert Behavior During Therapy: WFL for tasks assessed/performed Overall Cognitive Status: Within Functional Limits for tasks assessed                                 General Comments: hypoactive, tired seeming without drowsiness      General Comments      Exercises     Assessment/Plan    PT Assessment Patient needs continued PT services  PT Problem List Decreased strength;Decreased activity tolerance;Decreased balance;Decreased mobility;Decreased knowledge of use of DME;Decreased safety awareness;Decreased knowledge of precautions       PT Treatment Interventions DME instruction;Balance training;Gait training;Neuromuscular re-education;Stair training;Functional mobility training;Therapeutic activities;Patient/family education;Therapeutic exercise    PT Goals (Current goals can be found in the Care Plan section)  Acute Rehab PT Goals Patient Stated Goal: to get back to work PT Goal Formulation: With patient Time For Goal Achievement: 11/06/20 Potential to Achieve Goals: Fair    Frequency Min  2X/week   Barriers to discharge        Co-evaluation               AM-PAC PT "6 Clicks" Mobility  Outcome Measure Help needed turning from your back to your side while in a flat bed without using bedrails?: None Help needed moving from lying on your back to sitting on the side of a flat bed without using bedrails?: None Help needed moving to and from a bed to a chair (including a wheelchair)?: None Help needed standing up from a chair using your arms (e.g., wheelchair or bedside chair)?: None Help needed to walk in hospital room?: A Little Help needed climbing 3-5 steps with a railing? : A Little 6 Click Score: 22    End of Session Equipment Utilized During Treatment: Gait belt Activity Tolerance: Treatment limited secondary to medical complications (Comment);Patient limited by fatigue;Other (comment) (BP remains low, pt remains sluggish and lightheaded without exacerbation) Patient left: in chair;with call bell/phone within reach Nurse Communication: Mobility status PT Visit Diagnosis: Unsteadiness on feet (R26.81);Difficulty in walking, not elsewhere classified (R26.2);Other abnormalities of gait and mobility (R26.89);Other symptoms and signs involving the nervous system (R29.898);Dizziness and giddiness (R42)    Time:  2778-2423 PT Time Calculation (min) (ACUTE ONLY): 25 min   Charges:     PT Treatments $Therapeutic Activity: 8-22 mins      9:53 AM, 10/23/20 [name], PT, DPT Physical Therapist - [facility]  [phone] (ASCOM)    Marveline Profeta C 10/23/2020, 9:43 AM

## 2020-10-23 NOTE — Progress Notes (Signed)
Pt refusing CPAP

## 2020-10-23 NOTE — Plan of Care (Signed)
  Problem: Education: Goal: Knowledge of General Education information will improve Description Including pain rating scale, medication(s)/side effects and non-pharmacologic comfort measures Outcome: Progressing   Problem: Health Behavior/Discharge Planning: Goal: Ability to manage health-related needs will improve Outcome: Progressing   

## 2020-10-23 NOTE — Progress Notes (Signed)
   Heart Failure Nurse Navigator Note  Attempted to meet with patient and husband today.  She states that she is tired as she did not sleep well last evening and would like to sleep this afternoon and asked that I come back at another time.  Told her that that would not be a problem and I would stop in and talk with her tomorrow.  Tresa Endo RN [facility]FN

## 2020-10-23 NOTE — Progress Notes (Addendum)
  Patient BMP resulted, and glucose 58.  Per hypoglycemia adult protocol (w/diabetes) provided patient with a sprite.    Patient alert and asymptomatic.  Reports being hungry.    7673 - rechecked patient's BG, now 120.

## 2020-10-24 LAB — GLUCOSE, CAPILLARY
Glucose-Capillary: 176 mg/dL — ABNORMAL HIGH (ref 70–99)
Glucose-Capillary: 183 mg/dL — ABNORMAL HIGH (ref 70–99)
Glucose-Capillary: 197 mg/dL — ABNORMAL HIGH (ref 70–99)
Glucose-Capillary: 221 mg/dL — ABNORMAL HIGH (ref 70–99)
Glucose-Capillary: 46 mg/dL — ABNORMAL LOW (ref 70–99)

## 2020-10-24 LAB — BASIC METABOLIC PANEL
Anion gap: 7 (ref 5–15)
BUN: 17 mg/dL (ref 6–20)
CO2: 29 mmol/L (ref 22–32)
Calcium: 8.2 mg/dL — ABNORMAL LOW (ref 8.9–10.3)
Chloride: 104 mmol/L (ref 98–111)
Creatinine, Ser: 0.64 mg/dL (ref 0.44–1.00)
GFR, Estimated: >60 mL/min (ref >60)
Glucose, Bld: 187 mg/dL — ABNORMAL HIGH (ref 70–99)
Potassium: 4 mmol/L (ref 3.5–5.1)
Sodium: 140 mmol/L (ref 135–145)

## 2020-10-24 LAB — MAGNESIUM: Magnesium: 2 mg/dL (ref 1.7–2.4)

## 2020-10-24 MED ORDER — DEXTROSE 50 % IV SOLN: 12.5000 g | INTRAVENOUS | Status: DC | PRN

## 2020-10-24 MED ORDER — FUROSEMIDE 20 MG PO TABS: 20.0000 mg | ORAL_TABLET | Freq: Every day | ORAL | 3 refills | Status: AC

## 2020-10-24 MED ORDER — DEXTROSE 50 % IV SOLN
INTRAVENOUS | Status: AC
  Administered 2020-10-24: 50 mL
  Filled 2020-10-24: qty 50

## 2020-10-24 MED ORDER — DEXTROSE 50 % IV SOLN: 1.0000 | INTRAVENOUS | Status: DC | PRN

## 2020-10-24 NOTE — Discharge Summary (Signed)
Physician Discharge Summary  Taylor [name] XLK:[id number] DOB: [birth year] DOA: 10/21/2020  PCP: Lauro Regulus, MD  Admit date: 10/21/2020 Discharge date: 10/24/2020  Admitted From: Home Disposition: Home  Recommendations for Outpatient Follow-up:  Follow up with PCP in 1-2 weeks Follow-up with cardiology within a week Please obtain BMP/CBC in one week Please follow up on the following pending results: None  Home Health: Yes Equipment/Devices: None Discharge Condition: Stable CODE STATUS: Full Diet recommendation: Heart Healthy / Carb Modified   Brief/Interim Summary: Taylor [name] is a 55 y.o. female with a history of DM2, CKD stage III, HLD, HTN, CHF, and OSA on chronic CPAP who presented to the ED with complaints of dizziness and a presyncopal spell. Her symptoms were attributed to hypotension from diuretics and antihypertensives.   Lasix, carvedilol and Entresto were held.  She was given IV fluids for hydration.  Symptoms improved.  Orthostatic vitals were negative.  Patient has an history of HFrEF with EF of 20 to 25% since 2017.  Do not see any cardiology work-up in her chart.  She does follow-up irregularly at CHF clinic which she sees a Publishing rights manager.  She was on Lasix, carvedilol and Entresto.. Cardiology was consulted and they do not think that she need any ischemic work-up at this time due to dilated cardiomyopathy, ischemic work-up can be done as an outpatient.  They are advised to restart carvedilol at a very low dose, restart Lasix 20 mg in 3 days and they will follow-up with her in the clinic for further recommendations and management.  Patient also has uncontrolled diabetes mellitus with A1c of 13.4. With her compliance, although stating that she takes her medications.  She needs a close follow-up with PCP for better control of her diabetes.  Home health nurse and PT was also ordered.  She will continue with rest of her home medications except mentioned above and  follow-up with her providers.  She needs to follow-up with cardiology within 1 week for further recommendations.  Discharge Diagnoses:  Principal Problem:   Dizziness Active Problems:   CHF (congestive heart failure) (HCC)   Hypotension   Discharge Instructions  Discharge Instructions     Amb Referral to HF Clinic   Complete by: As directed    Amb Referral to Palliative Care   Complete by: As directed    Diet - low sodium heart healthy   Complete by: As directed    Discharge instructions   Complete by: As directed    It was pleasure taking care of you. We are stopping your Entresto and decreasing the dose of carvedilol as advised by your cardiologist because of your dizziness. We are starting carvedilol at a very low dose of 3.125 mg. You can start taking daily Lasix 20 mg in 3 days. Please follow-up very closely with your cardiologist for further recommendations. You also need to follow-up very closely with your primary care doctor for better control of your diabetes.   Increase activity slowly   Complete by: As directed       Allergies as of 10/24/2020   No Known Allergies      Medication List     STOP taking these medications    carvedilol 25 MG tablet Commonly known as: COREG   Entresto 49-51 MG Generic drug: sacubitril-valsartan   potassium chloride SA 20 MEQ tablet Commonly known as: KLOR-CON       TAKE these medications    aspirin 81 MG EC tablet Take 1 tablet (  81 mg total) by mouth daily.   atorvastatin 40 MG tablet Commonly known as: LIPITOR Take 1 tablet (40 mg total) by mouth daily.   empagliflozin 25 MG Tabs tablet Commonly known as: Jardiance Take 1 tablet (25 mg total) by mouth daily.   furosemide 20 MG tablet Commonly known as: LASIX Take 1 tablet (20 mg total) by mouth daily. Weight gain 2 pounds overnight, 5 pounds in a week, shortness of breath, or leg swelling What changed:  when to take this reasons to take this    glimepiride 2 MG tablet Commonly known as: AMARYL Take 4 mg by mouth 2 (two) times daily.   insulin detemir 100 unit/ml Soln Commonly known as: LEVEMIR Inject 17 Units into the skin 2 (two) times daily.   metFORMIN 1000 MG tablet Commonly known as: GLUCOPHAGE Take 1 tablet (1,000 mg total) 2 (two) times daily with a meal by mouth.        Follow-up Information     Lauro Regulus, MD. Schedule an appointment as soon as possible for a visit in 1 week(s).   Specialty: Internal Medicine Contact information: [address] [facility] [place] [address] [phone]         Armando Reichert, MD. Schedule an appointment as soon as possible for a visit in 1 week(s).   Specialty: Cardiology Contact information: [address] [place] [address] [phone]                No Known Allergies  Consultations: Cardiology  Procedures/Studies: CT HEAD WO CONTRAST  Result Date: 10/21/2020 CLINICAL DATA:  Nonspecific dizziness EXAM: CT HEAD WITHOUT CONTRAST TECHNIQUE: Contiguous axial images were obtained from the base of the skull through the vertex without intravenous contrast. COMPARISON:  11/08/2004 FINDINGS: Brain: Cortical and subcortical CSF density intervally seen along the anterior left frontal lobe. Chronic infarct with volume loss at the right caudate head. No visible acute infarct, hemorrhage, hydrocephalus, or mass effect. Vascular: No hyperdense vessel or unexpected calcification. Skull: Normal. Negative for fracture or focal lesion. Sinuses/Orbits: No acute finding. IMPRESSION: 1. 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 2. No acute finding Electronically Signed   By: Marnee Spring M.D.   On: 10/21/2020 11:43   MR BRAIN WO CONTRAST  Result Date: 10/22/2020 CLINICAL DATA:  Initial evaluation for acute dizziness. EXAM: MRI HEAD WITHOUT CONTRAST TECHNIQUE: Multiplanar, multiecho  pulse sequences of the brain and surrounding structures were obtained without intravenous contrast. COMPARISON:  Comparison made with prior CT from earlier the same day. FINDINGS: Brain: Cerebral volume within normal limits. Focus of encephalomalacia and gliosis involving the anterior left frontal lobe consistent with a chronic left MCA distribution infarct. Small remote lacunar infarcts noted at the anterior right basal ganglia and left pons. Additional remote lacunar type infarct noted at the central cerebellum. No abnormal foci of restricted diffusion to suggest acute or subacute ischemia. Gray-white matter differentiation maintained. No other areas of chronic cortical infarction. No foci of susceptibility artifact to suggest acute or chronic intracranial hemorrhage. No mass lesion, midline shift or mass effect. No hydrocephalus or extra-axial fluid collection. Pituitary gland and suprasellar region within normal limits. Midline structures intact. Vascular: Major intracranial vascular flow voids are maintained. Skull and upper cervical spine: Craniocervical junction within normal limits. Bone marrow signal intensity within normal limits. No scalp soft tissue abnormality. Sinuses/Orbits: Globes and orbital soft tissues within normal limits. Paranasal sinuses are clear. No  mastoid effusion. Inner ear structures grossly normal. Other: None. IMPRESSION: 1. No acute intracranial abnormality. 2. Chronic left frontal lobe infarct, with additional remote lacunar infarcts involving the right basal ganglia, left pons, and cerebellum. Electronically Signed   By: Rise MuBenjamin  McClintock M.D.   On: 10/22/2020 00:34   US Carotid Bilateral [facility] and [facility] only)  Result Date: 10/21/2020 CLINICAL DATA:  Transient ischemic attack EXAM: BILATERAL CAROTID DUPLEX ULTRASOUND TECHNIQUE: Wallace CullensGray scale imaging, color Doppler and duplex ultrasound were performed of bilateral carotid and vertebral arteries in the neck. COMPARISON:  None.  FINDINGS: Criteria: Quantification of carotid stenosis is based on velocity parameters that correlate the residual internal carotid diameter with NASCET-based stenosis levels, using the diameter of the distal internal carotid lumen as the denominator for stenosis measurement. The following velocity measurements were obtained: RIGHT ICA: 53/20 cm/sec CCA: 58/14 cm/sec SYSTOLIC ICA/CCA RATIO:  0.9 ECA: 90 cm/sec LEFT ICA: 55/25 cm/sec CCA: 78/16 cm/sec SYSTOLIC ICA/CCA RATIO:  89 ECA: 0.7 cm/sec RIGHT CAROTID ARTERY: No evidence of hemodynamically significant stenosis. No significant plaque. RIGHT VERTEBRAL ARTERY:  Antegrade LEFT CAROTID ARTERY: No evidence of hemodynamically significant stenosis. No significant plaque. LEFT VERTEBRAL ARTERY:  Antegrade IMPRESSION: No evidence of hemodynamically significant carotid bifurcation stenosis. Electronically Signed   By: Helyn NumbersAshesh  Parikh M.D.   On: 10/21/2020 20:16   DG Chest Portable 1 View  Result Date: 10/21/2020 CLINICAL DATA:  Weakness and dizziness. EXAM: PORTABLE CHEST 1 VIEW COMPARISON:  08/31/2018 FINDINGS: Mild cardiac enlargement. Mild diffuse pulmonary interstitial edema present, similar to the prior study. No significant pleural effusions. No pulmonary consolidation or pneumothorax identified. IMPRESSION: Mild diffuse pulmonary interstitial edema. Electronically Signed   By: Irish LackGlenn  Yamagata M.D.   On: 10/21/2020 13:20   ECHOCARDIOGRAM COMPLETE  Result Date: 10/22/2020    ECHOCARDIOGRAM REPORT   Patient Name:   Eden EmmsLORI A Holloman Date of Exam: 10/21/2020 Medical Rec #:  [id number]    Height:       61.0 in Accession #:    [id number]   Weight:       155.0 lb Date of Birth:  05/10/1965    BSA:          1.695 m Patient Age:    55 years     BP:           112/83 mmHg Patient Gender: F            HR:           99 bpm. Exam Location:  [facility] Procedure: 2D Echo, Cardiac Doppler, Color Doppler and Strain Analysis Indications:     CHF-Acute Systolic I50.21  History:          Patient has prior history of Echocardiogram examinations. CHF;                  Risk Factors:Diabetes.  Sonographer:     Neysa Bonitohristy Roar Referring Phys:  [id number] Beulah GandySARA-MAIZ A THOMAS Diagnosing Phys: Arnoldo HookerBruce Kowalski MD IMPRESSIONS  1. Left ventricular ejection fraction, by estimation, is 20 to 25%. The left ventricle has severely decreased function. The left ventricle demonstrates global hypokinesis. The left ventricular internal cavity size was severely dilated. There is moderate  left ventricular hypertrophy. Left ventricular diastolic parameters were normal.  2. Right ventricular systolic function is normal. The right ventricular size is normal.  3. Left atrial size was mild to moderately dilated.  4. Right atrial size was mild to moderately dilated.  5. The mitral valve is normal in structure.  Moderate to severe mitral valve regurgitation.  6. Tricuspid valve regurgitation is moderate.  7. The aortic valve is normal in structure. Aortic valve regurgitation is not visualized. FINDINGS  Left Ventricle: Left ventricular ejection fraction, by estimation, is 20 to 25%. The left ventricle has severely decreased function. The left ventricle demonstrates global hypokinesis. The left ventricular internal cavity size was severely dilated. There is moderate left ventricular hypertrophy. Left ventricular diastolic parameters were normal. Right Ventricle: The right ventricular size is normal. No increase in right ventricular wall thickness. Right ventricular systolic function is normal. Left Atrium: Left atrial size was mild to moderately dilated. Right Atrium: Right atrial size was mild to moderately dilated. Pericardium: There is no evidence of pericardial effusion. Mitral Valve: The mitral valve is normal in structure. Moderate to severe mitral valve regurgitation. Tricuspid Valve: The tricuspid valve is normal in structure. Tricuspid valve regurgitation is moderate. Aortic Valve: The aortic valve is normal in structure. Aortic  valve regurgitation is not visualized. Aortic valve peak gradient measures 3.6 mmHg. Pulmonic Valve: The pulmonic valve was normal in structure. Pulmonic valve regurgitation is trivial. Aorta: The aortic root and ascending aorta are structurally normal, with no evidence of dilitation. IAS/Shunts: No atrial level shunt detected by color flow Doppler.  LEFT VENTRICLE PLAX 2D LVIDd:         5.90 cm      Diastology LVIDs:         5.50 cm      LV e' medial:    3.59 cm/s LV PW:         0.90 cm      LV E/e' medial:  25.2 LV IVS:        0.80 cm      LV e' lateral:   3.65 cm/s LVOT diam:     1.80 cm      LV E/e' lateral: 24.8 LVOT Area:     2.54 cm  LV Volumes (MOD) LV vol d, MOD A2C: 187.0 ml LV vol d, MOD A4C: 197.0 ml LV vol s, MOD A2C: 148.0 ml LV vol s, MOD A4C: 161.0 ml LV SV MOD A2C:     39.0 ml LV SV MOD A4C:     197.0 ml LV SV MOD BP:      34.1 ml RIGHT VENTRICLE RV Basal diam:  3.70 cm RV Mid diam:    3.20 cm RV S prime:     10.00 cm/s TAPSE (M-mode): 1.8 cm LEFT ATRIUM             Index       RIGHT ATRIUM           Index LA diam:        4.50 cm 2.66 cm/m  RA Area:     15.70 cm LA Vol (A2C):   69.1 ml 40.77 ml/m RA Volume:   41.00 ml  24.19 ml/m LA Vol (A4C):   70.9 ml 41.83 ml/m LA Biplane Vol: 72.6 ml 42.84 ml/m  AORTIC VALVE               PULMONIC VALVE AV Area (Vmax): 1.50 cm   PV Vmax:          0.76 m/s AV Vmax:        95.00 cm/s PV Peak grad:     2.3 mmHg AV Peak Grad:   3.6 mmHg   PR End Diast Vel: 25.00 msec LVOT Vmax:      56.10 cm/s RVOT Peak grad:  1 mmHg  AORTA Ao Asc diam: 2.40 cm MITRAL VALVE               TRICUSPID VALVE MV Area (PHT): 6.37 cm    TR Peak grad:   38.7 mmHg MV Decel Time: 119 msec    TR Vmax:        311.00 cm/s MV E velocity: 90.40 cm/s MV A velocity: 36.40 cm/s  SHUNTS MV E/A ratio:  2.48        Systemic Diam: 1.80 cm MV A Prime:    4.6 cm/s Arnoldo Hooker MD Electronically signed by Arnoldo Hooker MD Signature Date/Time: 10/22/2020/6:24:50 AM    Final      Subjective: Patient was seen and examined today.  Feeling improved, continue to feel little weak with some shortness of breath which seems chronic.  Patient does not have a cardiologist, just follow-up with CHF clinic irregularly.  No prior cardiac work-up seen in the chart.  When discussed with patient she states that she just sees her doctor who manages her diabetes.  She notes that she has a weaker heart for many years but could not explain anything else.  Discharge Exam: Vitals:   10/24/20 0836 10/24/20 1143  BP: (!) 129/96 101/74  Pulse: 97 96  Resp: 18 17  Temp: 98.7 F (37.1 C) 98.7 F (37.1 C)  SpO2: 92% 100%   Vitals:   10/24/20 0417 10/24/20 0836 10/24/20 0959 10/24/20 1143  BP: 91/65 (!) 129/96  101/74  Pulse: 89 97  96  Resp: Temp: (!) 97.5 F (36.4 C) 98.7 F (37.1 C)  98.7 F (37.1 C)  TempSrc:      SpO2: 100% 92%  100%  Weight:   65.4 kg   Height:        General: Pt is alert, awake, not in acute distress Cardiovascular: RRR, S1/S2 +, no rubs, no gallops Respiratory: CTA bilaterally, no wheezing, no rhonchi Abdominal: Soft, NT, ND, bowel sounds + Extremities: 1+ LE edema, no cyanosis   The results of significant diagnostics from this hospitalization (including imaging, microbiology, ancillary and laboratory) are listed below for reference.    Microbiology: Recent Results (from the past 240 hour(s))  Resp Panel by RT-PCR (Flu A&B, Covid) Nasopharyngeal Swab     Status: None   Collection Time: 10/21/20  1:24 PM   Specimen: Nasopharyngeal Swab; Nasopharyngeal(NP) swabs in vial transport medium  Result Value Ref Range Status   SARS Coronavirus 2 by RT PCR NEGATIVE NEGATIVE Final    Comment: (NOTE) SARS-CoV-2 target nucleic acids are NOT DETECTED.  The SARS-CoV-2 RNA is generally detectable in upper respiratory specimens during the acute phase of infection. The lowest concentration of SARS-CoV-2 viral copies this assay can detect is 138  copies/mL. A negative result does not preclude SARS-Cov-2 infection and should not be used as the sole basis for treatment or other patient management decisions. A negative result may occur with  improper specimen collection/handling, submission of specimen other than nasopharyngeal swab, presence of viral mutation(s) within the areas targeted by this assay, and inadequate number of viral copies(<138 copies/mL). A negative result must be combined with clinical observations, patient history, and epidemiological information. The expected result is Negative.  Fact Sheet for Patients:  BloggerCourse.com  Fact Sheet for Healthcare Providers:  SeriousBroker.it  This test is no t yet approved or cleared by the [place] FDA and  has been authorized for detection and/or diagnosis of SARS-CoV-2 by FDA under an  Emergency Use Authorization (EUA). This EUA will remain  in effect (meaning this test can be used) for the duration of the COVID-19 declaration under Section 564(b)(1) of the Act, 21 U.S.C.section 360bbb-3(b)(1), unless the authorization is terminated  or revoked sooner.       Influenza A by PCR NEGATIVE NEGATIVE Final   Influenza B by PCR NEGATIVE NEGATIVE Final    Comment: (NOTE) The Xpert Xpress SARS-CoV-2/FLU/RSV plus assay is intended as an aid in the diagnosis of influenza from Nasopharyngeal swab specimens and should not be used as a sole basis for treatment. Nasal washings and aspirates are unacceptable for Xpert Xpress SARS-CoV-2/FLU/RSV testing.  Fact Sheet for Patients: BloggerCourse.com  Fact Sheet for Healthcare Providers: SeriousBroker.it  This test is not yet approved or cleared by the [place] FDA and has been authorized for detection and/or diagnosis of SARS-CoV-2 by FDA under an Emergency Use Authorization (EUA). This EUA will remain in effect (meaning  this test can be used) for the duration of the COVID-19 declaration under Section 564(b)(1) of the Act, 21 U.S.C. section 360bbb-3(b)(1), unless the authorization is terminated or revoked.  Performed at [facility], [address] [place], [place] [address]      Labs: BNP (last 3 results) Recent Labs    10/21/20 1057  BNP 1,597.6*   Basic Metabolic Panel: Recent Labs  Lab 10/21/20 1057 10/22/20 0449 10/23/20 0409 10/24/20 0434  NA 137 141 141 140  K 3.3* 3.4* 3.2* 4.0  CL 96* 101 104 104  CO2 28 33* 31 29  GLUCOSE 399* 77 58* 187*  BUN 11 13 14 17   CREATININE 0.57 0.66 0.70 0.64  CALCIUM 8.9 8.7* 8.1* 8.2*  MG  --   --  1.7 2.0   Liver Function Tests: Recent Labs  Lab 10/21/20 1057  AST 14*  ALT 12  ALKPHOS 108  BILITOT 1.5*  PROT 6.1*  ALBUMIN 2.8*   No results for input(s): LIPASE, AMYLASE in the last 168 hours. No results for input(s): AMMONIA in the last 168 hours. CBC: Recent Labs  Lab 10/21/20 1057 10/23/20 0409  WBC 10.2 9.5  HGB 15.3* 15.3*  HCT 47.1* 48.4*  MCV 101.5* 101.7*  PLT 283 291   Cardiac Enzymes: Recent Labs  Lab 10/21/20 1512  CKTOTAL 34*   BNP: Invalid input(s): POCBNP CBG: Recent Labs  Lab 10/24/20 0028 10/24/20 0104 10/24/20 0419 10/24/20 0837 10/24/20 1141  GLUCAP 46* 176* 197* 221* 183*   D-Dimer No results for input(s): DDIMER in the last 72 hours. Hgb A1c Recent Labs    10/21/20 2153 10/22/20 0449  HGBA1C 13.4* 13.4*   Lipid Profile Recent Labs    10/22/20 0449  CHOL 341*  HDL 89  LDLCALC 236*  TRIG 82  CHOLHDL 3.8   Thyroid function studies Recent Labs    10/21/20 2153  TSH 3.037   Anemia work up Recent Labs    10/21/20 2153  VITAMINB12 573   Urinalysis    Component Value Date/Time   COLORURINE YELLOW 10/21/2020 1057   APPEARANCEUR HAZY (A) 10/21/2020 1057   LABSPEC 1.020 10/21/2020 1057   PHURINE 5.5 10/21/2020 1057   GLUCOSEU >1,000 (A) 10/21/2020 1057   HGBUR  MODERATE (A) 10/21/2020 1057   BILIRUBINUR SMALL (A) 10/21/2020 1057   KETONESUR 15 (A) 10/21/2020 1057   PROTEINUR >300 (A) 10/21/2020 1057   NITRITE NEGATIVE 10/21/2020 1057   LEUKOCYTESUR NEGATIVE 10/21/2020 1057   Sepsis Labs Invalid input(s): PROCALCITONIN,  WBC,  LACTICIDVEN  Microbiology Recent Results (from the past 240 hour(s))  Resp Panel by RT-PCR (Flu A&B, Covid) Nasopharyngeal Swab     Status: None   Collection Time: 10/21/20  1:24 PM   Specimen: Nasopharyngeal Swab; Nasopharyngeal(NP) swabs in vial transport medium  Result Value Ref Range Status   SARS Coronavirus 2 by RT PCR NEGATIVE NEGATIVE Final    Comment: (NOTE) SARS-CoV-2 target nucleic acids are NOT DETECTED.  The SARS-CoV-2 RNA is generally detectable in upper respiratory specimens during the acute phase of infection. The lowest concentration of SARS-CoV-2 viral copies this assay can detect is 138 copies/mL. A negative result does not preclude SARS-Cov-2 infection and should not be used as the sole basis for treatment or other patient management decisions. A negative result may occur with  improper specimen collection/handling, submission of specimen other than nasopharyngeal swab, presence of viral mutation(s) within the areas targeted by this assay, and inadequate number of viral copies(<138 copies/mL). A negative result must be combined with clinical observations, patient history, and epidemiological information. The expected result is Negative.  Fact Sheet for Patients:  BloggerCourse.com  Fact Sheet for Healthcare Providers:  SeriousBroker.it  This test is no t yet approved or cleared by the [place] FDA and  has been authorized for detection and/or diagnosis of SARS-CoV-2 by FDA under an Emergency Use Authorization (EUA). This EUA will remain  in effect (meaning this test can be used) for the duration of the COVID-19 declaration under Section  564(b)(1) of the Act, 21 U.S.C.section 360bbb-3(b)(1), unless the authorization is terminated  or revoked sooner.       Influenza A by PCR NEGATIVE NEGATIVE Final   Influenza B by PCR NEGATIVE NEGATIVE Final    Comment: (NOTE) The Xpert Xpress SARS-CoV-2/FLU/RSV plus assay is intended as an aid in the diagnosis of influenza from Nasopharyngeal swab specimens and should not be used as a sole basis for treatment. Nasal washings and aspirates are unacceptable for Xpert Xpress SARS-CoV-2/FLU/RSV testing.  Fact Sheet for Patients: BloggerCourse.com  Fact Sheet for Healthcare Providers: SeriousBroker.it  This test is not yet approved or cleared by the [place] FDA and has been authorized for detection and/or diagnosis of SARS-CoV-2 by FDA under an Emergency Use Authorization (EUA). This EUA will remain in effect (meaning this test can be used) for the duration of the COVID-19 declaration under Section 564(b)(1) of the Act, 21 U.S.C. section 360bbb-3(b)(1), unless the authorization is terminated or revoked.  Performed at [facility], [address] [place], [place] [address]     Time coordinating discharge: Over 30 minutes  SIGNED:  Arnetha Courser, MD  [facility] 10/24/2020, 3:18 PM  If 7PM-7AM, please contact night-coverage [email or web address]  This record has been created using Conservation officer, historic buildings. Errors have been sought and corrected,but may not always be located. Such creation errors do not reflect on the standard of care.

## 2020-10-24 NOTE — Clinical Social Work Note (Signed)
Occupational Therapy * Physical Therapy * Speech Therapy                      DATE __9/7/22___________________________  PATIENT NAME__[name]___________________ PATIENT MRN___[id number]_________________ DIAGNOSIS/DIAGNOSIS CODE __ Hypotension I95.9   DATE OF DISCHARGE: __9/7/22____________ PRIMARY CARE PHYSICIAN __Marshall Anderson__________     PCP PHONE/FAX_____[phone]____________________    Dear Provider (Name: _________________________   Fax: _______________________):   I certify that I have examined this patient and that occupational/physical/speech therapy is necessary on an outpatient basis.    The patient has expressed interest in completing their recommended course of therapy at your  location.  Once a formal order from the patient's primary care physician has been obtained, please  contact him/her to schedule an appointment for evaluation at your earliest convenience.   [ * ]  Physical Therapy Evaluate and Treat          [  ]  Occupational Therapy Evaluate and Treat                       [  ]  Speech Therapy Evaluate and Treat         The patient's primary care physician (listed above) must furnish and be responsible for a formal order such that the recommended services may be furnished while under the primary physician's care, and that the plan of care will be established and reviewed every 30 days (or more often if condition necessitates).  MD electric signature below

## 2020-10-24 NOTE — Consult Note (Signed)
   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  Moderate LVH.  Moderate to severe mitral regurgitation.  Moderate tricuspid regurgitation.  She presented to the emergency room with complaints of dizziness,weakness and lower extremity edema.   Comorbidities:  Chronic kidney disease stage III Hypertension Hyperlipidemia Obstructive sleep apnea with CPAP use.  Labs:  Sodium 140, potassium 4.0, chloride 104, CO2 29, BUN 17, creatinine 0.64 Intake 720 mL Output 400 mL Weight 65.4, weight on 9 5 with 64.4 kg Blood pressure 101/74  Medications:  Aspirin 81 mg daily Atorvastatin 40 mg daily   Home meds include Coreg 25 mg twice a day, Entresto 49/51 twice a day and Lasix 20 mg as needed per weight gain instructions.   Met with patient today she was awake and alert sitting up in the chair at bedside.  Discussed low-sodium diet and fluid restriction.  She states that most of her fluid intake is water and tries to keep it under 64 ounces daily.  She states that she weighs herself daily and had not noticed a weight gain prior to admission.  She goes on to describe her job for which she is working in [employer] that it is very strenuous and stressful.  She has an appointment to see Darylene Price in the outpatient heart failure clinic on September 12 at 2 PM.  She was giving the heart failure folder which contained living with heart failure teaching booklet, zone magnet, information on low-sodium diet and taking charge of your heart failure.  Will continue to follow.  Pricilla Riffle RN CHFN

## 2020-10-24 NOTE — TOC Initial Note (Signed)
Transition of Care [facility]) - Initial/Assessment Note    Patient Details  Name: Taylor [name] MRN: [id number] Date of Birth: [birth year]  [facility]) CM/SW Contact:    Maree Krabbe, LCSW Phone Number: 10/24/2020, 3:48 PM  Clinical Narrative:   CSW spoke with pt and although pt is agreeable to [facility] CSW unable to find a agency to service pt due to [employer]. Pt is agreeable to outpatient PT. Pt prefers clinic here at hospital. Pt is agreeable to rolling walker. Pt lives with significant other who will assist her following dc.                Expected Discharge Plan: Home/Self Care Barriers to Discharge: No Barriers Identified   Patient Goals and CMS Choice Patient states their goals for this hospitalization and ongoing recovery are:: to go home   Choice offered to / list presented to : Patient  Expected Discharge Plan and Services Expected Discharge Plan: Home/Self Care In-house Referral: Clinical Social Work   Post Acute Care Choice: NA Living arrangements for the past 2 months: Single Family Home Expected Discharge Date: 10/24/20                                    Prior Living Arrangements/Services Living arrangements for the past 2 months: Single Family Home Lives with:: Significant Other Patient language and need for interpreter reviewed:: Yes Do you feel safe going back to the place where you live?: Yes      Need for Family Participation in Patient Care: Yes (Comment) Care giver support system in place?: Yes (comment)   Criminal Activity/Legal Involvement Pertinent to Current Situation/Hospitalization: No - Comment as needed  Activities of Daily Living Home Assistive Devices/Equipment: None ADL Screening (condition at time of admission) Patient's cognitive ability adequate to safely complete daily activities?: Yes Is the patient deaf or have difficulty hearing?: No Does the patient have difficulty seeing, even when wearing glasses/contacts?:  No Does the patient have difficulty concentrating, remembering, or making decisions?: No Patient able to express need for assistance with ADLs?: Yes Does the patient have difficulty dressing or bathing?: No Independently performs ADLs?: Yes (appropriate for developmental age) Does the patient have difficulty walking or climbing stairs?: No Weakness of Legs: None Weakness of Arms/Hands: None  Permission Sought/Granted                  Emotional Assessment Appearance:: Appears stated age Attitude/Demeanor/Rapport: Engaged Affect (typically observed): Accepting Orientation: : Oriented to Situation, Oriented to  Time, Oriented to Place, Oriented to Self Alcohol / Substance Use: Not Applicable Psych Involvement: No (comment)  Admission diagnosis:  TIA (transient ischemic attack) [G45.9] Dizziness [R42] CHF (congestive heart failure) (HCC) [I50.9] Obstructive sleep apnea [G47.33] Acute on chronic systolic CHF (congestive heart failure) (HCC) [I50.23] Patient Active Problem List   Diagnosis Date Noted   Dizziness 10/23/2020   Hypotension 10/23/2020   CHF (congestive heart failure) (HCC) 10/21/2020   Sepsis (HCC) 08/31/2018   CAP (community acquired pneumonia) 08/31/2018   Acute respiratory failure with hypoxia (HCC) 08/31/2018   Obstructive sleep apnea 09/03/2016   Diabetes (HCC) 09/14/2015   Tachycardia 09/14/2015   Acute on chronic systolic CHF (congestive heart failure) (HCC) 08/30/2015   PCP:  Lauro Regulus, MD Pharmacy:   [facility] [address] [place], [place] - [address] [place] [address] Phone: [phone] Fax: [phone]  Social Determinants of Health (SDOH) Interventions    Readmission Risk Interventions No flowsheet data found.   

## 2020-10-24 NOTE — Consult Note (Signed)
[facility]  CARDIOLOGY CONSULT NOTE  Patient ID: Eden EmmsLori A Olheiser MRN: [id number] DOB/AGE: 56-May-1967 56 y.o.  Admit date: 10/21/2020 Referring Physician Arnetha CourserAmin Sumayya Primary Physician Lauro RegulusAnderson, Marshall W, MD Primary Cardiologist None Reason for Consultation HFrEF, dizziness  HPI:  Hart RochesterLori Pettibone is a  55 yo F with h/o T2DM, CKD3, HTN, HLD, HFrEF (20%) who is admitted with presyncope and dizziness thought to be related to diuretics and antihypertensives.   The patient follows with Clarisa Kindredina Hackney in the heart failure clinic, most recently seen in November 2021.  She is a longstanding history of heart failure with reduced ejection fraction with an EF of 20% dating back to 2017.  She presented to the emergency department on 9 4 with complaints of presyncope 3 days previously while she was at work.  She said that she suddenly started feeling dizzy and presyncopal on Friday, and eventually was able to rest and have her symptoms improve over the next several minutes.  The following 2 days she had persistent symptoms with a mild headache as well as nausea and vomiting. Since being in the hospital she feels much better than when she was admitted to the hospital. She is able to ambulate to the bathroom with no difficulty.    ROS is notable for chronic LE edema, chronic orthopnea (2 pillows), PND. No chest pain.    Social - Works in Pharmacologistlaundry room at [employer]  Review of systems complete and found to be negative unless listed above     Past Medical History:  Diagnosis Date   CHF (congestive heart failure) (HCC)    Diabetes mellitus without complication (HCC)    Obstructive sleep apnea     Past Surgical History:  Procedure Laterality Date   ABDOMINAL HYSTERECTOMY      Medications Prior to Admission  Medication Sig Dispense Refill Last Dose   aspirin EC 81 MG EC tablet Take 1 tablet (81 mg total) by mouth daily.   10/21/2020 at 0800   atorvastatin (LIPITOR) 40 MG tablet Take 1 tablet (40 mg  total) by mouth daily. 90 tablet 3 10/20/2020 at 1800   carvedilol (COREG) 25 MG tablet TAKE 1 TABLET BY MOUTH TWICE A DAY (Patient taking differently: Take 25 mg by mouth 2 (two) times daily with a meal.) 180 tablet 3 10/21/2020 at 0800   empagliflozin (JARDIANCE) 25 MG TABS tablet Take 1 tablet (25 mg total) by mouth daily. 90 tablet 3 10/21/2020 at 0800   ENTRESTO 49-51 MG TAKE 1 TABLET BY MOUTH TWICE A DAY (Patient taking differently: Take 1 tablet by mouth 2 (two) times daily.) 60 tablet 0 10/21/2020 at 0800   furosemide (LASIX) 20 MG tablet Take 1 tablet (20 mg total) by mouth as needed. Weight gain 2 pounds overnight, 5 pounds in a week, shortness of breath, or leg swelling 90 tablet 3 prn at prn   glimepiride (AMARYL) 2 MG tablet Take 4 mg by mouth 2 (two) times daily.   10/21/2020 at 0800   insulin detemir (LEVEMIR) 100 unit/ml SOLN Inject 17 Units into the skin 2 (two) times daily.    10/20/2020 at 1800   metFORMIN (GLUCOPHAGE) 1000 MG tablet Take 1 tablet (1,000 mg total) 2 (two) times daily with a meal by mouth. 180 tablet 3 10/21/2020 at 0800   potassium chloride SA (K-DUR) 20 MEQ tablet Take 1 tablet (20 mEq total) by mouth daily. Take 2 tablets daily for today and tomorrow only and then take 1 tablet daily (Patient taking  differently: Take 20 mEq by mouth once a week. On Mondays.) 92 tablet 3 Past Week   Social History   Socioeconomic History   Marital status: Single    Spouse name: Not on file   Number of children: Not on file   Years of education: Not on file   Highest education level: Not on file  Occupational History   Not on file  Tobacco Use   Smoking status: Former   Smokeless tobacco: Never  Substance and Sexual Activity   Alcohol use: No    Comment: occ   Drug use: No   Sexual activity: Not Currently  Other Topics Concern   Not on file  Social History Narrative   Not on file   Social Determinants of Health   Financial Resource Strain: Not on file  Food Insecurity: Not on  file  Transportation Needs: Not on file  Physical Activity: Not on file  Stress: Not on file  Social Connections: Not on file  Intimate Partner Violence: Not on file    Family History  Problem Relation Age of Onset   Anemia Neg Hx    Arrhythmia Neg Hx    Asthma Neg Hx    Clotting disorder Neg Hx    Fainting Neg Hx    Heart attack Neg Hx    Heart disease Neg Hx    Heart failure Neg Hx    Hyperlipidemia Neg Hx    Hypertension Neg Hx       Review of systems complete and found to be negative unless listed above      PHYSICAL EXAM  General: Well developed, well nourished, in no acute distress HEENT:  Normocephalic and atramatic Neck:  No JVD.  Lungs: Clear bilaterally to auscultation and percussion. Heart: HRRR . Normal S1 and S2 without gallops or murmurs.  Abdomen: Bowel sounds are positive, abdomen soft and non-tender  Msk:  Back normal, normal gait. Normal strength and tone for age. Extremities: No clubbing, cyanosis or edema.   Neuro: Alert and oriented X 3. Psych:  Good affect, responds appropriately  Labs:   Lab Results  Component Value Date   WBC 9.5 10/23/2020   HGB 15.3 (H) 10/23/2020   HCT 48.4 (H) 10/23/2020   MCV 101.7 (H) 10/23/2020   PLT 291 10/23/2020    Recent Labs  Lab 10/21/20 1057 10/22/20 0449 10/24/20 0434  NA 137   < > 140  K 3.3*   < > 4.0  CL 96*   < > 104  CO2 28   < > 29  BUN 11   < > 17  CREATININE 0.57   < > 0.64  CALCIUM 8.9   < > 8.2*  PROT 6.1*  --   --   BILITOT 1.5*  --   --   ALKPHOS 108  --   --   ALT 12  --   --   AST 14*  --   --   GLUCOSE 399*   < > 187*   < > = values in this interval not displayed.   Lab Results  Component Value Date   CKTOTAL 34 (L) 10/21/2020   TROPONINI 0.04 (HH) 08/30/2015    Lab Results  Component Value Date   CHOL 341 (H) 10/22/2020   Lab Results  Component Value Date   HDL 89 10/22/2020   Lab Results  Component Value Date   LDLCALC 236 (H) 10/22/2020   Lab Results   Component Value Date  TRIG 82 10/22/2020   Lab Results  Component Value Date   CHOLHDL 3.8 10/22/2020   No results found for: LDLDIRECT    Radiology: CT HEAD WO CONTRAST  Result Date: 10/21/2020 CLINICAL DATA:  Nonspecific dizziness EXAM: CT HEAD WITHOUT CONTRAST TECHNIQUE: Contiguous axial images were obtained from the base of the skull through the vertex without intravenous contrast. COMPARISON:  11/08/2004 FINDINGS: Brain: Cortical and subcortical CSF density intervally seen along the anterior left frontal lobe. Chronic infarct with volume loss at the right caudate head. No visible acute infarct, hemorrhage, hydrocephalus, or mass effect. Vascular: No hyperdense vessel or unexpected calcification. Skull: Normal. Negative for fracture or focal lesion. Sinuses/Orbits: No acute finding. IMPRESSION: 1. 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 2. No acute finding Electronically Signed   By: Marnee Spring M.D.   On: 10/21/2020 11:43   MR BRAIN WO CONTRAST  Result Date: 10/22/2020 CLINICAL DATA:  Initial evaluation for acute dizziness. EXAM: MRI HEAD WITHOUT CONTRAST TECHNIQUE: Multiplanar, multiecho pulse sequences of the brain and surrounding structures were obtained without intravenous contrast. COMPARISON:  Comparison made with prior CT from earlier the same day. FINDINGS: Brain: Cerebral volume within normal limits. Focus of encephalomalacia and gliosis involving the anterior left frontal lobe consistent with a chronic left MCA distribution infarct. Small remote lacunar infarcts noted at the anterior right basal ganglia and left pons. Additional remote lacunar type infarct noted at the central cerebellum. No abnormal foci of restricted diffusion to suggest acute or subacute ischemia. Gray-white matter differentiation maintained. No other areas of chronic cortical infarction. No foci of susceptibility artifact to suggest acute or chronic intracranial  hemorrhage. No mass lesion, midline shift or mass effect. No hydrocephalus or extra-axial fluid collection. Pituitary gland and suprasellar region within normal limits. Midline structures intact. Vascular: Major intracranial vascular flow voids are maintained. Skull and upper cervical spine: Craniocervical junction within normal limits. Bone marrow signal intensity within normal limits. No scalp soft tissue abnormality. Sinuses/Orbits: Globes and orbital soft tissues within normal limits. Paranasal sinuses are clear. No mastoid effusion. Inner ear structures grossly normal. Other: None. IMPRESSION: 1. No acute intracranial abnormality. 2. Chronic left frontal lobe infarct, with additional remote lacunar infarcts involving the right basal ganglia, left pons, and cerebellum. Electronically Signed   By: Rise Mu M.D.   On: 10/22/2020 00:34   US Carotid Bilateral (at [facility] and [facility] only)  Result Date: 10/21/2020 CLINICAL DATA:  Transient ischemic attack EXAM: BILATERAL CAROTID DUPLEX ULTRASOUND TECHNIQUE: Wallace Cullens scale imaging, color Doppler and duplex ultrasound were performed of bilateral carotid and vertebral arteries in the neck. COMPARISON:  None. FINDINGS: Criteria: Quantification of carotid stenosis is based on velocity parameters that correlate the residual internal carotid diameter with NASCET-based stenosis levels, using the diameter of the distal internal carotid lumen as the denominator for stenosis measurement. The following velocity measurements were obtained: RIGHT ICA: 53/20 cm/sec CCA: 58/14 cm/sec SYSTOLIC ICA/CCA RATIO:  0.9 ECA: 90 cm/sec LEFT ICA: 55/25 cm/sec CCA: 78/16 cm/sec SYSTOLIC ICA/CCA RATIO:  89 ECA: 0.7 cm/sec RIGHT CAROTID ARTERY: No evidence of hemodynamically significant stenosis. No significant plaque. RIGHT VERTEBRAL ARTERY:  Antegrade LEFT CAROTID ARTERY: No evidence of hemodynamically significant stenosis. No significant plaque. LEFT VERTEBRAL ARTERY:  Antegrade  IMPRESSION: No evidence of hemodynamically significant carotid bifurcation stenosis. Electronically Signed   By: Helyn Numbers M.D.   On: 10/21/2020 20:16   DG Chest Portable 1 View  Result Date: 10/21/2020 CLINICAL DATA:  Weakness and dizziness. EXAM: PORTABLE CHEST 1 VIEW COMPARISON:  08/31/2018 FINDINGS: Mild cardiac enlargement. Mild diffuse pulmonary interstitial edema present, similar to the prior study. No significant pleural effusions. No pulmonary consolidation or pneumothorax identified. IMPRESSION: Mild diffuse pulmonary interstitial edema. Electronically Signed   By: Irish Lack M.D.   On: 10/21/2020 13:20   ECHOCARDIOGRAM COMPLETE  Result Date: 10/22/2020    ECHOCARDIOGRAM REPORT   Patient Name:   IDONIA ZOLLINGER Date of Exam: 10/21/2020 Medical Rec #:  [id number]    Height:       61.0 in Accession #:    [id number]   Weight:       155.0 lb Date of Birth:  May 01, 1965    BSA:          1.695 m Patient Age:    55 years     BP:           112/83 mmHg Patient Gender: F            HR:           99 bpm. Exam Location:  [facility] Procedure: 2D Echo, Cardiac Doppler, Color Doppler and Strain Analysis Indications:     CHF-Acute Systolic I50.21  History:         Patient has prior history of Echocardiogram examinations. CHF;                  Risk Factors:Diabetes.  Sonographer:     Neysa Bonito Roar Referring Phys:  [id number] Beulah Gandy A THOMAS Diagnosing Phys: Arnoldo Hooker MD IMPRESSIONS  1. Left ventricular ejection fraction, by estimation, is 20 to 25%. The left ventricle has severely decreased function. The left ventricle demonstrates global hypokinesis. The left ventricular internal cavity size was severely dilated. There is moderate  left ventricular hypertrophy. Left ventricular diastolic parameters were normal.  2. Right ventricular systolic function is normal. The right ventricular size is normal.  3. Left atrial size was mild to moderately dilated.  4. Right atrial size was mild to moderately dilated.  5. The  mitral valve is normal in structure. Moderate to severe mitral valve regurgitation.  6. Tricuspid valve regurgitation is moderate.  7. The aortic valve is normal in structure. Aortic valve regurgitation is not visualized. FINDINGS  Left Ventricle: Left ventricular ejection fraction, by estimation, is 20 to 25%. The left ventricle has severely decreased function. The left ventricle demonstrates global hypokinesis. The left ventricular internal cavity size was severely dilated. There is moderate left ventricular hypertrophy. Left ventricular diastolic parameters were normal. Right Ventricle: The right ventricular size is normal. No increase in right ventricular wall thickness. Right ventricular systolic function is normal. Left Atrium: Left atrial size was mild to moderately dilated. Right Atrium: Right atrial size was mild to moderately dilated. Pericardium: There is no evidence of pericardial effusion. Mitral Valve: The mitral valve is normal in structure. Moderate to severe mitral valve regurgitation. Tricuspid Valve: The tricuspid valve is normal in structure. Tricuspid valve regurgitation is moderate. Aortic Valve: The aortic valve is normal in structure. Aortic valve regurgitation is not visualized. Aortic valve peak gradient measures 3.6 mmHg. Pulmonic Valve: The pulmonic valve was normal in structure. Pulmonic valve regurgitation is trivial. Aorta: The aortic root and ascending aorta are structurally normal, with no evidence of dilitation. IAS/Shunts: No atrial level shunt detected by color flow Doppler.  LEFT VENTRICLE PLAX 2D LVIDd:         5.90 cm      Diastology LVIDs:  5.50 cm      LV e' medial:    3.59 cm/s LV PW:         0.90 cm      LV E/e' medial:  25.2 LV IVS:        0.80 cm      LV e' lateral:   3.65 cm/s LVOT diam:     1.80 cm      LV E/e' lateral: 24.8 LVOT Area:     2.54 cm  LV Volumes (MOD) LV vol d, MOD A2C: 187.0 ml LV vol d, MOD A4C: 197.0 ml LV vol s, MOD A2C: 148.0 ml LV vol s, MOD  A4C: 161.0 ml LV SV MOD A2C:     39.0 ml LV SV MOD A4C:     197.0 ml LV SV MOD BP:      34.1 ml RIGHT VENTRICLE RV Basal diam:  3.70 cm RV Mid diam:    3.20 cm RV S prime:     10.00 cm/s TAPSE (M-mode): 1.8 cm LEFT ATRIUM             Index       RIGHT ATRIUM           Index LA diam:        4.50 cm 2.66 cm/m  RA Area:     15.70 cm LA Vol (A2C):   69.1 ml 40.77 ml/m RA Volume:   41.00 ml  24.19 ml/m LA Vol (A4C):   70.9 ml 41.83 ml/m LA Biplane Vol: 72.6 ml 42.84 ml/m  AORTIC VALVE               PULMONIC VALVE AV Area (Vmax): 1.50 cm   PV Vmax:          0.76 m/s AV Vmax:        95.00 cm/s PV Peak grad:     2.3 mmHg AV Peak Grad:   3.6 mmHg   PR End Diast Vel: 25.00 msec LVOT Vmax:      56.10 cm/s RVOT Peak grad:   1 mmHg  AORTA Ao Asc diam: 2.40 cm MITRAL VALVE               TRICUSPID VALVE MV Area (PHT): 6.37 cm    TR Peak grad:   38.7 mmHg MV Decel Time: 119 msec    TR Vmax:        311.00 cm/s MV E velocity: 90.40 cm/s MV A velocity: 36.40 cm/s  SHUNTS MV E/A ratio:  2.48        Systemic Diam: 1.80 cm MV A Prime:    4.6 cm/s Arnoldo Hooker MD Electronically signed by Arnoldo Hooker MD Signature Date/Time: 10/22/2020/6:24:50 AM    Final     EKG: NSR with LAE. Low voltage. Anteroseptal infarct. Long QT.  Echo 10/21/20-  1. Left ventricular ejection fraction, by estimation, is 20 to 25%. The  left ventricle has severely decreased function. The left ventricle  demonstrates global hypokinesis. The left ventricular internal cavity size  was severely dilated. There is moderate   left ventricular hypertrophy. Left ventricular diastolic parameters were  normal.   2. Right ventricular systolic function is normal. The right ventricular  size is normal.   3. Left atrial size was mild to moderately dilated.   4. Right atrial size was mild to moderately dilated.   5. The mitral valve is normal in structure. Moderate to severe mitral  valve regurgitation.   6. Tricuspid valve regurgitation  is moderate.   7.  The aortic valve is normal in structure. Aortic valve regurgitation is  not visualized.   ASSESSMENT AND PLAN:  Taylor [name] is a  55 yo F with h/o T2DM, CKD3, HTN, HLD, HFrEF (20%) who is admitted with presyncope and dizziness thought to be related to diuretics and antihypertensives in the setting of N/V and hyperglycemia.  # Dizziness, presyncope She is believed to be euvolemic or dehydrated on admission likely from contributions from nausea and vomiting as well as hyperglycemia.  She had ketones in her urine. Likely related to these issues. She has improved with holding BP medications. No further intervention. Tele shows no events in last 24 hrs.   #Dilated cardiomyopathy with an EF of 20% #Moderate to severe mitral regurgitation Presumed to be nonischemic.  Follows with Clarisa Kindred in the heart failure clinic.  BNP on admission was 1600 which is stable compared to 2 years previously.  I question her medication compliance given her A1c of 13 on admission. -Currently holding home Entresto 49-51 mg, Lasix 20 mg, Jardiance 10 mg, Coreg 25 mg twice daily - Would aim to resume some medications since these are needed long term. - Start coreg 3.125 mg BID - If patient is to be discharged, have her resume lasix 20 mg daily in 3 days.  - Close follow up in 1-2 weeks. Please have this scheduled with me prior to discharge.   Signed: Armando Reichert MD 10/24/2020, 10:36 AM

## 2020-10-24 NOTE — Progress Notes (Signed)
Mobility Specialist - Progress Note   10/24/20 1100  Mobility  Activity Ambulated in hall;Transferred:  Bed to chair  Level of Assistance Minimal assist, patient does 75% or more  Assistive Device None;Other (Comment)  Distance Ambulated (ft) 50 ft  Mobility Ambulated with assistance in hallway;Out of bed to chair with meals  Mobility Response Tolerated well  Mobility performed by Mobility specialist  $Mobility charge 1 Mobility    Pre-mobility: 92 HR, 85/73 BP, 99% SpO2 Post-mobility: 93 HR, 101/73 BP, 100% SpO2   Pt sitting EOB on arrival, lightheadedness 5/10. Ambulated in hallway with HHA. Mildly unsteady. Lightheadedness did not increase/decrease with ambulation. Pt returned to recliner with needs in reach, alarm set.    Filiberto Pinks Mobility Specialist 10/24/20, 11:59 AM

## 2020-10-28 NOTE — Progress Notes (Deleted)
Patient ID: Taylor [name], female    DOB: [birth year], 55 y.o.   MRN: [id number]  HPI   Taylor [name] is a 55 y/o female with a history of diabetes, obstructive sleep apnea, remote tobacco use and chronic heart failure.  Echo report from 10/21/20 reviewed and showed an EF of 20-25% along with mod/severe MR and moderate TR. Echo report from 09/01/2018 reviewed and showed an EF of 20-25%. Echo report from 07/08/17 reviewed and showed and EF of 45-50% along with mild MR. Echo done 08/31/15 and showed an EF of 20% without valvular regurgitation.   Admitted 10/21/20 due to dizziness and pre-syncopal spell. Lasix, carvedilol and entresto held. Given IVF. Cardiology consult obtained. Discharged after 3 days.   She presents today with a chief complaint of a follow-up visit.             Past Medical History:  Diagnosis Date   CHF (congestive heart failure) (HCC)    Diabetes mellitus without complication (HCC)    Obstructive sleep apnea    Past Surgical History:  Procedure Laterality Date   ABDOMINAL HYSTERECTOMY     Family History  Problem Relation Age of Onset   Anemia Neg Hx    Arrhythmia Neg Hx    Asthma Neg Hx    Clotting disorder Neg Hx    Fainting Neg Hx    Heart attack Neg Hx    Heart disease Neg Hx    Heart failure Neg Hx    Hyperlipidemia Neg Hx    Hypertension Neg Hx    Social History   Tobacco Use   Smoking status: Former   Smokeless tobacco: Never  Substance Use Topics   Alcohol use: No    Comment: occ   No Known Allergies     Review of Systems  Constitutional:  Negative for appetite change and fatigue.  HENT:  Negative for congestion, postnasal drip and sore throat.   Eyes: Negative.   Respiratory:  Negative for cough, chest tightness, shortness of breath and wheezing.   Cardiovascular:  Negative for chest pain, palpitations and leg swelling.  Gastrointestinal:  Negative for abdominal distention and abdominal pain.  Endocrine: Negative.   Genitourinary: Negative.    Musculoskeletal:  Negative for back pain and neck pain.  Skin: Negative.   Allergic/Immunologic: Negative.   Neurological:  Negative for dizziness and light-headedness.  Hematological:  Negative for adenopathy. Does not bruise/bleed easily.  Psychiatric/Behavioral:  Negative for dysphoric mood and sleep disturbance (sleeping on 2 pillows). The patient is not nervous/anxious.        Physical Exam Vitals and nursing note reviewed.  Constitutional:      Appearance: She is well-developed.  HENT:     Head: Normocephalic and atraumatic.  Neck:     Vascular: No JVD.  Cardiovascular:     Rate and Rhythm: Normal rate and regular rhythm.  Pulmonary:     Effort: Pulmonary effort is normal.     Breath sounds: No wheezing or rales.  Abdominal:     General: There is no distension.     Palpations: Abdomen is soft.     Tenderness: There is no abdominal tenderness.  Musculoskeletal:        General: No tenderness.     Cervical back: Normal range of motion and neck supple.  Skin:    General: Skin is warm and dry.  Neurological:     Mental Status: She is alert and oriented to person, place, and time.  Psychiatric:  Behavior: Behavior normal.        Thought Content: Thought content normal.   Assessment & Plan:  1: Chronic heart failure with reduced ejection fraction- - NYHA class I - euvolemic today - She is weighing daily and reminded to call for an overnight weight gain of >2 pounds or a weekly weight gain of >5 pounds. - weight 160 pounds from her last visit here 10 months ago - on GDMT of  - last saw cardiologist Gwen Pounds) 10/03/15; says that she hasn't returned to him because she can't afford the $100 co-pay as she's trying to pay off her hospital bills - BMP on 10/24/20 reviewed and showed sodium 140, potassium 4.0, creatinine 0.64 and GFR >60 - BNP 10/21/20 was 1597.6   2: Diabetes- - glucose at home this morning was  - A1c was 13.4% on 10/22/20 - saw PCP Dareen Piano)  07/25/20 - saw endocrinology Rosaura Carpenter) 09/08/19     Patient did not bring her medications nor a list. Each medication was verbally reviewed with the patient and she was encouraged to bring the bottles to every visit to confirm accuracy of list.

## 2020-10-29 ENCOUNTER — Ambulatory Visit: Payer: 59 | Admitting: Family

## 2020-10-30 ENCOUNTER — Telehealth: Payer: Self-pay | Admitting: Nurse Practitioner

## 2020-10-30 NOTE — Telephone Encounter (Signed)
Attempted to contact patient to schedule a Palliative Consult, no answer - left message with reason for call along with my name and call back number, requesting a return call.

## 2020-10-31 ENCOUNTER — Encounter: Payer: Self-pay | Admitting: Family

## 2020-10-31 ENCOUNTER — Ambulatory Visit: Payer: 59 | Attending: Family | Admitting: Family

## 2020-10-31 ENCOUNTER — Other Ambulatory Visit: Payer: Self-pay

## 2020-10-31 VITALS — BP 137/89 | HR 98 | Resp 20 | Ht 61.0 in | Wt 148.1 lb

## 2020-10-31 DIAGNOSIS — E119 Type 2 diabetes mellitus without complications: Secondary | ICD-10-CM | POA: Insufficient documentation

## 2020-10-31 DIAGNOSIS — Z79899 Other long term (current) drug therapy: Secondary | ICD-10-CM | POA: Diagnosis not present

## 2020-10-31 DIAGNOSIS — Z7982 Long term (current) use of aspirin: Secondary | ICD-10-CM | POA: Insufficient documentation

## 2020-10-31 DIAGNOSIS — R42 Dizziness and giddiness: Secondary | ICD-10-CM | POA: Diagnosis not present

## 2020-10-31 DIAGNOSIS — I5022 Chronic systolic (congestive) heart failure: Secondary | ICD-10-CM | POA: Insufficient documentation

## 2020-10-31 DIAGNOSIS — Z7984 Long term (current) use of oral hypoglycemic drugs: Secondary | ICD-10-CM | POA: Diagnosis not present

## 2020-10-31 DIAGNOSIS — H538 Other visual disturbances: Secondary | ICD-10-CM | POA: Diagnosis not present

## 2020-10-31 DIAGNOSIS — Z87891 Personal history of nicotine dependence: Secondary | ICD-10-CM | POA: Diagnosis not present

## 2020-10-31 DIAGNOSIS — G4733 Obstructive sleep apnea (adult) (pediatric): Secondary | ICD-10-CM | POA: Insufficient documentation

## 2020-10-31 DIAGNOSIS — Z794 Long term (current) use of insulin: Secondary | ICD-10-CM | POA: Diagnosis not present

## 2020-10-31 DIAGNOSIS — I890 Lymphedema, not elsewhere classified: Secondary | ICD-10-CM | POA: Insufficient documentation

## 2020-10-31 NOTE — Progress Notes (Signed)
Patient ID: Taylor [name], female    DOB: 09-25-1965, 56 y.o.   MRN: [id number]  HPI   Taylor [name] is a 55 y/o female with a history of diabetes, obstructive sleep apnea, remote tobacco use and chronic heart failure.  Echo report from 10/21/20 reviewed and showed an EF of 20-25% along with mod/severe MR and moderate TR. Echo report from 09/01/2018 reviewed and showed an EF of 20-25%. Echo report from 07/08/17 reviewed and showed and EF of 45-50% along with mild MR. Echo done 08/31/15 and showed an EF of 20% without valvular regurgitation.   Admitted 10/21/20 due to dizziness and pre-syncopal spell. Lasix, carvedilol and entresto held. Given IVF. Cardiology consult obtained. Discharged after 3 days.   She presents today for a follow-up visit with a chief complaint of minimal fatigue with moderate exertion. She describes this as chronic in nature having been present intermittently for years. She has associated blurry vision, pedal edema and dizziness along with this. She denies any difficulty sleeping, abdominal distention, palpitations, chest pain, shortness of breath, cough or weight gain.   She says that she continues to get dizzy every time she stands or walks around. Denies any dizziness when sitting and does say that she is rising from a seated position carefully. Is unsure of how much fluids she is drinking.   Past Medical History:  Diagnosis Date   CHF (congestive heart failure) (HCC)    Diabetes mellitus without complication (HCC)    Obstructive sleep apnea    Past Surgical History:  Procedure Laterality Date   ABDOMINAL HYSTERECTOMY     Family History  Problem Relation Age of Onset   Anemia Neg Hx    Arrhythmia Neg Hx    Asthma Neg Hx    Clotting disorder Neg Hx    Fainting Neg Hx    Heart attack Neg Hx    Heart disease Neg Hx    Heart failure Neg Hx    Hyperlipidemia Neg Hx    Hypertension Neg Hx    Social History   Tobacco Use   Smoking status: Former   Smokeless tobacco:  Never  Substance Use Topics   Alcohol use: No    Comment: occ   No Known Allergies  Prior to Admission medications   Medication Sig Start Date End Date Taking? Authorizing Provider  aspirin EC 81 MG EC tablet Take 1 tablet (81 mg total) by mouth daily. 09/04/18  Yes Gouru, Deanna Artis, MD  atorvastatin (LIPITOR) 40 MG tablet Take 1 tablet (40 mg total) by mouth daily. 03/10/18  Yes Raphael Espe, Inetta Fermo A, FNP  empagliflozin (JARDIANCE) 25 MG TABS tablet Take 1 tablet (25 mg total) by mouth daily. 01/16/20  Yes Xayne Brumbaugh, Inetta Fermo A, FNP  furosemide (LASIX) 20 MG tablet Take 1 tablet (20 mg total) by mouth daily. Weight gain 2 pounds overnight, 5 pounds in a week, shortness of breath, or leg swelling 10/24/20  Yes Arnetha Courser, MD  glimepiride (AMARYL) 2 MG tablet Take 4 mg by mouth 2 (two) times daily. 02/15/15  Yes [provider]  insulin detemir (LEVEMIR) 100 unit/ml SOLN Inject 17 Units into the skin 2 (two) times daily.    Yes [provider]  metFORMIN (GLUCOPHAGE) 1000 MG tablet Take 1 tablet (1,000 mg total) 2 (two) times daily with a meal by mouth. 12/31/16  Yes Delma Freeze, FNP    Review of Systems  Constitutional:  Positive for fatigue. Negative for appetite change.  HENT:  Negative for  congestion, postnasal drip and sore throat.   Eyes:  Positive for visual disturbance (blurry vision).  Respiratory:  Negative for cough, chest tightness, shortness of breath and wheezing.   Cardiovascular:  Positive for leg swelling (around ankles; resolves overnight). Negative for chest pain and palpitations.  Gastrointestinal:  Negative for abdominal distention and abdominal pain.  Endocrine: Negative.   Genitourinary: Negative.   Musculoskeletal:  Negative for back pain and neck pain.  Skin: Negative.   Allergic/Immunologic: Negative.   Neurological:  Positive for dizziness. Negative for light-headedness.  Hematological:  Negative for adenopathy. Does not bruise/bleed easily.   Psychiatric/Behavioral:  Negative for dysphoric mood and sleep disturbance (sleeping on 2 pillows). The patient is not nervous/anxious.    Vitals:   10/31/20 1410  BP: 137/89  Pulse: 98  Resp: 20  SpO2: 100%  Weight: 148 lb 2 oz (67.2 kg)  Height: 5\' 1"  (1.549 m)   Wt Readings from Last 3 Encounters:  10/31/20 148 lb 2 oz (67.2 kg)  10/24/20 144 lb 2.9 oz (65.4 kg)  01/16/20 160 lb (72.6 kg)   Lab Results  Component Value Date   CREATININE 0.64 10/24/2020   CREATININE 0.70 10/23/2020   CREATININE 0.66 10/22/2020   Physical Exam Vitals and nursing note reviewed.  Constitutional:      Appearance: She is well-developed.  HENT:     Head: Normocephalic and atraumatic.  Neck:     Vascular: No JVD.  Cardiovascular:     Rate and Rhythm: Normal rate and regular rhythm.  Pulmonary:     Effort: Pulmonary effort is normal.     Breath sounds: No wheezing or rales.  Abdominal:     General: There is no distension.     Palpations: Abdomen is soft.     Tenderness: There is no abdominal tenderness.  Musculoskeletal:        General: No tenderness.     Cervical back: Normal range of motion and neck supple.     Right lower leg: Edema (1+ pitting) present.     Left lower leg: Edema (trace pitting) present.  Skin:    General: Skin is warm and dry.  Neurological:     Mental Status: She is alert and oriented to person, place, and time.  Psychiatric:        Behavior: Behavior normal.        Thought Content: Thought content normal.   Assessment & Plan:  1: Chronic heart failure with reduced ejection fraction- - NYHA class II - euvolemic today - weighing daily and reminded to call for an overnight weight gain of >2 pounds or a weekly weight gain of >5 pounds. - weight down 12 pounds from her last visit here 10 months ago - on GDMT of jardiance - entresto, carvedilol and spironolactone were held due to hypotension/ dizziness - discussed resuming low dose of one of the above  medications at her next visit - last saw cardiologist 12/22/2020) 10/03/15; says that she hasn't returned to him because she can't afford the $100 co-pay as she's trying to pay off her hospital bills - has upcoming cardiology 10/05/15) appt on 11/07/20 - she is unsure of her fluid intake so she was instructed to drink between 60-64 ounces of fluid daily - BMP on 10/24/20 reviewed and showed sodium 140, potassium 4.0, creatinine 0.64 and GFR >60 - BNP 10/21/20 was 1597.6  2: Diabetes- - A1c was 13.4% on 10/22/20 - saw PCP 12/22/20) 07/25/20; returns 11/23/20 - saw endocrinology 01/23/21) 09/08/19  3: Lymphedema- - stage 2 - elevating legs and she says that the swelling goes down overnight after she's been in the bed - encouraged to get compression socks and put them on every morning with removal at bedtime - hasn't been very active due to dizziness - consider lymphapress compression boots if edema persists   Medication list reviewed.   Return in 1 month or sooner for any questions/problems before then.

## 2020-10-31 NOTE — Patient Instructions (Addendum)
Continue weighing daily and call for an overnight weight gain of > 2 pounds or a weekly weight gain of >5 pounds.     Get compression socks and put them on every morning with removal at bedtime.    Drink around 60-64 ounces of fluids every day.

## 2020-11-02 ENCOUNTER — Telehealth: Payer: Self-pay | Admitting: Nurse Practitioner

## 2020-11-02 NOTE — Telephone Encounter (Signed)
Attempted to contact patient to schedule a Palliative Consult, no answer - left message requesting a return call.  I also attempted to contact Rondell Reams (sign. other) with no answer and unable to leave a message due to no VM.  I then attempted to reach patient's son Apolinar Junes with no answer - left message requesting a return call.

## 2020-11-05 ENCOUNTER — Telehealth: Payer: Self-pay | Admitting: Nurse Practitioner

## 2020-11-05 NOTE — Telephone Encounter (Signed)
Attempted to reach patient to schedule Palliative Consult, no answer - left message requesting a call back to let me know if she wishes to pursue Palliative services and even if she doesn't want Palliative services, I explained on the VM that I needed to know this so that if she doesn't want our services that I needed to cancel the referral and notify Dr. Dareen Piano.  Left my name and call back number.

## 2020-11-07 ENCOUNTER — Telehealth: Payer: Self-pay

## 2020-11-07 NOTE — Telephone Encounter (Signed)
RN left message on patient's phone for callback to schedule palliative referral

## 2020-11-30 ENCOUNTER — Ambulatory Visit: Payer: 59 | Attending: Family | Admitting: Family

## 2020-11-30 ENCOUNTER — Other Ambulatory Visit: Payer: Self-pay

## 2020-11-30 ENCOUNTER — Encounter: Payer: Self-pay | Admitting: Family

## 2020-11-30 VITALS — BP 107/82 | HR 91 | Resp 18 | Ht 61.0 in | Wt 149.0 lb

## 2020-11-30 DIAGNOSIS — I890 Lymphedema, not elsewhere classified: Secondary | ICD-10-CM | POA: Insufficient documentation

## 2020-11-30 DIAGNOSIS — I5032 Chronic diastolic (congestive) heart failure: Secondary | ICD-10-CM | POA: Diagnosis not present

## 2020-11-30 DIAGNOSIS — R42 Dizziness and giddiness: Secondary | ICD-10-CM | POA: Insufficient documentation

## 2020-11-30 DIAGNOSIS — I5022 Chronic systolic (congestive) heart failure: Secondary | ICD-10-CM

## 2020-11-30 DIAGNOSIS — Z794 Long term (current) use of insulin: Secondary | ICD-10-CM | POA: Insufficient documentation

## 2020-11-30 DIAGNOSIS — Z8249 Family history of ischemic heart disease and other diseases of the circulatory system: Secondary | ICD-10-CM | POA: Diagnosis not present

## 2020-11-30 DIAGNOSIS — E119 Type 2 diabetes mellitus without complications: Secondary | ICD-10-CM | POA: Insufficient documentation

## 2020-11-30 MED ORDER — EMPAGLIFLOZIN 25 MG PO TABS: 25.0000 mg | ORAL_TABLET | Freq: Every day | ORAL | 3 refills | Status: AC

## 2020-11-30 MED ORDER — LISINOPRIL 2.5 MG PO TABS: 2.5000 mg | ORAL_TABLET | Freq: Every day | ORAL | 5 refills | Status: AC

## 2020-11-30 NOTE — Patient Instructions (Addendum)
Continue weighing daily and call for an overnight weight gain of > 2 pounds or a weekly weight gain of >5 pounds.    Begin lisinopril 2.5mg  once a day

## 2020-11-30 NOTE — Progress Notes (Signed)
Patient ID: Taylor [name], female    DOB: October 19, 1965, 55 y.o.   MRN: [id number]  HPI   Ms Behan is a 55 y/o female with a history of diabetes, obstructive sleep apnea, remote tobacco use and chronic heart failure.  Echo report from 10/21/20 reviewed and showed an EF of 20-25% along with mod/severe MR and moderate TR. Echo report from 09/01/2018 reviewed and showed an EF of 20-25%. Echo report from 07/08/17 reviewed and showed and EF of 45-50% along with mild MR. Echo done 08/31/15 and showed an EF of 20% without valvular regurgitation.   Was in the ED 11/23/20 due to dizziness and shortness of breath where she was evaluated and released. Admitted 10/21/20 due to dizziness and pre-syncopal spell. Lasix, carvedilol and entresto held. Given IVF. Cardiology consult obtained. Discharged after 3 days.   She presents today for a follow-up visit with a chief complaint of minimal fatigue upon moderate exertion. She describes this as chronic in nature having been present for several years. She has associated shortness of breath, pedal edema, dizziness and difficulty sleeping along with this. She denies an abdominal distention, palpitations, chest pain, wheezing, cough or weight gain.   She says that her dizziness is "no better and no worse" than it was previously.   Past Medical History:  Diagnosis Date   CHF (congestive heart failure) (HCC)    Diabetes mellitus without complication (HCC)    Obstructive sleep apnea    Past Surgical History:  Procedure Laterality Date   ABDOMINAL HYSTERECTOMY     Family History  Problem Relation Age of Onset   Anemia Neg Hx    Arrhythmia Neg Hx    Asthma Neg Hx    Clotting disorder Neg Hx    Fainting Neg Hx    Heart attack Neg Hx    Heart disease Neg Hx    Heart failure Neg Hx    Hyperlipidemia Neg Hx    Hypertension Neg Hx    Social History   Tobacco Use   Smoking status: Former   Smokeless tobacco: Never  Substance Use Topics   Alcohol use: No    Comment:  occ   No Known Allergies  Prior to Admission medications   Medication Sig Start Date End Date Taking? Authorizing Provider  aspirin EC 81 MG EC tablet Take 1 tablet (81 mg total) by mouth daily. 09/04/18  Yes Gouru, Deanna Artis, MD  atorvastatin (LIPITOR) 40 MG tablet Take 1 tablet (40 mg total) by mouth daily. 03/10/18  Yes Harla Mensch, Inetta Fermo A, FNP  empagliflozin (JARDIANCE) 25 MG TABS tablet Take 1 tablet (25 mg total) by mouth daily. 01/16/20  Yes Chihiro Frey, Inetta Fermo A, FNP  furosemide (LASIX) 20 MG tablet Take 1 tablet (20 mg total) by mouth daily. Weight gain 2 pounds overnight, 5 pounds in a week, shortness of breath, or leg swelling 10/24/20  Yes Arnetha Courser, MD  glimepiride (AMARYL) 2 MG tablet Take 4 mg by mouth 2 (two) times daily. 02/15/15  Yes [provider]  insulin detemir (LEVEMIR) 100 unit/ml SOLN Inject 17 Units into the skin 2 (two) times daily.    Yes [provider]  metFORMIN (GLUCOPHAGE) 1000 MG tablet Take 1 tablet (1,000 mg total) 2 (two) times daily with a meal by mouth. 12/31/16  Yes Delma Freeze, FNP   Review of Systems  Constitutional:  Positive for fatigue. Negative for appetite change.  HENT:  Negative for congestion, postnasal drip and sore throat.   Eyes:  Positive for visual disturbance (blurry vision).  Respiratory:  Positive for shortness of breath. Negative for cough, chest tightness and wheezing.   Cardiovascular:  Positive for leg swelling (around ankles; resolves overnight). Negative for chest pain and palpitations.  Gastrointestinal:  Negative for abdominal distention and abdominal pain.  Endocrine: Negative.   Genitourinary: Negative.   Musculoskeletal:  Negative for back pain and neck pain.  Skin: Negative.   Allergic/Immunologic: Negative.   Neurological:  Positive for dizziness. Negative for light-headedness.  Hematological:  Negative for adenopathy. Does not bruise/bleed easily.  Psychiatric/Behavioral:  Positive for sleep disturbance  (sleeping on 2 pillows). Negative for dysphoric mood. The patient is not nervous/anxious.    Vitals:   11/30/20 1325  BP: 107/82  Pulse: 91  Resp: 18  SpO2: 100%  Weight: 149 lb (67.6 kg)  Height: 5\' 1"  (1.549 m)   Wt Readings from Last 3 Encounters:  11/30/20 149 lb (67.6 kg)  10/31/20 148 lb 2 oz (67.2 kg)  10/24/20 144 lb 2.9 oz (65.4 kg)   Lab Results  Component Value Date   CREATININE 0.64 10/24/2020   CREATININE 0.70 10/23/2020   CREATININE 0.66 10/22/2020    Physical Exam Vitals and nursing note reviewed.  Constitutional:      Appearance: She is well-developed.  HENT:     Head: Normocephalic and atraumatic.  Neck:     Vascular: No JVD.  Cardiovascular:     Rate and Rhythm: Normal rate and regular rhythm.  Pulmonary:     Effort: Pulmonary effort is normal.     Breath sounds: No wheezing or rales.  Abdominal:     General: There is no distension.     Palpations: Abdomen is soft.     Tenderness: There is no abdominal tenderness.  Musculoskeletal:        General: No tenderness.     Cervical back: Normal range of motion and neck supple.     Right lower leg: Edema (1+ pitting) present.     Left lower leg: Edema (1+ pitting) present.  Skin:    General: Skin is warm and dry.  Neurological:     Mental Status: She is alert and oriented to person, place, and time.  Psychiatric:        Behavior: Behavior normal.        Thought Content: Thought content normal.   Assessment & Plan:  1: Chronic heart failure with reduced ejection fraction- - NYHA class II - euvolemic today - weighing daily and reminded to call for an overnight weight gain of >2 pounds or a weekly weight gain of >5 pounds. - weight unchanged from her last visit here 1 month ago - on GDMT of jardiance - entresto, carvedilol and spironolactone were held due to hypotension/ dizziness - will add lisinopril 2.5mg  daily as I don't think her BP could tolerate entresto; if the dizziness worsens, she is to  stop taking the lisinopril - will check BMP next visit - saw cardiology 12/22/2020) on 11/07/20; returns December - she is unsure of her fluid intake so she was instructed to drink between 60-64 ounces of fluid daily - BMP on 11/23/20 reviewed and showed sodium 135, potassium 3.6, creatinine 0.51 and GFR >90 - BNP 11/23/20 was 1390  2: Diabetes- - A1c was 13.4% on 10/22/20 - fasting glucose at home today was 98 - saw PCP 12/22/20) 07/25/20; returns November - saw endocrinology December) 09/08/19   3: Lymphedema- - stage 2 - elevating legs and she says that  the swelling goes down overnight after she's been in the bed - again instructed to get compression socks and put them on every morning with removal at bedtime - hasn't been very active due to dizziness - consider lymphapress compression boots if edema persists   Medication list reviewed.   Return in 1 month or sooner for any questions/problems before then.

## 2021-01-13 NOTE — Progress Notes (Signed)
Patient ID: Taylor [name], female    DOB: Aug 09, 1965, 55 y.o.   MRN: [id number]  HPI   Ms Divito is a 55 y/o female with a history of diabetes, obstructive sleep apnea, remote tobacco use and chronic heart failure.  Echo report from 10/21/20 reviewed and showed an EF of 20-25% along with mod/severe MR and moderate TR. Echo report from 09/01/2018 reviewed and showed an EF of 20-25%. Echo report from 07/08/17 reviewed and showed and EF of 45-50% along with mild MR. Echo done 08/31/15 and showed an EF of 20% without valvular regurgitation.   Was in the ED 11/23/20 due to dizziness and shortness of breath where she was evaluated and released. Admitted 10/21/20 due to dizziness and pre-syncopal spell. Lasix, carvedilol and entresto held. Given IVF. Cardiology consult obtained. Discharged after 3 days.   She presents today for a follow-up visit with a chief complaint of minimal fatigue upon moderate exertion. She describes this as chronic in nature having been present for several years although she does feel a little better since she was last here. She has associated cough, pedal edema, difficulty sleeping & dizziness along with this. She denies any abdominal distention, palpitations, chest pain, wheezing, shortness of breath or weight gain.   Started low-dose lisinopril at last visit and she says that she hasn't had any issues with taking it that she's aware of.   Husband that is present says that he's been unable to get the compression socks on patient. He says that she's unable to get them on herself and he's also not able to pull them on without hurting his shoulder.   Past Medical History:  Diagnosis Date   CHF (congestive heart failure) (HCC)    Diabetes mellitus without complication ([place])    Obstructive sleep apnea    Past Surgical History:  Procedure Laterality Date   ABDOMINAL HYSTERECTOMY     Family History  Problem Relation Age of Onset   Anemia Neg Hx    Arrhythmia Neg Hx    Asthma Neg Hx     Clotting disorder Neg Hx    Fainting Neg Hx    Heart attack Neg Hx    Heart disease Neg Hx    Heart failure Neg Hx    Hyperlipidemia Neg Hx    Hypertension Neg Hx    Social History   Tobacco Use   Smoking status: Former   Smokeless tobacco: Never  Substance Use Topics   Alcohol use: No    Comment: occ   No Known Allergies  Prior to Admission medications   Medication Sig Start Date End Date Taking? Authorizing Provider  aspirin EC 81 MG EC tablet Take 1 tablet (81 mg total) by mouth daily. 09/04/18  Yes Gouru, Illene Silver, MD  atorvastatin (LIPITOR) 40 MG tablet Take 1 tablet (40 mg total) by mouth daily. 03/10/18  Yes Gray Maugeri, Otila Kluver A, FNP  empagliflozin (JARDIANCE) 25 MG TABS tablet Take 1 tablet (25 mg total) by mouth daily. 11/30/20  Yes Hamna Asa, Otila Kluver A, FNP  furosemide (LASIX) 20 MG tablet Take 1 tablet (20 mg total) by mouth daily. Weight gain 2 pounds overnight, 5 pounds in a week, shortness of breath, or leg swelling 10/24/20  Yes Lorella Nimrod, MD  glimepiride (AMARYL) 2 MG tablet Take 4 mg by mouth 2 (two) times daily. 02/15/15  Yes [provider]  insulin detemir (LEVEMIR) 100 unit/ml SOLN Inject 17 Units into the skin 2 (two) times daily.    Yes [provider]  lisinopril (ZESTRIL) 2.5 MG tablet Take 1 tablet (2.5 mg total) by mouth daily. 11/30/20  Yes Alisa Graff, FNP  metFORMIN (GLUCOPHAGE) 1000 MG tablet Take 1 tablet (1,000 mg total) 2 (two) times daily with a meal by mouth. 12/31/16  Yes Alisa Graff, FNP   Review of Systems  Constitutional:  Positive for fatigue. Negative for appetite change.  HENT:  Negative for congestion, postnasal drip and sore throat.   Eyes:  Positive for visual disturbance (blurry vision).  Respiratory:  Positive for cough (dry cough). Negative for chest tightness, shortness of breath and wheezing.   Cardiovascular:  Positive for leg swelling (around ankles; resolves overnight). Negative for chest pain and palpitations.   Gastrointestinal:  Negative for abdominal distention and abdominal pain.  Endocrine: Negative.   Genitourinary: Negative.   Musculoskeletal:  Negative for back pain and neck pain.  Skin: Negative.   Allergic/Immunologic: Negative.   Neurological:  Positive for dizziness. Negative for light-headedness.  Hematological:  Negative for adenopathy. Does not bruise/bleed easily.  Psychiatric/Behavioral:  Positive for sleep disturbance (sleeping on 2 pillows). Negative for dysphoric mood. The patient is not nervous/anxious.    Vitals:   01/14/21 1436  BP: 108/77  Pulse: 94  Resp: 20  SpO2: 99%  Weight: 143 lb 8 oz (65.1 kg)  Height: 5\' 1"  (1.549 m)   Wt Readings from Last 3 Encounters:  01/14/21 143 lb 8 oz (65.1 kg)  11/30/20 149 lb (67.6 kg)  10/31/20 148 lb 2 oz (67.2 kg)   Lab Results  Component Value Date   CREATININE 0.64 10/24/2020   CREATININE 0.70 10/23/2020   CREATININE 0.66 10/22/2020   Physical Exam Vitals and nursing note reviewed.  Constitutional:      Appearance: She is well-developed.  HENT:     Head: Normocephalic and atraumatic.  Neck:     Vascular: No JVD.  Cardiovascular:     Rate and Rhythm: Normal rate and regular rhythm.  Pulmonary:     Effort: Pulmonary effort is normal.     Breath sounds: No wheezing or rales.  Abdominal:     General: There is no distension.     Palpations: Abdomen is soft.     Tenderness: There is no abdominal tenderness.  Musculoskeletal:        General: No tenderness.     Cervical back: Normal range of motion and neck supple.     Right lower leg: Edema (1+ pitting) present.     Left lower leg: Edema (1+ pitting) present.  Skin:    General: Skin is warm and dry.  Neurological:     Mental Status: She is alert and oriented to person, place, and time.  Psychiatric:        Behavior: Behavior normal.        Thought Content: Thought content normal.   Assessment & Plan:  1: Chronic heart failure with reduced ejection  fraction- - NYHA class II - euvolemic today - weighing daily and reminded to call for an overnight weight gain of >2 pounds or a weekly weight gain of >5 pounds. - weight down ~ 6 pounds from her last visit here 6 weeks ago - on GDMT of jardiance & lisinopril - entresto, carvedilol and spironolactone were held due to hypotension/ dizziness - will check BMP today since lisinopril started at last visit  - saw cardiology Corky Sox) on 11/07/20; returns December - BMP on 11/23/20 reviewed and showed sodium 135, potassium 3.6, creatinine 0.51 and  GFR >90 - BNP 11/23/20 was 1390  2: Diabetes- - A1c was 13.4% on 10/22/20 - fasting glucose at home today was  - saw PCP Dareen Piano) 07/25/20; returns next month, she thinks - saw endocrinology Rosaura Carpenter) 09/08/19   3: Lymphedema- - stage 2 - elevating legs but she says that edema persists - she is unable to get compression socks on and her husband also can't get them on due to shoulder issues - hasn't been very active due to dizziness - discussed compression boots and she is agreeable to referral being made   Medication list reviewed.   Return in 2 months or sooner for any questions/problems before then.

## 2021-01-14 ENCOUNTER — Other Ambulatory Visit
Admission: RE | Admit: 2021-01-14 | Discharge: 2021-01-14 | Disposition: A | Discharge status: Home / Self Care | Payer: 59 | Source: Ambulatory Visit | Attending: Family | Admitting: Family

## 2021-01-14 ENCOUNTER — Ambulatory Visit (HOSPITAL_BASED_OUTPATIENT_CLINIC_OR_DEPARTMENT_OTHER): Payer: 59 | Admitting: Family

## 2021-01-14 ENCOUNTER — Telehealth: Payer: Self-pay | Admitting: Family

## 2021-01-14 ENCOUNTER — Other Ambulatory Visit: Payer: Self-pay

## 2021-01-14 ENCOUNTER — Encounter: Payer: Self-pay | Admitting: Family

## 2021-01-14 VITALS — BP 108/77 | HR 94 | Resp 20 | Ht 61.0 in | Wt 143.5 lb

## 2021-01-14 DIAGNOSIS — I5022 Chronic systolic (congestive) heart failure: Secondary | ICD-10-CM | POA: Insufficient documentation

## 2021-01-14 DIAGNOSIS — I890 Lymphedema, not elsewhere classified: Secondary | ICD-10-CM

## 2021-01-14 DIAGNOSIS — G4733 Obstructive sleep apnea (adult) (pediatric): Secondary | ICD-10-CM | POA: Insufficient documentation

## 2021-01-14 DIAGNOSIS — Z87891 Personal history of nicotine dependence: Secondary | ICD-10-CM | POA: Insufficient documentation

## 2021-01-14 DIAGNOSIS — E119 Type 2 diabetes mellitus without complications: Secondary | ICD-10-CM

## 2021-01-14 DIAGNOSIS — Z7984 Long term (current) use of oral hypoglycemic drugs: Secondary | ICD-10-CM | POA: Insufficient documentation

## 2021-01-14 DIAGNOSIS — Z79899 Other long term (current) drug therapy: Secondary | ICD-10-CM | POA: Insufficient documentation

## 2021-01-14 DIAGNOSIS — Z794 Long term (current) use of insulin: Secondary | ICD-10-CM

## 2021-01-14 DIAGNOSIS — G479 Sleep disorder, unspecified: Secondary | ICD-10-CM | POA: Insufficient documentation

## 2021-01-14 DIAGNOSIS — R42 Dizziness and giddiness: Secondary | ICD-10-CM | POA: Insufficient documentation

## 2021-01-14 LAB — BASIC METABOLIC PANEL
Anion gap: 9 (ref 5–15)
BUN: 10 mg/dL (ref 6–20)
CO2: 28 mmol/L (ref 22–32)
Calcium: 8.2 mg/dL — ABNORMAL LOW (ref 8.9–10.3)
Chloride: 97 mmol/L — ABNORMAL LOW (ref 98–111)
Creatinine, Ser: 0.45 mg/dL (ref 0.44–1.00)
GFR, Estimated: >60 mL/min (ref >60)
Glucose, Bld: 243 mg/dL — ABNORMAL HIGH (ref 70–99)
Potassium: 2.7 mmol/L — CL (ref 3.5–5.1)
Sodium: 134 mmol/L — ABNORMAL LOW (ref 135–145)

## 2021-01-14 MED ORDER — POTASSIUM CHLORIDE CRYS ER 20 MEQ PO TBCR: EXTENDED_RELEASE_TABLET | ORAL | 3 refills | Status: AC

## 2021-01-14 NOTE — Patient Instructions (Signed)
Continue weighing daily and call for an overnight weight gain of > 2 pounds or a weekly weight gain of >5 pounds. 

## 2021-01-14 NOTE — Telephone Encounter (Signed)
Received critical potassium result on patient of 2.7. She is currently taking furosemide 20mg  daily and lisinopril 2.5mg  daily but no potassium supplementation. Renal function looks good.   LM on patient's number. Attempted significant other but his phone just rang without the ability to LM. Attempted to reach son but his phone rang and voicemail has not been set up. Will attempt to contact patient again tonight and if unsuccessful, will attempt to reach patient tomorrow morning.   Will send in potassium supplements to start taking tomorrow as her pharmacy is closed for the day. She will take BID tomorrow and then begin daily after that. Will have her come later this week for a recheck of her potassium level.

## 2021-01-15 ENCOUNTER — Telehealth: Payer: Self-pay

## 2021-01-15 ENCOUNTER — Other Ambulatory Visit: Payer: Self-pay | Admitting: Family

## 2021-01-15 NOTE — Telephone Encounter (Addendum)
Patient acknowledged lab results and verbalized understanding of need to go pick up potassium prescription and to take 2 tablets in the AM and 2 in the PM today, and then once a day moving forward. Patient states she can come for repeat labs on Friday at 11 AM. PAT will be notified. Pt instructed to call us with any further questions or concerns. Suanne Marker, RN [facility]   ----- Message from Delma Freeze, [place] sent at 01/15/2021  8:00 AM EST ----- Kidneys are normal. Potassium is low. Potassium prescription needs picked up at [facility] today and she needs to take 2 tablets this morning and 2 tablets later today. Starting tomorrow, she needs to take 1 tablet every day. Labs needs to be repeated Friday morning, just ask what time.

## 2021-01-18 ENCOUNTER — Other Ambulatory Visit: Payer: Self-pay

## 2021-01-18 ENCOUNTER — Other Ambulatory Visit
Admission: RE | Admit: 2021-01-18 | Discharge: 2021-01-18 | Disposition: A | Discharge status: Home / Self Care | Payer: 59 | Source: Ambulatory Visit | Attending: Family | Admitting: Family

## 2021-01-18 ENCOUNTER — Telehealth: Payer: Self-pay

## 2021-01-18 DIAGNOSIS — Z01812 Encounter for preprocedural laboratory examination: Secondary | ICD-10-CM | POA: Diagnosis present

## 2021-01-18 LAB — BASIC METABOLIC PANEL
Anion gap: 10 (ref 5–15)
BUN: 10 mg/dL (ref 6–20)
CO2: 27 mmol/L (ref 22–32)
Calcium: 8.6 mg/dL — ABNORMAL LOW (ref 8.9–10.3)
Chloride: 100 mmol/L (ref 98–111)
Creatinine, Ser: 0.55 mg/dL (ref 0.44–1.00)
GFR, Estimated: >60 mL/min (ref >60)
Glucose, Bld: 157 mg/dL — ABNORMAL HIGH (ref 70–99)
Potassium: 4.1 mmol/L (ref 3.5–5.1)
Sodium: 137 mmol/L (ref 135–145)

## 2021-01-18 NOTE — Telephone Encounter (Addendum)
Patient called and given lab results below. She acknowledged lab results and no medication changes. Patient instructed to call if any questions or concerns arise. Suanne Marker, RN  ----- Message from Delma Freeze, FNP sent at 01/18/2021 12:42 PM EST ----- Labs look good and potassium is now normal. Continue all medications including the once daily potassium

## 2021-02-12 ENCOUNTER — Encounter (HOSPITAL_COMMUNITY): Payer: Self-pay | Admitting: Neurology

## 2021-02-12 ENCOUNTER — Inpatient Hospital Stay (HOSPITAL_COMMUNITY): Payer: 59 | Admitting: Certified Registered Nurse Anesthetist

## 2021-02-12 ENCOUNTER — Emergency Department (HOSPITAL_COMMUNITY): Payer: 59

## 2021-02-12 ENCOUNTER — Inpatient Hospital Stay (HOSPITAL_COMMUNITY)
Admission: EM | Admit: 2021-02-12 | Discharge: 2021-03-20 | DRG: 003 | Disposition: E | Payer: 59 | Attending: Pulmonary Disease | Admitting: Pulmonary Disease

## 2021-02-12 ENCOUNTER — Encounter (HOSPITAL_COMMUNITY): Admission: EM | Disposition: E | Payer: Self-pay | Source: Home / Self Care | Attending: Neurology

## 2021-02-12 ENCOUNTER — Other Ambulatory Visit: Payer: Self-pay

## 2021-02-12 DIAGNOSIS — Y95 Nosocomial condition: Secondary | ICD-10-CM | POA: Diagnosis not present

## 2021-02-12 DIAGNOSIS — I513 Intracardiac thrombosis, not elsewhere classified: Secondary | ICD-10-CM | POA: Diagnosis present

## 2021-02-12 DIAGNOSIS — I429 Cardiomyopathy, unspecified: Secondary | ICD-10-CM | POA: Diagnosis present

## 2021-02-12 DIAGNOSIS — N390 Urinary tract infection, site not specified: Secondary | ICD-10-CM | POA: Diagnosis present

## 2021-02-12 DIAGNOSIS — Z4659 Encounter for fitting and adjustment of other gastrointestinal appliance and device: Secondary | ICD-10-CM

## 2021-02-12 DIAGNOSIS — R4701 Aphasia: Secondary | ICD-10-CM | POA: Diagnosis present

## 2021-02-12 DIAGNOSIS — Z5189 Encounter for other specified aftercare: Secondary | ICD-10-CM

## 2021-02-12 DIAGNOSIS — R34 Anuria and oliguria: Secondary | ICD-10-CM | POA: Diagnosis not present

## 2021-02-12 DIAGNOSIS — Z66 Do not resuscitate: Secondary | ICD-10-CM | POA: Diagnosis not present

## 2021-02-12 DIAGNOSIS — I63431 Cerebral infarction due to embolism of right posterior cerebral artery: Principal | ICD-10-CM | POA: Diagnosis present

## 2021-02-12 DIAGNOSIS — Z515 Encounter for palliative care: Secondary | ICD-10-CM | POA: Diagnosis not present

## 2021-02-12 DIAGNOSIS — R6521 Severe sepsis with septic shock: Secondary | ICD-10-CM | POA: Diagnosis not present

## 2021-02-12 DIAGNOSIS — Z978 Presence of other specified devices: Secondary | ICD-10-CM | POA: Diagnosis not present

## 2021-02-12 DIAGNOSIS — I110 Hypertensive heart disease with heart failure: Secondary | ICD-10-CM | POA: Diagnosis present

## 2021-02-12 DIAGNOSIS — W1839XA Other fall on same level, initial encounter: Secondary | ICD-10-CM | POA: Diagnosis present

## 2021-02-12 DIAGNOSIS — J9690 Respiratory failure, unspecified, unspecified whether with hypoxia or hypercapnia: Secondary | ICD-10-CM

## 2021-02-12 DIAGNOSIS — I639 Cerebral infarction, unspecified: Secondary | ICD-10-CM

## 2021-02-12 DIAGNOSIS — Z7984 Long term (current) use of oral hypoglycemic drugs: Secondary | ICD-10-CM

## 2021-02-12 DIAGNOSIS — J9602 Acute respiratory failure with hypercapnia: Secondary | ICD-10-CM | POA: Diagnosis not present

## 2021-02-12 DIAGNOSIS — Z79899 Other long term (current) drug therapy: Secondary | ICD-10-CM

## 2021-02-12 DIAGNOSIS — E119 Type 2 diabetes mellitus without complications: Secondary | ICD-10-CM

## 2021-02-12 DIAGNOSIS — R29720 NIHSS score 20: Secondary | ICD-10-CM

## 2021-02-12 DIAGNOSIS — G4733 Obstructive sleep apnea (adult) (pediatric): Secondary | ICD-10-CM | POA: Diagnosis present

## 2021-02-12 DIAGNOSIS — I255 Ischemic cardiomyopathy: Secondary | ICD-10-CM | POA: Diagnosis not present

## 2021-02-12 DIAGNOSIS — I63411 Cerebral infarction due to embolism of right middle cerebral artery: Secondary | ICD-10-CM | POA: Diagnosis present

## 2021-02-12 DIAGNOSIS — A419 Sepsis, unspecified organism: Secondary | ICD-10-CM | POA: Diagnosis not present

## 2021-02-12 DIAGNOSIS — E1165 Type 2 diabetes mellitus with hyperglycemia: Secondary | ICD-10-CM | POA: Diagnosis present

## 2021-02-12 DIAGNOSIS — J9601 Acute respiratory failure with hypoxia: Secondary | ICD-10-CM | POA: Diagnosis not present

## 2021-02-12 DIAGNOSIS — I959 Hypotension, unspecified: Secondary | ICD-10-CM | POA: Diagnosis not present

## 2021-02-12 DIAGNOSIS — R233 Spontaneous ecchymoses: Secondary | ICD-10-CM | POA: Diagnosis present

## 2021-02-12 DIAGNOSIS — Z20822 Contact with and (suspected) exposure to covid-19: Secondary | ICD-10-CM | POA: Diagnosis present

## 2021-02-12 DIAGNOSIS — E1159 Type 2 diabetes mellitus with other circulatory complications: Secondary | ICD-10-CM | POA: Diagnosis not present

## 2021-02-12 DIAGNOSIS — I5020 Unspecified systolic (congestive) heart failure: Secondary | ICD-10-CM | POA: Diagnosis not present

## 2021-02-12 DIAGNOSIS — E875 Hyperkalemia: Secondary | ICD-10-CM | POA: Diagnosis not present

## 2021-02-12 DIAGNOSIS — I5023 Acute on chronic systolic (congestive) heart failure: Secondary | ICD-10-CM | POA: Diagnosis not present

## 2021-02-12 DIAGNOSIS — R509 Fever, unspecified: Secondary | ICD-10-CM

## 2021-02-12 DIAGNOSIS — Z7982 Long term (current) use of aspirin: Secondary | ICD-10-CM

## 2021-02-12 DIAGNOSIS — Z87891 Personal history of nicotine dependence: Secondary | ICD-10-CM

## 2021-02-12 DIAGNOSIS — R1310 Dysphagia, unspecified: Secondary | ICD-10-CM | POA: Diagnosis present

## 2021-02-12 DIAGNOSIS — Z9282 Status post administration of tPA (rtPA) in a different facility within the last 24 hours prior to admission to current facility: Secondary | ICD-10-CM | POA: Diagnosis not present

## 2021-02-12 DIAGNOSIS — G8194 Hemiplegia, unspecified affecting left nondominant side: Secondary | ICD-10-CM | POA: Diagnosis present

## 2021-02-12 DIAGNOSIS — Z430 Encounter for attention to tracheostomy: Secondary | ICD-10-CM | POA: Diagnosis not present

## 2021-02-12 DIAGNOSIS — J384 Edema of larynx: Secondary | ICD-10-CM | POA: Diagnosis not present

## 2021-02-12 DIAGNOSIS — B9620 Unspecified Escherichia coli [E. coli] as the cause of diseases classified elsewhere: Secondary | ICD-10-CM | POA: Diagnosis present

## 2021-02-12 DIAGNOSIS — Y9301 Activity, walking, marching and hiking: Secondary | ICD-10-CM | POA: Diagnosis present

## 2021-02-12 DIAGNOSIS — R197 Diarrhea, unspecified: Secondary | ICD-10-CM | POA: Diagnosis not present

## 2021-02-12 DIAGNOSIS — E876 Hypokalemia: Secondary | ICD-10-CM | POA: Diagnosis present

## 2021-02-12 DIAGNOSIS — Z9911 Dependence on respirator [ventilator] status: Secondary | ICD-10-CM

## 2021-02-12 DIAGNOSIS — E785 Hyperlipidemia, unspecified: Secondary | ICD-10-CM | POA: Diagnosis present

## 2021-02-12 DIAGNOSIS — I63443 Cerebral infarction due to embolism of bilateral cerebellar arteries: Secondary | ICD-10-CM | POA: Diagnosis present

## 2021-02-12 DIAGNOSIS — J15211 Pneumonia due to Methicillin susceptible Staphylococcus aureus: Secondary | ICD-10-CM | POA: Diagnosis not present

## 2021-02-12 DIAGNOSIS — J9600 Acute respiratory failure, unspecified whether with hypoxia or hypercapnia: Secondary | ICD-10-CM

## 2021-02-12 DIAGNOSIS — H518 Other specified disorders of binocular movement: Secondary | ICD-10-CM | POA: Diagnosis present

## 2021-02-12 DIAGNOSIS — I472 Ventricular tachycardia, unspecified: Secondary | ICD-10-CM | POA: Diagnosis not present

## 2021-02-12 DIAGNOSIS — Z794 Long term (current) use of insulin: Secondary | ICD-10-CM

## 2021-02-12 DIAGNOSIS — R54 Age-related physical debility: Secondary | ICD-10-CM | POA: Diagnosis present

## 2021-02-12 DIAGNOSIS — Y92009 Unspecified place in unspecified non-institutional (private) residence as the place of occurrence of the external cause: Secondary | ICD-10-CM

## 2021-02-12 DIAGNOSIS — R0603 Acute respiratory distress: Secondary | ICD-10-CM

## 2021-02-12 DIAGNOSIS — I6389 Other cerebral infarction: Secondary | ICD-10-CM | POA: Diagnosis not present

## 2021-02-12 DIAGNOSIS — R29810 Facial weakness: Secondary | ICD-10-CM | POA: Diagnosis present

## 2021-02-12 DIAGNOSIS — R570 Cardiogenic shock: Secondary | ICD-10-CM | POA: Diagnosis not present

## 2021-02-12 DIAGNOSIS — E11649 Type 2 diabetes mellitus with hypoglycemia without coma: Secondary | ICD-10-CM | POA: Diagnosis not present

## 2021-02-12 DIAGNOSIS — N179 Acute kidney failure, unspecified: Secondary | ICD-10-CM | POA: Diagnosis not present

## 2021-02-12 DIAGNOSIS — J398 Other specified diseases of upper respiratory tract: Secondary | ICD-10-CM | POA: Diagnosis not present

## 2021-02-12 DIAGNOSIS — D72829 Elevated white blood cell count, unspecified: Secondary | ICD-10-CM | POA: Diagnosis not present

## 2021-02-12 DIAGNOSIS — Z90710 Acquired absence of both cervix and uterus: Secondary | ICD-10-CM

## 2021-02-12 DIAGNOSIS — Z0189 Encounter for other specified special examinations: Secondary | ICD-10-CM

## 2021-02-12 HISTORY — PX: IR US GUIDE VASC ACCESS RIGHT: IMG2390

## 2021-02-12 HISTORY — PX: RADIOLOGY WITH ANESTHESIA: SHX6223

## 2021-02-12 HISTORY — PX: IR PERCUTANEOUS ART THROMBECTOMY/INFUSION INTRACRANIAL INC DIAG ANGIO: IMG6087

## 2021-02-12 LAB — RESP PANEL BY RT-PCR (FLU A&B, COVID) ARPGX2
Influenza A by PCR: NEGATIVE
Influenza B by PCR: NEGATIVE
SARS Coronavirus 2 by RT PCR: NEGATIVE

## 2021-02-12 LAB — DIFFERENTIAL
Abs Immature Granulocytes: 0.04 10*3/uL (ref 0.00–0.07)
Basophils Absolute: 0 10*3/uL (ref 0.0–0.1)
Basophils Relative: 1 %
Eosinophils Absolute: 0.1 10*3/uL (ref 0.0–0.5)
Eosinophils Relative: 1 %
Immature Granulocytes: 1 %
Lymphocytes Relative: 21 %
Lymphs Abs: 1.5 10*3/uL (ref 0.7–4.0)
Monocytes Absolute: 0.7 10*3/uL (ref 0.1–1.0)
Monocytes Relative: 10 %
Neutro Abs: 4.8 10*3/uL (ref 1.7–7.7)
Neutrophils Relative %: 66 %

## 2021-02-12 LAB — CBG MONITORING, ED: Glucose-Capillary: 203 mg/dL — ABNORMAL HIGH (ref 70–99)

## 2021-02-12 LAB — I-STAT CHEM 8, ED
BUN: 20 mg/dL (ref 6–20)
BUN: 12 mg/dL (ref 6–20)
Calcium, Ion: 0.89 mmol/L — CL (ref 1.15–1.40)
Calcium, Ion: 1.1 mmol/L — ABNORMAL LOW (ref 1.15–1.40)
Chloride: 101 mmol/L (ref 98–111)
Chloride: 99 mmol/L (ref 98–111)
Creatinine, Ser: 0.5 mg/dL (ref 0.44–1.00)
Creatinine, Ser: 0.3 mg/dL — ABNORMAL LOW (ref 0.44–1.00)
Glucose, Bld: 205 mg/dL — ABNORMAL HIGH (ref 70–99)
Glucose, Bld: 172 mg/dL — ABNORMAL HIGH (ref 70–99)
HCT: 52 % — ABNORMAL HIGH (ref 36.0–46.0)
HCT: 47 % — ABNORMAL HIGH (ref 36.0–46.0)
Hemoglobin: 17.7 g/dL — ABNORMAL HIGH (ref 12.0–15.0)
Hemoglobin: 16 g/dL — ABNORMAL HIGH (ref 12.0–15.0)
Potassium: 6.6 mmol/L (ref 3.5–5.1)
Potassium: 3.1 mmol/L — ABNORMAL LOW (ref 3.5–5.1)
Sodium: 131 mmol/L — ABNORMAL LOW (ref 135–145)
Sodium: 136 mmol/L (ref 135–145)
TCO2: 27 mmol/L (ref 22–32)
TCO2: 25 mmol/L (ref 22–32)

## 2021-02-12 LAB — GLUCOSE, CAPILLARY: Glucose-Capillary: 349 mg/dL — ABNORMAL HIGH (ref 70–99)

## 2021-02-12 LAB — CBC
HCT: 49.7 % — ABNORMAL HIGH (ref 36.0–46.0)
Hemoglobin: 15.9 g/dL — ABNORMAL HIGH (ref 12.0–15.0)
MCH: 28.8 pg (ref 26.0–34.0)
MCHC: 32 g/dL (ref 30.0–36.0)
MCV: 89.9 fL (ref 80.0–100.0)
Platelets: 260 10*3/uL (ref 150–400)
RBC: 5.53 MIL/uL — ABNORMAL HIGH (ref 3.87–5.11)
RDW: 15.8 % — ABNORMAL HIGH (ref 11.5–15.5)
WBC: 7.1 10*3/uL (ref 4.0–10.5)
nRBC: 0 % (ref 0.0–0.2)

## 2021-02-12 LAB — I-STAT BETA HCG BLOOD, ED (MC, WL, AP ONLY): I-stat hCG, quantitative: 5.9 m[IU]/mL — ABNORMAL HIGH (ref <5)

## 2021-02-12 LAB — APTT: aPTT: 25 seconds (ref 24–36)

## 2021-02-12 LAB — PROTIME-INR
INR: 0.9 (ref 0.8–1.2)
Prothrombin Time: 12.3 seconds (ref 11.4–15.2)

## 2021-02-12 SURGERY — IR WITH ANESTHESIA
Anesthesia: General

## 2021-02-12 MED ORDER — VERAPAMIL HCL 2.5 MG/ML IV SOLN
INTRAVENOUS | Status: DC | PRN
  Administered 2021-02-12 (×2): 5 mg via INTRA_ARTERIAL

## 2021-02-12 MED ORDER — IOHEXOL 350 MG/ML SOLN
75.0000 mL | Freq: Once | INTRAVENOUS | Status: AC | PRN
  Administered 2021-02-12: 19:00:00 75 mL via INTRAVENOUS

## 2021-02-12 MED ORDER — SENNOSIDES-DOCUSATE SODIUM 8.6-50 MG PO TABS: 1.0000 | ORAL_TABLET | Freq: Every evening | ORAL | Status: DC | PRN

## 2021-02-12 MED ORDER — CANGRELOR BOLUS VIA INFUSION
30.0000 ug/kg | Freq: Once | INTRAVENOUS | Status: AC
  Administered 2021-02-12: 21:00:00 1902 ug via INTRAVENOUS

## 2021-02-12 MED ORDER — TIROFIBAN HCL IN NACL 5-0.9 MG/100ML-% IV SOLN
INTRAVENOUS | Status: AC
  Filled 2021-02-12: qty 100

## 2021-02-12 MED ORDER — DOCUSATE SODIUM 50 MG/5ML PO LIQD
100.0000 mg | Freq: Two times a day (BID) | ORAL | Status: DC
  Administered 2021-02-13: 02:00:00 100 mg
  Filled 2021-02-12 (×3): qty 10

## 2021-02-12 MED ORDER — FENTANYL CITRATE (PF) 100 MCG/2ML IJ SOLN
INTRAMUSCULAR | Status: DC | PRN
  Administered 2021-02-12: 100 ug via INTRAVENOUS

## 2021-02-12 MED ORDER — TICAGRELOR 90 MG PO TABS
ORAL_TABLET | ORAL | Status: AC
  Filled 2021-02-12: qty 2

## 2021-02-12 MED ORDER — INSULIN ASPART 100 UNIT/ML IJ SOLN
0.0000 [IU] | INTRAMUSCULAR | Status: DC
  Administered 2021-02-13: 01:00:00 11 [IU] via SUBCUTANEOUS
  Administered 2021-02-13: 04:00:00 3 [IU] via SUBCUTANEOUS
  Administered 2021-02-14: 12:00:00 2 [IU] via SUBCUTANEOUS
  Administered 2021-02-14: 16:00:00 3 [IU] via SUBCUTANEOUS
  Administered 2021-02-14: 20:00:00 5 [IU] via SUBCUTANEOUS
  Administered 2021-02-15: 16:00:00 3 [IU] via SUBCUTANEOUS
  Administered 2021-02-15: 2 [IU] via SUBCUTANEOUS
  Administered 2021-02-15 (×2): 3 [IU] via SUBCUTANEOUS
  Administered 2021-02-15: 09:00:00 5 [IU] via SUBCUTANEOUS
  Administered 2021-02-15: 20:00:00 3 [IU] via SUBCUTANEOUS
  Administered 2021-02-16 – 2021-02-17 (×5): 2 [IU] via SUBCUTANEOUS
  Administered 2021-02-17 – 2021-02-18 (×5): 3 [IU] via SUBCUTANEOUS
  Administered 2021-02-18 – 2021-02-19 (×3): 2 [IU] via SUBCUTANEOUS
  Administered 2021-02-19: 4 [IU] via SUBCUTANEOUS
  Administered 2021-02-19: 2 [IU] via SUBCUTANEOUS
  Administered 2021-02-19: 3 [IU] via SUBCUTANEOUS
  Administered 2021-02-20: 5 [IU] via SUBCUTANEOUS
  Administered 2021-02-20 (×2): 2 [IU] via SUBCUTANEOUS
  Administered 2021-02-20 (×2): 3 [IU] via SUBCUTANEOUS
  Administered 2021-02-20: 8 [IU] via SUBCUTANEOUS
  Administered 2021-02-20: 3 [IU] via SUBCUTANEOUS
  Administered 2021-02-21 (×2): 5 [IU] via SUBCUTANEOUS

## 2021-02-12 MED ORDER — PROPOFOL 500 MG/50ML IV EMUL
INTRAVENOUS | Status: DC | PRN
  Administered 2021-02-12: 50 ug/kg/min via INTRAVENOUS

## 2021-02-12 MED ORDER — PANTOPRAZOLE SODIUM 40 MG IV SOLR: 40.0000 mg | Freq: Every day | INTRAVENOUS | Status: DC

## 2021-02-12 MED ORDER — SODIUM CHLORIDE 0.9 % IV SOLN
2.0000 ug/kg/min | INTRAVENOUS | Status: DC
  Administered 2021-02-12: 21:00:00 2 ug/kg/min via INTRAVENOUS
  Filled 2021-02-12 (×2): qty 50

## 2021-02-12 MED ORDER — FENTANYL CITRATE (PF) 100 MCG/2ML IJ SOLN
INTRAMUSCULAR | Status: AC
  Filled 2021-02-12: qty 2

## 2021-02-12 MED ORDER — EPTIFIBATIDE 20 MG/10ML IV SOLN
INTRAVENOUS | Status: AC
  Filled 2021-02-12: qty 10

## 2021-02-12 MED ORDER — PROPOFOL 1000 MG/100ML IV EMUL
0.0000 ug/kg/min | INTRAVENOUS | Status: DC
  Administered 2021-02-14: 05:00:00 10 ug/kg/min via INTRAVENOUS
  Filled 2021-02-12 (×2): qty 100

## 2021-02-12 MED ORDER — CHLORHEXIDINE GLUCONATE 0.12% ORAL RINSE (MEDLINE KIT)
15.0000 mL | Freq: Two times a day (BID) | OROMUCOSAL | Status: DC
  Administered 2021-02-13 – 2021-03-09 (×51): 15 mL via OROMUCOSAL

## 2021-02-12 MED ORDER — PHENYLEPHRINE HCL-NACL 20-0.9 MG/250ML-% IV SOLN
INTRAVENOUS | Status: DC | PRN
  Administered 2021-02-12: 20 ug/min via INTRAVENOUS

## 2021-02-12 MED ORDER — ATORVASTATIN CALCIUM 40 MG PO TABS
40.0000 mg | ORAL_TABLET | Freq: Every day | ORAL | Status: DC
  Filled 2021-02-12: qty 1

## 2021-02-12 MED ORDER — NOREPINEPHRINE 4 MG/250ML-% IV SOLN: 0.0000 ug/min | INTRAVENOUS | Status: DC

## 2021-02-12 MED ORDER — VERAPAMIL HCL 2.5 MG/ML IV SOLN
INTRAVENOUS | Status: AC
  Filled 2021-02-12: qty 2

## 2021-02-12 MED ORDER — CLEVIDIPINE BUTYRATE 0.5 MG/ML IV EMUL: 0.0000 mg/h | INTRAVENOUS | Status: DC

## 2021-02-12 MED ORDER — ETOMIDATE 2 MG/ML IV SOLN
INTRAVENOUS | Status: DC | PRN
  Administered 2021-02-12: 14 mg via INTRAVENOUS

## 2021-02-12 MED ORDER — FENTANYL CITRATE PF 50 MCG/ML IJ SOSY: 50.0000 ug | PREFILLED_SYRINGE | INTRAMUSCULAR | Status: DC | PRN

## 2021-02-12 MED ORDER — CLOPIDOGREL BISULFATE 300 MG PO TABS
ORAL_TABLET | ORAL | Status: AC
  Filled 2021-02-12: qty 1

## 2021-02-12 MED ORDER — DEXAMETHASONE SODIUM PHOSPHATE 4 MG/ML IJ SOLN
INTRAMUSCULAR | Status: DC | PRN
  Administered 2021-02-12: 4 mg via INTRAVENOUS

## 2021-02-12 MED ORDER — TENECTEPLASE FOR STROKE
0.2500 mg/kg | PACK | Freq: Once | INTRAVENOUS | Status: AC
Start: 2021-02-12 — End: 2021-02-12
  Administered 2021-02-12: 19:00:00 16 mg via INTRAVENOUS

## 2021-02-12 MED ORDER — CHLORHEXIDINE GLUCONATE CLOTH 2 % EX PADS
6.0000 | MEDICATED_PAD | Freq: Every day | CUTANEOUS | Status: DC
  Administered 2021-02-13 – 2021-02-22 (×12): 6 via TOPICAL

## 2021-02-12 MED ORDER — ACETAMINOPHEN 325 MG PO TABS: 650.0000 mg | ORAL_TABLET | ORAL | Status: DC | PRN

## 2021-02-12 MED ORDER — ROCURONIUM BROMIDE 100 MG/10ML IV SOLN
INTRAVENOUS | Status: DC | PRN
  Administered 2021-02-12: 100 mg via INTRAVENOUS

## 2021-02-12 MED ORDER — IOHEXOL 300 MG/ML  SOLN
100.0000 mL | Freq: Once | INTRAMUSCULAR | Status: AC | PRN
  Administered 2021-02-12: 22:00:00 15 mL via INTRAVENOUS

## 2021-02-12 MED ORDER — CLEVIDIPINE BUTYRATE 0.5 MG/ML IV EMUL
INTRAVENOUS | Status: AC
  Filled 2021-02-12: qty 50

## 2021-02-12 MED ORDER — ASPIRIN 81 MG PO CHEW
CHEWABLE_TABLET | ORAL | Status: AC
  Filled 2021-02-12: qty 1

## 2021-02-12 MED ORDER — ACETAMINOPHEN 650 MG RE SUPP: 650.0000 mg | RECTAL | Status: DC | PRN

## 2021-02-12 MED ORDER — LACTATED RINGERS IV SOLN: INTRAVENOUS | Status: DC | PRN

## 2021-02-12 MED ORDER — ACETAMINOPHEN 160 MG/5ML PO SOLN
650.0000 mg | ORAL | Status: DC | PRN
  Administered 2021-02-15 – 2021-03-09 (×36): 650 mg
  Filled 2021-02-12 (×35): qty 20.3

## 2021-02-12 MED ORDER — ASPIRIN 81 MG PO CHEW: 81.0000 mg | CHEWABLE_TABLET | Freq: Every day | ORAL | Status: DC

## 2021-02-12 MED ORDER — CANGRELOR TETRASODIUM 50 MG IV SOLR
INTRAVENOUS | Status: AC
  Filled 2021-02-12: qty 50

## 2021-02-12 MED ORDER — ACETAMINOPHEN 160 MG/5ML PO SOLN: 650.0000 mg | ORAL | Status: DC | PRN

## 2021-02-12 MED ORDER — SODIUM CHLORIDE 0.9% FLUSH
3.0000 mL | Freq: Once | INTRAVENOUS | Status: DC
Start: 2021-02-12 — End: 2021-03-10

## 2021-02-12 MED ORDER — EPHEDRINE SULFATE 50 MG/ML IJ SOLN
INTRAMUSCULAR | Status: DC | PRN
  Administered 2021-02-12 (×4): 5 mg via INTRAVENOUS

## 2021-02-12 MED ORDER — TICAGRELOR 90 MG PO TABS: 180.0000 mg | ORAL_TABLET | Freq: Once | ORAL | Status: AC

## 2021-02-12 MED ORDER — ONDANSETRON HCL 4 MG/2ML IJ SOLN
4.0000 mg | Freq: Four times a day (QID) | INTRAMUSCULAR | Status: DC | PRN
  Administered 2021-02-14: 19:00:00 4 mg via INTRAVENOUS
  Filled 2021-02-12: qty 2

## 2021-02-12 MED ORDER — TICAGRELOR 90 MG PO TABS
180.0000 mg | ORAL_TABLET | Freq: Once | ORAL | Status: AC
  Administered 2021-02-13: 02:00:00 180 mg
  Filled 2021-02-12: qty 2

## 2021-02-12 MED ORDER — SODIUM CHLORIDE 0.9 % IV SOLN: INTRAVENOUS | Status: DC

## 2021-02-12 MED ORDER — ALBUMIN HUMAN 5 % IV SOLN: INTRAVENOUS | Status: DC | PRN

## 2021-02-12 MED ORDER — ORAL CARE MOUTH RINSE
15.0000 mL | OROMUCOSAL | Status: DC
  Administered 2021-02-13 – 2021-03-09 (×248): 15 mL via OROMUCOSAL

## 2021-02-12 MED ORDER — STROKE: EARLY STAGES OF RECOVERY BOOK
Freq: Once | Status: AC
  Filled 2021-02-12: qty 1

## 2021-02-12 MED ORDER — IOHEXOL 300 MG/ML  SOLN
100.0000 mL | Freq: Once | INTRAMUSCULAR | Status: AC | PRN
  Administered 2021-02-12: 22:00:00 60 mL via INTRA_ARTERIAL

## 2021-02-12 MED ORDER — POLYETHYLENE GLYCOL 3350 17 G PO PACK
17.0000 g | PACK | Freq: Every day | ORAL | Status: DC
  Filled 2021-02-12 (×2): qty 1

## 2021-02-12 MED ORDER — ASPIRIN 81 MG PO CHEW
81.0000 mg | CHEWABLE_TABLET | Freq: Every day | ORAL | Status: DC
  Administered 2021-02-13: 02:00:00 81 mg
  Filled 2021-02-12: qty 1

## 2021-02-12 NOTE — Sedation Documentation (Signed)
7 Fr Exoseal failed to deploy right groin. Holding manual pressure

## 2021-02-12 NOTE — Consult Note (Signed)
NAME:  Taylor [name], MRN:  [id number], DOB:  1965/03/14, LOS: 1 ADMISSION DATE:  2021-02-22, CONSULTATION DATE:  12/27 REFERRING MD:  Dr Tommi Rumps Melchor Amour , CHIEF COMPLAINT:   CVA  History of Present Illness:  Patient is encephalopathic and/or intubated. Therefore history has been obtained from chart review. 55 year old female with PMH as below, which is significant for HFrEF with LVEF 20-25 % (2017), DM, and OSA on CPAP. 12/27 she presented to [facility] ED with complaints of acute onset left sided weakness and slurred speech. EMS noted non-verbal and right gaze preference. LKW 1700 at which time she fell walking to the restroom. She presented to [facility] as a code stroke. Imaging consistent with occlusion of the distal basilar arterty, right P1, and proximal R P2. She was given systemic TNK and was taken to IR for angiography and possible thrombectomy. She underwent mechanical thrombectomy performed with stent retriever on the right with complete recanalization after one pass (TICI3). Mechanical thrombectomy performed on the left with stent retriever with multiple passes performed due to recurrent occlusion. Complete recanalization of the left PCA achieved. Post op she was sent to the ICU on vent for recovery. PCCM consulted for vent management and ICU care.    Pertinent  Medical History   has a past medical history of CHF (congestive heart failure) (HCC), Diabetes mellitus without complication (HCC), and Obstructive sleep apnea.   Significant Hospital Events: Including procedures, antibiotic start and stop dates in addition to other pertinent events   12/27 admit for posterior circulation stroke. TNK given. To IR. TICI 3.   Interim History / Subjective:    Objective   Blood pressure 127/69, pulse 84, temperature 98.7 F (37.1 C), temperature source Oral, resp. rate (!) 25, height 5\' 1"  (1.549 m), weight 63.4 kg, SpO2 99 %.    Vent Mode: PRVC FiO2 (%):  [40 %] 40 % Set Rate:  [18 bmp] 18  bmp Vt Set:  [380 mL-400 mL] 380 mL PEEP:  [5 cmH20] 5 cmH20 Plateau Pressure:  [21 cmH20] 21 cmH20   Intake/Output Summary (Last 24 hours) at 02/13/2021 0113 Last data filed at February 22, 2021 2132 Gross per 24 hour  Intake 1400 ml  Output 200 ml  Net 1200 ml   Filed Weights   02-22-21 1827 02-22-21 2007  Weight: 63.4 kg 63.4 kg    Examination: General: Middle aged female on vent HENT: [place]/AT, PERRL, no JVD Lungs: Clear bilateral breath sounds Cardiovascular: RRR, no MRG Abdomen: protuberant, soft, hypoactive Extremities: No acute deformity Neuro: Sedated, paralytic effects persist  Resolved Hospital Problem list     Assessment & Plan:   CVA: basilar, bilateral PI/PCA. TNK given in ED and she is s/p mechanical thrombectomy on the right and mechanical thrombectomy with stent placement on the L.  - [place] service primary - MRI pending - SBP goal 120-140 - Echo  Endotracheal tube present: OSA on CPAP - Full vent support - CXR - ABG - Daily SAT/SBT - Propofol infusion/PRN fentanyl for RASS goal -1 to -2 - Resume CPAP if she is able to be extubated.   Chronic HFrEF: LVEF 20-25% in 2017.  - repeat echo - holding home lasix for now - Careful with volume  DM - CBG monitoring and SSI - Glucose goal 140-180 - a1C - holding home glimepiride, levemir, metformin  Hypokalemia - give 40 k x 2 PO  Best Practice (right click and "Reselect all SmartList Selections" daily)   Diet/type: NPO DVT prophylaxis:  not indicated GI prophylaxis: PPI Lines: Arterial Line Foley:  N/A Code Status:  full code Last date of multidisciplinary goals of care discussion [ ]   Labs   CBC: Recent Labs  Lab 02/07/2021 1823 01/26/2021 1831 02/05/2021 1940  WBC 7.1  --   --   NEUTROABS 4.8  --   --   HGB 15.9* 17.7* 16.0*  HCT 49.7* 52.0* 47.0*  MCV 89.9  --   --   PLT 260  --   --     Basic Metabolic Panel: Recent Labs  Lab 02/04/2021 1831 01/31/2021 1940 02/16/2021 2341  NA 131* 136  134*  K 6.6* 3.1* 2.9*  CL 101 99 108  CO2  --   --  18*  GLUCOSE 205* 172* 322*  BUN 20 12 11   CREATININE 0.50 0.30* 0.48  CALCIUM  --   --  7.2*   GFR: Estimated Creatinine Clearance: 66.9 mL/min (by C-G formula based on SCr of 0.48 mg/dL). Recent Labs  Lab 01/18/2021 1823  WBC 7.1    Liver Function Tests: Recent Labs  Lab 01/18/2021 2341  AST 46*  ALT 29  ALKPHOS 110  BILITOT 1.5*  PROT 4.4*  ALBUMIN 2.0*   No results for input(s): LIPASE, AMYLASE in the last 168 hours. No results for input(s): AMMONIA in the last 168 hours.  ABG    Component Value Date/Time   HCO3 23.8 08/31/2018 2252   TCO2 25 01/29/2021 1940   ACIDBASEDEF 5.2 (H) 08/31/2018 2252   O2SAT 20.6 08/31/2018 2252     Coagulation Profile: Recent Labs  Lab 01/25/2021 1823  INR 0.9    Cardiac Enzymes: No results for input(s): CKTOTAL, CKMB, CKMBINDEX, TROPONINI in the last 168 hours.  HbA1C: Hgb A1c MFr Bld  Date/Time Value Ref Range Status  10/22/2020 04:49 AM 13.4 (H) 4.8 - 5.6 % Final    Comment:    (NOTE)         Prediabetes: 5.7 - 6.4         Diabetes: >6.4         Glycemic control for adults with diabetes: <7.0   10/21/2020 09:53 PM 13.4 (H) 4.8 - 5.6 % Final    Comment:    (NOTE)         Prediabetes: 5.7 - 6.4         Diabetes: >6.4         Glycemic control for adults with diabetes: <7.0     CBG: Recent Labs  Lab 01/20/2021 1821 01/25/2021 2342  GLUCAP 203* 349*    Review of Systems:   Patient is encephalopathic and/or intubated. Therefore history has been obtained from chart review.    Past Medical History:  She,  has a past medical history of CHF (congestive heart failure) (HCC), Diabetes mellitus without complication (HCC), and Obstructive sleep apnea.   Surgical History:   Past Surgical History:  Procedure Laterality Date   ABDOMINAL HYSTERECTOMY       Social History:   reports that she has quit smoking. She has never used smokeless tobacco. She reports that  she does not drink alcohol and does not use drugs.   Family History:  Her family history is negative for Anemia, Arrhythmia, Asthma, Clotting disorder, Fainting, Heart attack, Heart disease, Heart failure, Hyperlipidemia, and Hypertension.   Allergies No Known Allergies   Home Medications  Prior to Admission medications   Medication Sig Start Date End Date Taking? Authorizing Provider  aspirin EC 81 MG EC  tablet Take 1 tablet (81 mg total) by mouth daily. 09/04/18   Gouru, Deanna Artis, MD  atorvastatin (LIPITOR) 40 MG tablet Take 1 tablet (40 mg total) by mouth daily. 03/10/18   Delma Freeze, FNP  empagliflozin (JARDIANCE) 25 MG TABS tablet Take 1 tablet (25 mg total) by mouth daily. 11/30/20   Delma Freeze, FNP  furosemide (LASIX) 20 MG tablet Take 1 tablet (20 mg total) by mouth daily. Weight gain 2 pounds overnight, 5 pounds in a week, shortness of breath, or leg swelling 10/24/20   Arnetha Courser, MD  glimepiride (AMARYL) 2 MG tablet Take 4 mg by mouth 2 (two) times daily. 02/15/15   [provider]  insulin detemir (LEVEMIR) 100 unit/ml SOLN Inject 17 Units into the skin 2 (two) times daily.     [provider]  lisinopril (ZESTRIL) 2.5 MG tablet Take 1 tablet (2.5 mg total) by mouth daily. 11/30/20   Delma Freeze, FNP  metFORMIN (GLUCOPHAGE) 1000 MG tablet Take 1 tablet (1,000 mg total) 2 (two) times daily with a meal by mouth. 12/31/16   Delma Freeze, FNP  potassium chloride SA (KLOR-CON) 20 MEQ tablet On day 1, you will take 2 tablets in the AM and 2 tablets in the PM. Starting day 2, you will take 1 tablet every day 01/14/21   Delma Freeze, FNP     Critical care time: 34 min     Joneen Roach, AGACNP-BC [facility]  See Amion for personal pager PCCM on call pager [phone] until 7pm. Please call Elink 7p-7a. [phone]  02/13/2021 1:13 AM      ,

## 2021-02-12 NOTE — ED Triage Notes (Signed)
Pt arrived to ED via [facility] from home as a CODE STROKE. LKW 1700 w/ the following s/s reported by EMS: L side paralysis, R gaze, slurred speech which turned into expressive aphasia en route, L facial droop. EMS placed 18g R AC, 20 L wrist. ESM gave pt 150-249mL NS fluid bolus. VSS w/ EMS: BP 113/80, HR 90, CBG 237, 97% RA, ETCO2 33, RR 20.

## 2021-02-12 NOTE — ED Notes (Signed)
I know the patient has not  been here long when you get a chance call the son for an update his name is Taylor [name] he is the first contact  [phone]

## 2021-02-12 NOTE — Anesthesia Procedure Notes (Signed)
Procedure Name: Intubation Date/Time: 01/26/2021 7:21 PM Performed by: Oletta Lamas, CRNA Pre-anesthesia Checklist: Patient identified, Emergency Drugs available, Suction available and Patient being monitored Patient Re-evaluated:Patient Re-evaluated prior to induction Oxygen Delivery Method: Circle System Utilized Preoxygenation: Pre-oxygenation with 100% oxygen Induction Type: IV induction Ventilation: Mask ventilation without difficulty Laryngoscope Size: Mac and 3 Grade View: Grade I Tube type: Oral Tube size: 7.0 mm Number of attempts: 1 Airway Equipment and Method: Stylet Placement Confirmation: ETT inserted through vocal cords under direct vision, positive ETCO2 and breath sounds checked- equal and bilateral Secured at: 21 cm Tube secured with: Tape Dental Injury: Teeth and Oropharynx as per pre-operative assessment

## 2021-02-12 NOTE — Procedures (Signed)
INTERVENTIONAL NEURORADIOLOGY BRIEF POSTPROCEDURE NOTE  DIAGNOSTIC CEREBRAL ANGIOGRAM MECHANICAL THROMBECTOMY INTRACRANIAL STENTING  Attending: Dr. Baldemar Lenis  Assistant: None.  Diagnosis: Bilateral P1/PCA occlusion.  Access site: RCFA, 67F.  Access closure: Exoseal, 7 [place]  Anesthesia: GETA.  Medication used: Cangrelor IV bolus and drip.  Complications: None.  Estimated blood loss: 200 mL  Specimen: None.  Findings: Occlusion of the Bilateral P1/PCA. Mechanical thrombectomy performed with stent retriever on the right with complete recanalization after one pass (TICI3). Mechanical thrombectomy performed on the left with stent retriever with multiple passes performed due to recurrent occlusion. Complete recanalization of the left PCA achieved after a 4 x 16 mm enterprise stent was deployed in the left P1-P2/PCA.   PLAN: - Bed rest x 6 hours s/p femoral access. - SBP 120-140 mmHg. - Head CT at 00:15 (02/13/2021). If no bleed, load Brilinta 180mg  and ASA 81 mg. If head CT shows hemorrhagic complication, please do not load ASA + Brilinta and call NIR. Do not discontinue cangrelor prior to Brilinta loading unless specifically instructed to do so. In case of doubt, reach out at [phone]. - Patient remained intubated per anesthesia concerns with airway protection as patient was obtunded prior to intervention.

## 2021-02-12 NOTE — Sedation Documentation (Signed)
Patient transported to 4 N ICU with CRNA Josh. Floor staff at the bedside to receive patient. Groin site assessed. Clean, dry and intact. No hematoma noted, soft to palpation. See flowsheet for pulse checks q15 min

## 2021-02-12 NOTE — ED Notes (Signed)
Report given to IR RN

## 2021-02-12 NOTE — ED Provider Notes (Signed)
[facility] EMERGENCY DEPARTMENT Provider Note   CSN: [id number] Arrival date & time: 02/11/2021  1820     History No chief complaint on file.   Taylor [name] is a 55 y.o. female.  HPI  This patient is a critically ill-appearing 55 year old female with known diabetes and congestive heart failure coming from home by EMS transport from [facility].  She was last seen normal at 5:00 PM.  I personally saw the patient on arrival at 6:22 PM.  I assessed the patient with the presence of the paramedics and the neurology team on arrival.  The patient is unable to speak at all, the paramedics report dense left-sided hemiparesis rightward gaze and left-sided facial droop.  They activated a code stroke from the field and arrived to the hospital soon as possible.  Past Medical History:  Diagnosis Date   CHF (congestive heart failure) ([place])    Diabetes mellitus without complication ([place])    Obstructive sleep apnea     Patient Active Problem List   Diagnosis Date Noted   Dizziness 10/23/2020   Hypotension 10/23/2020   CHF (congestive heart failure) ([place]) 10/21/2020   Sepsis ([place]) 08/31/2018   CAP (community acquired pneumonia) 08/31/2018   Acute respiratory failure with hypoxia ([place]) 08/31/2018   Obstructive sleep apnea 09/03/2016   Diabetes ([place]) 09/14/2015   Tachycardia 09/14/2015   Acute on chronic systolic CHF (congestive heart failure) ([place]) 08/30/2015    Past Surgical History:  Procedure Laterality Date   ABDOMINAL HYSTERECTOMY       OB History   No obstetric history on file.     Family History  Problem Relation Age of Onset   Anemia Neg Hx    Arrhythmia Neg Hx    Asthma Neg Hx    Clotting disorder Neg Hx    Fainting Neg Hx    Heart attack Neg Hx    Heart disease Neg Hx    Heart failure Neg Hx    Hyperlipidemia Neg Hx    Hypertension Neg Hx     Social History   Tobacco Use   Smoking status: Former   Smokeless tobacco: Never  Substance Use  Topics   Alcohol use: No    Comment: occ   Drug use: No    Home Medications Prior to Admission medications   Medication Sig Start Date End Date Taking? Authorizing Provider  aspirin EC 81 MG EC tablet Take 1 tablet (81 mg total) by mouth daily. 09/04/18   Gouru, Illene Silver, MD  atorvastatin (LIPITOR) 40 MG tablet Take 1 tablet (40 mg total) by mouth daily. 03/10/18   Alisa Graff, FNP  empagliflozin (JARDIANCE) 25 MG TABS tablet Take 1 tablet (25 mg total) by mouth daily. 11/30/20   Alisa Graff, FNP  furosemide (LASIX) 20 MG tablet Take 1 tablet (20 mg total) by mouth daily. Weight gain 2 pounds overnight, 5 pounds in a week, shortness of breath, or leg swelling 10/24/20   Lorella Nimrod, MD  glimepiride (AMARYL) 2 MG tablet Take 4 mg by mouth 2 (two) times daily. 02/15/15   [provider]  insulin detemir (LEVEMIR) 100 unit/ml SOLN Inject 17 Units into the skin 2 (two) times daily.     [provider]  lisinopril (ZESTRIL) 2.5 MG tablet Take 1 tablet (2.5 mg total) by mouth daily. 11/30/20   Alisa Graff, FNP  metFORMIN (GLUCOPHAGE) 1000 MG tablet Take 1 tablet (1,000 mg total) 2 (two) times daily with a meal  by mouth. 12/31/16   Alisa Graff, FNP  potassium chloride SA (KLOR-CON) 20 MEQ tablet On day 1, you will take 2 tablets in the AM and 2 tablets in the PM. Starting day 2, you will take 1 tablet every day 01/14/21   Alisa Graff, FNP    Allergies    Patient has no known allergies.  Review of Systems   Review of Systems  Unable to perform ROS: Acuity of condition   Physical Exam Updated Vital Signs Wt 63.4 kg    BMI 26.41 kg/m   Physical Exam Vitals and nursing note reviewed.  Constitutional:      General: She is not in acute distress.    Appearance: She is well-developed.  HENT:     Head: Normocephalic and atraumatic.     Mouth/Throat:     Pharynx: No oropharyngeal exudate.  Eyes:     General: No scleral icterus.       Right eye: No discharge.         Left eye: No discharge.     Conjunctiva/sclera: Conjunctivae normal.     Pupils: Pupils are equal, round, and reactive to light.  Neck:     Thyroid: No thyromegaly.     Vascular: No JVD.  Cardiovascular:     Rate and Rhythm: Normal rate and regular rhythm.     Heart sounds: Normal heart sounds. No murmur heard.   No friction rub. No gallop.  Pulmonary:     Effort: Pulmonary effort is normal. No respiratory distress.     Breath sounds: Normal breath sounds. No wheezing or rales.  Abdominal:     General: Bowel sounds are normal. There is no distension.     Palpations: Abdomen is soft. There is no mass.     Tenderness: There is no abdominal tenderness.  Musculoskeletal:        General: No tenderness. Normal range of motion.     Cervical back: Normal range of motion and neck supple.  Lymphadenopathy:     Cervical: No cervical adenopathy.  Skin:    General: Skin is warm and dry.     Findings: No erythema or rash.  Neurological:     Mental Status: She is alert.     Coordination: Coordination normal.     Comments: Dense left-sided facial droop and left-sided weakness of arm and leg, there is no strength or movement, she is able to open her eyes, there is a disconjugate gaze and she has a rightward gaze.  Her left eye gazes down.  She is able to show her thumb and 2 fingers with the right arm, there is no weakness in the right arm, there is some weakness in the right leg.  Psychiatric:        Behavior: Behavior normal.    ED Results / Procedures / Treatments   Labs (all labs ordered are listed, but only abnormal results are displayed) Labs Reviewed  CBC - Abnormal; Notable for the following components:      Result Value   RBC 5.53 (*)    Hemoglobin 15.9 (*)    HCT 49.7 (*)    RDW 15.8 (*)    All other components within normal limits  I-STAT CHEM 8, ED - Abnormal; Notable for the following components:   Sodium 131 (*)    Potassium 6.6 (*)    Glucose, Bld 205 (*)     Calcium, Ion 0.89 (*)    Hemoglobin 17.7 (*)  HCT 52.0 (*)    All other components within normal limits  CBG MONITORING, ED - Abnormal; Notable for the following components:   Glucose-Capillary 203 (*)    All other components within normal limits  I-STAT BETA HCG BLOOD, ED ([facility], [facility], [facility] ONLY) - Abnormal; Notable for the following components:   I-stat hCG, quantitative 5.9 (*)    All other components within normal limits  DIFFERENTIAL  PROTIME-INR  APTT  COMPREHENSIVE METABOLIC PANEL    EKG None  Radiology CT HEAD CODE STROKE WO CONTRAST  Result Date: 02/05/2021 CLINICAL DATA:  Code stroke. EXAM: CT HEAD WITHOUT CONTRAST TECHNIQUE: Contiguous axial images were obtained from the base of the skull through the vertex without intravenous contrast. COMPARISON:  10/21/2020 FINDINGS: Brain: No evidence of acute infarction, hemorrhage, cerebral edema, mass, mass effect, or midline shift. Ventricles and sulci are normal for age. No extra-axial fluid collection. Redemonstrated chronic hypodensity in the left frontal lobe and right caudate head. Vascular: No hyperdense vessel or unexpected calcification. Skull: Normal. Negative for fracture or focal lesion. Sinuses/Orbits: No acute finding. Other: The mastoid air cells are well aerated. ASPECTS [facility] Stroke Program Early CT Score) - Ganglionic level infarction (caudate, lentiform nuclei, internal capsule, insula, M1-M3 cortex): 7 - Supraganglionic infarction (M4-M6 cortex): 3 Total score (0-10 with 10 being normal): 10 IMPRESSION: 1. No acute intracranial process. 2. ASPECTS is 10 Code stroke imaging results were communicated on 02/01/2021 at 6:41 pm to provider [facility] via secure text paging. Electronically Signed   By: Merilyn Baba M.D.   On: 01/26/2021 18:41    Procedures .Critical Care Performed by: Noemi Chapel, MD Authorized by: Noemi Chapel, MD   Critical care provider statement:    Critical care time (minutes):  30   Critical care  time was exclusive of:  Separately billable procedures and treating other patients and teaching time   Critical care was necessary to treat or prevent imminent or life-threatening deterioration of the following conditions:  CNS failure or compromise   Critical care was time spent personally by me on the following activities:  Development of treatment plan with patient or surrogate, discussions with consultants, evaluation of patient's response to treatment, examination of patient, ordering and review of laboratory studies, ordering and review of radiographic studies, ordering and performing treatments and interventions, pulse oximetry, re-evaluation of patient's condition and review of old charts   I assumed direction of critical care for this patient from another provider in my specialty: no     Care discussed with: admitting provider   Comments:         Medications Ordered in ED Medications  sodium chloride flush (NS) 0.9 % injection 3 mL (has no administration in time range)  tenecteplase (TNKASE) injection for Stroke 16 mg (has no administration in time range)  iohexol (OMNIPAQUE) 350 MG/ML injection 75 mL (75 mLs Intravenous Contrast Given 02/15/2021 1855)    ED Course  I have reviewed the triage vital signs and the nursing notes.  Pertinent labs & imaging results that were available during my care of the patient were reviewed by me and considered in my medical decision making (see chart for details).    MDM Rules/Calculators/A&P                          This patient presents to the ED for concern of acute stroke, possibly large vessel occlusion, this involves an extensive number of treatment options, and is a complaint  that carries with it a high risk of complications and morbidity.  The differential diagnosis includes hemorrhage, ischemia, toxic metabolic, infectious, trauma, hemorrhage   Additional history obtained:  Additional history obtained from paramedics External records  from outside source obtained and reviewed including the fact that the patient had a lasting normal at 5:00   Lab Tests:  I Ordered, reviewed, and interpreted labs.  The pertinent results include: Initial potassium of 6.6, this will need to be repeated, she has normal renal function.  CBC shows no leukocytosis, hemoglobin of 15.9 and normal platelets.  Imaging Studies ordered:  I ordered imaging studies including CT scan of the brain as well as CT angiogram I independently visualized and interpreted imaging which showed  CT scan of the brain shows no acute intracranial process, CT angiogram shows possible posterior communicating artery not perfusing I agree with the radiologist interpretation   Cardiac Monitoring:  The patient was maintained on a cardiac monitor.  I personally viewed and interpreted the cardiac monitored which showed an underlying rhythm of: Normal sinus rhythm   Medicines ordered and prescription drug management:  I ordered medication including thrombolytic therapy for acute ischemic stroke Reevaluation of the patient after these medicines showed that the patient stayed the same I have reviewed the patients home medicines and have made adjustments as needed   Critical Interventions:  Neurology consultation tPA administration ICU admission Possible interventional radiology intervention   Consultations Obtained:  I requested consultation with the neurologist,  and discussed lab and imaging findings as well as pertinent plan - they recommend: Admission with thrombolytic therapy and possible IR consultation   Problem List / ED Course:   Hypertension, patient was seen prehospital by paramedics and was measured severely hypertensive, this improved through the course of her travels  2.  Acute stroke, likely large vessel occlusion, may need interventional radiology  3.  Hyperkalemia, this will be rechecked as this may be related to lab  error   Reevaluation:  After the interventions noted above, I reevaluated the patient and found that they have :stayed the same   Dispostion:  After consideration of the diagnostic results and the patients response to treatment feel that the patent would benefit from Admit to high level of care.       Final Clinical Impression(s) / ED Diagnoses Final diagnoses:  Acute ischemic stroke [facility])     Eber Hong, MD 02/11/2021 [phone]

## 2021-02-12 NOTE — H&P (Signed)
Neurology Consultation  Reason for Consult: Code Stroke Referring Physician: Dr. Sabra Heck  CC: Patient is unable to provide a chief complaint due to her condition  History is obtained from: EMS, chart review  HPI: Taylor [name] is a 55 y.o. female with a medical history significant for CHF, type 2 diabetes mellitus, and obstructive sleep apnea who presented to the ED on 12/27 via EMS for evaluation of acute onset of left-sided weakness, slurred speech progressing to nonverbal state, and an initial rightward gaze. Patient was last seen well at 17:00 at home with her husband when she went to walk to the restroom. She asked her husband for help walking to the restroom which he thought was strange for her. While she was walking, she collapsed and her husband had to catch her. He subsequently activated EMS who noted her symptoms as above and activated a Code Stroke.  LKW: 17:00 12/17 TNK given?: yes, patient was given TNK at 18:50 IR Thrombectomy? Yes, occlusion of the distal basilar artery, right P1, and proximal right P2 arteries. Imaging reviewed by attending neurologist and interventional radiologist and IR was activated.  Modified Rankin Scale: 0-Completely asymptomatic and back to baseline post- stroke  ROS: Unable to obtain due to altered mental status.   Past Medical History:  Diagnosis Date   CHF (congestive heart failure) (HCC)    Diabetes mellitus without complication ([place])    Obstructive sleep apnea    Past Surgical History:  Procedure Laterality Date   ABDOMINAL HYSTERECTOMY     Family History  Problem Relation Age of Onset   Anemia Neg Hx    Arrhythmia Neg Hx    Asthma Neg Hx    Clotting disorder Neg Hx    Fainting Neg Hx    Heart attack Neg Hx    Heart disease Neg Hx    Heart failure Neg Hx    Hyperlipidemia Neg Hx    Hypertension Neg Hx    Social History:   reports that she has quit smoking. She has never used smokeless tobacco. She reports that she does not drink  alcohol and does not use drugs.  Medications  Current Facility-Administered Medications:     stroke: mapping our early stages of recovery book, , Does not apply, Once, Toberman, Stevi W, NP   0.9 %  sodium chloride infusion, , Intravenous, Continuous, Toberman, Stevi W, NP   acetaminophen (TYLENOL) tablet 650 mg, 650 mg, Oral, Q4H PRN **OR** acetaminophen (TYLENOL) 160 MG/5ML solution 650 mg, 650 mg, Per Tube, Q4H PRN **OR** acetaminophen (TYLENOL) suppository 650 mg, 650 mg, Rectal, Q4H PRN, Rikki Spearing, NP   aspirin 81 MG chewable tablet, , , ,    cangrelor (KENGREAL) 50 MG SOLR, , , ,    clopidogrel (PLAVIX) 300 MG tablet, , , ,    eptifibatide (INTEGRILIN) 20 MG/10ML injection, , , ,    pantoprazole (PROTONIX) injection 40 mg, 40 mg, Intravenous, QHS, Toberman, Stevi W, NP   senna-docusate (Senokot-S) tablet 1 tablet, 1 tablet, Oral, QHS PRN, Rikki Spearing, NP   sodium chloride flush (NS) 0.9 % injection 3 mL, 3 mL, Intravenous, Once, Noemi Chapel, MD   tenecteplase [facility]) injection for Stroke 16 mg, 0.25 mg/kg, Intravenous, Once, Lindon Kiel, Unk Pinto, MD   ticagrelor (BRILINTA) 90 MG tablet, , , ,    tirofiban (AGGRASTAT) 5-0.9 MG/100ML-% injection, , , ,    verapamil (ISOPTIN) 2.5 MG/ML injection, , , ,   Current Outpatient Medications:    aspirin  EC 81 MG EC tablet, Take 1 tablet (81 mg total) by mouth daily., Disp:  , Rfl:    atorvastatin (LIPITOR) 40 MG tablet, Take 1 tablet (40 mg total) by mouth daily., Disp: 90 tablet, Rfl: 3   empagliflozin (JARDIANCE) 25 MG TABS tablet, Take 1 tablet (25 mg total) by mouth daily., Disp: 90 tablet, Rfl: 3   furosemide (LASIX) 20 MG tablet, Take 1 tablet (20 mg total) by mouth daily. Weight gain 2 pounds overnight, 5 pounds in a week, shortness of breath, or leg swelling, Disp: 90 tablet, Rfl: 3   glimepiride (AMARYL) 2 MG tablet, Take 4 mg by mouth 2 (two) times daily., Disp: , Rfl:    insulin detemir (LEVEMIR) 100 unit/ml SOLN,  Inject 17 Units into the skin 2 (two) times daily. , Disp: , Rfl:    lisinopril (ZESTRIL) 2.5 MG tablet, Take 1 tablet (2.5 mg total) by mouth daily., Disp: 30 tablet, Rfl: 5   metFORMIN (GLUCOPHAGE) 1000 MG tablet, Take 1 tablet (1,000 mg total) 2 (two) times daily with a meal by mouth., Disp: 180 tablet, Rfl: 3   potassium chloride SA (KLOR-CON) 20 MEQ tablet, On day 1, you will take 2 tablets in the AM and 2 tablets in the PM. Starting day 2, you will take 1 tablet every day, Disp: 92 tablet, Rfl: 3  Exam: Current vital signs: Wt 63.4 kg    BMI 26.41 kg/m  Vital signs in last 24 hours: Weight:  [63.4 kg] 63.4 kg (12/27 1827)  GENERAL: Drowsy, ill appearing female, laying in EMS stretcher, in no acute distress Psych: Unable to assess due to patient's condition Head: Normocephalic and atraumatic, without obvious abnormality EENT: Normal conjunctivae, dry mucous membranes, no OP obstruction LUNGS: Normal respiratory effort. Non-labored breathing on room air CV: Regular rate and rhythm on telemetry ABDOMEN: Soft, non-tender, non-distended Extremities: cool to touch with pedal edema present bilaterally. No obvious deformity.  NEURO:  Mental Status: Drowsy, opens eyes to voice. Does not verbalize, does not answer orientation questions.  She is unable to provide a clear and coherent history of present illness. She does not name objects or repeat phrases. She is able to follow simple commands.  Cranial Nerves:  II: Pupils are pinpoint bilaterally  III, IV, VI: Patient's gaze is dysconjugate with her left eye down, right eye midline. She is able to look fully left but unable to fully look right with the right eye.  V: Does not blink to threat throughout. VII: Left facial droop is present VIII: Hearing is intact to voice IX, X: Patient does not allow for visualization of her soft palate XI: Head is grossly midline XII: Does not protrude tongue to command Motor: Patient is flaccid on the  left lower extremity with significant weakness of the left upper extremity.  Patient is able to elevate her right upper extremity against gravity and sustain without vertical drift with constant coaching. Patient does not elevate her right lower extremity antigravity but withdraws with application of noxious stimuli throughout.  Sensation: Patient grimaces with application of noxious stimuli throughout Coordination: Does not perform Gait: Deferred  NIHSS: 1a Level of Conscious.: 1 1b LOC Questions: 2 1c LOC Commands: 0 2 Best Gaze: 1 3 Visual: 0 4 Facial Palsy: 2 5a Motor Arm - left: 3 5b Motor Arm - Right: 0 6a Motor Leg - Left: 4 6b Motor Leg - Right: 2 7 Limb Ataxia: 0 8 Sensory: 0 9 Best Language: 3 10 Dysarthria: 2 11  Extinct. and Inatten.: 0 TOTAL: 20  Labs I have reviewed labs in epic and the results pertinent to this consultation are: CBC    Component Value Date/Time   WBC 7.1 02/08/2021 1823   RBC 5.53 (H) 02/02/2021 1823   HGB 17.7 (H) 02/13/2021 1831   HGB 10.5 (L) 08/03/2012 1053   HCT 52.0 (H) 01/24/2021 1831   HCT 26.8 (L) 08/10/2012 0155   PLT 260 01/22/2021 1823   PLT 272 03/01/2012 1201   MCV 89.9 02/14/2021 1823   MCV 81 03/01/2012 1201   MCH 28.8 02/14/2021 1823   MCHC 32.0 02/10/2021 1823   RDW 15.8 (H) 01/31/2021 1823   RDW 16.9 (H) 03/01/2012 1201   LYMPHSABS 1.5 02/11/2021 1823   MONOABS 0.7 02/01/2021 1823   EOSABS 0.1 02/04/2021 1823   BASOSABS 0.0 02/04/2021 1823   CMP     Component Value Date/Time   NA 131 (L) 01/24/2021 1831   NA 143 08/10/2012 0155   K 6.6 (HH) 02/14/2021 1831   K 3.3 (L) 08/10/2012 0155   CL 101 02/07/2021 1831   CL 108 (H) 08/10/2012 0155   CO2 27 01/18/2021 1111   CO2 29 08/10/2012 0155   GLUCOSE 205 (H) 02/02/2021 1831   GLUCOSE 118 (H) 08/10/2012 0155   BUN 20 01/26/2021 1831   BUN 3 (L) 08/10/2012 0155   CREATININE 0.50 01/27/2021 1831   CREATININE 0.58 (L) 08/10/2012 0155   CALCIUM 8.6 (L) 01/18/2021  1111   CALCIUM 8.0 (L) 08/10/2012 0155   PROT 6.1 (L) 10/21/2020 1057   ALBUMIN 2.8 (L) 10/21/2020 1057   AST 14 (L) 10/21/2020 1057   ALT 12 10/21/2020 1057   ALKPHOS 108 10/21/2020 1057   BILITOT 1.5 (H) 10/21/2020 1057   GFRNONAA >60 01/18/2021 1111   GFRNONAA >60 08/10/2012 0155   GFRAA >60 10/17/2019 1002   GFRAA >60 08/10/2012 0155   Lipid Panel     Component Value Date/Time   CHOL 341 (H) 10/22/2020 0449   TRIG 82 10/22/2020 0449   HDL 89 10/22/2020 0449   CHOLHDL 3.8 10/22/2020 0449   VLDL 16 10/22/2020 0449   LDLCALC 236 (H) 10/22/2020 0449   Lab Results  Component Value Date   HGBA1C 13.4 (H) 10/22/2020    Imaging I have reviewed the images obtained:  CT-scan of the brain 12/27: 1. No acute intracranial process. 2. ASPECTS is 10  CT angio head and neck 12/27: 1. Non opacification of the distal basilar artery, right P1 and proximal P2, and right SCA, of indeterminate acuity. The distal right P2 reconstitutes, possibly secondary to collaterals. The left PCA is supplied by the left posterior communicating artery, with retrograde flow likely supplying the left superior cerebellar artery. 2. Poor opacification of the right vertebral artery, with only intermittent flow from the right PICA takeoff to the vertebrobasilar junction; distal right vertebral artery flow is favored to be retrograde. 3. Focal stenosis in the right A1 and A3. 4. Moderate narrowing of the right-greater-than-left supraclinoid ICA. 5. No hemodynamically significant stenosis in the neck.  Assessment: 55 y.o. female with PMHx as above who presented to the ED via EMS as a Code Stroke for evaluation of left-sided weakness, initial slurred speech which progressed to nonverbal state, drowsiness, and dysconjugate gaze. -Examination reveals patient with left lower extremity flaccid, left upper extremity weakness, disconjugate gaze with left eye downward, does not blink to threat throughout, and left facial  weakness with an NIHSS of 20.  - CT imaging  was obtained without evidence of an acute intracranial process. CT angio imaging revealed non opacification of the distal basilar artery, right P1, and proximal P2, and right SCA.  - TNK was given at 18:50 and IR was activated for thrombectomy at 18:59 after discussion with neuro-interventionalist.  - Stroke risk factors include type 2 diabetes mellitus, and OSA.  Plan: Acute Ischemic Stroke Cerebral infarction due to embolism of right posterior cerebral artery  Cerebral infarction due to thrombosis of basilar artery  Acuity: Acute Current Suspected Etiology: Pending further work up Continue Evaluation:  -Admit to: ICU -Hold Aspirin until 24 hour post tPA neuroimaging is stable and without evidence of bleeding -Prophylaxis per IR -Blood pressure control, goal of SYS <180/105 s/p TNK with definitive goal per IR following procedure -MRI/ECHO/A1C/Lipid panel. -Hyperglycemia management per SSI to maintain glucose 140-180mg /dL. -PT/OT/ST therapies and recommendations when able  CNS Cerebral edema -Close neuro monitoring  Dysarthria Dysphagia following cerebral infarction  -NPO until cleared by speech -ST -Advance diet as tolerated  Hemiplegia and hemiparesis following cerebral infarction affecting left non-dominant side  -PT/OT -PM&R consult  RESP Maintain SpO2 > 94% -Supplemental oxygen as needed  CV -Aggressive BP control, goal SBP < 180/105 following TNK with definitive BP goal per IR following procedure  Heart failure, unspecified -TTE  Hemoglobin A1c pending - Statin for goal LDL < 70  HEME AM CBC -Monitor -Transfuse for hgb < 7  ENDO Type 2 diabetes mellitus w/o complications. -SSI -goal HgbA1c < 7%  GI/GU -Gentle hydration  Fluid/Electrolyte Disorders Hyperkalemia   -Repeat labs following IR -Trend  ID Follow fever curve and WBC Possible Aspiration PNA -CXR -NPO -Monitor  Possible UTI -UA    Prophylaxis DVT:  SCDs GI: PPI Bowel: Docusate / Senna  Diet: NPO until cleared by speech  Code Status: Full Code   THE FOLLOWING WERE PRESENT ON ADMISSION: CNS -  Acute Ischemic Stroke, Hemiparesis, Hemiplegia Cardiovascular - Chronic CHF  Stevi Toberman, AGAC-NP [facility] Pager: [phone]  The patient was seen and examined with Lanae Boast, NP. The chart and diagnostic studies were reviewed. The patient was given tNK and deemed a candidate for intervention and discussed the case with neurointervention and the patient was taken for procedure.   Electronically signed by:  Marisue Humble, MD Page: [phone] 02/04/2021, 7:41 PM

## 2021-02-12 NOTE — Transfer of Care (Signed)
Immediate Anesthesia Transfer of Care Note  Patient: Taylor [name]  Procedure(s) Performed: IR WITH ANESTHESIA  Patient Location: ICU  Anesthesia Type:General  Level of Consciousness: sedated and Patient remains intubated per anesthesia plan  Airway & Oxygen Therapy: Patient remains intubated per anesthesia plan and Patient placed on Ventilator (see vital sign flow sheet for setting)  Post-op Assessment: Report given to RN and Post -op Vital signs reviewed and stable  Post vital signs: Reviewed and stable  Last Vitals:  Vitals Value Taken Time  BP 145/85   Temp    Pulse 101 02/18/2021 2255  Resp 18 Feb 18, 2021 2255  SpO2 96 % 02/18/2021 2255  Vitals shown include unvalidated device data.  Last Pain: There were no vitals filed for this visit.       Complications: No notable events documented.

## 2021-02-12 NOTE — Anesthesia Procedure Notes (Signed)
Arterial Line Insertion Start/End12/08/2020 7:25 PM, 02/12/2021 7:32 PM Performed by: Atilano Median, DO, anesthesiologist  Preanesthetic checklist: patient identified, IV checked, site marked, risks and benefits discussed, surgical consent, monitors and equipment checked, pre-op evaluation, timeout performed and anesthesia consent Left, radial was placed Catheter size: 20 G Hand hygiene performed , maximum sterile barriers used  and Seldinger technique used Allen's test indicative of satisfactory collateral circulation Attempts: 2 Procedure performed using ultrasound guided technique. Ultrasound Notes:anatomy identified, needle tip was noted to be adjacent to the nerve/plexus identified and no ultrasound evidence of intravascular and/or intraneural injection Following insertion, Biopatch and dressing applied. Post procedure assessment: normal  Patient tolerated the procedure well with no immediate complications.

## 2021-02-12 NOTE — Anesthesia Preprocedure Evaluation (Signed)
Anesthesia Evaluation   Patient confused  Preop documentation limited or incomplete due to emergent nature of procedure.  Airway Mallampati: III  TM Distance: >3 FB Neck ROM: Full    Dental  (+) Poor Dentition   Pulmonary sleep apnea , former smoker,    Pulmonary exam normal        Cardiovascular hypertension, Pt. on medications +CHF   Rhythm:Regular Rate:Normal     Neuro/Psych CODE STROKE CVA negative psych ROS   GI/Hepatic negative GI ROS, Neg liver ROS,   Endo/Other  diabetes, Type 2, Insulin Dependent, Oral Hypoglycemic Agents  Renal/GU   negative genitourinary   Musculoskeletal negative musculoskeletal ROS (+)   Abdominal (+) + obese,   Peds  Hematology negative hematology ROS (+)   Anesthesia Other Findings   Reproductive/Obstetrics                             Anesthesia Physical Anesthesia Plan  ASA: 4 and emergent  Anesthesia Plan: General   Post-op Pain Management:    Induction: Rapid sequence and Intravenous  PONV Risk Score and Plan: 3 and Ondansetron and Treatment may vary due to age or medical condition  Airway Management Planned: Mask and Oral ETT  Additional Equipment: Arterial line  Intra-op Plan:   Post-operative Plan: Post-operative intubation/ventilation  Informed Consent:     Only emergency history available  Plan Discussed with: CRNA  Anesthesia Plan Comments: (Lab Results      Component                Value               Date                      WBC                      7.1                 01/24/2021                HGB                      17.7 (H)            02/13/2021                HCT                      52.0 (H)            01/19/2021                MCV                      89.9                01/29/2021                PLT                      260                 02/13/2021           Lab Results      Component                Value  Date                      NA                       131 (L)             02/15/2021                K                        6.6 (HH)            02/13/2021         HEMOLYZED SAMPLE...REPEAT INTRAOP 3.1      CO2                      27                  01/18/2021                GLUCOSE                  205 (H)             01/17/2021                BUN                      20                  02/08/2021                CREATININE               0.50                01/26/2021                CALCIUM                  8.6 (L)             01/18/2021                GFRNONAA                 >60                 01/18/2021            ECHO 09/22:  1. Left ventricular ejection fraction, by estimation, is 20 to 25%. The  left ventricle has severely decreased function. The left ventricle  demonstrates global hypokinesis. The left ventricular internal cavity size  was severely dilated. There is moderate  left ventricular hypertrophy. Left ventricular diastolic parameters were  normal.  2. Right ventricular systolic function is normal. The right ventricular  size is normal.  3. Left atrial size was mild to moderately dilated.  4. Right atrial size was mild to moderately dilated.  5. The mitral valve is normal in structure. Moderate to severe mitral  valve regurgitation.  6. Tricuspid valve regurgitation is moderate.  7. The aortic valve is normal in structure. Aortic valve regurgitation is  not visualized.  )        Anesthesia Quick Evaluation

## 2021-02-12 NOTE — Progress Notes (Signed)
PHARMACIST CODE STROKE RESPONSE  Notified to mix TNK at 1846 by Dr. Thomasena Edis Delivered TNK to RN at 1849  TNK dose = 16 mg IV over 5 seconds  Issues/delays encountered (if applicable): CTA obtained prior to decision  Cathie Hoops 03-12-21 6:52 PM

## 2021-02-13 ENCOUNTER — Other Ambulatory Visit (HOSPITAL_COMMUNITY): Payer: Self-pay

## 2021-02-13 ENCOUNTER — Inpatient Hospital Stay (HOSPITAL_COMMUNITY): Payer: 59

## 2021-02-13 ENCOUNTER — Encounter (HOSPITAL_COMMUNITY): Payer: Self-pay | Admitting: Radiology

## 2021-02-13 DIAGNOSIS — I6389 Other cerebral infarction: Secondary | ICD-10-CM

## 2021-02-13 DIAGNOSIS — I5020 Unspecified systolic (congestive) heart failure: Secondary | ICD-10-CM | POA: Diagnosis present

## 2021-02-13 DIAGNOSIS — Z5189 Encounter for other specified aftercare: Secondary | ICD-10-CM

## 2021-02-13 DIAGNOSIS — Z978 Presence of other specified devices: Secondary | ICD-10-CM | POA: Diagnosis not present

## 2021-02-13 DIAGNOSIS — G4733 Obstructive sleep apnea (adult) (pediatric): Secondary | ICD-10-CM | POA: Diagnosis not present

## 2021-02-13 DIAGNOSIS — I639 Cerebral infarction, unspecified: Secondary | ICD-10-CM | POA: Diagnosis not present

## 2021-02-13 DIAGNOSIS — Z9282 Status post administration of tPA (rtPA) in a different facility within the last 24 hours prior to admission to current facility: Secondary | ICD-10-CM

## 2021-02-13 DIAGNOSIS — I513 Intracardiac thrombosis, not elsewhere classified: Secondary | ICD-10-CM

## 2021-02-13 LAB — HEMOGLOBIN A1C
Hgb A1c MFr Bld: 11.9 % — ABNORMAL HIGH (ref 4.8–5.6)
Mean Plasma Glucose: 294.83 mg/dL

## 2021-02-13 LAB — COMPREHENSIVE METABOLIC PANEL
ALT: 28 U/L (ref 0–44)
ALT: 29 U/L (ref 0–44)
AST: 27 U/L (ref 15–41)
AST: 46 U/L — ABNORMAL HIGH (ref 15–41)
Albumin: 2 g/dL — ABNORMAL LOW (ref 3.5–5.0)
Albumin: 2 g/dL — ABNORMAL LOW (ref 3.5–5.0)
Alkaline Phosphatase: 110 U/L (ref 38–126)
Alkaline Phosphatase: 113 U/L (ref 38–126)
Anion gap: 7 (ref 5–15)
Anion gap: 8 (ref 5–15)
BUN: 11 mg/dL (ref 6–20)
BUN: 9 mg/dL (ref 6–20)
CO2: 18 mmol/L — ABNORMAL LOW (ref 22–32)
CO2: 21 mmol/L — ABNORMAL LOW (ref 22–32)
Calcium: 7.2 mg/dL — ABNORMAL LOW (ref 8.9–10.3)
Calcium: 7.8 mg/dL — ABNORMAL LOW (ref 8.9–10.3)
Chloride: 108 mmol/L (ref 98–111)
Chloride: 108 mmol/L (ref 98–111)
Creatinine, Ser: 0.39 mg/dL — ABNORMAL LOW (ref 0.44–1.00)
Creatinine, Ser: 0.48 mg/dL (ref 0.44–1.00)
GFR, Estimated: >60 mL/min (ref >60)
Glucose, Bld: 322 mg/dL — ABNORMAL HIGH (ref 70–99)
Glucose, Bld: 96 mg/dL (ref 70–99)
Potassium: 2.9 mmol/L — ABNORMAL LOW (ref 3.5–5.1)
Potassium: 3.6 mmol/L (ref 3.5–5.1)
Sodium: 134 mmol/L — ABNORMAL LOW (ref 135–145)
Sodium: 136 mmol/L (ref 135–145)
Total Bilirubin: 1.2 mg/dL (ref 0.3–1.2)
Total Bilirubin: 1.5 mg/dL — ABNORMAL HIGH (ref 0.3–1.2)
Total Protein: 4.4 g/dL — ABNORMAL LOW (ref 6.5–8.1)
Total Protein: 4.6 g/dL — ABNORMAL LOW (ref 6.5–8.1)

## 2021-02-13 LAB — CBC
HCT: 43.1 % (ref 36.0–46.0)
Hemoglobin: 13.8 g/dL (ref 12.0–15.0)
MCH: 28.4 pg (ref 26.0–34.0)
MCHC: 32 g/dL (ref 30.0–36.0)
MCV: 88.7 fL (ref 80.0–100.0)
Platelets: 229 10*3/uL (ref 150–400)
RBC: 4.86 MIL/uL (ref 3.87–5.11)
RDW: 15.7 % — ABNORMAL HIGH (ref 11.5–15.5)
WBC: 10.8 10*3/uL — ABNORMAL HIGH (ref 4.0–10.5)
nRBC: 0 % (ref 0.0–0.2)

## 2021-02-13 LAB — PHOSPHORUS
Phosphorus: 3.2 mg/dL (ref 2.5–4.6)
Phosphorus: 3.3 mg/dL (ref 2.5–4.6)
Phosphorus: 3.7 mg/dL (ref 2.5–4.6)

## 2021-02-13 LAB — POCT I-STAT 7, (LYTES, BLD GAS, ICA,H+H)
Acid-base deficit: 3 mmol/L — ABNORMAL HIGH (ref 0.0–2.0)
Bicarbonate: 20.3 mmol/L (ref 20.0–28.0)
Calcium, Ion: 1.14 mmol/L — ABNORMAL LOW (ref 1.15–1.40)
HCT: 42 % (ref 36.0–46.0)
Hemoglobin: 14.3 g/dL (ref 12.0–15.0)
O2 Saturation: 98 %
Patient temperature: 98.7
Potassium: 4.2 mmol/L (ref 3.5–5.1)
Sodium: 138 mmol/L (ref 135–145)
TCO2: 21 mmol/L — ABNORMAL LOW (ref 22–32)
pCO2 arterial: 31.6 mmHg — ABNORMAL LOW (ref 32.0–48.0)
pH, Arterial: 7.417 (ref 7.350–7.450)
pO2, Arterial: 97 mmHg (ref 83.0–108.0)

## 2021-02-13 LAB — GLUCOSE, CAPILLARY
Glucose-Capillary: 120 mg/dL — ABNORMAL HIGH (ref 70–99)
Glucose-Capillary: 150 mg/dL — ABNORMAL HIGH (ref 70–99)
Glucose-Capillary: 159 mg/dL — ABNORMAL HIGH (ref 70–99)
Glucose-Capillary: 54 mg/dL — ABNORMAL LOW (ref 70–99)
Glucose-Capillary: 57 mg/dL — ABNORMAL LOW (ref 70–99)
Glucose-Capillary: 77 mg/dL (ref 70–99)
Glucose-Capillary: 78 mg/dL (ref 70–99)
Glucose-Capillary: 88 mg/dL (ref 70–99)

## 2021-02-13 LAB — MAGNESIUM
Magnesium: 1.5 mg/dL — ABNORMAL LOW (ref 1.7–2.4)
Magnesium: 2.3 mg/dL (ref 1.7–2.4)
Magnesium: 2 mg/dL (ref 1.7–2.4)

## 2021-02-13 LAB — ECHOCARDIOGRAM COMPLETE
AR max vel: 1.44 cm2
AV Peak grad: 3.1 mmHg
Ao pk vel: 0.88 m/s
Area-P 1/2: 5.93 cm2
Calc EF: 21.4 %
Height: 61 in
MV M vel: 3.04 m/s
MV Peak grad: 36.8 mmHg
S' Lateral: 5.5 cm
Single Plane A2C EF: 17.1 %
Single Plane A4C EF: 23.9 %
Weight: 2236.35 oz

## 2021-02-13 LAB — LIPID PANEL
Cholesterol: 240 mg/dL — ABNORMAL HIGH (ref 0–200)
HDL: 72 mg/dL (ref >40)
LDL Cholesterol: 151 mg/dL — ABNORMAL HIGH (ref 0–99)
Total CHOL/HDL Ratio: 3.3 RATIO
Triglycerides: 85 mg/dL (ref <150)
VLDL: 17 mg/dL (ref 0–40)

## 2021-02-13 LAB — MRSA NEXT GEN BY PCR, NASAL: MRSA by PCR Next Gen: NOT DETECTED

## 2021-02-13 LAB — ETHANOL: Alcohol, Ethyl (B): <10 mg/dL (ref <10)

## 2021-02-13 LAB — TRIGLYCERIDES: Triglycerides: 86 mg/dL (ref <150)

## 2021-02-13 MED ORDER — ATORVASTATIN CALCIUM 40 MG PO TABS
40.0000 mg | ORAL_TABLET | Freq: Every day | ORAL | Status: DC
Start: 2021-02-13 — End: 2021-02-13
  Administered 2021-02-13: 09:00:00 40 mg

## 2021-02-13 MED ORDER — HEPARIN (PORCINE) 25000 UT/250ML-% IV SOLN
1250.0000 [IU]/h | INTRAVENOUS | Status: DC
  Administered 2021-02-13: 18:00:00 750 [IU]/h via INTRAVENOUS
  Administered 2021-02-14 – 2021-02-15 (×2): 1200 [IU]/h via INTRAVENOUS
  Administered 2021-02-16 – 2021-02-20 (×5): 1150 [IU]/h via INTRAVENOUS
  Administered 2021-02-21 – 2021-02-22 (×2): 1200 [IU]/h via INTRAVENOUS
  Administered 2021-02-23 – 2021-02-24 (×2): 1250 [IU]/h via INTRAVENOUS
  Filled 2021-02-13 (×12): qty 250

## 2021-02-13 MED ORDER — PROPOFOL 1000 MG/100ML IV EMUL
INTRAVENOUS | Status: AC
  Administered 2021-02-13: 01:00:00 50 ug/kg/min via INTRAVENOUS
  Filled 2021-02-13: qty 100

## 2021-02-13 MED ORDER — SODIUM CHLORIDE 0.9 % IV SOLN
250.0000 mL | INTRAVENOUS | Status: DC
  Administered 2021-02-24 – 2021-03-03 (×3): 250 mL via INTRAVENOUS

## 2021-02-13 MED ORDER — ATORVASTATIN CALCIUM 80 MG PO TABS
80.0000 mg | ORAL_TABLET | Freq: Every day | ORAL | Status: DC
  Administered 2021-02-14 – 2021-03-09 (×24): 80 mg
  Filled 2021-02-13 (×24): qty 1

## 2021-02-13 MED ORDER — POTASSIUM CHLORIDE 20 MEQ PO PACK
40.0000 meq | PACK | Freq: Four times a day (QID) | ORAL | Status: AC
  Administered 2021-02-13 (×2): 40 meq
  Filled 2021-02-13: qty 2

## 2021-02-13 MED ORDER — DEXTROSE 50 % IV SOLN
INTRAVENOUS | Status: AC
  Filled 2021-02-13: qty 50

## 2021-02-13 MED ORDER — ADULT MULTIVITAMIN W/MINERALS CH
1.0000 | ORAL_TABLET | Freq: Every day | ORAL | Status: DC
  Administered 2021-02-13 – 2021-03-09 (×25): 1
  Filled 2021-02-13 (×25): qty 1

## 2021-02-13 MED ORDER — PERFLUTREN LIPID MICROSPHERE
1.0000 mL | INTRAVENOUS | Status: AC | PRN
Start: 2021-02-13 — End: 2021-02-13
  Administered 2021-02-13: 11:00:00 2 mL via INTRAVENOUS
  Filled 2021-02-13: qty 10

## 2021-02-13 MED ORDER — PROSOURCE TF PO LIQD: 45.0000 mL | Freq: Two times a day (BID) | ORAL | Status: DC

## 2021-02-13 MED ORDER — MAGNESIUM SULFATE 2 GM/50ML IV SOLN
2.0000 g | Freq: Once | INTRAVENOUS | Status: AC
  Administered 2021-02-13: 09:00:00 2 g via INTRAVENOUS
  Filled 2021-02-13: qty 50

## 2021-02-13 MED ORDER — POTASSIUM CHLORIDE 20 MEQ PO PACK
40.0000 meq | PACK | Freq: Four times a day (QID) | ORAL | Status: DC
  Filled 2021-02-13: qty 2

## 2021-02-13 MED ORDER — NOREPINEPHRINE 4 MG/250ML-% IV SOLN: 0.0000 ug/min | INTRAVENOUS | Status: DC

## 2021-02-13 MED ORDER — PANTOPRAZOLE 2 MG/ML SUSPENSION
40.0000 mg | Freq: Every day | ORAL | Status: DC
  Administered 2021-02-13 – 2021-03-09 (×25): 40 mg
  Filled 2021-02-13 (×25): qty 20

## 2021-02-13 MED ORDER — GLUCERNA 1.5 CAL PO LIQD
1000.0000 mL | ORAL | Status: DC
  Administered 2021-02-13 – 2021-03-04 (×13): 1000 mL
  Filled 2021-02-13 (×30): qty 1000

## 2021-02-13 MED ORDER — DEXTROSE 50 % IV SOLN
12.5000 g | Freq: Once | INTRAVENOUS | Status: AC
  Administered 2021-02-13: 09:00:00 12.5 g via INTRAVENOUS

## 2021-02-13 MED ORDER — TICAGRELOR 90 MG PO TABS
90.0000 mg | ORAL_TABLET | Freq: Two times a day (BID) | ORAL | Status: DC
  Administered 2021-02-13 – 2021-03-09 (×50): 90 mg
  Filled 2021-02-13 (×50): qty 1

## 2021-02-13 MED ORDER — NOREPINEPHRINE 4 MG/250ML-% IV SOLN
2.0000 ug/min | INTRAVENOUS | Status: DC
  Administered 2021-02-13: 15:00:00 2 ug/min via INTRAVENOUS
  Administered 2021-02-15: 09:00:00 4 ug/min via INTRAVENOUS
  Filled 2021-02-13 (×3): qty 250

## 2021-02-13 MED ORDER — DEXTROSE 50 % IV SOLN
1.0000 | Freq: Once | INTRAVENOUS | Status: AC
Start: 2021-02-13 — End: 2021-02-13
  Administered 2021-02-13: 12:00:00 50 mL via INTRAVENOUS

## 2021-02-13 MED ORDER — VITAL HIGH PROTEIN PO LIQD: 1000.0000 mL | ORAL | Status: DC

## 2021-02-13 MED ORDER — ASPIRIN EC 81 MG PO TBEC: 81.0000 mg | DELAYED_RELEASE_TABLET | Freq: Once | ORAL | Status: DC

## 2021-02-13 MED ORDER — PROSOURCE TF PO LIQD
45.0000 mL | Freq: Every day | ORAL | Status: DC
  Administered 2021-02-13 – 2021-03-09 (×25): 45 mL
  Filled 2021-02-13 (×24): qty 45

## 2021-02-13 NOTE — Progress Notes (Signed)
Inpatient Diabetes Program Recommendations  AACE/ADA: New Consensus Statement on Inpatient Glycemic Control (2015)  Target Ranges:  Prepandial:   less than 140 mg/dL      Peak postprandial:   less than 180 mg/dL (1-2 hours)      Critically ill patients:  140 - 180 mg/dL   Lab Results  Component Value Date   GLUCAP 120 (H) 02/13/2021   HGBA1C 11.9 (H) 02/13/2021    Review of Glycemic Control  Latest Reference Range & Units 02/15/2021 18:21 01/23/2021 23:42 02/13/21 03:18 02/13/21 08:19 02/13/21 08:47  Glucose-Capillary 70 - 99 mg/dL 568 (H) 127 (H) Novolog 11 units 159 (H) Novolog 3 units 57 (L) 120 (H)  (H): Data is abnormally high (L): Data is abnormally low Diabetes history: DM2 Outpatient Diabetes medications: Levemir 17 units bid, Jardiance 25, Amaryl 4 mg bid, Metformin 1 gm bid Current orders for Inpatient glycemic control: Novolog correction 0-15 units q 4 hrs.  Inpatient Diabetes Program Recommendations:   Patient had hypoglycemia of 57 post correction. Please consider: -Decrease Novolog correction to 0-9 sensitive scale q 4 hrs.  Thank you, Billy Fischer. Carlyne Keehan, RN, MSN, CDE  Diabetes Coordinator Inpatient Glycemic Control Team Team Pager [phone] (8am-5pm) 02/13/2021 11:20 AM

## 2021-02-13 NOTE — Progress Notes (Signed)
Initial Nutrition Assessment  DOCUMENTATION CODES:   Not applicable  INTERVENTION:   Initiate tube feeding via OG tube: Glucerna 1.5 at 45 ml/h (1080 ml per day) Prosource TF 45 ml daily  Provides 1660 kcal, 100 gm protein, 820 ml free water daily  Propofol providing additional kcal from lipid  MVI with minerals daily   NUTRITION DIAGNOSIS:   Inadequate oral intake related to inability to eat as evidenced by NPO status.  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   Pt remains on vent post IR procedure, starting TF today. Noted elevated A1C on admission indicating uncontrolled DM. Per chart review pt has had weight loss.  MAP > 77, off all pressors   12/27 s/p TNK and IR   Patient is currently intubated on ventilator support MV: 9.1 L/min Temp (24hrs), Avg:97.9 F (36.6 C), Min:96.9 F (36.1 C), Max:98.9 F (37.2 C)  Propofol: 3 ml/hr provides: 79 kcal   Medications reviewed and include: colace, SSI, protonix, miralax NS @ 50 ml/hr  Labs reviewed: A1C: 11.9 CBG's: 54-349  OG tube: tip in gastric fundus per xray    NUTRITION - FOCUSED PHYSICAL EXAM:  Remote  Diet Order:   Diet Order             Diet NPO time specified  Diet effective now                   EDUCATION NEEDS:   No education needs have been identified at this time  Skin:  Skin Assessment: Reviewed RN Assessment  Last BM:  12/28 large  Height:   Ht Readings from Last 1 Encounters:  01/24/2021 5\' 1"  (1.549 m)    Weight:   Wt Readings from Last 1 Encounters:  02/11/2021 63.4 kg    Ideal Body Weight:     BMI:  Body mass index is 26.41 kg/m.  Estimated Nutritional Needs:   Kcal:  1600  Protein:   85-100 grams  Fluid:  > 1.6 L/day  02/14/21., RD, LDN, CNSC See [facility] for contact information

## 2021-02-13 NOTE — Progress Notes (Signed)
ANTICOAGULATION CONSULT NOTE - Initial Consult  Pharmacy Consult for IV Heparin Indication:  LV thrombus, CVA  No Known Allergies  Patient Measurements: Height: _0  (154.9 cm) Weight: 63.4 kg (139 lb 12.4 oz) IBW/kg (Calculated) : 47.8 Heparin Dosing Weight: 60.8 kg  Vital Signs: Temp: 98.7 F (37.1 C) (12/28 1600) Temp Source: Oral (12/28 1600) BP: 112/72 (12/28 1700) Pulse Rate: 85 (12/28 1700)  Labs: Recent Labs    02/05/2021 1823 02/03/2021 1831 01/17/2021 1940 02/04/2021 2341 02/13/21 0259 02/13/21 0533  HGB 15.9*   < > 16.0*  --  14.3 13.8  HCT 49.7*   < > 47.0*  --  42.0 43.1  PLT 260  --   --   --   --  229  APTT 25  --   --   --   --   --   LABPROT 12.3  --   --   --   --   --   INR 0.9  --   --   --   --   --   CREATININE  --    < > 0.30* 0.48  --  0.39*   < > = values in this interval not displayed.    Estimated Creatinine Clearance: 66.9 mL/min (A) (by C-G formula based on SCr of 0.39 mg/dL (L)).   Medical History: Past Medical History:  Diagnosis Date   CHF (congestive heart failure) ([place])    Diabetes mellitus without complication ([place])    Obstructive sleep apnea     Medications:  Scheduled:    stroke: mapping our early stages of recovery book   Does not apply Once   [START ON 02/14/2021] atorvastatin  80 mg Per Tube Daily   chlorhexidine gluconate (MEDLINE KIT)  15 mL Mouth Rinse BID   Chlorhexidine Gluconate Cloth  6 each Topical Q0600   docusate  100 mg Per Tube BID   feeding supplement (PROSource TF)  45 mL Per Tube Daily   insulin aspart  0-15 Units Subcutaneous Q4H   mouth rinse  15 mL Mouth Rinse 10 times per day   multivitamin with minerals  1 tablet Per Tube Daily   pantoprazole sodium  40 mg Per Tube QHS   polyethylene glycol  17 g Per Tube Daily   sodium chloride flush  3 mL Intravenous Once   ticagrelor  90 mg Per Tube BID   Infusions:   sodium chloride 50 mL/hr at 02/13/21 1600   sodium chloride     feeding supplement (GLUCERNA  1.5 CAL) 1,000 mL (02/13/21 1445)   norepinephrine (LEVOPHED) Adult infusion 1 mcg/min (02/13/21 1600)   propofol (DIPRIVAN) infusion 15 mcg/kg/min (02/13/21 1600)    Assessment: 55 years of age female with CVA and LV thrombus. Pharmacy consulted to start IV heparin per stroke protocol with low goal of 0.3 to 0.5, no bolus.   Patient received TNKase for stroke on 12/27 at 1900 PM.  CBC within normal limits. No bleeding noted.  Patient currently on Brilinta for anti-platelet therapy.   Goal of Therapy:  Heparin level 0.3 to 0.5 units/ml Monitor platelets by anticoagulation protocol: Yes   Plan:  Start IV heparin at 750 units/hr. Heparin level in 6 hours.  Daily Heparin level and CBC while on therapy.   Sloan Leiter, PharmD, BCPS, BCCCP Clinical Pharmacist Please refer to [facility] for [place] numbers 02/13/2021,6:04 PM

## 2021-02-13 NOTE — Progress Notes (Addendum)
NAME:  Taylor [name], MRN:  [id number], DOB:  June 06, 1965, LOS: 1 ADMISSION DATE:  02/09/2021, CONSULTATION DATE:  12/27 REFERRING MD:  Dr Tennis Must Sindy Messing , CHIEF COMPLAINT:   CVA  History of Present Illness:  Patient is encephalopathic and/or intubated. Therefore history has been obtained from chart review. 55 year old female with PMH as below, which is significant for HFrEF with LVEF 20-25 % (2017), DM, and OSA on CPAP. 12/27 she presented to [facility] ED with complaints of acute onset left sided weakness and slurred speech. EMS noted non-verbal and right gaze preference. LKW 1700 at which time she fell walking to the restroom. She presented to [facility] as a code stroke. Imaging consistent with occlusion of the distal basilar arterty, right P1, and proximal R P2. She was given systemic TNK and was taken to IR for angiography and possible thrombectomy. She underwent mechanical thrombectomy performed with stent retriever on the right with complete recanalization after one pass (TICI3). Mechanical thrombectomy performed on the left with stent retriever with multiple passes performed due to recurrent occlusion. Complete recanalization of the left PCA achieved. Post op she was sent to the ICU on vent for recovery. PCCM consulted for vent management and ICU care.    Pertinent  Medical History   has a past medical history of CHF (congestive heart failure) ([place]), Diabetes mellitus without complication ([place]), and Obstructive sleep apnea.   Significant Hospital Events: Including procedures, antibiotic start and stop dates in addition to other pertinent events   12/27 admit for posterior circulation stroke. TNK given. To IR. TICI 3.   Interim History / Subjective:    Objective   Blood pressure 109/78, pulse 88, temperature (!) 96.9 F (36.1 C), temperature source Axillary, resp. rate (!) 22, height 5\' 1"  (1.549 m), weight 63.4 kg, SpO2 100 %.    Vent Mode: PRVC FiO2 (%):  [40 %] 40 % Set Rate:  [18  bmp] 18 bmp Vt Set:  [380 mL-400 mL] 380 mL PEEP:  [5 cmH20] 5 cmH20 Plateau Pressure:  [17 S192499 cmH20] 17 cmH20   Intake/Output Summary (Last 24 hours) at 02/13/2021 0841 Last data filed at 02/13/2021 0800 Gross per 24 hour  Intake 1939.8 ml  Output 200 ml  Net 1739.8 ml   Filed Weights   02/05/2021 1827 02/14/2021 2007  Weight: 63.4 kg 63.4 kg    Examination: General: Middle aged female on vent HENT: [place]/AT, PERRL, no JVD Lungs: Clear bilateral breath sounds Cardiovascular: RRR, no MRG Abdomen: protuberant, soft, hypoactive Extremities: No acute deformity Neuro: Sedated, paralytic effects persist  Resolved Hospital Problem list     Assessment & Plan:  CVA: basilar, bilateral PI/PCA.  TNK given in ED and she is s/p mechanical thrombectomy on the right and mechanical thrombectomy with stent placement on the L.  Echo MRI is pending  Acute resp failure, inability to protect airway due to stroke OSA on CPAP Continue vent support Start SBT's as tolerated Follow intermittent chest x-ray Will need nocturnal CPAP after extubation  Chronic HFrEF: LVEF 20-25% in 2017.  Follow echocardiogram.  Hypomagnesemia Replete lytes  DM SSI coverage - holding home glimepiride, levemir, metformin   Best Practice (right click and "Reselect all SmartList Selections" daily)   Diet/type: NPO DVT prophylaxis: not indicated GI prophylaxis: PPI Lines: Arterial Line Foley:  N/A Code Status:  full code Last date of multidisciplinary goals of care discussion [ ]   Critical care time:    The patient is critically ill with  multiple organ system failure and requires high complexity decision making for assessment and support, frequent evaluation and titration of therapies, advanced monitoring, review of radiographic studies and interpretation of complex data.   Critical Care Time devoted to patient care services, exclusive of separately billable procedures, described in this note is 45  minutes.   Chilton Greathouse MD [facility] See Amion for pager  If no response to pager , please call [phone] until 7pm After 7:00 pm call Elink  [phone] 02/13/2021, 8:42 AM      ,

## 2021-02-13 NOTE — Progress Notes (Signed)
SLP Cancellation Note  Patient Details Name: Taylor [name] MRN: [id number] DOB: 25-May-1965   Cancelled treatment:       Reason Eval/Treat Not Completed: Patient not medically ready. Will f/u for readiness.    Tahjay Binion, Riley Nearing 02/13/2021, 7:18 AM

## 2021-02-13 NOTE — Progress Notes (Signed)
Patient transported to MRI and back without complications. RN at bedside. 

## 2021-02-13 NOTE — Progress Notes (Signed)
Pt transported from 4N21 to CT and back with no complications.  

## 2021-02-13 NOTE — Progress Notes (Signed)
Notified Dr. Roda Shutters that patient was hypotensive 91/66 (74) at 1500.  Was given orders to start levophed and titrate for sbp > 110

## 2021-02-13 NOTE — Progress Notes (Signed)
PT Cancellation Note  Patient Details Name: Taylor [name] MRN: [id number] DOB: May 09, 1965   Cancelled Treatment:    Reason Eval/Treat Not Completed: Active bedrest order Will evaluate once activity orders are updated.   Sergi Gellner A. Dan Humphreys PT, DPT Acute Rehabilitation Services Pager [phone] Office [phone]    Viviann Spare 02/13/2021, 7:58 AM

## 2021-02-13 NOTE — Anesthesia Postprocedure Evaluation (Signed)
Anesthesia Post Note  Patient: Taylor [name]  Procedure(s) Performed: IR WITH ANESTHESIA     Patient location during evaluation: SICU Anesthesia Type: General Level of consciousness: sedated Pain management: pain level controlled Vital Signs Assessment: post-procedure vital signs reviewed and stable Respiratory status: patient remains intubated per anesthesia plan Cardiovascular status: stable Postop Assessment: no apparent nausea or vomiting Anesthetic complications: no   No notable events documented.  Last Vitals:  Vitals:   02/13/21 0530 02/13/21 0600  BP: 118/73 (!) 131/92  Pulse: 82 88  Resp: (!) 31 20  Temp:    SpO2: 100% 100%    Last Pain:  Vitals:   02/13/21 0400  TempSrc: Oral                 Earl Lites P Annai Heick

## 2021-02-13 NOTE — Progress Notes (Signed)
OT Cancellation Note  Patient Details Name: Taylor [name] MRN: [id number] DOB: 21-Mar-1965   Cancelled Treatment:    Reason Eval/Treat Not Completed: Patient not medically ready Flat after IR and waiting increased activity orders.   Wynona Neat, OTR/L  Acute Rehabilitation Services Pager: [phone] Office: [phone] .  02/13/2021, 10:41 AM

## 2021-02-13 NOTE — Progress Notes (Signed)
Referring Physician(s): CODE STROKE  Supervising Physician: Pedro Earls  Patient Status:  [facility] - In-pt  Chief Complaint: Bilateral P1/PCA occlusions s/p intervention in NIR 02/07/2021 - Mechanical thrombectomies on the right and left. Left P1-P2/PCA stent placement.   Subjective: Ventilator/sedated. Awake enough to follow some commands.   Allergies: Patient has no known allergies.  Medications: Prior to Admission medications   Medication Sig Start Date End Date Taking? Authorizing Provider  aspirin EC 81 MG EC tablet Take 1 tablet (81 mg total) by mouth daily. 09/04/18   Gouru, Illene Silver, MD  atorvastatin (LIPITOR) 40 MG tablet Take 1 tablet (40 mg total) by mouth daily. 03/10/18   Alisa Graff, FNP  empagliflozin (JARDIANCE) 25 MG TABS tablet Take 1 tablet (25 mg total) by mouth daily. 11/30/20   Alisa Graff, FNP  furosemide (LASIX) 20 MG tablet Take 1 tablet (20 mg total) by mouth daily. Weight gain 2 pounds overnight, 5 pounds in a week, shortness of breath, or leg swelling 10/24/20   Lorella Nimrod, MD  glimepiride (AMARYL) 2 MG tablet Take 4 mg by mouth 2 (two) times daily. 02/15/15   [provider]  insulin detemir (LEVEMIR) 100 unit/ml SOLN Inject 17 Units into the skin 2 (two) times daily.     [provider]  lisinopril (ZESTRIL) 2.5 MG tablet Take 1 tablet (2.5 mg total) by mouth daily. 11/30/20   Alisa Graff, FNP  metFORMIN (GLUCOPHAGE) 1000 MG tablet Take 1 tablet (1,000 mg total) 2 (two) times daily with a meal by mouth. 12/31/16   Alisa Graff, FNP  potassium chloride SA (KLOR-CON) 20 MEQ tablet On day 1, you will take 2 tablets in the AM and 2 tablets in the PM. Starting day 2, you will take 1 tablet every day 01/14/21   Alisa Graff, FNP     Vital Signs: BP 109/77    Pulse 83    Temp (!) 97.4 F (36.3 C) (Oral)    Resp (!) 22    Ht 5\' 1"  (1.549 m)    Wt 139 lb 12.4 oz (63.4 kg)    SpO2 100%    BMI 26.41 kg/m   Physical  Exam Constitutional:      General: She is not in acute distress.    Appearance: She is not ill-appearing.  Cardiovascular:     Rate and Rhythm: Normal rate and regular rhythm.     Comments: Right groin vascular site is soft and dry.  Pulmonary:     Comments: intubated Neurological:     Comments: Able to move all extremities, weaker on the left.     Imaging: DG Abd 1 View  Result Date: 02/13/2021 CLINICAL DATA:  Enteric catheter placement EXAM: ABDOMEN - 1 VIEW COMPARISON:  None. FINDINGS: Frontal view of the lower chest and upper abdomen demonstrates enteric catheter passing below diaphragm, coiled over the gastric body. Tip and side port project over the gastric fundus. Bowel gas pattern is unremarkable. Excreted contrast within the kidneys, ureters, and bladder from previous contrasted evaluation. IMPRESSION: 1. Enteric catheter tip projecting over gastric fundus. Electronically Signed   By: Randa Ngo M.D.   On: 02/13/2021 01:34   CT HEAD WO CONTRAST (5MM)  Result Date: 02/13/2021 CLINICAL DATA:  Stroke follow-up EXAM: CT HEAD WITHOUT CONTRAST TECHNIQUE: Contiguous axial images were obtained from the base of the skull through the vertex without intravenous contrast. COMPARISON:  01/31/2021 at 6:30 p.m. FINDINGS: Brain: Focal hyperattenuation in  the ventral right thalamus. There is an old infarct of the left frontal lobe. There is periventricular hypoattenuation compatible with chronic microvascular disease. Vascular: Atherosclerotic calcification of the internal carotid arteries at the skull base. No abnormal hyperdensity of the major intracranial arteries or dural venous sinuses. Skull: The visualized skull base, calvarium and extracranial soft tissues are normal. Sinuses/Orbits: No fluid levels or advanced mucosal thickening of the visualized paranasal sinuses. No mastoid or middle ear effusion. The orbits are normal. IMPRESSION: 1. Focal hyperattenuation in the ventral right thalamus,  likely contrast material. 2. 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 Electronically Signed   By: Deatra Robinson M.D.   On: 02/13/2021 00:33   ECHOCARDIOGRAM COMPLETE  Result Date: 02/13/2021    ECHOCARDIOGRAM REPORT   Patient Name:   Taylor [name] Date of Exam: 02/13/2021 Medical Rec #:  [id number]    Height:       61.0 in Accession #:    [id number]   Weight:       139.8 lb Date of Birth:  11/17/1965    BSA:          1.622 m Patient Age:    55 years     BP:           115/74 mmHg Patient Gender: F            HR:           84 bpm. Exam Location:  Inpatient Procedure: 2D Echo, Color Doppler, Cardiac Doppler and Intracardiac            Opacification Agent Indications:    Stroke  History:        Patient has prior history of Echocardiogram examinations. CHF;                 Risk Factors:Diabetes and Sleep Apnea.  Sonographer:    Cleatis Polka Referring Phys: [id number] JINDONG XU IMPRESSIONS  1. Global hypokinesis with inferior akinesis. Inferoapical thrombus measuring 2.4 cm x 1.6 cm. Anteroapical thrombus measuring 1.1 x 1.1 cm. Left ventricular ejection fraction, by estimation, is 20 to 25%. Left ventricular ejection fraction by 3D volume  is 24 %. Left ventricular ejection fraction by 2D MOD biplane is 21.4 %. Left ventricular ejection fraction by PLAX is 18 %. The left ventricle has severely decreased function. The left ventricle demonstrates global hypokinesis. The left ventricular internal cavity size was severely dilated. Left ventricular diastolic parameters are consistent with Grade III diastolic dysfunction (restrictive). Elevated left ventricular end-diastolic pressure.  2. Right ventricular systolic function is moderately reduced. The right ventricular size is normal. There is moderately elevated pulmonary artery systolic pressure.  3. Left atrial size was severely dilated.  4. The mitral valve is normal in structure. Mild mitral valve regurgitation. No evidence of mitral  stenosis.  5. Tricuspid valve regurgitation is moderate.  6. The aortic valve is tricuspid. Aortic valve regurgitation is not visualized. No aortic stenosis is present.  7. The inferior vena cava is dilated in size with <50% respiratory variability, suggesting right atrial pressure of 15 mmHg. Comparison(s): Apical thrombi are new compared with echo 10/2020. FINDINGS  Left Ventricle: Global hypokinesis with inferior akinesis. Inferoapical thrombus measuring 2.4 cm x 1.6 cm. Anteroapical thrombus measuring 1.1 x 1.1 cm. Left ventricular ejection fraction, by estimation, is 20 to 25%. Left ventricular ejection fraction  by PLAX is 18 %. Left ventricular ejection fraction by 2D MOD biplane is 21.4 %. Left ventricular ejection fraction  by 3D volume is 24 %. The left ventricle has severely decreased function. The left ventricle demonstrates global hypokinesis. The left ventricular internal cavity size was severely dilated. There is no left ventricular hypertrophy. Left ventricular diastolic parameters are consistent with Grade III diastolic dysfunction (restrictive). Elevated left ventricular end-diastolic pressure. Right Ventricle: The right ventricular size is normal. No increase in right ventricular wall thickness. Right ventricular systolic function is moderately reduced. There is moderately elevated pulmonary artery systolic pressure. The tricuspid regurgitant velocity is 2.75 m/s, and with an assumed right atrial pressure of 15 mmHg, the estimated right ventricular systolic pressure is 123456 mmHg. Left Atrium: Left atrial size was severely dilated. Right Atrium: Right atrial size was normal in size. Pericardium: Trivial pericardial effusion is present. Mitral Valve: The mitral valve is normal in structure. Mild mitral valve regurgitation. No evidence of mitral valve stenosis. Tricuspid Valve: The tricuspid valve is normal in structure. Tricuspid valve regurgitation is moderate . No evidence of tricuspid stenosis.  Aortic Valve: The aortic valve is tricuspid. Aortic valve regurgitation is not visualized. No aortic stenosis is present. Aortic valve peak gradient measures 3.1 mmHg. Pulmonic Valve: The pulmonic valve was normal in structure. Pulmonic valve regurgitation is mild to moderate. No evidence of pulmonic stenosis. Aorta: The aortic root is normal in size and structure. Venous: The inferior vena cava is dilated in size with less than 50% respiratory variability, suggesting right atrial pressure of 15 mmHg. IAS/Shunts: No atrial level shunt detected by color flow Doppler.  LEFT VENTRICLE PLAX 2D                        Biplane EF (MOD) LV EF:         Left            LV Biplane EF:   Left                ventricular                      ventricular                ejection                         ejection                fraction by                      fraction by                PLAX is 18                       2D MOD                %.                               biplane is LVIDd:         6.00 cm                          21.4 %. LVIDs:         5.50 cm LV PW:         0.90 cm         Diastology LV IVS:  0.80 cm         LV e' medial:    2.84 cm/s LVOT diam:     2.00 cm         LV E/e' medial:  23.9 LV SV:         17              LV e' lateral:   2.84 cm/s LV SV Index:   11              LV E/e' lateral: 23.9 LVOT Area:     3.14 cm                                 3D Volume EF LV Volumes (MOD)               LV 3D EF:    Left LV vol d, MOD    222.0 ml                   ventricul A2C:                                        ar LV vol d, MOD    188.0 ml                   ejection A4C:                                        fraction LV vol s, MOD    184.0 ml                   by 3D A2C:                                        volume is LV vol s, MOD    143.0 ml                   24 %. A4C: LV SV MOD A2C:   38.0 ml LV SV MOD A4C:   188.0 ml      3D Volume EF: LV SV MOD BP:    46.1 ml       3D EF:        24 %                                 LV EDV:       223 ml                                LV ESV:       170 ml                                LV SV:        53 ml RIGHT VENTRICLE            IVC RV Basal diam:  4.30 cm    IVC diam: 2.50  cm RV Mid diam:    3.10 cm RV S prime:     6.81 cm/s TAPSE (M-mode): 1.1 cm LEFT ATRIUM             Index        RIGHT ATRIUM           Index LA diam:        4.40 cm 2.71 cm/m   RA Area:     15.80 cm LA Vol (A2C):   78.6 ml 48.46 ml/m  RA Volume:   42.40 ml  26.14 ml/m LA Vol (A4C):   59.5 ml 36.68 ml/m LA Biplane Vol: 70.4 ml 43.40 ml/m  AORTIC VALVE AV Area (Vmax): 1.44 cm AV Vmax:        87.60 cm/s AV Peak Grad:   3.1 mmHg LVOT Vmax:      40.20 cm/s LVOT Vmean:     26.800 cm/s LVOT VTI:       0.055 m  AORTA Ao Root diam: 2.30 cm Ao Asc diam:  2.80 cm MITRAL VALVE               TRICUSPID VALVE MV Area (PHT): 5.93 cm    TR Peak grad:   30.2 mmHg MV Decel Time: 128 msec    TR Vmax:        275.00 cm/s MR Peak grad: 36.8 mmHg MR Vmax:      303.50 cm/s  SHUNTS MV E velocity: 67.90 cm/s  Systemic VTI:  0.06 m MV A velocity: 20.90 cm/s  Systemic Diam: 2.00 cm MV E/A ratio:  3.25 Skeet Latch MD Electronically signed by Skeet Latch MD Signature Date/Time: 02/13/2021/11:45:04 AM    Final    CT HEAD CODE STROKE WO CONTRAST  Result Date: 02/15/2021 CLINICAL DATA:  Code stroke. EXAM: CT HEAD WITHOUT CONTRAST TECHNIQUE: Contiguous axial images were obtained from the base of the skull through the vertex without intravenous contrast. COMPARISON:  10/21/2020 FINDINGS: Brain: No evidence of acute infarction, hemorrhage, cerebral edema, mass, mass effect, or midline shift. Ventricles and sulci are normal for age. No extra-axial fluid collection. Redemonstrated chronic hypodensity in the left frontal lobe and right caudate head. Vascular: No hyperdense vessel or unexpected calcification. Skull: Normal. Negative for fracture or focal lesion. Sinuses/Orbits: No acute finding. Other: The mastoid air cells are  well aerated. ASPECTS [facility] Stroke Program Early CT Score) - Ganglionic level infarction (caudate, lentiform nuclei, internal capsule, insula, M1-M3 cortex): 7 - Supraganglionic infarction (M4-M6 cortex): 3 Total score (0-10 with 10 being normal): 10 IMPRESSION: 1. No acute intracranial process. 2. ASPECTS is 10 Code stroke imaging results were communicated on 01/18/2021 at 6:41 pm to provider [facility] via secure text paging. Electronically Signed   By: Merilyn Baba M.D.   On: 02/11/2021 18:41   CT ANGIO HEAD NECK W WO CM (CODE STROKE)  Result Date: 02/13/2021 CLINICAL DATA:  Stroke code, neuro deficit, acute EXAM: CT ANGIOGRAPHY HEAD AND NECK TECHNIQUE: Multidetector CT imaging of the head and neck was performed using the standard protocol during bolus administration of intravenous contrast. Multiplanar CT image reconstructions and MIPs were obtained to evaluate the vascular anatomy. Carotid stenosis measurements (when applicable) are obtained utilizing NASCET criteria, using the distal internal carotid diameter as the denominator. CONTRAST:  60mL OMNIPAQUE IOHEXOL 350 MG/ML SOLN COMPARISON:  No prior CTA, correlation is made with CT head 01/23/2021 and MR head 10/21/2020 FINDINGS: CT HEAD FINDINGS For noncontrast findings, please see same day CT head. CTA NECK  FINDINGS Aortic arch: Standard branching. Imaged portion shows no evidence of aneurysm or dissection. No significant stenosis of the major arch vessel origins. Aortic atherosclerosis. Right carotid system: No evidence of dissection, stenosis (50% or greater) or occlusion. Left carotid system: No evidence of dissection, stenosis (50% or greater) or occlusion. Vertebral arteries: Codominant. No evidence of dissection, stenosis (50% or greater) or occlusion. Skeleton: No acute osseous abnormality. Other neck: Negative. Upper chest: Right pleural effusion. No left pleural effusion. Hazy opacities, possibly pulmonary edema. Review of the MIP images  confirms the above findings CTA HEAD FINDINGS Anterior circulation: Both internal carotid arteries are patent to the termini, mild calcifications in the cavernous segment and more significant calcifications in the right-greater-than-left supraclinoid segments, which cause moderate narrowing. Focal stenosis in the right A1 origin (series 7, image 104). Diminutive but patent left A1. Normal anterior communicating artery. Focal poor opacification of the right A3 (series 7, image 90) anterior cerebral arteries are otherwise patent to their distal aspects. No significant M1 stenosis or occlusion. Normal MCA bifurcations. Distal MCA branches perfused and symmetric. Posterior circulation: Poor opacification of the right vertebral artery, after the right PICA takeoff, with only intermittent flow proximally and likely retrograde flow distally (series 7, image 127). The left vertebral artery is somewhat irregular but patent. Posterior inferior cerebral arteries patent bilaterally. The basilar artery is mildly irregular proximally and poorly opacified in the mid basilar (series 7, image 112), not definitively seen at its distal aspect. The left posterior communicating artery is patent and likely supplies the left P1 segment, which also likely retrograde supplies the left superior cerebellar artery. The left PCA is patent to its distal aspect. The right superior cerebellar artery and right P1 segment are non-opacified. There is distal reconstitution of the right PCA (series 7, image 100), likely secondary to collaterals. Venous sinuses: As permitted by contrast timing, patent. Anatomic variants: None significant Review of the MIP images confirms the above findings IMPRESSION: 1. Non opacification of the distal basilar artery, right P1 and proximal P2, and right SCA, of indeterminate acuity. The distal right P2 reconstitutes, possibly secondary to collaterals. The left PCA is supplied by the left posterior communicating artery,  with retrograde flow likely supplying the left superior cerebellar artery. 2. Poor opacification of the right vertebral artery, with only intermittent flow from the right PICA takeoff to the vertebrobasilar junction; distal right vertebral artery flow is favored to be retrograde. 3. Focal stenosis in the right A1 and A3. 4. Moderate narrowing of the right-greater-than-left supraclinoid ICA. 5. No hemodynamically significant stenosis in the neck. Code stroke imaging results were communicated on 01/22/2021 at 7:14 pm to provider [facility] via secure text paging. Electronically Signed   By: Merilyn Baba M.D.   On: 01/30/2021 19:16    Labs:  CBC: Recent Labs    10/21/20 1057 10/23/20 0409 01/18/2021 1823 01/30/2021 1831 01/23/2021 1940 02/13/21 0259 02/13/21 0533  WBC 10.2 9.5 7.1  --   --   --  10.8*  HGB 15.3* 15.3* 15.9* 17.7* 16.0* 14.3 13.8  HCT 47.1* 48.4* 49.7* 52.0* 47.0* 42.0 43.1  PLT 283 291 260  --   --   --  229    COAGS: Recent Labs    01/29/2021 1823  INR 0.9  APTT 25    BMP: Recent Labs    01/14/21 1511 01/18/21 1111 01/30/2021 1831 02/01/2021 1940 02/08/2021 2341 02/13/21 0259 02/13/21 0533  NA 134* 137 131* 136 134* 138 136  K 2.7* 4.1 6.6*  3.1* 2.9* 4.2 3.6  CL 97* 100 101 99 108  --  108  CO2 28 27  --   --  18*  --  21*  GLUCOSE 243* 157* 205* 172* 322*  --  96  BUN 10 10 20 12 11   --  9  CALCIUM 8.2* 8.6*  --   --  7.2*  --  7.8*  CREATININE 0.45 0.55 0.50 0.30* 0.48  --  0.39*  GFRNONAA >60 >60  --   --  NOT CALCULATED  --  >60    LIVER FUNCTION TESTS: Recent Labs    10/21/20 1057 01/31/2021 2341 02/13/21 0533  BILITOT 1.5* 1.5* 1.2  AST 14* 46* 27  ALT 12 29 28   ALKPHOS 108 110 113  PROT 6.1* 4.4* 4.6*  ALBUMIN 2.8* 2.0* 2.0*    Assessment and Plan:  Bilateral P1/PCA occlusions s/p intervention in NIR 01/17/2021 - Mechanical thrombectomies on the right and left. Left P1-P2/PCA stent placement.  CT completed this morning with faint blush observed in  the right thalamus - most consistent with contrast extravasation per note written by Dr. Cheral Marker today. Patient was loaded with ASA and Brilinta with cangrelor discontinued two hours later.   Patient remains intubated with sedation being weaned. Patient with pending MRI this afternoon.   NIR recommends to continue daily DAPT. Other plans per Critical Care/Neurology. NIR will continue to follow.   Electronically Signed: Soyla Dryer, AGACNP-BC [phone] 02/13/2021, 1:44 PM   I spent a total of 15 Minutes at the the patient's bedside AND on the patient's hospital floor or unit, greater than 50% of which was counseling/coordinating care for Code stroke; thrombectomies with stent placement

## 2021-02-13 NOTE — Progress Notes (Signed)
°  Transition of Care [facility]) Screening Note   Patient Details  Name: TORIE TOWLE Date of Birth: 06/07/1965   Transition of Care [facility]) CM/SW Contact:    Mearl Latin, LCSW Phone Number: 02/13/2021, 9:16 AM    Transition of Care Department [facility]) has reviewed patient and no TOC needs have been identified at this time. We will continue to monitor patient advancement through interdisciplinary progression rounds. If new patient transition needs arise, please place a TOC consult.

## 2021-02-13 NOTE — Progress Notes (Signed)
CT completed with faint blush in right thalamus, most consistent with contrast extravasation. Images personally reviewed.   Discussed findings with Dr. Nyra Jabs. Will load with ASA and Brilinta. After 2 hours, discontinue cangrelor infusion.   Discussed with RN.   Electronically signed: Dr. Caryl Pina

## 2021-02-13 NOTE — Progress Notes (Signed)
Given orders by Dr. Roda Shutters. To remove the arterial line. Per MD blood pressure parameters will be systolic 100-140 by the bp cuff.

## 2021-02-13 NOTE — Progress Notes (Addendum)
STROKE TEAM PROGRESS NOTE   ATTENDING NOTE: I reviewed above note and agree with the assessment and plan. Pt was seen and examined.   55 year old female with history of CHF, diabetes, OSA admitted for right gaze, left-sided weakness, slurred speech and aphasia, obtundation.  Intubated in ER.  CT no acute abnormality.  Status post TN K.  CTA head and neck showed distal BA, right P2 and P1 stenosis, right SCA occlusion, right VA occlusion, right A1 A3 stenosis.  Status post IR found to have bilateral P1 occlusion, with b/l TICI3 and left PCA stenting.  Post procedure CT showed likely contrast pending around right thalamus.  MRI brain showed embolic shower including right thalamic, right occipital lobe, bilateral cerebellum, bilateral parietal, left occipital and left frontal infarcts.  Petechial hemorrhage at right thalamus and right occipital lobe.  MRI showed patient left PCA stent and right PCA.  EF 20 to 25% with 2 LV thrombi.  LDL 151, A1c 11.9, UDS pending.  Creatinine 0.30  On exam, patient still intubated, however open eyes on voice, blinking to visual threat bilaterally, corneal and gag reflex present, follows some commands moving all extremities.  Etiology for patient stroke likely due to cardiomyopathy with low EF and LV thrombus.  Given no significant hemorrhagic transformation, will start heparin IV.  Given left PCA stenting we will continue Brilinta, but DC aspirin.  Continue Lipitor 40.  Extubated as able per CCM.  For detailed assessment and plan, please refer to above as I have made changes wherever appropriate.   Marvel Plan, MD PhD Stroke Neurology 02/13/2021 8:46 PM  This patient is critically ill due to embolic shower, bilateral PCA occlusion, cardiomyopathy with low EF, LV thrombus, status post TN K and at significant risk of neurological worsening, death form recurrent stroke, hemorrhagic transformation, bleeding from TN K, heart failure. This patient's care requires constant  monitoring of vital signs, hemodynamics, respiratory and cardiac monitoring, review of multiple databases, neurological assessment, discussion with family, other specialists and medical decision making of high complexity. I spent 40 minutes of neurocritical care time in the care of this patient.  I discussed with Dr. Isaiah Serge CCM and Dr. Sherlon Handing interventional radiology.    INTERVAL HISTORY Patient is seen in her room with no family at the bedside.  Yesterday, she was admitted with acute onset left-sided weakness, right gaze deviation and slurred speech.  She was found to have occlusion of the distal basilar artery, right P1 and proximal right P2.  She was given TNK and received mechanical thrombectomy to the right and left PCA with stenting on the left.  She remains intubated and has not yet been able to wean from the ventilator.  She was found to have two apical thrombi on her 2D echocardiogram today.  Vitals:   02/13/21 1100 02/13/21 1118 02/13/21 1200 02/13/21 1300  BP: 110/78  104/74 109/77  Pulse: 86 84 86 83  Resp: (!) 25 20 19  (!) 22  Temp:   (!) 97.4 F (36.3 C)   TempSrc:   Oral   SpO2: 99% 99% 99% 100%  Weight:      Height:       CBC:  Recent Labs  Lab 01/25/2021 1823 01/17/2021 1831 02/13/21 0259 02/13/21 0533  WBC 7.1  --   --  10.8*  NEUTROABS 4.8  --   --   --   HGB 15.9*   < > 14.3 13.8  HCT 49.7*   < > 42.0 43.1  MCV 89.9  --   --  88.7  PLT 260  --   --  229   < > = values in this interval not displayed.   Basic Metabolic Panel:  Recent Labs  Lab 02/02/2021 2341 02/13/21 0259 02/13/21 0533 02/13/21 1226  NA 134* 138 136  --   K 2.9* 4.2 3.6  --   CL 108  --  108  --   CO2 18*  --  21*  --   GLUCOSE 322*  --  96  --   BUN 11  --  9  --   CREATININE 0.48  --  0.39*  --   CALCIUM 7.2*  --  7.8*  --   MG  --   --  1.5* 2.3  PHOS  --   --  3.2 3.3   Lipid Panel:  Recent Labs  Lab 02/13/21 0533  CHOL 240*  TRIG 85   86  HDL 72  CHOLHDL 3.3  VLDL 17   LDLCALC 151*   HgbA1c:  Recent Labs  Lab 02/13/21 0533  HGBA1C 11.9*   Urine Drug Screen: No results for input(s): LABOPIA, COCAINSCRNUR, LABBENZ, AMPHETMU, THCU, LABBARB in the last 168 hours.  Alcohol Level  Recent Labs  Lab 01/29/2021 2341  ETH <10    IMAGING past 24 hours DG Abd 1 View  Result Date: 02/13/2021 CLINICAL DATA:  Enteric catheter placement EXAM: ABDOMEN - 1 VIEW COMPARISON:  None. FINDINGS: Frontal view of the lower chest and upper abdomen demonstrates enteric catheter passing below diaphragm, coiled over the gastric body. Tip and side port project over the gastric fundus. Bowel gas pattern is unremarkable. Excreted contrast within the kidneys, ureters, and bladder from previous contrasted evaluation. IMPRESSION: 1. Enteric catheter tip projecting over gastric fundus. Electronically Signed   By: Randa Ngo M.D.   On: 02/13/2021 01:34   CT HEAD WO CONTRAST (5MM)  Result Date: 02/13/2021 CLINICAL DATA:  Stroke follow-up EXAM: CT HEAD WITHOUT CONTRAST TECHNIQUE: Contiguous axial images were obtained from the base of the skull through the vertex without intravenous contrast. COMPARISON:  01/25/2021 at 6:30 p.m. FINDINGS: Brain: Focal hyperattenuation in the ventral right thalamus. There is an old infarct of the left frontal lobe. There is periventricular hypoattenuation compatible with chronic microvascular disease. Vascular: Atherosclerotic calcification of the internal carotid arteries at the skull base. No abnormal hyperdensity of the major intracranial arteries or dural venous sinuses. Skull: The visualized skull base, calvarium and extracranial soft tissues are normal. Sinuses/Orbits: No fluid levels or advanced mucosal thickening of the visualized paranasal sinuses. No mastoid or middle ear effusion. The orbits are normal. IMPRESSION: 1. Focal hyperattenuation in the ventral right thalamus, likely contrast material. 2. Old left frontal lobe infarct and findings of  chronic microvascular ischemia. Electronically Signed   By: Ulyses Jarred M.D.   On: 02/13/2021 00:33   ECHOCARDIOGRAM COMPLETE  Result Date: 02/13/2021    ECHOCARDIOGRAM REPORT   Patient Name:   Taylor [name] Date of Exam: 02/13/2021 Medical Rec #:  [id number]    Height:       61.0 in Accession #:    [id number]   Weight:       139.8 lb Date of Birth:  [birth year]    BSA:          1.622 m Patient Age:    56 years     BP:           115/74 mmHg Patient Gender: F  HR:           84 bpm. Exam Location:  Inpatient Procedure: 2D Echo, Color Doppler, Cardiac Doppler and Intracardiac            Opacification Agent Indications:    Stroke  History:        Patient has prior history of Echocardiogram examinations. CHF;                 Risk Factors:Diabetes and Sleep Apnea.  Sonographer:    Jyl Heinz Referring Phys: [id number] Loiza Geroge Gilliam IMPRESSIONS  1. Global hypokinesis with inferior akinesis. Inferoapical thrombus measuring 2.4 cm x 1.6 cm. Anteroapical thrombus measuring 1.1 x 1.1 cm. Left ventricular ejection fraction, by estimation, is 20 to 25%. Left ventricular ejection fraction by 3D volume  is 24 %. Left ventricular ejection fraction by 2D MOD biplane is 21.4 %. Left ventricular ejection fraction by PLAX is 18 %. The left ventricle has severely decreased function. The left ventricle demonstrates global hypokinesis. The left ventricular internal cavity size was severely dilated. Left ventricular diastolic parameters are consistent with Grade III diastolic dysfunction (restrictive). Elevated left ventricular end-diastolic pressure.  2. Right ventricular systolic function is moderately reduced. The right ventricular size is normal. There is moderately elevated pulmonary artery systolic pressure.  3. Left atrial size was severely dilated.  4. The mitral valve is normal in structure. Mild mitral valve regurgitation. No evidence of mitral stenosis.  5. Tricuspid valve regurgitation is moderate.  6. The aortic  valve is tricuspid. Aortic valve regurgitation is not visualized. No aortic stenosis is present.  7. The inferior vena cava is dilated in size with <50% respiratory variability, suggesting right atrial pressure of 15 mmHg. Comparison(s): Apical thrombi are new compared with echo 10/2020. FINDINGS  Left Ventricle: Global hypokinesis with inferior akinesis. Inferoapical thrombus measuring 2.4 cm x 1.6 cm. Anteroapical thrombus measuring 1.1 x 1.1 cm. Left ventricular ejection fraction, by estimation, is 20 to 25%. Left ventricular ejection fraction  by PLAX is 18 %. Left ventricular ejection fraction by 2D MOD biplane is 21.4 %. Left ventricular ejection fraction by 3D volume is 24 %. The left ventricle has severely decreased function. The left ventricle demonstrates global hypokinesis. The left ventricular internal cavity size was severely dilated. There is no left ventricular hypertrophy. Left ventricular diastolic parameters are consistent with Grade III diastolic dysfunction (restrictive). Elevated left ventricular end-diastolic pressure. Right Ventricle: The right ventricular size is normal. No increase in right ventricular wall thickness. Right ventricular systolic function is moderately reduced. There is moderately elevated pulmonary artery systolic pressure. The tricuspid regurgitant velocity is 2.75 m/s, and with an assumed right atrial pressure of 15 mmHg, the estimated right ventricular systolic pressure is 123456 mmHg. Left Atrium: Left atrial size was severely dilated. Right Atrium: Right atrial size was normal in size. Pericardium: Trivial pericardial effusion is present. Mitral Valve: The mitral valve is normal in structure. Mild mitral valve regurgitation. No evidence of mitral valve stenosis. Tricuspid Valve: The tricuspid valve is normal in structure. Tricuspid valve regurgitation is moderate . No evidence of tricuspid stenosis. Aortic Valve: The aortic valve is tricuspid. Aortic valve regurgitation is  not visualized. No aortic stenosis is present. Aortic valve peak gradient measures 3.1 mmHg. Pulmonic Valve: The pulmonic valve was normal in structure. Pulmonic valve regurgitation is mild to moderate. No evidence of pulmonic stenosis. Aorta: The aortic root is normal in size and structure. Venous: The inferior vena cava is dilated in size with less than 50%  respiratory variability, suggesting right atrial pressure of 15 mmHg. IAS/Shunts: No atrial level shunt detected by color flow Doppler.  LEFT VENTRICLE PLAX 2D                        Biplane EF (MOD) LV EF:         Left            LV Biplane EF:   Left                ventricular                      ventricular                ejection                         ejection                fraction by                      fraction by                PLAX is 18                       2D MOD                %.                               biplane is LVIDd:         6.00 cm                          21.4 %. LVIDs:         5.50 cm LV PW:         0.90 cm         Diastology LV IVS:        0.80 cm         LV e' medial:    2.84 cm/s LVOT diam:     2.00 cm         LV E/e' medial:  23.9 LV SV:         17              LV e' lateral:   2.84 cm/s LV SV Index:   11              LV E/e' lateral: 23.9 LVOT Area:     3.14 cm                                 3D Volume EF LV Volumes (MOD)               LV 3D EF:    Left LV vol d, MOD    222.0 ml                   ventricul A2C:                                        ar LV vol d, MOD  188.0 ml                   ejection A4C:                                        fraction LV vol s, MOD    184.0 ml                   by 3D A2C:                                        volume is LV vol s, MOD    143.0 ml                   24 %. A4C: LV SV MOD A2C:   38.0 ml LV SV MOD A4C:   188.0 ml      3D Volume EF: LV SV MOD BP:    46.1 ml       3D EF:        24 %                                LV EDV:       223 ml                                LV ESV:        170 ml                                LV SV:        53 ml RIGHT VENTRICLE            IVC RV Basal diam:  4.30 cm    IVC diam: 2.50 cm RV Mid diam:    3.10 cm RV S prime:     6.81 cm/s TAPSE (M-mode): 1.1 cm LEFT ATRIUM             Index        RIGHT ATRIUM           Index LA diam:        4.40 cm 2.71 cm/m   RA Area:     15.80 cm LA Vol (A2C):   78.6 ml 48.46 ml/m  RA Volume:   42.40 ml  26.14 ml/m LA Vol (A4C):   59.5 ml 36.68 ml/m LA Biplane Vol: 70.4 ml 43.40 ml/m  AORTIC VALVE AV Area (Vmax): 1.44 cm AV Vmax:        87.60 cm/s AV Peak Grad:   3.1 mmHg LVOT Vmax:      40.20 cm/s LVOT Vmean:     26.800 cm/s LVOT VTI:       0.055 m  AORTA Ao Root diam: 2.30 cm Ao Asc diam:  2.80 cm MITRAL VALVE               TRICUSPID VALVE MV Area (PHT): 5.93 cm    TR Peak grad:   30.2 mmHg MV Decel Time: 128 msec    TR Vmax:        275.00 cm/s MR Peak grad:  36.8 mmHg MR Vmax:      303.50 cm/s  SHUNTS MV E velocity: 67.90 cm/s  Systemic VTI:  0.06 m MV A velocity: 20.90 cm/s  Systemic Diam: 2.00 cm MV E/A ratio:  3.25 Skeet Latch MD Electronically signed by Skeet Latch MD Signature Date/Time: 02/13/2021/11:45:04 AM    Final    CT HEAD CODE STROKE WO CONTRAST  Result Date: 02/11/2021 CLINICAL DATA:  Code stroke. EXAM: CT HEAD WITHOUT CONTRAST TECHNIQUE: Contiguous axial images were obtained from the base of the skull through the vertex without intravenous contrast. COMPARISON:  10/21/2020 FINDINGS: Brain: No evidence of acute infarction, hemorrhage, cerebral edema, mass, mass effect, or midline shift. Ventricles and sulci are normal for age. No extra-axial fluid collection. Redemonstrated chronic hypodensity in the left frontal lobe and right caudate head. Vascular: No hyperdense vessel or unexpected calcification. Skull: Normal. Negative for fracture or focal lesion. Sinuses/Orbits: No acute finding. Other: The mastoid air cells are well aerated. ASPECTS [facility] Stroke Program Early CT Score) - Ganglionic  level infarction (caudate, lentiform nuclei, internal capsule, insula, M1-M3 cortex): 7 - Supraganglionic infarction (M4-M6 cortex): 3 Total score (0-10 with 10 being normal): 10 IMPRESSION: 1. No acute intracranial process. 2. ASPECTS is 10 Code stroke imaging results were communicated on 01/30/2021 at 6:41 pm to provider [facility] via secure text paging. Electronically Signed   By: Merilyn Baba M.D.   On: 02/11/2021 18:41   CT ANGIO HEAD NECK W WO CM (CODE STROKE)  Result Date: 01/27/2021 CLINICAL DATA:  Stroke code, neuro deficit, acute EXAM: CT ANGIOGRAPHY HEAD AND NECK TECHNIQUE: Multidetector CT imaging of the head and neck was performed using the standard protocol during bolus administration of intravenous contrast. Multiplanar CT image reconstructions and MIPs were obtained to evaluate the vascular anatomy. Carotid stenosis measurements (when applicable) are obtained utilizing NASCET criteria, using the distal internal carotid diameter as the denominator. CONTRAST:  80mL OMNIPAQUE IOHEXOL 350 MG/ML SOLN COMPARISON:  No prior CTA, correlation is made with CT head 01/22/2021 and MR head 10/21/2020 FINDINGS: CT HEAD FINDINGS For noncontrast findings, please see same day CT head. CTA NECK FINDINGS Aortic arch: Standard branching. Imaged portion shows no evidence of aneurysm or dissection. No significant stenosis of the major arch vessel origins. Aortic atherosclerosis. Right carotid system: No evidence of dissection, stenosis (50% or greater) or occlusion. Left carotid system: No evidence of dissection, stenosis (50% or greater) or occlusion. Vertebral arteries: Codominant. No evidence of dissection, stenosis (50% or greater) or occlusion. Skeleton: No acute osseous abnormality. Other neck: Negative. Upper chest: Right pleural effusion. No left pleural effusion. Hazy opacities, possibly pulmonary edema. Review of the MIP images confirms the above findings CTA HEAD FINDINGS Anterior circulation: Both internal  carotid arteries are patent to the termini, mild calcifications in the cavernous segment and more significant calcifications in the right-greater-than-left supraclinoid segments, which cause moderate narrowing. Focal stenosis in the right A1 origin (series 7, image 104). Diminutive but patent left A1. Normal anterior communicating artery. Focal poor opacification of the right A3 (series 7, image 90) anterior cerebral arteries are otherwise patent to their distal aspects. No significant M1 stenosis or occlusion. Normal MCA bifurcations. Distal MCA branches perfused and symmetric. Posterior circulation: Poor opacification of the right vertebral artery, after the right PICA takeoff, with only intermittent flow proximally and likely retrograde flow distally (series 7, image 127). The left vertebral artery is somewhat irregular but patent. Posterior inferior cerebral arteries patent bilaterally. The basilar artery is mildly irregular proximally and  poorly opacified in the mid basilar (series 7, image 112), not definitively seen at its distal aspect. The left posterior communicating artery is patent and likely supplies the left P1 segment, which also likely retrograde supplies the left superior cerebellar artery. The left PCA is patent to its distal aspect. The right superior cerebellar artery and right P1 segment are non-opacified. There is distal reconstitution of the right PCA (series 7, image 100), likely secondary to collaterals. Venous sinuses: As permitted by contrast timing, patent. Anatomic variants: None significant Review of the MIP images confirms the above findings IMPRESSION: 1. Non opacification of the distal basilar artery, right P1 and proximal P2, and right SCA, of indeterminate acuity. The distal right P2 reconstitutes, possibly secondary to collaterals. The left PCA is supplied by the left posterior communicating artery, with retrograde flow likely supplying the left superior cerebellar artery. 2. Poor  opacification of the right vertebral artery, with only intermittent flow from the right PICA takeoff to the vertebrobasilar junction; distal right vertebral artery flow is favored to be retrograde. 3. Focal stenosis in the right A1 and A3. 4. Moderate narrowing of the right-greater-than-left supraclinoid ICA. 5. No hemodynamically significant stenosis in the neck. Code stroke imaging results were communicated on 01/26/2021 at 7:14 pm to provider [facility] via secure text paging. Electronically Signed   By: Merilyn Baba M.D.   On: 01/29/2021 19:16    PHYSICAL EXAM General:  Intubated, well-developed, well-nourished female in no acute distress  Neurological:  PERRL, EOMI.  Cough and gag reflexes present.  Able to move all extremities in response to commands.  ASSESSMENT/PLAN Taylor [name] is a 55 y.o. female with history of CHF, T2DM and OSA presenting with acute onset left-sided weakness, right gaze deviation and slurred speech. She was found to have occlusion of the distal basilar artery, right P1 and proximal right P2.  She was given TNK and received mechanical thrombectomy to the right and left PCA with stenting on the left.  She remains intubated and has not yet been able to wean from the ventilator.  She was found to have two apical thrombi on her 2D echocardiogram today.  Stroke:  embolic shower with b/l PCA occlusion s/p IR with TICI3 and left PCA stenting, likely secondary due to embolism from low EF and apical thrombi Code Stroke CT head No acute abnormality.  ASPECTS 10.    CTA head & neck nonopacification of distal basilar artery, right P1 and P2, poor opacification of right vertebral artery, focal stenosis in right A1 and A3 Post IR CT focal hyperattenuation in right thalamus, likely contrast material MRI  pending 2D Echo global hypokinesis with inferior akinesis, EF 0000000, grade 3 diastolic dysfunction, thrombi in inferoapical and anteroapical areas, no atrial level shunt LDL  151 HgbA1c 11.9 VTE prophylaxis - SCDs    Diet   Diet NPO time specified   aspirin 81 mg daily prior to admission, now on aspirin 81 mg daily and Brilinta (ticagrelor) 90 mg bid. 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% Two apical thrombi present Consider systemic anticoagulation with heparin for apical thrombi  Hypertension Home meds:  lisinopril 2.5 mg daily Stable SBP 120-140 Long-term BP goal normotensive  Hyperlipidemia Home meds:  atorvastatin 40 mg daily, resumed in hospital LDL 151, goal < 70 Increase to 80 mg atorvastatin daily  Continue statin at discharge  Diabetes type  II Uncontrolled Home meds:  Jardiance 25 mg daily, levemir 17 units BID, metformin 1000 mg BID HgbA1c 11.9, goal < 7.0 CBGs Diabetes coordinator consult SSI  Other Stroke Risk Factors Former cigarette smoker Obstructive sleep apnea Congestive heart failure  Other Active Problems Respiratory failure Currently intubated and ventilated Management per CCM Aim to extubate tomorrow  Hospital day # [place] , MSN, AGACNP-BC [facility] See Amion for schedule and pager information 02/13/2021 2:55 PM    To contact Stroke Continuity provider, please refer to [email or web address] After hours, contact General Neurology

## 2021-02-13 NOTE — Progress Notes (Signed)
BP goal 110-140 per [place]. Will continue to monitor.

## 2021-02-13 NOTE — TOC Benefit Eligibility Note (Signed)
Patient Product/process development scientist completed.    The patient is currently admitted and upon discharge could be taking Eliquis 5 mg.  The current 30 day co-pay is, $0.00.   The patient is currently admitted and upon discharge could be taking Xarelto 20 mg.  The current 30 day co-pay is, $0.00.   The patient is insured through [employer]     Roland Earl, CPhT Pharmacy Patient Advocate Specialist [facility] Patient Advocate Team Direct Number: [phone]  Fax: [phone]

## 2021-02-14 DIAGNOSIS — I639 Cerebral infarction, unspecified: Secondary | ICD-10-CM | POA: Diagnosis not present

## 2021-02-14 DIAGNOSIS — Z978 Presence of other specified devices: Secondary | ICD-10-CM | POA: Diagnosis not present

## 2021-02-14 DIAGNOSIS — I5020 Unspecified systolic (congestive) heart failure: Secondary | ICD-10-CM | POA: Diagnosis not present

## 2021-02-14 DIAGNOSIS — G4733 Obstructive sleep apnea (adult) (pediatric): Secondary | ICD-10-CM | POA: Diagnosis not present

## 2021-02-14 HISTORY — PX: IR CT HEAD LTD: IMG2386

## 2021-02-14 LAB — GLUCOSE, CAPILLARY
Glucose-Capillary: 108 mg/dL — ABNORMAL HIGH (ref 70–99)
Glucose-Capillary: 112 mg/dL — ABNORMAL HIGH (ref 70–99)
Glucose-Capillary: 133 mg/dL — ABNORMAL HIGH (ref 70–99)
Glucose-Capillary: 176 mg/dL — ABNORMAL HIGH (ref 70–99)
Glucose-Capillary: 225 mg/dL — ABNORMAL HIGH (ref 70–99)
Glucose-Capillary: 107 mg/dL — ABNORMAL HIGH (ref 70–99)

## 2021-02-14 LAB — CBC
HCT: 45.5 % (ref 36.0–46.0)
Hemoglobin: 14.4 g/dL (ref 12.0–15.0)
MCH: 28.1 pg (ref 26.0–34.0)
MCHC: 31.6 g/dL (ref 30.0–36.0)
MCV: 88.7 fL (ref 80.0–100.0)
Platelets: 252 10*3/uL (ref 150–400)
RBC: 5.13 MIL/uL — ABNORMAL HIGH (ref 3.87–5.11)
RDW: 16.1 % — ABNORMAL HIGH (ref 11.5–15.5)
WBC: 11.9 10*3/uL — ABNORMAL HIGH (ref 4.0–10.5)
nRBC: 0 % (ref 0.0–0.2)

## 2021-02-14 LAB — BASIC METABOLIC PANEL
Anion gap: 9 (ref 5–15)
BUN: 8 mg/dL (ref 6–20)
CO2: 20 mmol/L — ABNORMAL LOW (ref 22–32)
Calcium: 8 mg/dL — ABNORMAL LOW (ref 8.9–10.3)
Chloride: 105 mmol/L (ref 98–111)
Creatinine, Ser: 0.53 mg/dL (ref 0.44–1.00)
GFR, Estimated: >60 mL/min (ref >60)
Glucose, Bld: 97 mg/dL (ref 70–99)
Potassium: 4.1 mmol/L (ref 3.5–5.1)
Sodium: 134 mmol/L — ABNORMAL LOW (ref 135–145)

## 2021-02-14 LAB — MAGNESIUM
Magnesium: 2 mg/dL (ref 1.7–2.4)
Magnesium: 2.1 mg/dL (ref 1.7–2.4)

## 2021-02-14 LAB — HEPARIN LEVEL (UNFRACTIONATED)
Heparin Unfractionated: <0.1 IU/mL — ABNORMAL LOW (ref 0.30–0.70)
Heparin Unfractionated: <0.1 IU/mL — ABNORMAL LOW (ref 0.30–0.70)
Heparin Unfractionated: 0.28 IU/mL — ABNORMAL LOW (ref 0.30–0.70)

## 2021-02-14 LAB — PHOSPHORUS
Phosphorus: 3.9 mg/dL (ref 2.5–4.6)
Phosphorus: 4.1 mg/dL (ref 2.5–4.6)

## 2021-02-14 MED ORDER — MIDODRINE HCL 5 MG PO TABS
10.0000 mg | ORAL_TABLET | Freq: Three times a day (TID) | ORAL | Status: DC
  Administered 2021-02-14 – 2021-03-09 (×70): 10 mg
  Filled 2021-02-14 (×69): qty 2

## 2021-02-14 MED ORDER — MIDODRINE HCL 5 MG PO TABS
5.0000 mg | ORAL_TABLET | Freq: Three times a day (TID) | ORAL | Status: DC
  Administered 2021-02-14: 09:00:00 5 mg
  Filled 2021-02-14: qty 1

## 2021-02-14 MED ORDER — MIDODRINE HCL 5 MG PO TABS: 10.0000 mg | ORAL_TABLET | Freq: Three times a day (TID) | ORAL | Status: DC

## 2021-02-14 MED ORDER — MIDODRINE HCL 5 MG PO TABS
5.0000 mg | ORAL_TABLET | Freq: Once | ORAL | Status: AC
  Administered 2021-02-14: 12:00:00 5 mg
  Filled 2021-02-14: qty 1

## 2021-02-14 NOTE — Evaluation (Signed)
Physical Therapy Evaluation Patient Details Name: Taylor [name] MRN: [id number] DOB: May 20, 1965 Today's Date: 02/14/2021  History of Present Illness  55 y/o female presented to ED on 12/27 for L sided paralysis, R gaze, slurred speech, and L facial droop. TNK given. S/p mechanical thrombectomy of bilateral occluded P1/PCA. MRI showed acute R PCA infarcts including infarcts in the R thalamus and R occipital lobe with mild mass effect with partial effacement of 3rd ventricle, numerous small acute infarcts in bilateral cerebellar hemispheres and few scattered small acute infarcts in bilateral parietal lobes, L occipital and L frontal lobes. Intubated 12/27. PMH: CHF, sleep apnea, type 2 DM  Clinical Impression  Patient admitted with above diagnosis. Patient presents with generalized weakness (L>R), impaired balance, decreased activity tolerance, and impaired cognition. Patient currently intubated and weaning on CPAP settings. Placed in chair position to assess patient tolerance. Patient following 1 step commands inconsistently (~50% of session). Patient will benefit from skilled PT services during acute stay to address listed deficits. Recommend CIR at discharge to maximize functional independence and safety.      Recommendations for follow up therapy are one component of a multi-disciplinary discharge planning process, led by the attending physician.  Recommendations may be updated based on patient status, additional functional criteria and insurance authorization.  Follow Up Recommendations Acute inpatient rehab (3hours/day)    Assistance Recommended at Discharge Frequent or constant Supervision/Assistance  Functional Status Assessment Patient has had a recent decline in their functional status and demonstrates the ability to make significant improvements in function in a reasonable and predictable amount of time.  Equipment Recommendations  Other (comment) (TBD)    Recommendations for Other  Services Rehab consult     Precautions / Restrictions Precautions Precautions: Fall Precaution Comments: vent Restrictions Weight Bearing Restrictions: No      Mobility  Bed Mobility               General bed mobility comments: placed in chair position in bed with good tolerance and VSS    Transfers                        Ambulation/Gait                  Stairs            Wheelchair Mobility    Modified Rankin (Stroke Patients Only) Modified Rankin (Stroke Patients Only) Pre-Morbid Rankin Score: No significant disability Modified Rankin: Severe disability     Balance                                             Pertinent Vitals/Pain Pain Assessment: CPOT Facial Expression: Relaxed, neutral Body Movements: Absence of movements Muscle Tension: Relaxed Compliance with ventilator (intubated pts.): Tolerating ventilator or movement Vocalization (extubated pts.): N/A CPOT Total: 0 Pain Intervention(s): Monitored during session    Home Living Family/patient expects to be discharged to:: Private residence Living Arrangements: Spouse/significant other Available Help at Discharge: Family Type of Home: Apartment Home Access: Level entry       Home Layout: One level Home Equipment: None Additional Comments: some information obtained from previous admission. Difficult to obtain all information due to patient being intubated    Prior Function Prior Level of Function : Independent/Modified Independent  Hand Dominance        Extremity/Trunk Assessment   Upper Extremity Assessment Upper Extremity Assessment: Defer to OT evaluation    Lower Extremity Assessment Lower Extremity Assessment: Generalized weakness;Difficult to assess due to impaired cognition (L LE weaker than R LE. Not following all commands)       Communication   Communication: Other (comment) (intubated)  Cognition  Arousal/Alertness: Awake/alert Behavior During Therapy: Flat affect Overall Cognitive Status: Difficult to assess                                 General Comments: following 1 step commands inconsistently. Answers yes/no questions intermittently        General Comments General comments (skin integrity, edema, etc.): VSS on CPAP vent settings    Exercises     Assessment/Plan    PT Assessment Patient needs continued PT services  PT Problem List Decreased strength;Decreased balance;Decreased activity tolerance;Decreased mobility;Decreased coordination;Decreased cognition;Decreased knowledge of use of DME;Decreased knowledge of precautions;Decreased safety awareness;Cardiopulmonary status limiting activity       PT Treatment Interventions DME instruction;Gait training;Functional mobility training;Therapeutic activities;Therapeutic exercise;Balance training;Neuromuscular re-education;Patient/family education    PT Goals (Current goals can be found in the Care Plan section)  Acute Rehab PT Goals Patient Stated Goal: unable to state PT Goal Formulation: Patient unable to participate in goal setting Time For Goal Achievement: 02/28/21 Potential to Achieve Goals: Fair    Frequency Min 3X/week   Barriers to discharge        Co-evaluation PT/OT/SLP Co-Evaluation/Treatment: Yes Reason for Co-Treatment: For patient/therapist safety PT goals addressed during session: Strengthening/ROM         AM-PAC PT "6 Clicks" Mobility  Outcome Measure Help needed turning from your back to your side while in a flat bed without using bedrails?: Total Help needed moving from lying on your back to sitting on the side of a flat bed without using bedrails?: Total Help needed moving to and from a bed to a chair (including a wheelchair)?: Total Help needed standing up from a chair using your arms (e.g., wheelchair or bedside chair)?: Total Help needed to walk in hospital room?:  Total Help needed climbing 3-5 steps with a railing? : Total 6 Click Score: 6    End of Session Equipment Utilized During Treatment: Oxygen Activity Tolerance: Patient tolerated treatment well Patient left: in bed;with call bell/phone within reach;with restraints reapplied Nurse Communication: Mobility status PT Visit Diagnosis: Unsteadiness on feet (R26.81);Muscle weakness (generalized) (M62.81);Difficulty in walking, not elsewhere classified (R26.2);Other symptoms and signs involving the nervous system (J81.191)    Time: 4782-9562 PT Time Calculation (min) (ACUTE ONLY): 21 min   Charges:   PT Evaluation $PT Eval Moderate Complexity: 1 Mod          Antario Yasuda A. Dan Humphreys PT, DPT Acute Rehabilitation Services Pager [phone] Office [phone]   Viviann Spare 02/14/2021, 4:06 PM

## 2021-02-14 NOTE — Progress Notes (Signed)
° °  Inpatient Rehab Admissions Coordinator :  Per therapy recommendations patient was screened for CIR candidacy by Ottie Glazier RN MSN. Patient is not yet at a level to tolerate the intensity required to pursue a CIR admit due to bed level assessment only. Patient may have the potential to progress to become a candidate. The CIR admissions team will follow and monitor for progress and place a Rehab Consult order if felt to be appropriate. Please contact me with any questions.  Ottie Glazier RN MSN Admissions Coordinator [phone]

## 2021-02-14 NOTE — Progress Notes (Signed)
ANTICOAGULATION CONSULT NOTE   Pharmacy Consult for IV Heparin Indication:  LV thrombus, CVA  No Known Allergies  Patient Measurements: Height: _0  (154.9 cm) Weight: 63.4 kg (139 lb 12.4 oz) IBW/kg (Calculated) : 47.8 Heparin Dosing Weight: 60.8 kg  Vital Signs: Temp: 97.6 F (36.4 C) (12/28 2346) Temp Source: Axillary (12/28 2346) BP: 117/83 (12/28 2336) Pulse Rate: 110 (12/28 2336)  Labs: Recent Labs    02/15/2021 1823 02/01/2021 1831 02/11/2021 2341 02/13/21 0259 02/13/21 0533 02/14/21 0105  HGB 15.9*   < >  --  14.3 13.8 14.4  HCT 49.7*   < >  --  42.0 43.1 45.5  PLT 260  --   --   --  229 252  APTT 25  --   --   --   --   --   LABPROT 12.3  --   --   --   --   --   INR 0.9  --   --   --   --   --   HEPARINUNFRC  --   --   --   --   --  <0.10*  CREATININE  --    < > 0.48  --  0.39* 0.53   < > = values in this interval not displayed.     Estimated Creatinine Clearance: 66.9 mL/min (by C-G formula based on SCr of 0.53 mg/dL).   Medical History: Past Medical History:  Diagnosis Date   CHF (congestive heart failure) ([place])    Diabetes mellitus without complication ([place])    Obstructive sleep apnea     Medications:  Scheduled:    stroke: mapping our early stages of recovery book   Does not apply Once   atorvastatin  80 mg Per Tube Daily   chlorhexidine gluconate (MEDLINE KIT)  15 mL Mouth Rinse BID   Chlorhexidine Gluconate Cloth  6 each Topical Q0600   docusate  100 mg Per Tube BID   feeding supplement (PROSource TF)  45 mL Per Tube Daily   insulin aspart  0-15 Units Subcutaneous Q4H   mouth rinse  15 mL Mouth Rinse 10 times per day   multivitamin with minerals  1 tablet Per Tube Daily   pantoprazole sodium  40 mg Per Tube QHS   polyethylene glycol  17 g Per Tube Daily   sodium chloride flush  3 mL Intravenous Once   ticagrelor  90 mg Per Tube BID   Infusions:   sodium chloride 50 mL/hr at 02/13/21 2000   sodium chloride     feeding supplement  (GLUCERNA 1.5 CAL) 1,000 mL (02/13/21 1445)   heparin 750 Units/hr (02/13/21 2000)   norepinephrine (LEVOPHED) Adult infusion 3 mcg/min (02/13/21 2000)   propofol (DIPRIVAN) infusion 10 mcg/kg/min (02/13/21 2000)    Assessment: 55 years of age female with CVA and LV thrombus. Pharmacy consulted to start IV heparin per stroke protocol with low goal of 0.3 to 0.5, no bolus.   Patient received TNKase for stroke on 12/27 at 1900 PM.  CBC within normal limits. No bleeding noted.  Patient currently on Brilinta for anti-platelet therapy.   12/29 AM update:  Heparin level undetectable   Goal of Therapy:  Heparin level 0.3 to 0.5 units/ml Monitor platelets by anticoagulation protocol: Yes   Plan:  Inc heparin to 900 units/hr 1000 heparin level  Narda Bonds, PharmD, BCPS Clinical Pharmacist Phone: [phone]

## 2021-02-14 NOTE — Progress Notes (Signed)
ANTICOAGULATION CONSULT NOTE  Pharmacy Consult for IV Heparin Indication:  LV thrombus, CVA  No Known Allergies  Patient Measurements: Height: 5\' 1"  (154.9 cm) Weight: 63.4 kg (139 lb 12.4 oz) IBW/kg (Calculated) : 47.8 Heparin Dosing Weight: 60.8 kg  Vital Signs: Temp: 99.3 F (37.4 C) (12/29 0800) Temp Source: Axillary (12/29 0800) BP: 99/67 (12/29 1000) Pulse Rate: 102 (12/29 1000)  Labs: Recent Labs    2021-02-23 1823 Feb 23, 2021 1831 2021/02/23 2341 02/13/21 0259 02/13/21 0533 02/14/21 0105 02/14/21 1000  HGB 15.9*   < >  --  14.3 13.8 14.4  --   HCT 49.7*   < >  --  42.0 43.1 45.5  --   PLT 260  --   --   --  229 252  --   APTT 25  --   --   --   --   --   --   LABPROT 12.3  --   --   --   --   --   --   INR 0.9  --   --   --   --   --   --   HEPARINUNFRC  --   --   --   --   --  <0.10* <0.10*  CREATININE  --    < > 0.48  --  0.39* 0.53  --    < > = values in this interval not displayed.     Estimated Creatinine Clearance: 66.9 mL/min (by C-G formula based on SCr of 0.53 mg/dL).  Assessment: 55 years of age female with CVA and LV thrombus.  Patient is s/p TNK on 12/27.  Pharmacy consulted to dose IV heparin per stroke protocol.  Heparin level remains undetectable; no issue with infusion nor bleeding per discussion with RN.  Goal of Therapy:  Heparin level 0.3 to 0.5 units/ml Monitor platelets by anticoagulation protocol: Yes   Plan:  Increase heparin infusion to 1100 units/hr - no bolus with acute CVA Check 6 hr heparin level Daily heparin level and CBC  Domanik Rainville D. 1/28, PharmD, BCPS, BCCCP 02/14/2021, 11:20 AM

## 2021-02-14 NOTE — Progress Notes (Signed)
ANTICOAGULATION CONSULT NOTE - Follow Up Consult  Pharmacy Consult for Heparin Indication:  CVA with LV thrombus  No Known Allergies  Patient Measurements: Height: 5\' 1"  (154.9 cm) Weight: 63.4 kg (139 lb 12.4 oz) IBW/kg (Calculated) : 47.8 Heparin Dosing Weight: 60.8 kg  Vital Signs: Temp: 98.7 F (37.1 C) (12/29 1600) Temp Source: Axillary (12/29 1600) BP: 113/78 (12/29 1600) Pulse Rate: 101 (12/29 1600)  Labs: Recent Labs    01/23/2021 1823 02/04/2021 1831 01/17/2021 2341 02/13/21 0259 02/13/21 0533 02/14/21 0105 02/14/21 1000 02/14/21 1815  HGB 15.9*   < >  --  14.3 13.8 14.4  --   --   HCT 49.7*   < >  --  42.0 43.1 45.5  --   --   PLT 260  --   --   --  229 252  --   --   APTT 25  --   --   --   --   --   --   --   LABPROT 12.3  --   --   --   --   --   --   --   INR 0.9  --   --   --   --   --   --   --   HEPARINUNFRC  --   --   --   --   --  <0.10* <0.10* 0.28*  CREATININE  --    < > 0.48  --  0.39* 0.53  --   --    < > = values in this interval not displayed.    Estimated Creatinine Clearance: 66.9 mL/min (by C-G formula based on SCr of 0.53 mg/dL).  Assessment: Anticoag: Heparin for new LV thrombus in setting of acute CVA, HL < 0.1>>0.28 almost in goal.  Goal of Therapy:  Heparin level 0.3-0.5 units/ml Monitor platelets by anticoagulation protocol: Yes   Plan:  Increase heparin infusion to 1200 units/hr Daily heparin level and CBC  . Teal Bontrager S. 02/16/21, PharmD, BCPS Clinical Staff Pharmacist [email or web address] Merilynn Finland, Merilynn Finland 02/14/2021,7:53 PM

## 2021-02-14 NOTE — Progress Notes (Addendum)
STROKE TEAM PROGRESS NOTE   INTERVAL HISTORY Patient is seen in her room with no family at the bedside.  She has been hypotensive today requiring levophed.  Starting midodrine in an attempt to wean levophed.  She is not yet ready for extubation.  Her neurological exam is unchanged.  Vitals:   02/14/21 1400 02/14/21 1415 02/14/21 1430 02/14/21 1445  BP: 111/73 (!) 115/103 (!) 111/56 109/73  Pulse: (!) 108 (!) 105 (!) 111 (!) 107  Resp: (!) 24 (!) 30 (!) 25 (!) 27  Temp:      TempSrc:      SpO2: 100% 100% 97% 100%  Weight:      Height:       CBC:  Recent Labs  Lab 01/24/2021 1823 01/30/2021 1831 02/13/21 0533 02/14/21 0105  WBC 7.1  --  10.8* 11.9*  NEUTROABS 4.8  --   --   --   HGB 15.9*   < > 13.8 14.4  HCT 49.7*   < > 43.1 45.5  MCV 89.9  --  88.7 88.7  PLT 260  --  229 252   < > = values in this interval not displayed.    Basic Metabolic Panel:  Recent Labs  Lab 02/13/21 0533 02/13/21 1226 02/13/21 1802 02/14/21 0105  NA 136  --   --  134*  K 3.6  --   --  4.1  CL 108  --   --  105  CO2 21*  --   --  20*  GLUCOSE 96  --   --  97  BUN 9  --   --  8  CREATININE 0.39*  --   --  0.53  CALCIUM 7.8*  --   --  8.0*  MG 1.5*   < > 2.0 2.0  PHOS 3.2   < > 3.7 3.9   < > = values in this interval not displayed.    Lipid Panel:  Recent Labs  Lab 02/13/21 0533  CHOL 240*  TRIG 85   86  HDL 72  CHOLHDL 3.3  VLDL 17  LDLCALC 151*    HgbA1c:  Recent Labs  Lab 02/13/21 0533  HGBA1C 11.9*    Urine Drug Screen: No results for input(s): LABOPIA, COCAINSCRNUR, LABBENZ, AMPHETMU, THCU, LABBARB in the last 168 hours.  Alcohol Level  Recent Labs  Lab 02/14/2021 2341  ETH <10     IMAGING past 24 hours MR ANGIO HEAD WO CONTRAST  Addendum Date: 02/13/2021   ADDENDUM REPORT: 02/13/2021 18:17 ADDENDUM: Addition to the MRI HEAD findings section under "brain": Susceptibility artifact within the right thalamus and right occipital lobe. No evidence of hydrocephalus,  extra-axial fluid collection, or mass lesion. Electronically Signed   By: Margaretha Sheffield M.D.   On: 02/13/2021 18:17   Result Date: 02/13/2021 CLINICAL DATA:  Stroke, follow up EXAM: MRI HEAD WITHOUT CONTRAST MRA HEAD WITHOUT CONTRAST TECHNIQUE: Multiplanar, multi-echo pulse sequences of the brain and surrounding structures were acquired without intravenous contrast. Angiographic images of the Circle of Willis were acquired using MRA technique without intravenous contrast. COMPARISON:  No pertinent prior exam. FINDINGS: MRI HEAD FINDINGS Brain: Acute right PCA territory infarcts, including infarcts in the right thalamus and right occipital lobe. Numerous small additional infarcts within bilateral cerebellar hemispheres. small acute infarcts in bilateral parietal lobes, left occipital and left frontal lobes. Remote lacunar infarcts in the right basal ganglia, left pons and cerebellum. Mild mass effect with partial effacement of the third ventricle. Vascular:  See below. Skull and upper cervical spine: Normal marrow signal. Sinuses/Orbits: Paranasal sinus mucosal thickening. Unremarkable orbits. Other: No mastoid effusions. MRA HEAD FINDINGS Anterior circulation: Bilateral intracranial ICAs, MCAs, and ACAs are patent. Right A1 and A3 ACA stenosis, similar to recent CTA. Posterior circulation: Poor opacification of the right vertebral arter artery, as characterized on recent CTA. The left vertebral artery is patent. Mildly irregular and narrowed mid basilar artery. Preserved flow related signal in the basilar artery up until the distal tip where there is poor flow related signal. It is unclear poor flow related signal in the distal tip of the basilar artery is related to poor flow or artifact from the adjacent stent. There is a stent within the left P1 and proximal P2 PCA. Limited evaluation of In-Stent stenosis due to artifact; however, the stent appears to be patent given preservation of flow related signal  within the more distal left PCA. Left posterior communicating artery. There is also good flow related signal within the patent right PCA. IMPRESSION: MRI: 1. Acute right PCA territory infarcts, including infarcts in the right thalamus and right occipital lobe. Mild mass effect with partial effacement of the third ventricle. 2. Numerous small acute infarcts in bilateral cerebellar hemispheres and a few scattered small acute infarcts in bilateral parietal lobes, left occipital and left frontal lobes. 3. Areas of susceptibility artifact in the right occipital lobe and right thalamus is suggestive of petechial hemorrhage, although an element of superimposed contrast extravasation is possible. Follow-up CT head exam is recommended to exclude progressive hemorrhage. 4. Similar remote infarcts, as detailed above. MRA: 1. Interval thrombectomy and placement of a left P1/P2 PCA stent. Good flow related signal in the more distal left PCA, compatible with patent stent. The right P1 PCA is also now patent. 2. Poor flow related signal within the distal basilar tip, which could be artifactual from adjacent stent or related to poor flow. 3. Similar poor opacification of right vertebral artery. 4. Similar right A1 and A3 ACA stenosis. These results will be called to the ordering clinician or representative by the Radiologist Assistant, and communication documented in the PACS or [employer]. Electronically Signed: By: Margaretha Sheffield M.D. On: 02/13/2021 18:12   MR BRAIN WO CONTRAST  Addendum Date: 02/13/2021   ADDENDUM REPORT: 02/13/2021 18:17 ADDENDUM: Addition to the MRI HEAD findings section under "brain": Susceptibility artifact within the right thalamus and right occipital lobe. No evidence of hydrocephalus, extra-axial fluid collection, or mass lesion. Electronically Signed   By: Margaretha Sheffield M.D.   On: 02/13/2021 18:17   Result Date: 02/13/2021 CLINICAL DATA:  Stroke, follow up EXAM: MRI HEAD WITHOUT  CONTRAST MRA HEAD WITHOUT CONTRAST TECHNIQUE: Multiplanar, multi-echo pulse sequences of the brain and surrounding structures were acquired without intravenous contrast. Angiographic images of the Circle of Willis were acquired using MRA technique without intravenous contrast. COMPARISON:  No pertinent prior exam. FINDINGS: MRI HEAD FINDINGS Brain: Acute right PCA territory infarcts, including infarcts in the right thalamus and right occipital lobe. Numerous small additional infarcts within bilateral cerebellar hemispheres. small acute infarcts in bilateral parietal lobes, left occipital and left frontal lobes. Remote lacunar infarcts in the right basal ganglia, left pons and cerebellum. Mild mass effect with partial effacement of the third ventricle. Vascular: See below. Skull and upper cervical spine: Normal marrow signal. Sinuses/Orbits: Paranasal sinus mucosal thickening. Unremarkable orbits. Other: No mastoid effusions. MRA HEAD FINDINGS Anterior circulation: Bilateral intracranial ICAs, MCAs, and ACAs are patent. Right A1 and A3 ACA stenosis,  similar to recent CTA. Posterior circulation: Poor opacification of the right vertebral arter artery, as characterized on recent CTA. The left vertebral artery is patent. Mildly irregular and narrowed mid basilar artery. Preserved flow related signal in the basilar artery up until the distal tip where there is poor flow related signal. It is unclear poor flow related signal in the distal tip of the basilar artery is related to poor flow or artifact from the adjacent stent. There is a stent within the left P1 and proximal P2 PCA. Limited evaluation of In-Stent stenosis due to artifact; however, the stent appears to be patent given preservation of flow related signal within the more distal left PCA. Left posterior communicating artery. There is also good flow related signal within the patent right PCA. IMPRESSION: MRI: 1. Acute right PCA territory infarcts, including  infarcts in the right thalamus and right occipital lobe. Mild mass effect with partial effacement of the third ventricle. 2. Numerous small acute infarcts in bilateral cerebellar hemispheres and a few scattered small acute infarcts in bilateral parietal lobes, left occipital and left frontal lobes. 3. Areas of susceptibility artifact in the right occipital lobe and right thalamus is suggestive of petechial hemorrhage, although an element of superimposed contrast extravasation is possible. Follow-up CT head exam is recommended to exclude progressive hemorrhage. 4. Similar remote infarcts, as detailed above. MRA: 1. Interval thrombectomy and placement of a left P1/P2 PCA stent. Good flow related signal in the more distal left PCA, compatible with patent stent. The right P1 PCA is also now patent. 2. Poor flow related signal within the distal basilar tip, which could be artifactual from adjacent stent or related to poor flow. 3. Similar poor opacification of right vertebral artery. 4. Similar right A1 and A3 ACA stenosis. These results will be called to the ordering clinician or representative by the Radiologist Assistant, and communication documented in the PACS or [employer]. Electronically Signed: By: Margaretha Sheffield M.D. On: 02/13/2021 18:12   IR CT Head Ltd  Result Date: 02/14/2021 INDICATION: Taylor [name] is a 55 year old female with a past medical history significant for CHF, type 2 diabetes mellitus, and obstructive sleep apnea who presented to the ED on 12/27 via EMS for evaluation of acute onset of left-sided weakness, slurred speech progressing to nonverbal state, and an initial rightward gaze. Patient was last seen well at 17:00 on 02/01/2021 when she asked her husband for help walking to the restroom which he thought was unusual. While she was walking, she collapsed and her husband had to catch her. He subsequently activated EMS who noted her symptoms as above and activated a Code Stroke.  NIHSS at admission was 20, baseline modified Rankin scale 0. Head CT showed no evidence of large acute territory infarct or hemorrhage and she received TNK at 18:50 on 02/01/2021. CT angiogram of the head and neck showed occlusion of the mid to distal basilar artery extending to the right PCA. She was then transferred to our service for a diagnostic cerebral angiogram and mechanical thrombectomy. EXAM: ULTRASOUND-GUIDED VASCULAR ACCESS DIAGNOSTIC CEREBRAL ANGIOGRAM MECHANICAL THROMBECTOMY FLAT PANEL HEAD CT INTRACRANIAL STENTING COMPARISON:  CT angiogram of the head and neck February 12 2021 MEDICATIONS: Cangrelor IV bolus and drip. ANESTHESIA/SEDATION: The procedure was performed in the general anesthesia. CONTRAST:  75 mL of Omnipaque 300 mg/mL FLUOROSCOPY TIME:  Fluoroscopy Time: 89 minutes 54 seconds (1,853 mGy). COMPLICATIONS: None immediate. TECHNIQUE: Informed written consent was due to waved due to emergency nature of intervention while patient  was obtunded and no healthcare proxy was available for consent. Maximal Sterile Barrier Technique was utilized including caps, mask, sterile gowns, sterile gloves, sterile drape, hand hygiene and skin antiseptic. A timeout was performed prior to the initiation of the procedure. The right groin was prepped and draped in the usual sterile fashion. Using a micropuncture kit and the modified Seldinger technique, access was gained to the right common femoral artery and an 8 French sheath was placed. Real-time ultrasound guidance was utilized for vascular access including the acquisition of a permanent ultrasound image documenting patency of the accessed vessel. Under fluoroscopy, a Zoom 88 guide catheter was navigated over a 6 [place] Berenstein 2 catheter and a 0.035" Terumo Glidewire into the aortic arch. The catheter was placed into the left subclavian artery and then advanced into the left vertebral artery. The diagnostic catheter was removed. Frontal and lateral  angiograms of the head were obtained. FINDINGS: 1. The right common femoral artery is patent with adequate caliber for vascular access. 2. Nonocclusive filling defect within the distal basilar artery with occlusion of the right P1/PCA and severe stenosis at the left P1-P2 junction. PROCEDURE: Under biplane roadmap, a zoom 71 aspiration catheter was navigated over a gated microcatheter and a Aristotle 14 microguidewire into the basilar artery. The microcatheter was then navigated over the wire into the distal right P2/PCA. Then, a 4 x 40 mm solitaire stent retriever was deployed spanning the right P2 segment. The device was allowed to intercalated with the clot for 4 minutes. The microcatheter was removed. The aspiration catheter was advanced near the level of occlusion and connected to a penumbra aspiration pump. The thrombectomy device and aspiration catheter were removed under constant aspiration. Left vertebral artery angiogram with frontal and lateral views showed recanalization of the right PCA now with occlusion of proximal left P1/PCA. Under biplane roadmap, a zoom 71 aspiration catheter was navigated over a phenom 21 microcatheter and a Aristotle 14 microguidewire into the basilar artery. The microcatheter was then navigated over the wire into the distal left P2/PCA. Then, a 4 x 40 mm solitaire stent retriever was deployed spanning the left P2 segment. The device was allowed to intercalated with the clot for 4 minutes. The microcatheter was removed. The aspiration catheter was advanced near the level of occlusion and connected to a penumbra aspiration pump. The thrombectomy device and aspiration catheter were removed under constant aspiration. Left vertebral artery angiogram showed recanalization of the left PCA with residual filling defect at the left P1-P2 junction resulting in severe stenosis. Follow-up angiogram showed worsening of the stenosis with minimal anterograde flow. Under biplane roadmap, a zoom  71 aspiration catheter was navigated over a phenom 21 microcatheter and a Aristotle 14 microguidewire into the basilar artery. The microcatheter was then navigated over the wire into the distal left P2/PCA. Then, a 4 x 40 mm solitaire stent retriever was deployed spanning the left P2 segment. The device was allowed to intercalated with the clot for 4 minutes. The microcatheter was removed. The aspiration catheter was advanced near the level of occlusion and connected to a penumbra aspiration pump. The thrombectomy device and aspiration catheter were removed under constant aspiration. Left vertebral artery angiogram showed occlusion of the distal left P1/PCA segment. Under biplane roadmap, a zoom 71 aspiration catheter was navigated over a phenom 21 microcatheter and a Aristotle 14 microguidewire into the basilar artery. The microcatheter was then navigated over the wire into the distal left P2/PCA. Then, a 3 mm solitaire head stent retriever  was deployed spanning the left P2 segment. The device was allowed to intercalated with the clot for 4 minutes. The microcatheter was removed. The aspiration catheter was advanced near the level of occlusion and connected to a penumbra aspiration pump. The thrombectomy device and aspiration catheter were removed under constant aspiration. Left vertebral artery angiogram showed persistent near occlusion of the left PCA at the P1-P2 junction. Flat panel CT of the head was obtained and post processed in a separate workstation with concurrent attending physician supervision. Selected images were sent to PACS. Contrast staining in the right thalamus was noted suggesting ongoing ischemia. No procedure related hemorrhagic complication. Using biplane roadmap, a zoom 71 aspiration catheter was navigated over a phenom 21 microcatheter and a synchro 2 microguidewire into the basilar artery. The microcatheter was then navigated over the wire into the distal left P2/PCA. Then, a 4 x 40 mm  solitaire stent retriever was deployed spanning the left P2 segment. The device was allowed to intercalated with the clot for 4 minutes. The microcatheter was removed. The aspiration catheter was advanced near the level of occlusion and connected to a penumbra aspiration pump. The thrombectomy device and aspiration catheter were removed under constant aspiration. Left vertebral artery angiogram showed occlusion of the left PCA at the P1-P2 junction. At this point, a cangrelor bolus was administered intravenously followed by IV drip. Then, an a prowler ex microcatheter was navigated over a synchro 2 select microguidewire into the distal left P2/PCA. Then, a 4 x 16 mm enterprise stent was deployed spanning the left P1 and proximal P2 segment. Left vertebral artery angiogram showed adequate stent position with complete revascularization of the left PCA. Delayed left vertebral artery angiogram showed persistent stent patency. Right common vertebral artery angiogram was obtained with right anterior oblique view. The puncture is at the level of the common femoral artery which shows moderate vasospasm with preserved anterograde flow. The 25 cm femoral sheath was then exchanged over the wire for a 10 cm sheath. Then, a 7 [place] Exoseal was utilized for access closure followed by manual pressure. Adequate hemostasis achieved. IMPRESSION: 1. Mechanical thrombectomy performed for treatment of bilateral PCA occlusion followed by left PCA stenting resulting in complete revascularization of both posterior cerebral arteries. 2. No evidence of hemorrhagic complication. PLAN: Dual anti-platelet therapy to be decided based on follow-up head CT. Electronically Signed   By: Pedro Earls M.D.   On: 02/14/2021 11:12    PHYSICAL EXAM General:  Intubated, well-developed, well-nourished female in no acute distress  Neurological:  PERRL, EOMI. Right-sided horizontal nystagmus present, blinks to threat on both sides.  Cough and gag reflexes present.  Able to move all extremities in response to commands.  ASSESSMENT/PLAN Taylor [name] is a 55 y.o. female with history of CHF, T2DM and OSA presenting with acute onset left-sided weakness, right gaze deviation and slurred speech. She was found to have occlusion of the distal basilar artery, right P1 and proximal right P2.  She was given TNK and received mechanical thrombectomy to the right and left PCA with stenting on the left.  She remains intubated and has not yet been able to wean from the ventilator.  She was found to have two apical thrombi on her 2D echocardiogram today.  Stroke:  embolic shower with b/l PCA occlusion s/p IR with TICI3 and left PCA stenting, likely secondary due to embolism from low EF and apical thrombi Code Stroke CT head No acute abnormality.  ASPECTS 10.  CTA head & neck nonopacification of distal basilar artery, right P1 and P2, poor opacification of right vertebral artery, focal stenosis in right A1 and A3 Post IR CT focal hyperattenuation in right thalamus, likely contrast material MRI  Acute right PCA territory infarct, in right thalamus and occipital lobe, small acute infarcts in bilateral cerebellar hemispheres with scattered small acute infarcts in bilateral parietal lobes, left occipital and left frontal lobes, area of susceptibility infarct in right occipital lobe suggestive of petechial hemorrhage MRA thrombectomy and placement of left P1/P2 stent with good flow related signal in distal left PCA, poor flow signal in distal basilar tip, poor opacification of right vertebral artery, right A1 and A3 ACA stenosis 2D Echo global hypokinesis with inferior akinesis, EF 10-27%, grade 3 diastolic dysfunction, thrombi in inferoapical and anteroapical areas, no atrial level shunt LDL 151 HgbA1c 11.9 VTE prophylaxis - SCDs    Diet   Diet NPO time specified   aspirin 81 mg daily prior to admission, now on aspirin 81 mg daily and  Brilinta (ticagrelor) 90 mg bid. 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% Two apical thrombi present Consider systemic anticoagulation with heparin for apical thrombi  Hypertension Home meds:  lisinopril 2.5 mg daily Stable SBP 120-140 Long-term BP goal normotensive  Hyperlipidemia Home meds:  atorvastatin 40 mg daily, resumed in hospital LDL 151, goal < 70 Increase to 80 mg atorvastatin daily  Continue statin at discharge  Diabetes type II Uncontrolled Home meds:  Jardiance 25 mg daily, levemir 17 units BID, metformin 1000 mg BID HgbA1c 11.9, goal < 7.0 CBGs Diabetes coordinator consult SSI  Other Stroke Risk Factors Former cigarette smoker Obstructive sleep apnea Congestive heart failure  Other Active Problems Respiratory failure Currently intubated and ventilated Management per CCM Aim to extubate tomorrow Leukocytosis WBC 10.8-> 11.9  Hospital day # [place] , MSN, AGACNP-BC [facility] See Amion for schedule and pager information 02/14/2021 3:45 PM   ATTENDING NOTE: I reviewed above note and agree with the assessment and plan. Pt was seen and examined.   RN at bedside.  Patient still intubated, off sedation, failed weaning trial yesterday and today due to tachypnea.  However, opens eyes on voice, follows some commands, moving all extremities.  Started heparin IV yesterday, continued on Brilinta.  However, patient still have persistent hypotension, resumed Levophed and increase midodrine to 10 mg every 8 hours.  BP goal 1 10-1 40.  Continue Lipitor and tube feeding.  For detailed assessment and plan, please refer to above as I have made changes wherever appropriate.   Rosalin Hawking, MD PhD Stroke Neurology 02/14/2021 7:36 PM  This patient is critically ill due to embolic shower, LV  thrombus, bilateral PCA occlusion status post thrombectomy and stenting, cardiomyopathy with low EF and at significant risk of neurological worsening, death form recurrent stroke, hemorrhagic transformation, heart failure, aspiration. This patient's care requires constant monitoring of vital signs, hemodynamics, respiratory and cardiac monitoring, review of multiple databases, neurological assessment, discussion with family, other specialists and medical decision making of high complexity. I spent 35 minutes of neurocritical care time in the care of this patient.    To contact Stroke Continuity provider, please refer to [email or web address] After hours, contact General Neurology

## 2021-02-14 NOTE — TOC CAGE-AID Note (Signed)
Transition of Care [facility]) - CAGE-AID Screening   Patient Details  Name: Taylor [name] MRN: [id number] Date of Birth: November 23, 1965  Transition of Care [facility]) CM/SW Contact:    Fabiano Ginley C Tarpley-Carter, LCSWA Phone Number: 02/14/2021, 12:12 PM   Clinical Narrative: Pt is unable to participate in Cage Aid.  Kayden Amend Tarpley-Carter, MSW, LCSW-A Pronouns:  She/Her/Hers [place] Transitions of Care Clinical Social Worker Direct Number:  [phone] [email or web address]  CAGE-AID Screening: Substance Abuse Screening unable to be completed due to: : Patient unable to participate             Substance Abuse Education Offered: No

## 2021-02-14 NOTE — Evaluation (Signed)
Occupational Therapy Evaluation Patient Details Name: Taylor [name] MRN: [id number] DOB: 22-May-1965 Today's Date: 02/14/2021   History of Present Illness 55 y/o female presented to ED on 12/27 for L sided paralysis, R gaze, slurred speech, and L facial droop. TNK given. S/p mechanical thrombectomy of bilateral occluded P1/PCA. MRI showed acute R PCA infarcts including infarcts in the R thalamus and R occipital lobe with mild mass effect with partial effacement of 3rd ventricle, numerous small acute infarcts in bilateral cerebellar hemispheres and few scattered small acute infarcts in bilateral parietal lobes, L occipital and L frontal lobes. Intubated 12/27. PMH: CHF, sleep apnea, type 2 DM   Clinical Impression   Patient admitted for the above diagnosis.  PTA is appears to have been living with her spouse in an apartment.  She was independent with ADL/IADL and mobility.  Patient was inconsistent with command following and answering questions with head nods, so information may need to be verified.  Deficits impacting independence are listed below.  Currently she is vented and total assist with mobility and ADL completion at bedlevel.  OT to continue efforts in the acute setting, and AIR is being recommended for post acute rehab.          Recommendations for follow up therapy are one component of a multi-disciplinary discharge planning process, led by the attending physician.  Recommendations may be updated based on patient status, additional functional criteria and insurance authorization.   Follow Up Recommendations  Acute inpatient rehab (3hours/day)    Assistance Recommended at Discharge Frequent or constant Supervision/Assistance  Functional Status Assessment  Patient has had a recent decline in their functional status and demonstrates the ability to make significant improvements in function in a reasonable and predictable amount of time.  Equipment Recommendations  Tub/shower  seat;Wheelchair cushion (measurements OT);Wheelchair (measurements OT)    Recommendations for Other Services       Precautions / Restrictions Precautions Precautions: Fall Precaution Comments: vent Restrictions Weight Bearing Restrictions: No      Mobility Bed Mobility               General bed mobility comments: placed in chair position in bed with good tolerance and VSS    Transfers                          Balance                                           ADL either performed or assessed with clinical judgement   ADL                                         General ADL Comments: Intubated with decreased AROM to LUE.  Patient is total assist for ADL at this time.     Vision   Vision Assessment?: Yes Alignment/Gaze Preference: Within Defined Limits Tracking/Visual Pursuits: Decreased smoothness of horizontal tracking;Decreased smoothness of vertical tracking     Perception Perception Perception: Not tested   Praxis Praxis Praxis: Not tested    Pertinent Vitals/Pain Pain Assessment: Faces Faces Pain Scale: No hurt Facial Expression: Relaxed, neutral Body Movements: Absence of movements Muscle Tension: Relaxed Compliance with ventilator (intubated pts.): Tolerating ventilator or movement Vocalization (extubated pts.): N/A CPOT  Total: 0 Pain Intervention(s): Monitored during session     Hand Dominance Right   Extremity/Trunk Assessment Upper Extremity Assessment Upper Extremity Assessment: Generalized weakness;LUE deficits/detail LUE Deficits / Details: able to move L UE, but appears weaker than R UE LUE Coordination: decreased fine motor;decreased gross motor   Lower Extremity Assessment Lower Extremity Assessment: Defer to PT evaluation       Communication Communication Communication: Other (comment)   Cognition Arousal/Alertness: Awake/alert Behavior During Therapy: Flat affect Overall  Cognitive Status: Difficult to assess                                 General Comments: following 1 step commands inconsistently. Answers yes/no questions intermittently     General Comments  VSS on CPAP vent settings    Exercises     Shoulder Instructions      Home Living Family/patient expects to be discharged to:: Private residence Living Arrangements: Spouse/significant other Available Help at Discharge: Family Type of Home: Apartment Home Access: Level entry     Home Layout: One level     Bathroom Shower/Tub: Tub/shower unit         Home Equipment: None   Additional Comments: some information obtained from previous admission. Difficult to obtain all information due to patient being intubated      Prior Functioning/Environment Prior Level of Function : Independent/Modified Independent                        OT Problem List: Decreased strength;Decreased range of motion;Decreased activity tolerance;Decreased coordination;Decreased cognition      OT Treatment/Interventions: Self-care/ADL training;Therapeutic exercise;DME and/or AE instruction;Cognitive remediation/compensation;Therapeutic activities;Balance training;Patient/family education    OT Goals(Current goals can be found in the care plan section) Acute Rehab OT Goals Patient Stated Goal: none stated OT Goal Formulation: With patient Time For Goal Achievement: 02/28/21 Potential to Achieve Goals: Fair ADL Goals Pt Will Perform Grooming: with min assist;sitting Pt Will Perform Upper Body Bathing: with mod assist;sitting Pt Will Perform Upper Body Dressing: with mod assist;sitting Additional ADL Goal #1: Tolerate sitting edge of bed for up to 5 min with min A for balance to increase upper body ADL independence.  OT Frequency: Min 2X/week   Barriers to D/C:    none noted       Co-evaluation PT/OT/SLP Co-Evaluation/Treatment: Yes Reason for Co-Treatment: Complexity of the  patient's impairments (multi-system involvement);For patient/therapist safety;To address functional/ADL transfers PT goals addressed during session: Strengthening/ROM OT goals addressed during session: ADL's and self-care;Strengthening/ROM      AM-PAC OT "6 Clicks" Daily Activity     Outcome Measure Help from another person eating meals?: Total Help from another person taking care of personal grooming?: Total Help from another person toileting, which includes using toliet, bedpan, or urinal?: Total Help from another person bathing (including washing, rinsing, drying)?: Total Help from another person to put on and taking off regular upper body clothing?: Total Help from another person to put on and taking off regular lower body clothing?: Total 6 Click Score: 6   End of Session    Activity Tolerance: Treatment limited secondary to medical complications (Comment) Patient left: in bed;with call bell/phone within reach  OT Visit Diagnosis: Muscle weakness (generalized) (M62.81);Other symptoms and signs involving cognitive function;Hemiplegia and hemiparesis Hemiplegia - Right/Left: Left Hemiplegia - dominant/non-dominant: Non-Dominant Hemiplegia - caused by: Cerebral infarction  Time: 1538-1600 OT Time Calculation (min): 22 min Charges:  OT General Charges $OT Visit: 1 Visit OT Evaluation $OT Eval Moderate Complexity: 1 Mod  02/14/2021  RP, OTR/L  Acute Rehabilitation Services  Office:  [phone]   Suzanna Obey 02/14/2021, 4:19 PM

## 2021-02-14 NOTE — Progress Notes (Signed)
Alerted MD that blood pressure was hypotensive despite ordered midodrine.  Was given orders to restart levophed gtt and titrate for sys>110.

## 2021-02-14 NOTE — Progress Notes (Signed)
Inpatient Diabetes Program Recommendations  AACE/ADA: New Consensus Statement on Inpatient Glycemic Control (2015)  Target Ranges:  Prepandial:   less than 140 mg/dL      Peak postprandial:   less than 180 mg/dL (1-2 hours)      Critically ill patients:  140 - 180 mg/dL   Lab Results  Component Value Date   GLUCAP 112 (H) 02/14/2021   HGBA1C 11.9 (H) 02/13/2021    Review of Glycemic Control  Latest Reference Range & Units 02/13/21 20:33 02/13/21 23:47 02/14/21 03:40 02/14/21 07:39  Glucose-Capillary 70 - 99 mg/dL 88 78 747 (H) 340 (H)  (H): Data is abnormally high Diabetes history: DM2 Outpatient Diabetes medications: Levemir 17 units bid, Jardiance 25, Amaryl 4 mg bid, Metformin 1 gm bid Current orders for Inpatient glycemic control: Novolog correction 0-15 units q 4 hrs.   Inpatient Diabetes Program Recommendations:   Noted consult. Following. Patient had hypoglycemia of 57 post correction. Consider: -Decrease Novolog correction to 0-9 sensitive scale q 4 hrs.  Thanks, Lujean Rave, MSN, RNC-OB Diabetes Coordinator [phone] (8a-5p)

## 2021-02-14 NOTE — Progress Notes (Signed)
PT Cancellation Note  Patient Details Name: Taylor [name] MRN: [id number] DOB: [birth year]   Cancelled Treatment:    Reason Eval/Treat Not Completed: Patient not medically ready Patient weaning this AM and not tolerating well per RN. PT will re-attempt evaluation as time allows and patient becomes more appropriate.   Laurie Penado A. Dan Humphreys PT, DPT Acute Rehabilitation Services Pager [phone] Office [phone]    Taylor [name] 02/14/2021, 11:12 AM

## 2021-02-14 NOTE — Progress Notes (Signed)
Alerted Stroke MD, Dr. Roda Shutters, that patient was hypotensive 89/57 (69).  Was given orders for 10mg   midodrine q8h.  As well as a one time dose of midrodrine for 5mg  now.  Will administer medication and continue to assess and act accordingly.

## 2021-02-14 NOTE — Progress Notes (Addendum)
Referring Physician(s): CODE STROKE  Supervising Physician: Baldemar Lenise Macedo Rodrigues, [place]ia  Patient Status:  [facility]  Chief Complaint:   Bilateral P1/PCA occlusions s/p intervention in NIR 01/21/2021 - Mechanical thrombectomies on the right and left. Left P1-P2/PCA stent placement.   Subjective:   On vent, sedated.  RN reports patient follows simple command, purposeful movement bilaterally with persistent weakness on left extremities.  DP dopplerable bilaterally.  Still on heparin drip, patient is on Brilinta 90 mg bid but not on ASA 81 mg    Allergies: Patient has no known allergies.  Medications: Prior to Admission medications   Medication Sig Start Date End Date Taking? Authorizing Provider  aspirin EC 81 MG EC tablet Take 1 tablet (81 mg total) by mouth daily. 09/04/18   Gouru, Deanna ArtisAruna, MD  atorvastatin (LIPITOR) 40 MG tablet Take 1 tablet (40 mg total) by mouth daily. 03/10/18   Delma FreezeHackney, Tina A, FNP  empagliflozin (JARDIANCE) 25 MG TABS tablet Take 1 tablet (25 mg total) by mouth daily. 11/30/20   Delma FreezeHackney, Tina A, FNP  furosemide (LASIX) 20 MG tablet Take 1 tablet (20 mg total) by mouth daily. Weight gain 2 pounds overnight, 5 pounds in a week, shortness of breath, or leg swelling 10/24/20   Arnetha CourserAmin, Sumayya, MD  glimepiride (AMARYL) 2 MG tablet Take 4 mg by mouth 2 (two) times daily. 02/15/15   [provider]  insulin detemir (LEVEMIR) 100 unit/ml SOLN Inject 17 Units into the skin 2 (two) times daily.     [provider]  lisinopril (ZESTRIL) 2.5 MG tablet Take 1 tablet (2.5 mg total) by mouth daily. 11/30/20   Delma FreezeHackney, Tina A, FNP  metFORMIN (GLUCOPHAGE) 1000 MG tablet Take 1 tablet (1,000 mg total) 2 (two) times daily with a meal by mouth. 12/31/16   Delma FreezeHackney, Tina A, FNP  potassium chloride SA (KLOR-CON) 20 MEQ tablet On day 1, you will take 2 tablets in the AM and 2 tablets in the PM. Starting day 2, you will take 1 tablet every day 01/14/21   Delma FreezeHackney, Tina  A, FNP     Vital Signs: BP 109/77    Pulse 83    Temp (!) 97.4 F (36.3 C) (Oral)    Resp (!) 22    Ht 5\' 1"  (1.549 m)    Wt 139 lb 12.4 oz (63.4 kg)    SpO2 100%    BMI 26.41 kg/m   Physical Exam Vitals reviewed.  Constitutional:      General: She is not in acute distress.    Appearance: She is not ill-appearing.  Cardiovascular:     Rate and Rhythm: Normal rate and regular rhythm.     Comments: Right groin vascular site is soft and dry.  Pulmonary:     Comments: intubated Skin:    General: Skin is warm.  Neurological:     Mental Status: She is alert.     Comments: Able to move all extremities, weaker on the left.  Able to follow simple commands.     Imaging: DG Abd 1 View  Result Date: 02/13/2021 CLINICAL DATA:  Enteric catheter placement EXAM: ABDOMEN - 1 VIEW COMPARISON:  None. FINDINGS: Frontal view of the lower chest and upper abdomen demonstrates enteric catheter passing below diaphragm, coiled over the gastric body. Tip and side port project over the gastric fundus. Bowel gas pattern is unremarkable. Excreted contrast within the kidneys, ureters, and bladder from previous contrasted evaluation. IMPRESSION: 1. Enteric catheter tip projecting over  gastric fundus. Electronically Signed   By: Randa Ngo M.D.   On: 02/13/2021 01:34   CT HEAD WO CONTRAST (5MM)  Result Date: 02/13/2021 CLINICAL DATA:  Stroke follow-up EXAM: CT HEAD WITHOUT CONTRAST TECHNIQUE: Contiguous axial images were obtained from the base of the skull through the vertex without intravenous contrast. COMPARISON:  02/11/2021 at 6:30 p.m. FINDINGS: Brain: Focal hyperattenuation in the ventral right thalamus. There is an old infarct of the left frontal lobe. There is periventricular hypoattenuation compatible with chronic microvascular disease. Vascular: Atherosclerotic calcification of the internal carotid arteries at the skull base. No abnormal hyperdensity of the major intracranial arteries or dural  venous sinuses. Skull: The visualized skull base, calvarium and extracranial soft tissues are normal. Sinuses/Orbits: No fluid levels or advanced mucosal thickening of the visualized paranasal sinuses. No mastoid or middle ear effusion. The orbits are normal. IMPRESSION: 1. Focal hyperattenuation in the ventral right thalamus, likely contrast material. 2. 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 Electronically Signed   By: Ulyses Jarred M.D.   On: 02/13/2021 00:33   ECHOCARDIOGRAM COMPLETE  Result Date: 02/13/2021    ECHOCARDIOGRAM REPORT   Patient Name:   Taylor [name] Date of Exam: 02/13/2021 Medical Rec #:  [id number]    Height:       61.0 in Accession #:    [id number]   Weight:       139.8 lb Date of Birth:  February 28, 1965    BSA:          1.622 m Patient Age:    55 years     BP:           115/74 mmHg Patient Gender: F            HR:           84 bpm. Exam Location:  Inpatient Procedure: 2D Echo, Color Doppler, Cardiac Doppler and Intracardiac            Opacification Agent Indications:    Stroke  History:        Patient has prior history of Echocardiogram examinations. CHF;                 Risk Factors:Diabetes and Sleep Apnea.  Sonographer:    Jyl Heinz Referring Phys: [id number] Green XU IMPRESSIONS  1. Global hypokinesis with inferior akinesis. Inferoapical thrombus measuring 2.4 cm x 1.6 cm. Anteroapical thrombus measuring 1.1 x 1.1 cm. Left ventricular ejection fraction, by estimation, is 20 to 25%. Left ventricular ejection fraction by 3D volume  is 24 %. Left ventricular ejection fraction by 2D MOD biplane is 21.4 %. Left ventricular ejection fraction by PLAX is 18 %. The left ventricle has severely decreased function. The left ventricle demonstrates global hypokinesis. The left ventricular internal cavity size was severely dilated. Left ventricular diastolic parameters are consistent with Grade III diastolic dysfunction (restrictive). Elevated left ventricular  end-diastolic pressure.  2. Right ventricular systolic function is moderately reduced. The right ventricular size is normal. There is moderately elevated pulmonary artery systolic pressure.  3. Left atrial size was severely dilated.  4. The mitral valve is normal in structure. Mild mitral valve regurgitation. No evidence of mitral stenosis.  5. Tricuspid valve regurgitation is moderate.  6. The aortic valve is tricuspid. Aortic valve regurgitation is not visualized. No aortic stenosis is present.  7. The inferior vena cava is dilated in size with <50% respiratory variability, suggesting right atrial pressure of 15 mmHg. Comparison(s): Apical  thrombi are new compared with echo 10/2020. FINDINGS  Left Ventricle: Global hypokinesis with inferior akinesis. Inferoapical thrombus measuring 2.4 cm x 1.6 cm. Anteroapical thrombus measuring 1.1 x 1.1 cm. Left ventricular ejection fraction, by estimation, is 20 to 25%. Left ventricular ejection fraction  by PLAX is 18 %. Left ventricular ejection fraction by 2D MOD biplane is 21.4 %. Left ventricular ejection fraction by 3D volume is 24 %. The left ventricle has severely decreased function. The left ventricle demonstrates global hypokinesis. The left ventricular internal cavity size was severely dilated. There is no left ventricular hypertrophy. Left ventricular diastolic parameters are consistent with Grade III diastolic dysfunction (restrictive). Elevated left ventricular end-diastolic pressure. Right Ventricle: The right ventricular size is normal. No increase in right ventricular wall thickness. Right ventricular systolic function is moderately reduced. There is moderately elevated pulmonary artery systolic pressure. The tricuspid regurgitant velocity is 2.75 m/s, and with an assumed right atrial pressure of 15 mmHg, the estimated right ventricular systolic pressure is 123456 mmHg. Left Atrium: Left atrial size was severely dilated. Right Atrium: Right atrial size was normal  in size. Pericardium: Trivial pericardial effusion is present. Mitral Valve: The mitral valve is normal in structure. Mild mitral valve regurgitation. No evidence of mitral valve stenosis. Tricuspid Valve: The tricuspid valve is normal in structure. Tricuspid valve regurgitation is moderate . No evidence of tricuspid stenosis. Aortic Valve: The aortic valve is tricuspid. Aortic valve regurgitation is not visualized. No aortic stenosis is present. Aortic valve peak gradient measures 3.1 mmHg. Pulmonic Valve: The pulmonic valve was normal in structure. Pulmonic valve regurgitation is mild to moderate. No evidence of pulmonic stenosis. Aorta: The aortic root is normal in size and structure. Venous: The inferior vena cava is dilated in size with less than 50% respiratory variability, suggesting right atrial pressure of 15 mmHg. IAS/Shunts: No atrial level shunt detected by color flow Doppler.  LEFT VENTRICLE PLAX 2D                        Biplane EF (MOD) LV EF:         Left            LV Biplane EF:   Left                ventricular                      ventricular                ejection                         ejection                fraction by                      fraction by                PLAX is 18                       2D MOD                %.                               biplane is LVIDd:  6.00 cm                          21.4 %. LVIDs:         5.50 cm LV PW:         0.90 cm         Diastology LV IVS:        0.80 cm         LV e' medial:    2.84 cm/s LVOT diam:     2.00 cm         LV E/e' medial:  23.9 LV SV:         17              LV e' lateral:   2.84 cm/s LV SV Index:   11              LV E/e' lateral: 23.9 LVOT Area:     3.14 cm                                 3D Volume EF LV Volumes (MOD)               LV 3D EF:    Left LV vol d, MOD    222.0 ml                   ventricul A2C:                                        ar LV vol d, MOD    188.0 ml                   ejection A4C:                                         fraction LV vol s, MOD    184.0 ml                   by 3D A2C:                                        volume is LV vol s, MOD    143.0 ml                   24 %. A4C: LV SV MOD A2C:   38.0 ml LV SV MOD A4C:   188.0 ml      3D Volume EF: LV SV MOD BP:    46.1 ml       3D EF:        24 %                                LV EDV:       223 ml                                LV ESV:  170 ml                                LV SV:        53 ml RIGHT VENTRICLE            IVC RV Basal diam:  4.30 cm    IVC diam: 2.50 cm RV Mid diam:    3.10 cm RV S prime:     6.81 cm/s TAPSE (M-mode): 1.1 cm LEFT ATRIUM             Index        RIGHT ATRIUM           Index LA diam:        4.40 cm 2.71 cm/m   RA Area:     15.80 cm LA Vol (A2C):   78.6 ml 48.46 ml/m  RA Volume:   42.40 ml  26.14 ml/m LA Vol (A4C):   59.5 ml 36.68 ml/m LA Biplane Vol: 70.4 ml 43.40 ml/m  AORTIC VALVE AV Area (Vmax): 1.44 cm AV Vmax:        87.60 cm/s AV Peak Grad:   3.1 mmHg LVOT Vmax:      40.20 cm/s LVOT Vmean:     26.800 cm/s LVOT VTI:       0.055 m  AORTA Ao Root diam: 2.30 cm Ao Asc diam:  2.80 cm MITRAL VALVE               TRICUSPID VALVE MV Area (PHT): 5.93 cm    TR Peak grad:   30.2 mmHg MV Decel Time: 128 msec    TR Vmax:        275.00 cm/s MR Peak grad: 36.8 mmHg MR Vmax:      303.50 cm/s  SHUNTS MV E velocity: 67.90 cm/s  Systemic VTI:  0.06 m MV A velocity: 20.90 cm/s  Systemic Diam: 2.00 cm MV E/A ratio:  3.25 Skeet Latch MD Electronically signed by Skeet Latch MD Signature Date/Time: 02/13/2021/11:45:04 AM    Final    CT HEAD CODE STROKE WO CONTRAST  Result Date: 01/31/2021 CLINICAL DATA:  Code stroke. EXAM: CT HEAD WITHOUT CONTRAST TECHNIQUE: Contiguous axial images were obtained from the base of the skull through the vertex without intravenous contrast. COMPARISON:  10/21/2020 FINDINGS: Brain: No evidence of acute infarction, hemorrhage, cerebral edema, mass, mass effect, or midline shift. Ventricles  and sulci are normal for age. No extra-axial fluid collection. Redemonstrated chronic hypodensity in the left frontal lobe and right caudate head. Vascular: No hyperdense vessel or unexpected calcification. Skull: Normal. Negative for fracture or focal lesion. Sinuses/Orbits: No acute finding. Other: The mastoid air cells are well aerated. ASPECTS [facility] Stroke Program Early CT Score) - Ganglionic level infarction (caudate, lentiform nuclei, internal capsule, insula, M1-M3 cortex): 7 - Supraganglionic infarction (M4-M6 cortex): 3 Total score (0-10 with 10 being normal): 10 IMPRESSION: 1. No acute intracranial process. 2. ASPECTS is 10 Code stroke imaging results were communicated on 02/05/2021 at 6:41 pm to provider [facility] via secure text paging. Electronically Signed   By: Merilyn Baba M.D.   On: 01/23/2021 18:41   CT ANGIO HEAD NECK W WO CM (CODE STROKE)  Result Date: 02/14/2021 CLINICAL DATA:  Stroke code, neuro deficit, acute EXAM: CT ANGIOGRAPHY HEAD AND NECK TECHNIQUE: Multidetector CT imaging of the head and neck was performed using the standard protocol during bolus administration of intravenous contrast. Multiplanar  CT image reconstructions and MIPs were obtained to evaluate the vascular anatomy. Carotid stenosis measurements (when applicable) are obtained utilizing NASCET criteria, using the distal internal carotid diameter as the denominator. CONTRAST:  64mL OMNIPAQUE IOHEXOL 350 MG/ML SOLN COMPARISON:  No prior CTA, correlation is made with CT head 02/04/2021 and MR head 10/21/2020 FINDINGS: CT HEAD FINDINGS For noncontrast findings, please see same day CT head. CTA NECK FINDINGS Aortic arch: Standard branching. Imaged portion shows no evidence of aneurysm or dissection. No significant stenosis of the major arch vessel origins. Aortic atherosclerosis. Right carotid system: No evidence of dissection, stenosis (50% or greater) or occlusion. Left carotid system: No evidence of dissection, stenosis  (50% or greater) or occlusion. Vertebral arteries: Codominant. No evidence of dissection, stenosis (50% or greater) or occlusion. Skeleton: No acute osseous abnormality. Other neck: Negative. Upper chest: Right pleural effusion. No left pleural effusion. Hazy opacities, possibly pulmonary edema. Review of the MIP images confirms the above findings CTA HEAD FINDINGS Anterior circulation: Both internal carotid arteries are patent to the termini, mild calcifications in the cavernous segment and more significant calcifications in the right-greater-than-left supraclinoid segments, which cause moderate narrowing. Focal stenosis in the right A1 origin (series 7, image 104). Diminutive but patent left A1. Normal anterior communicating artery. Focal poor opacification of the right A3 (series 7, image 90) anterior cerebral arteries are otherwise patent to their distal aspects. No significant M1 stenosis or occlusion. Normal MCA bifurcations. Distal MCA branches perfused and symmetric. Posterior circulation: Poor opacification of the right vertebral artery, after the right PICA takeoff, with only intermittent flow proximally and likely retrograde flow distally (series 7, image 127). The left vertebral artery is somewhat irregular but patent. Posterior inferior cerebral arteries patent bilaterally. The basilar artery is mildly irregular proximally and poorly opacified in the mid basilar (series 7, image 112), not definitively seen at its distal aspect. The left posterior communicating artery is patent and likely supplies the left P1 segment, which also likely retrograde supplies the left superior cerebellar artery. The left PCA is patent to its distal aspect. The right superior cerebellar artery and right P1 segment are non-opacified. There is distal reconstitution of the right PCA (series 7, image 100), likely secondary to collaterals. Venous sinuses: As permitted by contrast timing, patent. Anatomic variants: None significant  Review of the MIP images confirms the above findings IMPRESSION: 1. Non opacification of the distal basilar artery, right P1 and proximal P2, and right SCA, of indeterminate acuity. The distal right P2 reconstitutes, possibly secondary to collaterals. The left PCA is supplied by the left posterior communicating artery, with retrograde flow likely supplying the left superior cerebellar artery. 2. Poor opacification of the right vertebral artery, with only intermittent flow from the right PICA takeoff to the vertebrobasilar junction; distal right vertebral artery flow is favored to be retrograde. 3. Focal stenosis in the right A1 and A3. 4. Moderate narrowing of the right-greater-than-left supraclinoid ICA. 5. No hemodynamically significant stenosis in the neck. Code stroke imaging results were communicated on 01/28/2021 at 7:14 pm to provider [facility] via secure text paging. Electronically Signed   By: Merilyn Baba M.D.   On: 01/26/2021 19:16    Labs:  CBC: Recent Labs    10/21/20 1057 10/23/20 0409 02/04/2021 1823 02/09/2021 1831 02/09/2021 1940 02/13/21 0259 02/13/21 0533  WBC 10.2 9.5 7.1  --   --   --  10.8*  HGB 15.3* 15.3* 15.9* 17.7* 16.0* 14.3 13.8  HCT 47.1* 48.4* 49.7* 52.0* 47.0* 42.0  43.1  PLT 283 291 260  --   --   --  229    COAGS: Recent Labs    01/31/2021 1823  INR 0.9  APTT 25    BMP: Recent Labs    01/14/21 1511 01/18/21 1111 02/09/2021 1831 01/24/2021 1940 01/21/2021 2341 02/13/21 0259 02/13/21 0533  NA 134* 137 131* 136 134* 138 136  K 2.7* 4.1 6.6* 3.1* 2.9* 4.2 3.6  CL 97* 100 101 99 108  --  108  CO2 28 27  --   --  18*  --  21*  GLUCOSE 243* 157* 205* 172* 322*  --  96  BUN 10 10 20 12 11   --  9  CALCIUM 8.2* 8.6*  --   --  7.2*  --  7.8*  CREATININE 0.45 0.55 0.50 0.30* 0.48  --  0.39*  GFRNONAA >60 >60  --   --  NOT CALCULATED  --  >60    LIVER FUNCTION TESTS: Recent Labs    10/21/20 1057 01/25/2021 2341 02/13/21 0533  BILITOT 1.5* 1.5* 1.2  AST 14*  46* 27  ALT 12 29 28   ALKPHOS 108 110 113  PROT 6.1* 4.4* 4.6*  ALBUMIN 2.8* 2.0* 2.0*    Assessment and Plan:  Bilateral P1/PCA occlusions s/p intervention in NIR 01/19/2021 - Mechanical thrombectomies on the right and left with left P1-P2/PCA stent placement.  Patient remains intubated with sedation.  MRI/MRA yesterday showed patent left PCA stent with good flow.   Patient is on heparin drip due to  LV thrombus seen on echo, ASA was d/c'd by Stroke team.  Patient is on Brilinta 90 mg BID.   If patient is to be discharged with [facility], no need for ASA. Patient will remain on Brilinta 90 mg BID.  If patient is NOT being discharged with [facility], patient will need to be on ASA 81 mg and Brilinta 90 mg BID.  Further treatment plan per Stroke team.  Please call NIR for questions and concerns.   Electronically Signed: [facility], AGACNP-BC [phone] 02/14/2021, 9:47 AM   I spent a total of 15 Minutes at the the patient's bedside AND on the patient's hospital floor or unit, greater than 50% of which was counseling/coordinating care for Code stroke; thrombectomies with stent placement

## 2021-02-14 NOTE — Progress Notes (Signed)
NAME:  Taylor [name], MRN:  [id number], DOB:  04/07/1965, LOS: 2 ADMISSION DATE:  01/30/2021, CONSULTATION DATE:  12/27 REFERRING MD:  Dr Tommi Rumps Melchor Amour , CHIEF COMPLAINT:   CVA  History of Present Illness:  Patient is encephalopathic and/or intubated. Therefore history has been obtained from chart review. 55 year old female with PMH as below, which is significant for HFrEF with LVEF 20-25 % (2017), DM, and OSA on CPAP. 12/27 she presented to [facility] ED with complaints of acute onset left sided weakness and slurred speech. EMS noted non-verbal and right gaze preference. LKW 1700 at which time she fell walking to the restroom. She presented to [facility] as a code stroke. Imaging consistent with occlusion of the distal basilar arterty, right P1, and proximal R P2. She was given systemic TNK and was taken to IR for angiography and possible thrombectomy. She underwent mechanical thrombectomy performed with stent retriever on the right with complete recanalization after one pass (TICI3). Mechanical thrombectomy performed on the left with stent retriever with multiple passes performed due to recurrent occlusion. Complete recanalization of the left PCA achieved. Post op she was sent to the ICU on vent for recovery. PCCM consulted for vent management and ICU care.    Pertinent  Medical History   has a past medical history of CHF (congestive heart failure) (HCC), Diabetes mellitus without complication (HCC), and Obstructive sleep apnea.   Significant Hospital Events: Including procedures, antibiotic start and stop dates in addition to other pertinent events   12/27 admit for posterior circulation stroke. TNK given and IR throbmectomy 12/28 echo with LV thrombus, EF 20-25%.  Started on heparin  Interim History / Subjective:   Echo with LV thrombus.  Started on heparin Failed pressure support weans today due to tachypnea  Objective   Blood pressure 109/79, pulse (!) 103, temperature 99.9 F (37.7  C), temperature source Axillary, resp. rate (!) 27, height 5\' 1"  (1.549 m), weight 63.4 kg, SpO2 100 %.    Vent Mode: PRVC FiO2 (%):  [30 %-40 %] 30 % Set Rate:  [18 bmp] 18 bmp Vt Set:  [380 mL] 380 mL PEEP:  [5 cmH20] 5 cmH20 Plateau Pressure:  [17 cmH20-21 cmH20] 17 cmH20   Intake/Output Summary (Last 24 hours) at 02/14/2021 02/16/2021 Last data filed at 02/14/2021 0800 Gross per 24 hour  Intake 2185.57 ml  Output 1525 ml  Net 660.57 ml   Filed Weights   01/21/2021 1827 02/16/2021 2007 02/14/21 0500  Weight: 63.4 kg 63.4 kg 63.4 kg    Examination: Gen:      No acute distress HEENT:  EOMI, sclera anicteric Neck:     No masses; no thyromegaly, ETT Lungs:    Clear to auscultation bilaterally; normal respiratory effort CV:         Regular rate and rhythm; no murmurs Abd:      + bowel sounds; soft, non-tender; no palpable masses, no distension Ext:    No edema; adequate peripheral perfusion Skin:      Warm and dry; no rash Neuro: Sedated  Labs/imaging reviewed Significant sodium 134, WBC 11.9 no new imaging  Resolved Hospital Problem list     Assessment & Plan:  CVA: basilar, bilateral PI/PCA.  TNK given in ED and she is s/p mechanical thrombectomy on the right and mechanical thrombectomy with stent placement on the L.  Management per neurology  Acute resp failure, inability to protect airway due to stroke OSA on CPAP Continue vent support Failing  SBT's, wean as tolerated Follow intermittent chest x-ray Will need nocturnal CPAP after extubation  Chronic HFrEF: LVEF 20-25% LV thrombus Continue heparin drip Start normal saline Pressors to maintain goal MAP.  Suspect hypotension is due to combination of cardiomyopathy and sedation  DM SSI coverage Holding home glimepiride, levemir, metformin  Best Practice (right click and "Reselect all SmartList Selections" daily)   Diet/type: tubefeeds DVT prophylaxis: systemic heparin GI prophylaxis: PPI Lines: Arterial  Line Foley:  N/A Code Status:  full code Last date of multidisciplinary goals of care discussion [ ]   Critical care time:    The patient is critically ill with multiple organ system failure and requires high complexity decision making for assessment and support, frequent evaluation and titration of therapies, advanced monitoring, review of radiographic studies and interpretation of complex data.   Critical Care Time devoted to patient care services, exclusive of separately billable procedures, described in this note is 35 minutes.   Marshell Garfinkel MD [place] See Amion for pager  If no response to pager , please call [phone] until 7pm After 7:00 pm call Elink  [phone] 02/14/2021, 8:46 AM      ,

## 2021-02-15 ENCOUNTER — Inpatient Hospital Stay (HOSPITAL_COMMUNITY): Payer: 59

## 2021-02-15 DIAGNOSIS — I959 Hypotension, unspecified: Secondary | ICD-10-CM

## 2021-02-15 DIAGNOSIS — I255 Ischemic cardiomyopathy: Secondary | ICD-10-CM | POA: Diagnosis not present

## 2021-02-15 DIAGNOSIS — I513 Intracardiac thrombosis, not elsewhere classified: Secondary | ICD-10-CM | POA: Diagnosis not present

## 2021-02-15 DIAGNOSIS — I639 Cerebral infarction, unspecified: Secondary | ICD-10-CM | POA: Diagnosis not present

## 2021-02-15 DIAGNOSIS — J9600 Acute respiratory failure, unspecified whether with hypoxia or hypercapnia: Secondary | ICD-10-CM | POA: Diagnosis not present

## 2021-02-15 DIAGNOSIS — E11649 Type 2 diabetes mellitus with hypoglycemia without coma: Secondary | ICD-10-CM

## 2021-02-15 LAB — CBC
HCT: 43.9 % (ref 36.0–46.0)
HCT: 41.7 % (ref 36.0–46.0)
Hemoglobin: 14.2 g/dL (ref 12.0–15.0)
Hemoglobin: 13.4 g/dL (ref 12.0–15.0)
MCH: 29.2 pg (ref 26.0–34.0)
MCH: 28.9 pg (ref 26.0–34.0)
MCHC: 32.3 g/dL (ref 30.0–36.0)
MCHC: 32.1 g/dL (ref 30.0–36.0)
MCV: 90.3 fL (ref 80.0–100.0)
MCV: 89.9 fL (ref 80.0–100.0)
Platelets: 287 10*3/uL (ref 150–400)
Platelets: 236 10*3/uL (ref 150–400)
RBC: 4.86 MIL/uL (ref 3.87–5.11)
RBC: 4.64 MIL/uL (ref 3.87–5.11)
RDW: 16.5 % — ABNORMAL HIGH (ref 11.5–15.5)
RDW: 16.6 % — ABNORMAL HIGH (ref 11.5–15.5)
WBC: 15.3 10*3/uL — ABNORMAL HIGH (ref 4.0–10.5)
WBC: 12.7 10*3/uL — ABNORMAL HIGH (ref 4.0–10.5)
nRBC: 0 % (ref 0.0–0.2)
nRBC: 0 % (ref 0.0–0.2)

## 2021-02-15 LAB — GLUCOSE, CAPILLARY
Glucose-Capillary: 169 mg/dL — ABNORMAL HIGH (ref 70–99)
Glucose-Capillary: 239 mg/dL — ABNORMAL HIGH (ref 70–99)
Glucose-Capillary: 199 mg/dL — ABNORMAL HIGH (ref 70–99)
Glucose-Capillary: 182 mg/dL — ABNORMAL HIGH (ref 70–99)
Glucose-Capillary: 177 mg/dL — ABNORMAL HIGH (ref 70–99)
Glucose-Capillary: 133 mg/dL — ABNORMAL HIGH (ref 70–99)

## 2021-02-15 LAB — URINALYSIS, ROUTINE W REFLEX MICROSCOPIC
Bilirubin Urine: NEGATIVE
Glucose, UA: 50 mg/dL — AB
Ketones, ur: NEGATIVE mg/dL
Nitrite: NEGATIVE
Protein, ur: >=300 mg/dL — AB
RBC / HPF: >50 RBC/hpf — ABNORMAL HIGH (ref 0–5)
Specific Gravity, Urine: 1.022 (ref 1.005–1.030)
WBC, UA: >50 WBC/hpf — ABNORMAL HIGH (ref 0–5)
pH: 5 (ref 5.0–8.0)

## 2021-02-15 LAB — BASIC METABOLIC PANEL
Anion gap: 7 (ref 5–15)
BUN: 12 mg/dL (ref 6–20)
CO2: 25 mmol/L (ref 22–32)
Calcium: 7.9 mg/dL — ABNORMAL LOW (ref 8.9–10.3)
Chloride: 107 mmol/L (ref 98–111)
Creatinine, Ser: 0.57 mg/dL (ref 0.44–1.00)
GFR, Estimated: >60 mL/min (ref >60)
Glucose, Bld: 179 mg/dL — ABNORMAL HIGH (ref 70–99)
Potassium: 4.4 mmol/L (ref 3.5–5.1)
Sodium: 139 mmol/L (ref 135–145)

## 2021-02-15 LAB — HEPARIN LEVEL (UNFRACTIONATED): Heparin Unfractionated: 0.4 IU/mL (ref 0.30–0.70)

## 2021-02-15 MED ORDER — SODIUM CHLORIDE 0.9 % IV SOLN
2.0000 g | Freq: Three times a day (TID) | INTRAVENOUS | Status: DC
  Administered 2021-02-15 – 2021-02-17 (×6): 2 g via INTRAVENOUS
  Filled 2021-02-15 (×5): qty 2

## 2021-02-15 MED ORDER — INSULIN ASPART 100 UNIT/ML IJ SOLN
2.0000 [IU] | INTRAMUSCULAR | Status: DC
  Administered 2021-02-15 – 2021-02-19 (×23): 2 [IU] via SUBCUTANEOUS

## 2021-02-15 NOTE — Progress Notes (Signed)
1524 patient had run of Vtach. Pulse present. Patient awake, BP remained stable. Dr. Roda Shutters and Dr. Isaiah Serge notified. Orders for labs placed. Patient converted back to sinus rhythm on her own.

## 2021-02-15 NOTE — Progress Notes (Addendum)
STROKE TEAM PROGRESS NOTE   INTERVAL HISTORY Son and daughter at bedside.  Patient still intubated, not tolerating weaning trial today, not candidate for intubation today.  BP still on the low end, off Levophed, neuro stable.  Still on heparin IV and Brilinta.  Vitals:   02/15/21 1100 02/15/21 1120 02/15/21 1200 02/15/21 1300  BP: 100/67  (!) 93/58 98/60  Pulse: 90  92 90  Resp: (!) 27  (!) 24 19  Temp:   99.6 F (37.6 C)   TempSrc:   Axillary   SpO2: 100% 98% 99% 94%  Weight:      Height:       CBC:  Recent Labs  Lab 02/13/2021 1823 02/11/2021 1831 02/14/21 0105 02/15/21 0342  WBC 7.1   < > 11.9* 15.3*  NEUTROABS 4.8  --   --   --   HGB 15.9*   < > 14.4 14.2  HCT 49.7*   < > 45.5 43.9  MCV 89.9   < > 88.7 90.3  PLT 260   < > 252 287   < > = values in this interval not displayed.   Basic Metabolic Panel:  Recent Labs  Lab 02/13/21 0533 02/13/21 1226 02/14/21 0105 02/14/21 1459  NA 136  --  134*  --   K 3.6  --  4.1  --   CL 108  --  105  --   CO2 21*  --  20*  --   GLUCOSE 96  --  97  --   BUN 9  --  8  --   CREATININE 0.39*  --  0.53  --   CALCIUM 7.8*  --  8.0*  --   MG 1.5*   < > 2.0 2.1  PHOS 3.2   < > 3.9 4.1   < > = values in this interval not displayed.   Lipid Panel:  Recent Labs  Lab 02/13/21 0533  CHOL 240*  TRIG 85   86  HDL 72  CHOLHDL 3.3  VLDL 17  LDLCALC 151*   HgbA1c:  Recent Labs  Lab 02/13/21 0533  HGBA1C 11.9*   Urine Drug Screen: No results for input(s): LABOPIA, COCAINSCRNUR, LABBENZ, AMPHETMU, THCU, LABBARB in the last 168 hours.  Alcohol Level  Recent Labs  Lab 02/08/2021 2341  ETH <10    IMAGING past 24 hours DG CHEST PORT 1 VIEW  Result Date: 02/15/2021 CLINICAL DATA:  Acute respiratory failure. EXAM: PORTABLE CHEST 1 VIEW COMPARISON:  October 21, 2020. FINDINGS: Stable cardiomegaly. Endotracheal and feeding tubes appear to be in good position. Mild bibasilar edema or atelectasis is noted. Bony thorax is unremarkable.  IMPRESSION: Endotracheal and feeding tubes in grossly good position. Mild bibasilar edema or atelectasis is noted. Electronically Signed   By: Marijo Conception M.D.   On: 02/15/2021 10:44   DG Abd Portable 1V  Result Date: 02/15/2021 CLINICAL DATA:  Feeding tube placement. EXAM: PORTABLE ABDOMEN - 1 VIEW COMPARISON:  February 13, 2021. FINDINGS: The bowel gas pattern is normal. Distal tip of feeding tube is seen in expected position of distal stomach. No radio-opaque calculi or other significant radiographic abnormality are seen. IMPRESSION: Distal tip of feeding tube seen in expected position of distal stomach. Electronically Signed   By: Marijo Conception M.D.   On: 02/15/2021 10:44    PHYSICAL EXAM  Temp:  [98.7 F (37.1 C)-100.6 F (38.1 C)] 99.6 F (37.6 C) (12/30 1200) Pulse Rate:  [87-113] 90 (12/30  1300) Resp:  [15-31] 19 (12/30 1300) BP: (93-124)/(56-103) 98/60 (12/30 1300) SpO2:  [89 %-100 %] 94 % (12/30 1300) FiO2 (%):  [30 %-40 %] 40 % (12/30 1200) Weight:  [68.2 kg] 68.2 kg (12/30 0500)  General - Well nourished, well developed, intubated off sedation.  Ophthalmologic - fundi not visualized due to noncooperation.  Cardiovascular - Regular rate and rhythm.  Neuro - intubated off sedation, eyes open, following commands. With forced eye opening, eyes in mid position, blinking to visual threat, bilateral gaze complete, PERRL. Corneal reflex present, gag and cough present. Breathing over the vent.  Facial symmetry not able to test due to ET tube.  Tongue protrusion not cooperative. On pain stimulation, spontaneous movement on the right against gravity, right upper extremity at least 4/5, right lower extremity 3/5.  Left upper extremity 2 -/5 proximal and 2/5 finger movement.  Left lower extremity 2/5 proximal and 0/5 distal.  Sensation subjectively symmetrical, coordination right finger-to-nose slow but grossly intact and gait not tested.    ASSESSMENT/PLAN Taylor [name] is a  55 y.o. female with history of CHF, T2DM and OSA presenting with acute onset left-sided weakness, right gaze deviation and slurred speech. She was found to have occlusion of the distal basilar artery, right P1 and proximal right P2.  She was given TNK and received mechanical thrombectomy to the right and left PCA with stenting on the left.  She remains intubated and has not yet been able to wean from the ventilator.  She was found to have two apical thrombi on her 2D echocardiogram today.  Stroke:  embolic shower with b/l PCA occlusion s/p IR with TICI3 and left PCA stenting, likely secondary due to embolism from low EF and apical thrombi Code Stroke CT head No acute abnormality.  ASPECTS 10.    CTA head & neck nonopacification of distal basilar artery, right P1 and P2, poor opacification of right vertebral artery, focal stenosis in right A1 and A3 IR with b/l P1 occlusion s/p thrombectomy with TICI3 and left PCA stent MRI  Acute right PCA territory infarct, in right thalamus and occipital lobe, small acute infarcts in bilateral cerebellar hemispheres with scattered small acute infarcts in bilateral parietal lobes, left occipital and left frontal lobes, area of susceptibility infarct in right occipital lobe suggestive of petechial hemorrhage MRA thrombectomy and placement of left P1/P2 stent with good flow related signal in distal left PCA, poor flow signal in distal basilar tip, poor opacification of right vertebral artery, right A1 and A3 ACA stenosis 2D Echo global hypokinesis with inferior akinesis, EF 20-25%, grade 3 diastolic dysfunction, thrombi in inferoapical and anteroapical areas, no atrial level shunt LDL 151 HgbA1c 11.9 VTE prophylaxis -Heparin IV aspirin 81 mg daily prior to admission, now on aspirin 81 mg daily and Brilinta (ticagrelor) 90 mg bid.  Now on heparin IV and Brilinta.  Aspirin discontinued. 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% Two apical thrombi present Now on heparin IV and Brilinta  Respiratory failure Currently intubated and ventilated Management per CCM Not candidate to extubate today  Hx of hypertension Hypotension Home meds:  lisinopril 2.5 mg daily Stable on the low end SBP 90-100s Off Levophed On midodrine 10 mg every 8h Long-term BP goal normotensive  Fever Leukocytosis UTI WBC 10.8-> 11.9->15.3 T-max 100.6 UA WBC > 50 Urine culture pending On cefepime Management per CCM  Hyperlipidemia Home meds:  atorvastatin 40 mg daily, resumed  in hospital LDL 151, goal < 70 Increase to 80 mg atorvastatin daily  Continue statin at discharge  Diabetes type II Uncontrolled Home meds:  Jardiance 25 mg daily, levemir 17 units BID, metformin 1000 mg BID HgbA1c 11.9, goal < 7.0 CBGs Diabetes coordinator consult SSI  Other Stroke Risk Factors Former cigarette smoker Obstructive sleep apnea  Other Active Problems   Hospital day # 3  This patient is critically ill due to embolic shower, LV thrombus, bilateral PCA occlusion status post thrombectomy and stenting, cardiomyopathy with low EF, hypotension, fever and leukocytosis and at significant risk of neurological worsening, death form recurrent stroke, hemorrhagic transformation, heart failure, aspiration, sepsis. This patient's care requires constant monitoring of vital signs, hemodynamics, respiratory and cardiac monitoring, review of multiple databases, neurological assessment, discussion with family, other specialists and medical decision making of high complexity. I spent 35 minutes of neurocritical care time in the care of this patient. I had long discussion with daughter and son at bedside, updated pt current condition, treatment plan and potential prognosis, and answered all the questions.  They expressed understanding and appreciation.  I also discussed with Dr. Vaughan Browner CCM  Rosalin Hawking, MD  PhD Stroke Neurology 02/15/2021 1:42 PM      To contact Stroke Continuity provider, please refer to [email or web address] After hours, contact General Neurology

## 2021-02-15 NOTE — Progress Notes (Addendum)
NAME:  Taylor [name], MRN:  [id number], DOB:  [birth year], LOS: 3 ADMISSION DATE:  01/25/2021, CONSULTATION DATE:  12/27 REFERRING MD:  Dr Tennis Must Sindy Messing , CHIEF COMPLAINT:   CVA  History of Present Illness:  Patient is encephalopathic and/or intubated. Therefore history has been obtained from chart review. 55 year old female with PMH as below, which is significant for HFrEF with LVEF 20-25 % (2017), DM, and OSA on CPAP. 12/27 she presented to [facility] ED with complaints of acute onset left sided weakness and slurred speech. EMS noted non-verbal and right gaze preference. LKW 1700 at which time she fell walking to the restroom. She presented to [facility] as a code stroke. Imaging consistent with occlusion of the distal basilar arterty, right P1, and proximal R P2. She was given systemic TNK and was taken to IR for angiography and possible thrombectomy. She underwent mechanical thrombectomy performed with stent retriever on the right with complete recanalization after one pass (TICI3). Mechanical thrombectomy performed on the left with stent retriever with multiple passes performed due to recurrent occlusion. Complete recanalization of the left PCA achieved. Post op she was sent to the ICU on vent for recovery. PCCM consulted for vent management and ICU care.    Pertinent  Medical History   has a past medical history of CHF (congestive heart failure) ([place]), Diabetes mellitus without complication ([place]), and Obstructive sleep apnea.   Significant Hospital Events: Including procedures, antibiotic start and stop dates in addition to other pertinent events   12/27 admit for posterior circulation stroke. TNK given and IR throbmectomy 12/28 echo with LV thrombus, EF 20-25%.  Started on heparin  Interim History / Subjective:   On heparin gtt. PSV weans  Objective   Blood pressure 118/79, pulse 97, temperature (!) 100.6 F (38.1 C), temperature source Axillary, resp. rate 15, height 5\' 1"  (1.549 m),  weight 68.2 kg, SpO2 99 %.    Vent Mode: PSV;CPAP FiO2 (%):  [30 %-40 %] 40 % Set Rate:  [18 bmp] 18 bmp Vt Set:  [380 mL] 380 mL PEEP:  [5 cmH20] 5 cmH20 Pressure Support:  [15 cmH20-16 cmH20] 15 cmH20 Plateau Pressure:  [16 cmH20-18 cmH20] 16 cmH20   Intake/Output Summary (Last 24 hours) at 02/15/2021 [id number] Last data filed at 02/15/2021 0600 Gross per 24 hour  Intake 1653.24 ml  Output 800 ml  Net 853.24 ml   Filed Weights   02/02/2021 2007 02/14/21 0500 02/15/21 0500  Weight: 63.4 kg 63.4 kg 68.2 kg    Examination: Gen:      No acute distress HEENT:  EOMI, sclera anicteric Neck:     No masses; no thyromegaly, ETT Lungs:    Clear to auscultation bilaterally; normal respiratory effort CV:         Regular rate and rhythm; no murmurs Abd:      + bowel sounds; soft, non-tender; no palpable masses, no distension Ext:    No edema; adequate peripheral perfusion Skin:      Warm and dry; no rash Neuro: Sedated  Labs/imaging reviewed WBC count increased to 15.3 No new imaging  Resolved Hospital Problem list     Assessment & Plan:  CVA: basilar, bilateral PI/PCA.  TNK given in ED and she is s/p mechanical thrombectomy on the right and mechanical thrombectomy with stent placement on the L.  Management per neurology  Acute resp failure, inability to protect airway due to stroke OSA on CPAP Continue vent support SBT's as tolerated Follow intermittent  chest x-ray Will need nocturnal CPAP after extubation Get chest x-ray and tracheal aspirate given increasing WBC count and low-grade fever  Chronic HFrEF: LVEF 20-25% LV thrombus Continue heparin drip On low-dose nor epi to keep a blood pressure drop Started midodrine  DM SSI coverage Add tube feeding coverage Holding home glimepiride, levemir, metformin  Best Practice (right click and "Reselect all SmartList Selections" daily)   Diet/type: tubefeeds DVT prophylaxis: systemic heparin GI prophylaxis: PPI Lines:  Arterial Line Foley:  N/A Code Status:  full code Last date of multidisciplinary goals of care discussion [ ]   Critical care time:    The patient is critically ill with multiple organ system failure and requires high complexity decision making for assessment and support, frequent evaluation and titration of therapies, advanced monitoring, review of radiographic studies and interpretation of complex data.   Critical Care Time devoted to patient care services, exclusive of separately billable procedures, described in this note is 35 minutes.   MD [facility] See Amion for pager  If no response to pager , please call [phone] until 7pm After 7:00 pm call Elink  [phone] 02/15/2021, 9:25 AM      ,

## 2021-02-15 NOTE — Progress Notes (Signed)
ANTICOAGULATION CONSULT NOTE  Pharmacy Consult for IV Heparin Indication:  LV thrombus, CVA  No Known Allergies  Patient Measurements: Height: 5\' 1"  (154.9 cm) Weight: 68.2 kg (150 lb 5.7 oz) IBW/kg (Calculated) : 47.8 Heparin Dosing Weight: 60.8 kg  Vital Signs: Temp: 100.6 F (38.1 C) (12/30 0400) Temp Source: Axillary (12/30 0400) BP: 113/78 (12/30 0645) Pulse Rate: 113 (12/30 0645)  Labs: Recent Labs    01/28/2021 1823 01/23/2021 1831 02/14/2021 2341 02/13/21 0259 02/13/21 0533 02/13/21 0533 02/14/21 0105 02/14/21 1000 02/14/21 1815 02/15/21 0342  HGB 15.9*   < >  --    < > 13.8  --  14.4  --   --  14.2  HCT 49.7*   < >  --    < > 43.1  --  45.5  --   --  43.9  PLT 260  --   --   --  229  --  252  --   --  287  APTT 25  --   --   --   --   --   --   --   --   --   LABPROT 12.3  --   --   --   --   --   --   --   --   --   INR 0.9  --   --   --   --   --   --   --   --   --   HEPARINUNFRC  --   --   --   --   --    < > <0.10* <0.10* 0.28* 0.40  CREATININE  --    < > 0.48  --  0.39*  --  0.53  --   --   --    < > = values in this interval not displayed.     Estimated Creatinine Clearance: 69.4 mL/min (by C-G formula based on SCr of 0.53 mg/dL).  Assessment: 55 years of age female with CVA and LV thrombus.  Patient is s/p TNK on 12/27.  Pharmacy consulted to dose IV heparin per stroke protocol.  Heparin level therapeutic; no bleeding reported.  Goal of Therapy:  Heparin level 0.3 to 0.5 units/ml Monitor platelets by anticoagulation protocol: Yes   Plan:  Continue heparin infusion at 1200 units/hr Daily heparin level and CBC   Chereese Cilento D. 1/28, PharmD, BCPS, BCCCP 02/15/2021, 7:24 AM

## 2021-02-15 NOTE — Progress Notes (Signed)
Inpatient Diabetes Program Recommendations  AACE/ADA: New Consensus Statement on Inpatient Glycemic Control (2015)  Target Ranges:  Prepandial:   less than 140 mg/dL      Peak postprandial:   less than 180 mg/dL (1-2 hours)      Critically ill patients:  140 - 180 mg/dL   Lab Results  Component Value Date   GLUCAP 239 (H) 02/15/2021   HGBA1C 11.9 (H) 02/13/2021    Review of Glycemic Control  Latest Reference Range & Units 02/14/21 19:21 02/14/21 23:11 02/15/21 03:18 02/15/21 08:36  Glucose-Capillary 70 - 99 mg/dL 185 (H) 631 (H) 497 (H) 239 (H)  (H): Data is abnormally high  Diabetes history: DM2 Outpatient Diabetes medications: Levemir 17 units bid, Jardiance 25, Amaryl 4 mg bid, Metformin 1 gm bid Current orders for Inpatient glycemic control: Novolog correction 0-15 units q 4 hrs. Glucerna @ 45 ml/hr   Inpatient Diabetes Program Recommendations:   With tube feeds, consider adding Novolog 2 units Q4H for tube feed coverage (to be stopped or held in the event tube feeds stopped).   Thanks, Lujean Rave, MSN, RNC-OB Diabetes Coordinator [phone] (8a-5p)

## 2021-02-15 NOTE — Procedures (Signed)
Cortrak ° °Person Inserting Tube:  Akiah Bauch L, RD °Tube Type:  Cortrak - 43 inches °Tube Size:  10 °Tube Location:  Left nare °Initial Placement:  Stomach °Secured by: Bridle °Technique Used to Measure Tube Placement:  Marking at nare/corner of mouth °Cortrak Secured At:  65 cm ° °Cortrak Tube Team Note: ° °Consult received to place a Cortrak feeding tube.  ° °X-ray is required, abdominal x-ray has been ordered by the Cortrak team. Please confirm tube placement before using the Cortrak tube.  ° °If the tube becomes dislodged please keep the tube and contact the Cortrak team at [email or web address] (password [email or web address]) for replacement.  °If after hours and replacement cannot be delayed, place a NG tube and confirm placement with an abdominal x-ray.  ° ° °Jeraldine Primeau BS, RD, LDN °Clinical Dietitian °See AMiON for contact information.  ° ° °

## 2021-02-16 DIAGNOSIS — I639 Cerebral infarction, unspecified: Secondary | ICD-10-CM | POA: Diagnosis not present

## 2021-02-16 LAB — GLUCOSE, CAPILLARY
Glucose-Capillary: 108 mg/dL — ABNORMAL HIGH (ref 70–99)
Glucose-Capillary: 121 mg/dL — ABNORMAL HIGH (ref 70–99)
Glucose-Capillary: 122 mg/dL — ABNORMAL HIGH (ref 70–99)
Glucose-Capillary: 133 mg/dL — ABNORMAL HIGH (ref 70–99)
Glucose-Capillary: 142 mg/dL — ABNORMAL HIGH (ref 70–99)
Glucose-Capillary: 142 mg/dL — ABNORMAL HIGH (ref 70–99)

## 2021-02-16 LAB — CBC
HCT: 40.9 % (ref 36.0–46.0)
Hemoglobin: 13 g/dL (ref 12.0–15.0)
MCH: 29.1 pg (ref 26.0–34.0)
MCHC: 31.8 g/dL (ref 30.0–36.0)
MCV: 91.5 fL (ref 80.0–100.0)
Platelets: 216 10*3/uL (ref 150–400)
RBC: 4.47 MIL/uL (ref 3.87–5.11)
RDW: 16.7 % — ABNORMAL HIGH (ref 11.5–15.5)
WBC: 13.9 10*3/uL — ABNORMAL HIGH (ref 4.0–10.5)
nRBC: 0 % (ref 0.0–0.2)

## 2021-02-16 LAB — BASIC METABOLIC PANEL
Anion gap: 9 (ref 5–15)
BUN: 13 mg/dL (ref 6–20)
CO2: 25 mmol/L (ref 22–32)
Calcium: 8 mg/dL — ABNORMAL LOW (ref 8.9–10.3)
Chloride: 105 mmol/L (ref 98–111)
Creatinine, Ser: 0.55 mg/dL (ref 0.44–1.00)
GFR, Estimated: >60 mL/min (ref >60)
Glucose, Bld: 110 mg/dL — ABNORMAL HIGH (ref 70–99)
Potassium: 3.9 mmol/L (ref 3.5–5.1)
Sodium: 139 mmol/L (ref 135–145)

## 2021-02-16 LAB — HEPARIN LEVEL (UNFRACTIONATED): Heparin Unfractionated: 0.53 IU/mL (ref 0.30–0.70)

## 2021-02-16 MED ORDER — FREE WATER
250.0000 mL | Freq: Two times a day (BID) | Status: DC
  Administered 2021-02-16 – 2021-02-17 (×3): 250 mL

## 2021-02-16 NOTE — Progress Notes (Addendum)
NAME:  Taylor [name], MRN:  [id number], DOB:  1965-10-06, LOS: 4 ADMISSION DATE:  02/17/2021, CONSULTATION DATE:  12/27 REFERRING MD:  Dr Tommi Rumps Melchor Amour , CHIEF COMPLAINT:   CVA  History of Present Illness:  55 year old female with PMH as below, which is significant for HFrEF with LVEF 20-25 % (2017), DM, and OSA on CPAP. 12/27 she presented to [facility] ED with complaints of acute onset left sided weakness and slurred speech. EMS noted non-verbal and right gaze preference. Imaging consistent with occlusion of the distal basilar arterty, right P1, and proximal R P2. She was given systemic TNK and mechanical thrombectomy  with stent retriever on the right with complete recanalization after one pass (TICI3). PCCM consulted for vent management and ICU care.    Pertinent  Medical History   has a past medical history of CHF (congestive heart failure) ([facility]), Diabetes mellitus without complication ([facility]), and Obstructive sleep apnea.   Significant Hospital Events: Including procedures, antibiotic start and stop dates in addition to other pertinent events   12/27 admit for posterior circulation stroke. TNK given and IR throbmectomy 12/28 echo with LV thrombus, EF 20-25%.  Started on heparin 12/39 failing weaning trials.  Started on cefepime for positive UA  Interim History / Subjective:    She had a run of V. tach yesterday.  Remains on the ventilator  Objective   Blood pressure 98/62, pulse 83, temperature 98 F (36.7 C), temperature source Oral, resp. rate (!) 21, height 5\' 1"  (1.549 m), weight 68.3 kg, SpO2 100 %.    Vent Mode: PRVC FiO2 (%):  [30 %-40 %] 30 % Set Rate:  [18 bmp] 18 bmp Vt Set:  [380 mL] 380 mL PEEP:  [5 cmH20] 5 cmH20 Pressure Support:  [15 cmH20] 15 cmH20 Plateau Pressure:  [16 cmH20-20 cmH20] 17 cmH20   Intake/Output Summary (Last 24 hours) at 02/16/2021 0734 Last data filed at 02/16/2021 0700 Gross per 24 hour  Intake 2041.41 ml  Output 630 ml  Net  1411.41 ml   Filed Weights   02/14/21 0500 02/15/21 0500 02/16/21 0500  Weight: 63.4 kg 68.2 kg 68.3 kg    Examination: Gen:      No acute distress HEENT:  EOMI, sclera anicteric, ETT Neck:     No masses; no thyromegaly Lungs:    Clear to auscultation bilaterally; normal respiratory effort CV:         Regular rate and rhythm; no murmurs Abd:      + bowel sounds; soft, non-tender; no palpable masses, no distension Ext:    No edema; adequate peripheral perfusion Skin:      Warm and dry; no rash Neuro: Sedated  Labs/imaging reviewed Metabolic panel is stable.  WBC count elevated at 13.9 No new imaging  Resolved Hospital Problem list     Assessment & Plan:  CVA: basilar, bilateral PI/PCA.  TNK given in ED and she is s/p mechanical thrombectomy on the right and mechanical thrombectomy with stent placement on the L.  Management per neurology  Acute resp failure, inability to protect airway due to stroke OSA on CPAP Continue vent support SBT's as tolerated Follow intermittent chest x-ray Will need nocturnal CPAP after extubation   Chronic HFrEF: LVEF 20-25% LV thrombus Continue heparin drip She is off pressors.  Continue midodrine  DM SSI coverage Tube feeding coverage Holding home glimepiride, levemir, metformin  Best Practice (right click and "Reselect all SmartList Selections" daily)   Diet/type: tubefeeds DVT prophylaxis:  systemic heparin GI prophylaxis: PPI Lines: N/A Foley:  Yes, and it is still needed Code Status:  full code Last date of multidisciplinary goals of care discussion [ ]   Critical care time:    The patient is critically ill with multiple organ system failure and requires high complexity decision making for assessment and support, frequent evaluation and titration of therapies, advanced monitoring, review of radiographic studies and interpretation of complex data.   Critical Care Time devoted to patient care services, exclusive of separately  billable procedures, described in this note is 45 minutes.   Marshell Garfinkel MD [facility] See Amion for pager  If no response to pager , please call [phone] until 7pm After 7:00 pm call Elink  [phone] 02/16/2021, 7:39 AM      ,

## 2021-02-16 NOTE — Progress Notes (Addendum)
STROKE TEAM PROGRESS NOTE   daughter at bedside.  Patient still intubated, weaning trial today. BP still on the low end but stable. Per nurse only 250cc output overnight. No free water with tube feeds.  Will add 250cc BID. Still on heparin IV and Brilinta.  Vitals:   02/16/21 0800 02/16/21 0900 02/16/21 1000 02/16/21 1100  BP: 100/67 103/67 105/72 103/69  Pulse: 86 86 (!) 218 97  Resp: (!) 29 (!) 27 (!) 28 (!) 27  Temp: 97.9 F (36.6 C)     TempSrc: Oral     SpO2: 100% 100% 96% 100%  Weight:      Height:       CBC:  Recent Labs  Lab 01/28/2021 1823 01/28/2021 1831 02/15/21 1636 02/16/21 0341  WBC 7.1   < > 12.7* 13.9*  NEUTROABS 4.8  --   --   --   HGB 15.9*   < > 13.4 13.0  HCT 49.7*   < > 41.7 40.9  MCV 89.9   < > 89.9 91.5  PLT 260   < > 236 216   < > = values in this interval not displayed.   Basic Metabolic Panel:  Recent Labs  Lab 02/14/21 0105 02/14/21 1459 02/15/21 1636 02/16/21 0341  NA 134*  --  139 139  K 4.1  --  4.4 3.9  CL 105  --  107 105  CO2 20*  --  25 25  GLUCOSE 97  --  179* 110*  BUN 8  --  12 13  CREATININE 0.53  --  0.57 0.55  CALCIUM 8.0*  --  7.9* 8.0*  MG 2.0 2.1  --   --   PHOS 3.9 4.1  --   --    Lipid Panel:  Recent Labs  Lab 02/13/21 0533  CHOL 240*  TRIG 85   86  HDL 72  CHOLHDL 3.3  VLDL 17  LDLCALC 151*   HgbA1c:  Recent Labs  Lab 02/13/21 0533  HGBA1C 11.9*   Urine Drug Screen: No results for input(s): LABOPIA, COCAINSCRNUR, LABBENZ, AMPHETMU, THCU, LABBARB in the last 168 hours.  Alcohol Level  Recent Labs  Lab 02/16/2021 2341  ETH <10    IMAGING past 24 hours No results found.  PHYSICAL EXAM  Temp:  [97.7 F (36.5 C)-99.9 F (37.7 C)] 97.9 F (36.6 C) (12/31 0800) Pulse Rate:  [74-218] 97 (12/31 1100) Resp:  [18-31] 27 (12/31 1100) BP: (90-107)/(54-72) 103/69 (12/31 1100) SpO2:  [93 %-100 %] 100 % (12/31 1100) FiO2 (%):  [30 %] 30 % (12/31 1100) Weight:  [68.3 kg] 68.3 kg (12/31 0500)  General -  Well nourished, well developed, intubated off sedation.  Ophthalmologic - fundi not visualized due to noncooperation.  Cardiovascular - Regular rate and rhythm.  Neuro - intubated off sedation, eyes open, following commands. eyes in mid position, blinking to visual threat, bilateral gaze complete, PERRL. Corneal reflex present, gag and cough present. Breathing over the vent.  Facial symmetry not able to test due to ET tube.  Tongue protrusion unable to test due to ET tube. right against gravity to command, right upper extremity at least 4/5, right lower extremity 3/5.  Left upper extremity 2 -/5 proximal and 2/5 finger movement.  Left lower extremity 2/5 proximal and 0/5 distal.   coordination right finger-to-nose slow but grossly intact. Gait: gait not tested.    ASSESSMENT/PLAN Taylor [name] is a 55 y.o. female with history of CHF, T2DM and  OSA presenting with acute onset left-sided weakness, right gaze deviation and slurred speech. She was found to have occlusion of the distal basilar artery, right P1 and proximal right P2.  She was given TNK and received mechanical thrombectomy to the right and left PCA with stenting on the left.  She remains intubated and has not yet been able to wean from the ventilator.  She was found to have two apical thrombi on her 2D echocardiogram today.  Stroke:  embolic shower with b/l PCA occlusion s/p IR with TICI3 and left PCA stenting, likely secondary due to embolism from low EF and apical thrombi Code Stroke CT head No acute abnormality.  ASPECTS 10.    CTA head & neck nonopacification of distal basilar artery, right P1 and P2, poor opacification of right vertebral artery, focal stenosis in right A1 and A3 IR with b/l P1 occlusion s/p thrombectomy with TICI3 and left PCA stent MRI  Acute right PCA territory infarct, in right thalamus and occipital lobe, small acute infarcts in bilateral cerebellar hemispheres with scattered small acute infarcts in bilateral  parietal lobes, left occipital and left frontal lobes, area of susceptibility infarct in right occipital lobe suggestive of petechial hemorrhage MRA thrombectomy and placement of left P1/P2 stent with good flow related signal in distal left PCA, poor flow signal in distal basilar tip, poor opacification of right vertebral artery, right A1 and A3 ACA stenosis 2D Echo global hypokinesis with inferior akinesis, EF 20-25%, grade 3 diastolic dysfunction, thrombi in inferoapical and anteroapical areas, no atrial level shunt LDL 151 HgbA1c 11.9 VTE prophylaxis -Heparin IV aspirin 81 mg daily prior to admission, Now on heparin IV and Brilinta.  Transition to PO AC when extubated. 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 Sensitivity pending On cefepime Management per CCM  Hyperlipidemia Home meds:  atorvastatin 40 mg daily, resumed in hospital LDL 151, goal < 70 Increase to 80 mg atorvastatin daily  Continue statin at discharge  Diabetes type II Uncontrolled Home meds:  Jardiance 25 mg daily, levemir 17 units BID, metformin 1000 mg BID HgbA1c 11.9, goal < 7.0 CBGs Diabetes coordinator consult SSI  Other Stroke Risk Factors Former cigarette smoker Obstructive sleep apnea  Other Active Problems   Hospital day # 4  250cc free water BID added today to help with hypotension and urinary oupt. VSS. Con't to monitor.  Transition to PO AC when extubated.  Daughter  needs note for work, ICU nurse to help get her one.  This patient is critically ill due to embolic shower, LV thrombus, bilateral PCA occlusion status post thrombectomy and stenting, cardiomyopathy with low EF, hypotension,UTI and at significant risk of neurological worsening, death form recurrent stroke, hemorrhagic transformation, heart failure, aspiration, sepsis. This patient's care requires constant monitoring of vital signs, hemodynamics, respiratory and cardiac monitoring, review of multiple databases, neurological assessment, discussion with family, other specialists and medical decision making of high complexity. I spent 40 minutes of neurocritical care time in the care of this patient. discussed with  Dr. Vaughan Browner CCM    To contact Stroke Continuity provider, please refer to [email or web address] After hours, contact General Neurology

## 2021-02-16 NOTE — Progress Notes (Signed)
ANTICOAGULATION CONSULT NOTE - Follow Up Consult  Pharmacy Consult for Heparin Indication:  LV thrombus, s/p CVA   No Known Allergies  Patient Measurements: Height: 5\' 1"  (154.9 cm) Weight: 68.3 kg (150 lb 9.2 oz) IBW/kg (Calculated) : 47.8 Heparin Dosing Weight: 63.4 kg  Vital Signs: Temp: 98 F (36.7 C) (12/31 0400) Temp Source: Oral (12/31 0400) BP: 100/67 (12/31 0800) Pulse Rate: 86 (12/31 0800)  Labs: Recent Labs    02/14/21 0105 02/14/21 1000 02/14/21 1815 02/15/21 0342 02/15/21 1636 02/16/21 0341  HGB 14.4  --   --  14.2 13.4 13.0  HCT 45.5  --   --  43.9 41.7 40.9  PLT 252  --   --  287 236 216  HEPARINUNFRC <0.10*   < > 0.28* 0.40  --  0.53  CREATININE 0.53  --   --   --  0.57 0.55   < > = values in this interval not displayed.    Estimated Creatinine Clearance: 69.4 mL/min (by C-G formula based on SCr of 0.55 mg/dL).   Assessment: Anticoag: Heparin for new LV thrombus in setting of acute CVA, HL 0.53. CBC remains WNL  Goal of Therapy:  Heparin level 0.3-0.5 units/ml Monitor platelets by anticoagulation protocol: Yes   Plan:  Decrease heparin to 1150 units/hr Daily heparin level and CBC F/u cultures to narrow abx   Keonte Daubenspeck S. 02/18/21, PharmD, BCPS Clinical Staff Pharmacist [email or web address] Merilynn Finland, Merilynn Finland 02/16/2021,8:57 AM

## 2021-02-17 DIAGNOSIS — I639 Cerebral infarction, unspecified: Secondary | ICD-10-CM | POA: Diagnosis not present

## 2021-02-17 LAB — BASIC METABOLIC PANEL
Anion gap: 10 (ref 5–15)
BUN: 14 mg/dL (ref 6–20)
CO2: 23 mmol/L (ref 22–32)
Calcium: 8 mg/dL — ABNORMAL LOW (ref 8.9–10.3)
Chloride: 106 mmol/L (ref 98–111)
Creatinine, Ser: 0.52 mg/dL (ref 0.44–1.00)
GFR, Estimated: >60 mL/min (ref >60)
Glucose, Bld: 175 mg/dL — ABNORMAL HIGH (ref 70–99)
Potassium: 4.6 mmol/L (ref 3.5–5.1)
Sodium: 139 mmol/L (ref 135–145)

## 2021-02-17 LAB — URINE CULTURE: Culture: >=100000 — AB

## 2021-02-17 LAB — CBC
HCT: 43.1 % (ref 36.0–46.0)
Hemoglobin: 13.3 g/dL (ref 12.0–15.0)
MCH: 28.7 pg (ref 26.0–34.0)
MCHC: 30.9 g/dL (ref 30.0–36.0)
MCV: 93.1 fL (ref 80.0–100.0)
Platelets: 239 10*3/uL (ref 150–400)
RBC: 4.63 MIL/uL (ref 3.87–5.11)
RDW: 16.8 % — ABNORMAL HIGH (ref 11.5–15.5)
WBC: 12.3 10*3/uL — ABNORMAL HIGH (ref 4.0–10.5)
nRBC: 0 % (ref 0.0–0.2)

## 2021-02-17 LAB — GLUCOSE, CAPILLARY
Glucose-Capillary: 111 mg/dL — ABNORMAL HIGH (ref 70–99)
Glucose-Capillary: 116 mg/dL — ABNORMAL HIGH (ref 70–99)
Glucose-Capillary: 152 mg/dL — ABNORMAL HIGH (ref 70–99)
Glucose-Capillary: 163 mg/dL — ABNORMAL HIGH (ref 70–99)
Glucose-Capillary: 167 mg/dL — ABNORMAL HIGH (ref 70–99)
Glucose-Capillary: 169 mg/dL — ABNORMAL HIGH (ref 70–99)

## 2021-02-17 LAB — HEPARIN LEVEL (UNFRACTIONATED): Heparin Unfractionated: 0.47 IU/mL (ref 0.30–0.70)

## 2021-02-17 MED ORDER — FREE WATER
250.0000 mL | Freq: Four times a day (QID) | Status: DC
  Administered 2021-02-17 – 2021-02-19 (×6): 250 mL

## 2021-02-17 MED ORDER — LACTATED RINGERS IV BOLUS
500.0000 mL | Freq: Once | INTRAVENOUS | Status: AC
  Administered 2021-02-17: 500 mL via INTRAVENOUS

## 2021-02-17 MED ORDER — SODIUM CHLORIDE 0.9 % IV SOLN
1.0000 g | INTRAVENOUS | Status: DC
  Administered 2021-02-17 – 2021-02-18 (×2): 1 g via INTRAVENOUS
  Filled 2021-02-17 (×2): qty 10

## 2021-02-17 NOTE — Progress Notes (Signed)
ANTICOAGULATION CONSULT NOTE - Follow Up Consult  Pharmacy Consult for Heparin Indication:  LV thrombus, s/p CVA   No Known Allergies  Patient Measurements: Height: 5\' 1"  (154.9 cm) Weight: 68.3 kg (150 lb 9.2 oz) IBW/kg (Calculated) : 47.8 Heparin Dosing Weight: 63.4 kg  Vital Signs: Temp: 97.7 F (36.5 C) (01/01 0800) Temp Source: Axillary (01/01 0800) BP: 119/84 (01/01 0800) Pulse Rate: 96 (01/01 0800)  Labs: Recent Labs    02/15/21 0342 02/15/21 1636 02/16/21 0341 02/17/21 0244  HGB 14.2 13.4 13.0 13.3  HCT 43.9 41.7 40.9 43.1  PLT 287 236 216 239  HEPARINUNFRC 0.40  --  0.53 0.47  CREATININE  --  0.57 0.55 0.52     Estimated Creatinine Clearance: 69.4 mL/min (by C-G formula based on SCr of 0.52 mg/dL).   Assessment: Anticoag: Heparin for new LV thrombus in setting of acute CVA, HL 0.47. CBC remains WNL  Goal of Therapy:  Heparin level 0.3-0.5 units/ml Monitor platelets by anticoagulation protocol: Yes   Plan:  Con't heparin to 1150 units/hr Daily heparin level and CBC On Cefepime with pan sensitivie Ecoli UTO but f/u TA culture to narrow as appropriate   Sole Lengacher S. 04/17/21, PharmD, BCPS Clinical Staff Pharmacist [email or web address] Merilynn Finland, Shaneika Rossa Stillinger 02/17/2021,8:24 AM

## 2021-02-17 NOTE — Progress Notes (Signed)
STROKE TEAM PROGRESS NOTE   daughter at bedside, she cancelled her plans to go to [place] to see her kids. Patient still intubated, weaning trial today.   BP improved with addition of free water 224ml BID.  Still on heparin IV and Brilinta.  Vitals:   02/17/21 1055 02/17/21 1100 02/17/21 1200 02/17/21 1300  BP:  112/79 112/82 109/78  Pulse:  92 95 92  Resp:  (!) 26 (!) 26 (!) 28  Temp:   98.7 F (37.1 C)   TempSrc:   Axillary   SpO2: 98% 98% 98% 98%  Weight:      Height:       CBC:  Recent Labs  Lab 02/15/2021 1823 02/10/2021 1831 02/16/21 0341 02/17/21 0244  WBC 7.1   < > 13.9* 12.3*  NEUTROABS 4.8  --   --   --   HGB 15.9*   < > 13.0 13.3  HCT 49.7*   < > 40.9 43.1  MCV 89.9   < > 91.5 93.1  PLT 260   < > 216 239   < > = values in this interval not displayed.   Basic Metabolic Panel:  Recent Labs  Lab 02/14/21 0105 02/14/21 1459 02/15/21 1636 02/16/21 0341 02/17/21 0244  NA 134*  --    < > 139 139  K 4.1  --    < > 3.9 4.6  CL 105  --    < > 105 106  CO2 20*  --    < > 25 23  GLUCOSE 97  --    < > 110* 175*  BUN 8  --    < > 13 14  CREATININE 0.53  --    < > 0.55 0.52  CALCIUM 8.0*  --    < > 8.0* 8.0*  MG 2.0 2.1  --   --   --   PHOS 3.9 4.1  --   --   --    < > = values in this interval not displayed.   Lipid Panel:  Recent Labs  Lab 02/13/21 0533  CHOL 240*  TRIG 85   86  HDL 72  CHOLHDL 3.3  VLDL 17  LDLCALC 151*   HgbA1c:  Recent Labs  Lab 02/13/21 0533  HGBA1C 11.9*   Urine Drug Screen: No results for input(s): LABOPIA, COCAINSCRNUR, LABBENZ, AMPHETMU, THCU, LABBARB in the last 168 hours.  Alcohol Level  Recent Labs  Lab 01/28/2021 2341  ETH <10    IMAGING past 24 hours No results found.  PHYSICAL EXAM  Temp:  [97.6 F (36.4 C)-98.9 F (37.2 C)] 98.7 F (37.1 C) (01/01 1200) Pulse Rate:  [84-101] 92 (01/01 1300) Resp:  [13-33] 28 (01/01 1300) BP: (98-122)/(67-92) 109/78 (01/01 1300) SpO2:  [51 %-100 %] 98 % (01/01  1300) FiO2 (%):  [30 %] 30 % (01/01 1055) Weight:  [68.3 kg] 68.3 kg (01/01 0500)  General - Well nourished, well developed, intubated off sedation.  Ophthalmologic - fundi not visualized due to noncooperation.  Cardiovascular - Regular rate and rhythm.  Neuro - intubated off sedation, eyes open, following commands. eyes in mid position, blinking to visual threat, will look left/right to command. PERRL. Corneal reflex present, gag and cough present. Breathing over the vent.  Facial symmetry not able to test due to ET tube.  Tongue protrusion unable to test due to ET tube. Motor:  will move ext to command. right upper extremity at least 4/5, right lower extremity 3/5.  Left upper extremity 2 -/5 proximal and 2/5 finger movement. Left lower extremity 2/5 proximal and 1/5 distal.   coordination right finger-to-nose slow but grossly intact. Gait: gait not tested.    ASSESSMENT/PLAN Taylor [name] is a 56 y.o. female with history of CHF, T2DM and OSA presenting with acute onset left-sided weakness, right gaze deviation and slurred speech. She was found to have occlusion of the distal basilar artery, right P1 and proximal right P2.  She was given TNK and received mechanical thrombectomy to the right and left PCA with stenting on the left.  She remains intubated and has not yet been able to wean from the ventilator.  She was found to have two apical thrombi on her 2D echocardiogram today.  Stroke:  embolic shower with b/l PCA occlusion s/p IR with TICI3 and left PCA stenting, likely secondary due to embolism from low EF and apical thrombi Code Stroke CT head No acute abnormality.  ASPECTS 10.    CTA head & neck nonopacification of distal basilar artery, right P1 and P2, poor opacification of right vertebral artery, focal stenosis in right A1 and A3 IR with b/l P1 occlusion s/p thrombectomy with TICI3 and left PCA stent MRI  Acute right PCA territory infarct, in right thalamus and occipital lobe,  small acute infarcts in bilateral cerebellar hemispheres with scattered small acute infarcts in bilateral parietal lobes, left occipital and left frontal lobes, area of susceptibility infarct in right occipital lobe suggestive of petechial hemorrhage MRA thrombectomy and placement of left P1/P2 stent with good flow related signal in distal left PCA, poor flow signal in distal basilar tip, poor opacification of right vertebral artery, right A1 and A3 ACA stenosis 2D Echo global hypokinesis with inferior akinesis, EF 0000000, grade 3 diastolic dysfunction, thrombi in inferoapical and anteroapical areas, no atrial level shunt LDL 151 HgbA1c 11.9 VTE prophylaxis -Heparin IV aspirin 81 mg daily prior to admission, Now on heparin IV and Brilinta.  Transition to PO AC when extubated. 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 Sensitivity pending On cefepime Management per CCM  Hyperlipidemia Home meds:  atorvastatin 40 mg daily, resumed in hospital LDL 151, goal < 70 Increase to 80 mg atorvastatin daily  Continue statin at discharge  Diabetes type II Uncontrolled Home meds:  Jardiance 25 mg daily, levemir 17 units BID, metformin 1000 mg BID HgbA1c 11.9, goal < 7.0 CBGs Diabetes coordinator consult SSI  Other Stroke Risk Factors Former cigarette smoker Obstructive sleep apnea  Other Active Problems   Hospital day # 5  Status post IR with bilateral PCA  thrombectomy and left PCA stenting.  Found to have left ventricular thrombus.  Hard to wean on vent getting better.  On IV heparin and Brilinta.  Continue midodrine 3 times daily blood pressure is stable.  Transition to PO AC when extubated.  Daughter is at bedside and updated her on the plan and answered all her questions.  This patient is critically ill due to embolic shower, LV thrombus,  bilateral PCA occlusion status post thrombectomy and stenting, cardiomyopathy with low EF, hypotension,UTI and at significant risk of neurological worsening, death form recurrent stroke, hemorrhagic transformation, heart failure, aspiration, sepsis. This patient's care requires constant monitoring of vital signs, hemodynamics, respiratory and cardiac monitoring, review of multiple databases, neurological assessment, discussion with family, other specialists and medical decision making of high complexity. I spent 35 minutes of neurocritical care time in the care of this patient. discussed with Dr. Vaughan Browner CCM    To contact Stroke Continuity provider, please refer to [email or web address] After hours, contact General Neurology

## 2021-02-17 NOTE — Progress Notes (Signed)
NAME:  Taylor [name], MRN:  [id number], DOB:  1965/10/23, LOS: 5 ADMISSION DATE:  01/20/2021, CONSULTATION DATE:  12/27 REFERRING MD:  Dr Tennis Must Sindy Messing , CHIEF COMPLAINT:   CVA  History of Present Illness:  56 year old female with PMH as below, which is significant for HFrEF with LVEF 20-25 % (2017), DM, and OSA on CPAP. 12/27 she presented to [facility] ED with complaints of acute onset left sided weakness and slurred speech. EMS noted non-verbal and right gaze preference. Imaging consistent with occlusion of the distal basilar arterty, right P1, and proximal R P2. She was given systemic TNK and mechanical thrombectomy  with stent retriever on the right with complete recanalization after one pass (TICI3). PCCM consulted for vent management and ICU care.    Pertinent  Medical History   has a past medical history of CHF (congestive heart failure) ([place]), Diabetes mellitus without complication ([place]), and Obstructive sleep apnea.   Significant Hospital Events: Including procedures, antibiotic start and stop dates in addition to other pertinent events   12/27 admit for posterior circulation stroke. TNK given and IR throbmectomy 12/28 echo with LV thrombus, EF 20-25%.  Started on heparin 12/30 failing weaning trials.  Started on cefepime for positive UA 1/1 On PSV weans  Interim History / Subjective:   Weaned on pressure support yesterday.  Still requiring 10/5   Objective   Blood pressure 119/84, pulse 96, temperature 97.7 F (36.5 C), temperature source Axillary, resp. rate 17, height 5\' 1"  (1.549 m), weight 68.3 kg, SpO2 97 %.    Vent Mode: PSV;CPAP FiO2 (%):  [30 %] 30 % Set Rate:  [18 bmp] 18 bmp Vt Set:  [380 mL] 380 mL PEEP:  [5 cmH20] 5 cmH20 Pressure Support:  [10 cmH20-15 cmH20] 15 cmH20   Intake/Output Summary (Last 24 hours) at 02/17/2021 0835 Last data filed at 02/17/2021 0700 Gross per 24 hour  Intake 1810.05 ml  Output 1100 ml  Net 710.05 ml   Filed Weights    02/15/21 0500 02/16/21 0500 02/17/21 0500  Weight: 68.2 kg 68.3 kg 68.3 kg    Examination: Gen:      No acute distress HEENT:  EOMI, sclera anicteric Neck:     No masses; no thyromegaly, ETT Lungs:    Clear to auscultation bilaterally; normal respiratory effort CV:         Regular rate and rhythm; no murmurs Abd:      + bowel sounds; soft, non-tender; no palpable masses, no distension Ext:    No edema; adequate peripheral perfusion Skin:      Warm and dry; no rash Neuro: Sedated, arousable  Labs/imaging reviewed Metabolic panel is stable WBC count improved to 12.3  Urine culture with E. Coli No new imaging  Resolved Hospital Problem list     Assessment & Plan:  CVA: basilar, bilateral PI/PCA.  TNK given in ED and she is s/p mechanical thrombectomy on the right and mechanical thrombectomy with stent placement on the L.  Management per neurology  Acute resp failure, inability to protect airway due to stroke OSA on CPAP Continue vent support SBT's as tolerated Follow intermittent chest x-ray Will need nocturnal CPAP after extubation  Chronic HFrEF: LVEF 20-25% LV thrombus Continue heparin drip She is off pressors.  Continue midodrine  E. coli UTI Narrow antibiotics to ceftriaxone based on sensitivity  DM SSI coverage Tube feeding coverage Holding home glimepiride, levemir, metformin  Best Practice (right click and "Reselect all SmartList Selections"  daily)   Diet/type: tubefeeds DVT prophylaxis: systemic heparin GI prophylaxis: PPI Lines: N/A Foley:  Yes, and it is still needed Code Status:  full code Last date of multidisciplinary goals of care discussion [ ]   Critical care time:    The patient is critically ill with multiple organ system failure and requires high complexity decision making for assessment and support, frequent evaluation and titration of therapies, advanced monitoring, review of radiographic studies and interpretation of complex data.    Critical Care Time devoted to patient care services, exclusive of separately billable procedures, described in this note is 45 minutes.   Marshell Garfinkel MD [facility] See Amion for pager  If no response to pager , please call [phone] until 7pm After 7:00 pm call Elink  [phone] 02/17/2021, 8:39 AM     ,

## 2021-02-17 DEATH — deceased

## 2021-02-18 DIAGNOSIS — I639 Cerebral infarction, unspecified: Secondary | ICD-10-CM | POA: Diagnosis not present

## 2021-02-18 LAB — BASIC METABOLIC PANEL
Anion gap: 10 (ref 5–15)
BUN: 14 mg/dL (ref 6–20)
CO2: 27 mmol/L (ref 22–32)
Calcium: 8.2 mg/dL — ABNORMAL LOW (ref 8.9–10.3)
Chloride: 102 mmol/L (ref 98–111)
Creatinine, Ser: 0.48 mg/dL (ref 0.44–1.00)
GFR, Estimated: >60 mL/min (ref >60)
Glucose, Bld: 124 mg/dL — ABNORMAL HIGH (ref 70–99)
Potassium: 4.3 mmol/L (ref 3.5–5.1)
Sodium: 139 mmol/L (ref 135–145)

## 2021-02-18 LAB — CBC
HCT: 36.7 % (ref 36.0–46.0)
Hemoglobin: 11.2 g/dL — ABNORMAL LOW (ref 12.0–15.0)
MCH: 28.2 pg (ref 26.0–34.0)
MCHC: 30.5 g/dL (ref 30.0–36.0)
MCV: 92.4 fL (ref 80.0–100.0)
Platelets: 285 10*3/uL (ref 150–400)
RBC: 3.97 MIL/uL (ref 3.87–5.11)
RDW: 16.6 % — ABNORMAL HIGH (ref 11.5–15.5)
WBC: 13.4 10*3/uL — ABNORMAL HIGH (ref 4.0–10.5)
nRBC: 0 % (ref 0.0–0.2)

## 2021-02-18 LAB — GLUCOSE, CAPILLARY
Glucose-Capillary: 116 mg/dL — ABNORMAL HIGH (ref 70–99)
Glucose-Capillary: 121 mg/dL — ABNORMAL HIGH (ref 70–99)
Glucose-Capillary: 131 mg/dL — ABNORMAL HIGH (ref 70–99)
Glucose-Capillary: 175 mg/dL — ABNORMAL HIGH (ref 70–99)
Glucose-Capillary: 75 mg/dL (ref 70–99)
Glucose-Capillary: 80 mg/dL (ref 70–99)

## 2021-02-18 LAB — HEPARIN LEVEL (UNFRACTIONATED): Heparin Unfractionated: 0.36 IU/mL (ref 0.30–0.70)

## 2021-02-18 LAB — CULTURE, RESPIRATORY W GRAM STAIN

## 2021-02-18 MED ORDER — LOPERAMIDE HCL 1 MG/7.5ML PO SUSP
1.0000 mg | Freq: Once | ORAL | Status: AC | PRN
  Administered 2021-02-18: 1 mg
  Filled 2021-02-18: qty 7.5

## 2021-02-18 MED ORDER — LOPERAMIDE HCL 1 MG/7.5ML PO SUSP
1.0000 mg | Freq: Once | ORAL | Status: DC | PRN
  Filled 2021-02-18: qty 7.5

## 2021-02-18 MED ORDER — CEFAZOLIN SODIUM-DEXTROSE 2-4 GM/100ML-% IV SOLN
2.0000 g | Freq: Three times a day (TID) | INTRAVENOUS | Status: AC
  Administered 2021-02-18 – 2021-02-21 (×10): 2 g via INTRAVENOUS
  Filled 2021-02-18 (×10): qty 100

## 2021-02-18 NOTE — Progress Notes (Signed)
Occupational Therapy Treatment Patient Details Name: Taylor [name] MRN: [id number] DOB: [birth year] Today's Date: 02/18/2021   History of present illness 56 y/o female presented to ED on 12/27 for L sided paralysis, R gaze, slurred speech, and L facial droop. TNK given. S/p mechanical thrombectomy of bilateral occluded P1/PCA. MRI showed acute R PCA infarcts including infarcts in the R thalamus and R occipital lobe with mild mass effect with partial effacement of 3rd ventricle, numerous small acute infarcts in bilateral cerebellar hemispheres and few scattered small acute infarcts in bilateral parietal lobes, L occipital and L frontal lobes. Intubated 12/27. PMH: CHF, sleep apnea, type 2 DM   OT comments  Pt incontinence of loose stool and high risk for skin break down due to decreased mobility. Pt shows activation of L UE and LE during session at EOB. Pt delayed responses but responding. Daughter present at bedside with pending flying back to [place]. Pt has 4 children that are planning to help rotate for her care. Daughter expressed pending plans to move back for mother when possible. Recommendation CIr at this time.    Recommendations for follow up therapy are one component of a multi-disciplinary discharge planning process, led by the attending physician.  Recommendations may be updated based on patient status, additional functional criteria and insurance authorization.    Follow Up Recommendations  Acute inpatient rehab (3hours/day)    Assistance Recommended at Discharge Frequent or constant Supervision/Assistance  Equipment Recommendations  Tub/shower seat;Wheelchair cushion (measurements OT);Wheelchair (measurements OT)    Recommendations for Other Services      Precautions / Restrictions Precautions Precautions: Fall Precaution Comments: vent foley Restrictions Weight Bearing Restrictions: No       Mobility Bed Mobility Overal bed mobility: Needs Assistance Bed Mobility:  Rolling Rolling: Max assist         General bed mobility comments: pt (A) rolling toward L but total (A) to the R side due to L side weakness    Transfers                   General transfer comment: not tested     Balance                                           ADL either performed or assessed with clinical judgement   ADL Overall ADL's : Needs assistance/impaired Eating/Feeding: NPO   Grooming: Total assistance   Upper Body Bathing: Total assistance   Lower Body Bathing: Total assistance   Upper Body Dressing : Total assistance   Lower Body Dressing: Total assistance                 General ADL Comments: requires (A) for all adls    Extremity/Trunk Assessment Upper Extremity Assessment Upper Extremity Assessment: Generalized weakness LUE Deficits / Details: activation of L digits 3rd 4th 5th  with visual attention and R UE movements LUE Coordination: decreased fine motor;decreased gross motor   Lower Extremity Assessment Lower Extremity Assessment: Defer to PT evaluation        Vision   Alignment/Gaze Preference: Gaze left Additional Comments: visually scan R scan with cues   Perception     Praxis      Cognition Arousal/Alertness: Awake/alert Behavior During Therapy: Flat affect Overall Cognitive Status: Difficult to assess  General Comments: following 1 step commands, incontinence of stool and no awareness          Exercises Exercises: Other exercises Other Exercises Other Exercises: bil UE work at eob for shoulder flexion, elbow extension/ flexion and digit activation   Shoulder Instructions       General Comments VSS CPAP 30% peep 5    Pertinent Vitals/ Pain       Pain Assessment: Faces Faces Pain Scale: Hurts little more Facial Expression: Relaxed, neutral Body Movements: Absence of movements Muscle Tension: Relaxed Compliance with ventilator (intubated  pts.): Tolerating ventilator or movement Vocalization (extubated pts.): N/A CPOT Total: 0 Pain Location: buttock with hygiene Pain Descriptors / Indicators: Grimacing  Home Living                                          Prior Functioning/Environment              Frequency  Min 2X/week        Progress Toward Goals  OT Goals(current goals can now be found in the care plan section)  Progress towards OT goals: Progressing toward goals  Acute Rehab OT Goals Patient Stated Goal: none stated OT Goal Formulation: Patient unable to participate in goal setting Time For Goal Achievement: 02/28/21 Potential to Achieve Goals: Fair ADL Goals Pt Will Perform Grooming: with min assist;sitting Pt Will Perform Upper Body Bathing: with mod assist;sitting Pt Will Perform Upper Body Dressing: with mod assist;sitting Additional ADL Goal #1: Tolerate sitting edge of bed for up to 5 min with min A for balance to increase upper body ADL independence.  Plan Discharge plan remains appropriate    Co-evaluation    PT/OT/SLP Co-Evaluation/Treatment: Yes Reason for Co-Treatment: Complexity of the patient's impairments (multi-system involvement);Necessary to address cognition/behavior during functional activity;For patient/therapist safety;To address functional/ADL transfers   OT goals addressed during session: ADL's and self-care;Proper use of Adaptive equipment and DME;Strengthening/ROM      AM-PAC OT "6 Clicks" Daily Activity     Outcome Measure   Help from another person eating meals?: Total Help from another person taking care of personal grooming?: Total Help from another person toileting, which includes using toliet, bedpan, or urinal?: Total Help from another person bathing (including washing, rinsing, drying)?: Total Help from another person to put on and taking off regular upper body clothing?: Total Help from another person to put on and taking off regular lower  body clothing?: Total 6 Click Score: 6    End of Session Equipment Utilized During Treatment: Oxygen  OT Visit Diagnosis: Muscle weakness (generalized) (M62.81);Other symptoms and signs involving cognitive function;Hemiplegia and hemiparesis Hemiplegia - Right/Left: Left Hemiplegia - dominant/non-dominant: Non-Dominant Hemiplegia - caused by: Cerebral infarction   Activity Tolerance Patient tolerated treatment well   Patient Left in bed;with call bell/phone within reach;with bed alarm set;with family/visitor present   Nurse Communication Mobility status;Need for lift equipment;Precautions        Time: 6468-0321 OT Time Calculation (min): 34 min  Charges: OT General Charges $OT Visit: 1 Visit OT Treatments $Self Care/Home Management : 8-22 mins   Brynn, OTR/L  Acute Rehabilitation Services Pager: [phone] Office: [phone] .   Mateo Flow 02/18/2021, 11:33 AM

## 2021-02-18 NOTE — Progress Notes (Signed)
While bathing the patient this RN noted a tear from the flexi. A prior injury in the same area was documented from the rectal pouch. This RN removed the flexi and applied barrier cream to the patient. RN will continue to monitor.

## 2021-02-18 NOTE — Progress Notes (Signed)
eLink Physician-Brief Progress Note Patient Name: Taylor [name] DOB: Feb 26, 1965 MRN: [id number]   Date of Service  02/18/2021  HPI/Events of Note  Has expolsive diarrhea. has had rectal pouch and flexi but has pressure related injuries from both and horrible wounds to that area.  Can we give her immodium?  eICU Interventions  Last stool softener given 12/28 No laxative given Imodium x 1 ordered     Intervention Category Intermediate Interventions: Other:  Darl Pikes 02/18/2021, 9:52 PM

## 2021-02-18 NOTE — Progress Notes (Addendum)
STROKE TEAM PROGRESS NOTE   Patient is seen in her room with no family at the bedside.  She is alert and will communicate by nodding.  She has been hemodynamically stable, and her neurological exam remains stable.  She continues to have a lot of secretions and has trouble with weaning but will attempt to extubate today If CCM feels she is ready Vital signs stable. Vitals:   02/18/21 1000 02/18/21 1132 02/18/21 1200 02/18/21 1300  BP: 114/81 105/71 100/69 96/66  Pulse: (!) 110 (!) 102 (!) 102 93  Resp: (!) 35 (!) 34 (!) 31 (!) 23  Temp:   100.2 F (37.9 C)   TempSrc:   Oral   SpO2: 100% 100% 99% 98%  Weight:      Height:       CBC:  Recent Labs  Lab 02/11/2021 1823 02/05/2021 1831 02/17/21 0244 02/18/21 0627  WBC 7.1   < > 12.3* 13.4*  NEUTROABS 4.8  --   --   --   HGB 15.9*   < > 13.3 11.2*  HCT 49.7*   < > 43.1 36.7  MCV 89.9   < > 93.1 92.4  PLT 260   < > 239 285   < > = values in this interval not displayed.    Basic Metabolic Panel:  Recent Labs  Lab 02/14/21 0105 02/14/21 1459 02/15/21 1636 02/17/21 0244 02/18/21 0627  NA 134*  --    < > 139 139  K 4.1  --    < > 4.6 4.3  CL 105  --    < > 106 102  CO2 20*  --    < > 23 27  GLUCOSE 97  --    < > 175* 124*  BUN 8  --    < > 14 14  CREATININE 0.53  --    < > 0.52 0.48  CALCIUM 8.0*  --    < > 8.0* 8.2*  MG 2.0 2.1  --   --   --   PHOS 3.9 4.1  --   --   --    < > = values in this interval not displayed.    Lipid Panel:  Recent Labs  Lab 02/13/21 0533  CHOL 240*  TRIG 85   86  HDL 72  CHOLHDL 3.3  VLDL 17  LDLCALC 151*    HgbA1c:  Recent Labs  Lab 02/13/21 0533  HGBA1C 11.9*    Urine Drug Screen: No results for input(s): LABOPIA, COCAINSCRNUR, LABBENZ, AMPHETMU, THCU, LABBARB in the last 168 hours.  Alcohol Level  Recent Labs  Lab 02/15/2021 2341  ETH <10     IMAGING past 24 hours No results found.  PHYSICAL EXAM  Temp:  [97.3 F (36.3 C)-100.2 F (37.9 C)] 100.2 F (37.9 C) (01/02  1200) Pulse Rate:  [86-229] 93 (01/02 1300) Resp:  [16-35] 23 (01/02 1300) BP: (96-121)/(66-85) 96/66 (01/02 1300) SpO2:  [93 %-100 %] 98 % (01/02 1300) FiO2 (%):  [30 %] 30 % (01/02 1132) Weight:  [67 kg] 67 kg (01/02 0500)  General -mildly obese [name], intubated off sedation.   Cardiovascular - Regular rate and rhythm. Lungs clear to auscultation Neurological Exam- intubated off sedation, awake alert follows most commands.  Right gaze preference unable to look to the left past midline.  PERRL, horizontal nystagmus present, right gaze preference.  Patient does not blink to threat on left side.  She is able to move all  four extremities in response to commands with full strength in RUE and RLE and 1-2/5 strength in LUE and LLE.    ASSESSMENT/PLAN Ms. ALVARETTA MCPARLAND is a 56 y.o. female with history of CHF, T2DM and OSA presenting with acute onset left-sided weakness, right gaze deviation and slurred speech. She was found to have occlusion of the distal basilar artery, right P1 and proximal right P2.  She was given TNK and received mechanical thrombectomy to the right and left PCA with stenting on the left.  She remains intubated and has not yet been able to wean from the ventilator.  She was found to have two apical thrombi on her 2D echocardiogram and remains on IV heparin.  Stroke:  embolic shower with b/l PCA occlusion s/p IR with TICI3 and left PCA stenting, likely secondary due to embolism from low EF and apical thrombi Code Stroke CT head No acute abnormality.  ASPECTS 10.    CTA head & neck nonopacification of distal basilar artery, right P1 and P2, poor opacification of right vertebral artery, focal stenosis in right A1 and A3 IR with b/l P1 occlusion s/p thrombectomy with TICI3 and left PCA stent MRI  Acute right PCA territory infarct, in right thalamus and occipital lobe, small acute infarcts in bilateral cerebellar hemispheres with scattered small acute  infarcts in bilateral parietal lobes, left occipital and left frontal lobes, area of susceptibility infarct in right occipital lobe suggestive of petechial hemorrhage MRA thrombectomy and placement of left P1/P2 stent with good flow related signal in distal left PCA, poor flow signal in distal basilar tip, poor opacification of right vertebral artery, right A1 and A3 ACA stenosis 2D Echo global hypokinesis with inferior akinesis, EF 0000000, grade 3 diastolic dysfunction, thrombi in inferoapical and anteroapical areas, no atrial level shunt LDL 151 HgbA1c 11.9 VTE prophylaxis -Heparin IV aspirin 81 mg daily prior to admission, Now on heparin IV and Brilinta.  Transition to PO AC when extubated. 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 Sensitivity pending On cefepime Management per CCM  Hyperlipidemia Home meds:  atorvastatin 40 mg daily, resumed in hospital LDL 151, goal < 70 Increase to 80 mg atorvastatin daily  Continue statin at discharge  Diabetes type II Uncontrolled Home meds:  Jardiance 25 mg daily, levemir 17 units BID, metformin 1000 mg BID HgbA1c 11.9, goal < 7.0 CBGs Diabetes coordinator consult SSI  Other Stroke Risk Factors Former cigarette smoker Obstructive sleep apnea  Other Active Problems   Hospital day # [place] , MSN, AGACNP-BC [facility] See Amion for schedule and pager information 02/18/2021 2:04  PM   STROKE MD NOTE : I have personally obtained history,examined this patient, reviewed notes, independently viewed imaging studies, participated in medical decision making and plan of care.ROS completed by me personally and pertinent positives fully documented  I have made any additions or clarifications directly to the above note. Agree with note above.  Patient remains neurologically stable with left hemiplegia and left  visual field loss and following commands.  Continues to require ventilatory support for respiratory failure.  Continue IV heparin for anticoagulation and Brilinta for her PCI stent.  Trial of extubation when ready as per critical care team.  Long discussion at bedside with patient's daughter and answered questions.  Discussed with Dr. Vaughan Browner critical care medicine This patient is critically ill and at significant risk of neurological worsening, death and care requires constant monitoring of vital signs, hemodynamics,respiratory and cardiac monitoring, extensive review of multiple databases, frequent neurological assessment, discussion with family, other specialists and medical decision making of high complexity.I have made any additions or clarifications directly to the above note.This critical care time does not reflect procedure time, or teaching time or supervisory time of PA/NP/Med Resident etc but could involve care discussion time.  I spent 30 minutes of neurocritical care time  in the care of  this patient.     Antony Contras, MD Medical Director [facility] St[facility] Pager: [phone] 02/18/2021 3:04 PM    To contact Stroke Continuity provider, please refer to [email or web address] After hours, contact General Neurology

## 2021-02-18 NOTE — Progress Notes (Addendum)
ANTICOAGULATION CONSULT NOTE - Follow Up Consult  Pharmacy Consult for Heparin Indication:  LV thrombus, s/p CVA   No Known Allergies  Patient Measurements: Height: 5\' 1"  (154.9 cm) Weight: 67 kg IBW/kg (Calculated) : 47.8 Heparin Dosing Weight: 63.4 kg  Vital Signs: Temp: 100.2 F (37.9 C) (01/02 1200) Temp Source: Oral (01/02 1200) BP: 100/69 (01/02 1200) Pulse Rate: 102 (01/02 1200)  Labs: Recent Labs    02/16/21 0341 02/17/21 0244 02/18/21 0627  HGB 13.0 13.3 11.2*  HCT 40.9 43.1 36.7  PLT 216 239 285  HEPARINUNFRC 0.53 0.47 0.36  CREATININE 0.55 0.52 0.48    Estimated Creatinine Clearance: 68.8 mL/min (by C-G formula based on SCr of 0.48 mg/dL).   Assessment: Taylor [name] continuing heparin for new LV thrombus in setting of acute CVA. S/p mechanical thrombectomy and TNK. Heparin level remains therapeutic at 0.0.47. CBC remains WNL. Neurology following.  Goal of Therapy:  Heparin level 0.3-0.5 units/ml Monitor platelets by anticoagulation protocol: Yes Monitor for s/sx of bleeding   Plan:  Continued heparin at 1150 units/hr Daily heparin level and CBC F/u for long-term anticoagulation plan s/p extubation.  09-26-1978 Pharmacy Student 02/18/2021,12:50 PM

## 2021-02-18 NOTE — Progress Notes (Signed)
Physical Therapy Treatment Patient Details Name: Taylor [name] MRN: [id number] DOB: 05-08-1965 Today's Date: 02/18/2021   History of Present Illness 56 y/o female presented to ED on 12/27 for L sided paralysis, R gaze, slurred speech, and L facial droop. TNK given. S/p mechanical thrombectomy of bilateral occluded P1/PCA. MRI showed acute R PCA infarcts including infarcts in the R thalamus and R occipital lobe with mild mass effect with partial effacement of 3rd ventricle, numerous small acute infarcts in bilateral cerebellar hemispheres and few scattered small acute infarcts in bilateral parietal lobes, L occipital and L frontal lobes. Intubated 12/27. PMH: CHF, sleep apnea, type 2 DM    PT Comments    Pt remains on vent but does follow simple commands majority of time. Pt with noted L sided weakness, trace efforts at quad and ankle noted. Pt total A to maintain EOB balance x 6 minutes. Pt weaning off vent currently. Acute PT to cont to follow to progress mobility as able.     Recommendations for follow up therapy are one component of a multi-disciplinary discharge planning process, led by the attending physician.  Recommendations may be updated based on patient status, additional functional criteria and insurance authorization.  Follow Up Recommendations  Acute inpatient rehab (3hours/day)     Assistance Recommended at Discharge Frequent or constant Supervision/Assistance  Equipment Recommendations  Other (comment) (TBD)    Recommendations for Other Services Rehab consult     Precautions / Restrictions Precautions Precautions: Fall Precaution Comments: vent Restrictions Weight Bearing Restrictions: No     Mobility  Bed Mobility Overal bed mobility: Needs Assistance Bed Mobility: Rolling;Sit to Supine;Supine to Sit Rolling: Max assist   Supine to sit: Total assist;+2 for physical assistance Sit to supine: Total assist;+2 for physical assistance   General bed mobility  comments: pt (A) rolling toward L but total (A) to the R side due to L side weakness, totalA for LE and trunk management ot transfer to EOB and return to supine, no active assist    Transfers                   General transfer comment: not tested, pt remains on vent    Ambulation/Gait               General Gait Details: unable at this time   Stairs             Wheelchair Mobility    Modified Rankin (Stroke Patients Only) Modified Rankin (Stroke Patients Only) Pre-Morbid Rankin Score: No significant disability Modified Rankin: Moderately severe disability     Balance Overall balance assessment: Needs assistance Sitting-balance support: Feet supported;No upper extremity supported Sitting balance-Leahy Scale: Fair Sitting balance - Comments: pt unsteady, shaky trying to don shorts   Standing balance support: No upper extremity supported Standing balance-Leahy Scale: Poor Standing balance comment: unsteady requiring external assist                            Cognition Arousal/Alertness: Awake/alert Behavior During Therapy: Flat affect Overall Cognitive Status: Difficult to assess                                 General Comments: following 1 step commands majority of time. incontinence of stool and no awareness, shook head yes/no appropriately        Exercises Other Exercises Other Exercises: worked on R  LE LAQ, AA with L LE, pt with noted quad contraction when trying to hold L LE in extension    General Comments General comments (skin integrity, edema, etc.): VSS 30% PEEP 5, lots of secretions requiring suctioning, dependent for hygiene      Pertinent Vitals/Pain Pain Assessment: Faces Faces Pain Scale: No hurt Pain Location: buttock with hygiene Pain Descriptors / Indicators: Grimacing    Home Living                          Prior Function            PT Goals (current goals can now be found in the  care plan section) Acute Rehab PT Goals Patient Stated Goal: unable to state PT Goal Formulation: Patient unable to participate in goal setting Time For Goal Achievement: 02/28/21 Potential to Achieve Goals: Fair Progress towards PT goals: Progressing toward goals    Frequency    Min 3X/week      PT Plan Current plan remains appropriate    Co-evaluation PT/OT/SLP Co-Evaluation/Treatment: Yes Reason for Co-Treatment: Complexity of the patient's impairments (multi-system involvement) PT goals addressed during session: Mobility/safety with mobility OT goals addressed during session: ADL's and self-care;Proper use of Adaptive equipment and DME;Strengthening/ROM      AM-PAC PT "6 Clicks" Mobility   Outcome Measure  Help needed turning from your back to your side while in a flat bed without using bedrails?: Total Help needed moving from lying on your back to sitting on the side of a flat bed without using bedrails?: Total Help needed moving to and from a bed to a chair (including a wheelchair)?: Total Help needed standing up from a chair using your arms (e.g., wheelchair or bedside chair)?: Total Help needed to walk in hospital room?: Total Help needed climbing 3-5 steps with a railing? : Total 6 Click Score: 6    End of Session Equipment Utilized During Treatment: Oxygen Activity Tolerance: Patient tolerated treatment well Patient left: in bed;with call bell/phone within reach;with restraints reapplied Nurse Communication: Mobility status PT Visit Diagnosis: Unsteadiness on feet (R26.81);Muscle weakness (generalized) (M62.81);Difficulty in walking, not elsewhere classified (R26.2);Other symptoms and signs involving the nervous system (R29.898)     Time: [id number] PT Time Calculation (min) (ACUTE ONLY): 35 min  Charges:  $Therapeutic Activity: 8-22 mins                     Kittie Plater, PT, DPT Acute Rehabilitation Services Pager #: [phone] Office #:  [phone]    Berline Lopes 02/18/2021, 1:35 PM

## 2021-02-18 NOTE — Progress Notes (Signed)
SLP Cancellation Note  Patient Details Name: CYRENE GHARIBIAN MRN: [id number] DOB: [birth year]   Cancelled treatment:       Reason Eval/Treat Not Completed: Patient not medically ready (on vent this am). Will continue to follow.    Mahala Menghini., M.A. CCC-SLP Acute Rehabilitation Services Pager [phone] Office [phone]  02/18/2021, 8:35 AM

## 2021-02-18 NOTE — Progress Notes (Signed)
Patient placed back on full ventilator support for night rest.

## 2021-02-18 NOTE — Progress Notes (Signed)
NAME:  Taylor [name], MRN:  [id number], DOB:  [birth year], LOS: 6 ADMISSION DATE:  01/18/2021, CONSULTATION DATE:  12/27 REFERRING MD:  Dr Tennis Must Sindy Messing , CHIEF COMPLAINT:   CVA  History of Present Illness:  56 year old female with PMH as below, which is significant for HFrEF with LVEF 20-25 % (2017), DM, and OSA on CPAP. 12/27 she presented to [facility] ED with complaints of acute onset left sided weakness and slurred speech. EMS noted non-verbal and right gaze preference. Imaging consistent with occlusion of the distal basilar arterty, right P1, and proximal R P2. She was given systemic TNK and mechanical thrombectomy  with stent retriever on the right with complete recanalization after one pass (TICI3). PCCM consulted for vent management and ICU care.    Pertinent  Medical History   has a past medical history of CHF (congestive heart failure) ([place]), Diabetes mellitus without complication ([place]), and Obstructive sleep apnea.   Significant Hospital Events: Including procedures, antibiotic start and stop dates in addition to other pertinent events   12/27 admit for posterior circulation stroke. TNK given and IR throbmectomy 12/28 echo with LV thrombus, EF 20-25%.  Started on heparin 12/30 failing weaning trials.  Started on cefepime for positive UA 1/1 On PSV weans  Interim History / Subjective:   On pressure support weans.  Not ready for extubation  Objective   Blood pressure 121/85, pulse (!) 105, temperature 99.3 F (37.4 C), temperature source Oral, resp. rate (!) 34, height 5\' 1"  (1.549 m), weight 67 kg, SpO2 98 %.    Vent Mode: PSV;CPAP FiO2 (%):  [30 %] 30 % Set Rate:  [18 bmp] 18 bmp Vt Set:  [380 mL] 380 mL PEEP:  [5 cmH20] 5 cmH20 Pressure Support:  [12 cmH20-15 cmH20] 15 cmH20 Plateau Pressure:  [17 cmH20] 17 cmH20   Intake/Output Summary (Last 24 hours) at 02/18/2021 0940 Last data filed at 02/18/2021 0900 Gross per 24 hour  Intake 2763.14 ml  Output 700 ml  Net  2063.14 ml   Filed Weights   02/16/21 0500 02/17/21 0500 02/18/21 0500  Weight: 68.3 kg 68.3 kg 67 kg    Examination: Gen:      No acute distress HEENT:  EOMI, sclera anicteric Neck:     No masses; no thyromegaly, ETT Lungs:    Clear to auscultation bilaterally; normal respiratory effort CV:         Regular rate and rhythm; no murmurs Abd:      + bowel sounds; soft, non-tender; no palpable masses, no distension Ext:    No edema; adequate peripheral perfusion Skin:      Warm and dry; no rash Neuro: Awake, responsive  Labs/imaging reviewed Metabolic panel is stable.  WBC count up to 13.4 No new imaging  Resolved Hospital Problem list     Assessment & Plan:  CVA: basilar, bilateral PI/PCA.  TNK given in ED and she is s/p mechanical thrombectomy on the right and mechanical thrombectomy with stent placement on the L.  Management per neurology  Acute resp failure, inability to protect airway due to stroke OSA on CPAP Continue vent support SBT's as tolerated Follow intermittent chest x-ray Will need nocturnal CPAP after extubation  Chronic HFrEF: LVEF 20-25% LV thrombus Continue heparin drip.  Consider changing to DOAC Continue midodrine  E. coli UTI, MSSA pneumonia Change antibiotics to cefazolin based on sensitivities  DM SSI coverage Tube feeding coverage Holding home glimepiride, levemir, metformin  Best Practice (right click  and "Reselect all SmartList Selections" daily)   Diet/type: tubefeeds DVT prophylaxis: systemic heparin GI prophylaxis: PPI Lines: N/A Foley:  Yes, and it is no longer needed and removal ordered  Code Status:  full code Last date of multidisciplinary goals of care discussion [ ]   Critical care time:    The patient is critically ill with multiple organ system failure and requires high complexity decision making for assessment and support, frequent evaluation and titration of therapies, advanced monitoring, review of radiographic studies  and interpretation of complex data.   Critical Care Time devoted to patient care services, exclusive of separately billable procedures, described in this note is 35 minutes.   Marshell Garfinkel MD [place] [facility] See Amion for pager  If no response to pager , please call [phone] until 7pm After 7:00 pm call Elink  [phone] 02/18/2021, 9:44 AM   ,

## 2021-02-19 DIAGNOSIS — I639 Cerebral infarction, unspecified: Secondary | ICD-10-CM | POA: Diagnosis not present

## 2021-02-19 LAB — CBC
HCT: 41.4 % (ref 36.0–46.0)
Hemoglobin: 12.8 g/dL (ref 12.0–15.0)
MCH: 28.6 pg (ref 26.0–34.0)
MCHC: 30.9 g/dL (ref 30.0–36.0)
MCV: 92.4 fL (ref 80.0–100.0)
Platelets: 301 10*3/uL (ref 150–400)
RBC: 4.48 MIL/uL (ref 3.87–5.11)
RDW: 16.8 % — ABNORMAL HIGH (ref 11.5–15.5)
WBC: 11.7 10*3/uL — ABNORMAL HIGH (ref 4.0–10.5)
nRBC: 0 % (ref 0.0–0.2)

## 2021-02-19 LAB — BASIC METABOLIC PANEL
Anion gap: 8 (ref 5–15)
BUN: 12 mg/dL (ref 6–20)
CO2: 28 mmol/L (ref 22–32)
Calcium: 8.1 mg/dL — ABNORMAL LOW (ref 8.9–10.3)
Chloride: 100 mmol/L (ref 98–111)
Creatinine, Ser: 0.55 mg/dL (ref 0.44–1.00)
GFR, Estimated: >60 mL/min (ref >60)
Glucose, Bld: 129 mg/dL — ABNORMAL HIGH (ref 70–99)
Potassium: 4.4 mmol/L (ref 3.5–5.1)
Sodium: 136 mmol/L (ref 135–145)

## 2021-02-19 LAB — GLUCOSE, CAPILLARY
Glucose-Capillary: 119 mg/dL — ABNORMAL HIGH (ref 70–99)
Glucose-Capillary: 139 mg/dL — ABNORMAL HIGH (ref 70–99)
Glucose-Capillary: 141 mg/dL — ABNORMAL HIGH (ref 70–99)
Glucose-Capillary: 157 mg/dL — ABNORMAL HIGH (ref 70–99)
Glucose-Capillary: 172 mg/dL — ABNORMAL HIGH (ref 70–99)
Glucose-Capillary: 177 mg/dL — ABNORMAL HIGH (ref 70–99)

## 2021-02-19 LAB — HEPARIN LEVEL (UNFRACTIONATED): Heparin Unfractionated: 0.47 IU/mL (ref 0.30–0.70)

## 2021-02-19 MED ORDER — FUROSEMIDE 10 MG/ML IJ SOLN
20.0000 mg | Freq: Once | INTRAMUSCULAR | Status: AC
  Administered 2021-02-19: 20 mg via INTRAVENOUS
  Filled 2021-02-19: qty 2

## 2021-02-19 MED ORDER — BETHANECHOL CHLORIDE 10 MG PO TABS
10.0000 mg | ORAL_TABLET | Freq: Three times a day (TID) | ORAL | Status: AC
  Administered 2021-02-19 – 2021-02-21 (×9): 10 mg
  Filled 2021-02-19 (×9): qty 1

## 2021-02-19 MED ORDER — LOPERAMIDE HCL 2 MG PO CAPS
4.0000 mg | ORAL_CAPSULE | ORAL | Status: DC | PRN
  Filled 2021-02-19: qty 2

## 2021-02-19 MED ORDER — LOPERAMIDE HCL 1 MG/7.5ML PO SUSP
4.0000 mg | Freq: Three times a day (TID) | ORAL | Status: AC | PRN
  Administered 2021-02-19 – 2021-02-21 (×3): 4 mg
  Filled 2021-02-19 (×5): qty 30

## 2021-02-19 MED ORDER — BETHANECHOL CHLORIDE 10 MG PO TABS: 10.0000 mg | ORAL_TABLET | Freq: Three times a day (TID) | ORAL | Status: DC

## 2021-02-19 NOTE — Progress Notes (Signed)
STROKE TEAM PROGRESS NOTE   Patient remains intubated for CHF and respiratory failure and is currently on weaning trial.  She is alert and will communicate by nodding.  She has been hemodynamically stable, and her neurological exam remains stable.  She continues to have a lot of secretions and has trouble with weaning but will continue to attempt to wean.  She had some diarrhea overnight and was given Imodium.  Neurological exam is unchanged.  Vital signs stable.  She remains on heparin drip for LV thrombus Vitals:   02/19/21 1400 02/19/21 1430 02/19/21 1530 02/19/21 1539  BP: 96/69 93/69    Pulse: 89 89    Resp: 18 18    Temp:    98.8 F (37.1 C)  TempSrc:    Axillary  SpO2: 100% 100% 100%   Weight:      Height:       CBC:  Recent Labs  Lab 01/30/2021 1823 02/13/2021 1831 02/18/21 0627 02/19/21 0536  WBC 7.1   < > 13.4* 11.7*  NEUTROABS 4.8  --   --   --   HGB 15.9*   < > 11.2* 12.8  HCT 49.7*   < > 36.7 41.4  MCV 89.9   < > 92.4 92.4  PLT 260   < > 285 301   < > = values in this interval not displayed.   Basic Metabolic Panel:  Recent Labs  Lab 02/14/21 0105 02/14/21 1459 02/15/21 1636 02/18/21 0627 02/19/21 0536  NA 134*  --    < > 139 136  K 4.1  --    < > 4.3 4.4  CL 105  --    < > 102 100  CO2 20*  --    < > 27 28  GLUCOSE 97  --    < > 124* 129*  BUN 8  --    < > 14 12  CREATININE 0.53  --    < > 0.48 0.55  CALCIUM 8.0*  --    < > 8.2* 8.1*  MG 2.0 2.1  --   --   --   PHOS 3.9 4.1  --   --   --    < > = values in this interval not displayed.   Lipid Panel:  Recent Labs  Lab 02/13/21 0533  CHOL 240*  TRIG 85   86  HDL 72  CHOLHDL 3.3  VLDL 17  LDLCALC 151*   HgbA1c:  Recent Labs  Lab 02/13/21 0533  HGBA1C 11.9*   Urine Drug Screen: No results for input(s): LABOPIA, COCAINSCRNUR, LABBENZ, AMPHETMU, THCU, LABBARB in the last 168 hours.  Alcohol Level  Recent Labs  Lab 02/16/2021 2341  ETH <10    IMAGING past 24 hours No results  found.  PHYSICAL EXAM  Temp:  [98.5 F (36.9 C)-99.8 F (37.7 C)] 98.8 F (37.1 C) (01/03 1539) Pulse Rate:  [89-106] 89 (01/03 1430) Resp:  [0-32] 18 (01/03 1430) BP: (92-115)/(63-86) 93/69 (01/03 1430) SpO2:  [92 %-100 %] 100 % (01/03 1530) FiO2 (%):  [30 %] 30 % (01/03 1530) Weight:  [68.1 kg] 68.1 kg (01/03 0500)  General -mildly obese [name], intubated off sedation.   Cardiovascular - Regular rate and rhythm. Lungs clear to auscultation Neurological Exam- intubated off sedation, awake alert follows most commands.  Right gaze preference unable to look to the left past midline.  PERRL, horizontal nystagmus present, right gaze preference.  Patient does not blink to threat  on left side.  She is able to move all four extremities in response to commands with full strength in RUE and RLE and 1-2/5 strength in LUE and LLE.    ASSESSMENT/PLAN Ms. SHEVA ODER is a 56 y.o. female with history of CHF, T2DM and OSA presenting with acute onset left-sided weakness, right gaze deviation and slurred speech. She was found to have occlusion of the distal basilar artery, right P1 and proximal right P2.  She was given TNK and received mechanical thrombectomy to the right and left PCA with stenting on the left.  She remains intubated and has not yet been able to wean from the ventilator.  She was found to have two apical thrombi on her 2D echocardiogram and remains on IV heparin.  Stroke:  embolic shower with b/l PCA occlusion s/p IR with TICI3 and left PCA stenting, likely secondary due to embolism from low EF and apical thrombi Code Stroke CT head No acute abnormality.  ASPECTS 10.    CTA head & neck nonopacification of distal basilar artery, right P1 and P2, poor opacification of right vertebral artery, focal stenosis in right A1 and A3 IR with b/l P1 occlusion s/p thrombectomy with TICI3 and left PCA stent MRI  Acute right PCA territory infarct, in right thalamus and  occipital lobe, small acute infarcts in bilateral cerebellar hemispheres with scattered small acute infarcts in bilateral parietal lobes, left occipital and left frontal lobes, area of susceptibility infarct in right occipital lobe suggestive of petechial hemorrhage MRA thrombectomy and placement of left P1/P2 stent with good flow related signal in distal left PCA, poor flow signal in distal basilar tip, poor opacification of right vertebral artery, right A1 and A3 ACA stenosis 2D Echo global hypokinesis with inferior akinesis, EF 0000000, grade 3 diastolic dysfunction, thrombi in inferoapical and anteroapical areas, no atrial level shunt LDL 151 HgbA1c 11.9 VTE prophylaxis -Heparin IV aspirin 81 mg daily prior to admission, Now on heparin IV and Brilinta.  Transition to PO AC when extubated. 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% Two apical thrombi present Now on heparin IV and Brilinta  Respiratory failure Currently intubated and ventilated Management per CCM Attempt to wean off ventilatory support today  Hx of hypertension Hypotension Home meds:  lisinopril 2.5 mg daily Stable on the low end SBP 90-100s Off Levophed On midodrine 10 mg every 8h Long-term BP goal normotensive  Fever Leukocytosis UTI WBC 10.8-> 11.9->15.3-> 13.4 T-max 100.6 UA WBC > 50 Urine culture Gram neg rods. Sensitivity pending On cefepime Management per CCM  Hyperlipidemia Home meds:  atorvastatin 40 mg daily, resumed in hospital LDL 151, goal < 70 Increase to 80 mg atorvastatin daily  Continue statin at discharge  Diabetes type II Uncontrolled Home meds:  Jardiance 25 mg daily, levemir 17 units BID, metformin 1000 mg BID HgbA1c 11.9, goal < 7.0 CBGs Diabetes coordinator consult SSI  Other Stroke Risk Factors Former cigarette smoker Obstructive sleep apnea  Other Active Problems   Hospital day # 7     Patient remains intubated on ventilatory support for CHF and respiratory failure but is neurologically stable with left hemiplegia and left visual field loss and following commands.  Continue IV heparin for anticoagulation and Brilinta for her PCI stent.  Trial of extubation when ready as per critical care team.  Long discussion at bedside with patient and answered questions.  Discussed with Dr. Erin Fulling critical care medicine This patient  is critically ill and at significant risk of neurological worsening, death and care requires constant monitoring of vital signs, hemodynamics,respiratory and cardiac monitoring, extensive review of multiple databases, frequent neurological assessment, discussion with family, other specialists and medical decision making of high complexity.I have made any additions or clarifications directly to the above note.This critical care time does not reflect procedure time, or teaching time or supervisory time of PA/NP/Med Resident etc but could involve care discussion time.  I spent 31 minutes of neurocritical care time  in the care of  this patient.     Antony Contras, MD Medical Director [facility] Pager: [phone] 02/19/2021 3:47 PM    To contact Stroke Continuity provider, please refer to [email or web address] After hours, contact General Neurology

## 2021-02-19 NOTE — Progress Notes (Addendum)
Nutrition Follow-up  DOCUMENTATION CODES:   Not applicable  INTERVENTION:   Tube feeding via cortrak tube: Glucerna 1.5 at 45 ml/h (1080 ml per day) Prosource TF 45 ml daily  Provides 1660 kcal, 100 gm protein, 820 ml free water daily  MVI with minerals daily   NUTRITION DIAGNOSIS:   Inadequate oral intake related to inability to eat as evidenced by NPO status. Ongoing.   GOAL:   Patient will meet greater than or equal to 90% of their needs Met with TF  MONITOR:   TF tolerance  REASON FOR ASSESSMENT:   Consult Enteral/tube feeding initiation and management  ASSESSMENT:   Pt with PMH of HF with EF 20-25%, uncontrolled DM, OSA on CPAP admitted with bil basilar stroke s/p TNK and IR.   Pt discussed during ICU rounds and with RN. Planning for extubation. Given imodium for diarrhea   12/27 s/p TNK and IR 12/28 started TF 12/30 s/p cortrak placement; tip in distal stomach  1/2 given imodium   Patient is currently intubated on ventilator support MV: 9.1 L/min Temp (24hrs), Avg:98.7 F (37.1 C), Min:98.5 F (36.9 C), Max:99 F (37.2 C)  Propofol: 3 ml/hr provides: 79 kcal   Medications reviewed and include: SSI, MVI with minerals daily, protonix NS @ 50 ml/hr  Labs reviewed: A1C: 11.9 CBG's: 54-349  OG tube: tip in gastric fundus per xray    Diet Order:   Diet Order             Diet NPO time specified  Diet effective now                   EDUCATION NEEDS:   No education needs have been identified at this time  Skin:  Skin Assessment: Reviewed RN Assessment  Last BM:  12/28 large  Height:   Ht Readings from Last 1 Encounters:  02/14/2021 5' 1"  (1.549 m)    Weight:   Wt Readings from Last 1 Encounters:  02/19/21 68.1 kg    Ideal Body Weight:     BMI:  Body mass index is 28.37 kg/m.  Estimated Nutritional Needs:   Kcal:  1600  Protein:  85-100 grams  Fluid:  > 1.6 L/day  Lockie Pares., RD, LDN, CNSC See AMiON for contact  information

## 2021-02-19 NOTE — Progress Notes (Signed)
Physical Therapy Treatment Patient Details Name: Taylor [name] MRN: [id number] DOB: Jan 17, 1966 Today's Date: 02/19/2021   History of Present Illness 56 y/o female presented to ED on 12/27 for L sided paralysis, R gaze, slurred speech, and L facial droop. TNK given. S/p mechanical thrombectomy of bilateral occluded P1/PCA. MRI showed acute R PCA infarcts including infarcts in the R thalamus and R occipital lobe with mild mass effect with partial effacement of 3rd ventricle, numerous small acute infarcts in bilateral cerebellar hemispheres and few scattered small acute infarcts in bilateral parietal lobes, L occipital and L frontal lobes. Intubated 12/27. PMH: CHF, sleep apnea, type 2 DM    PT Comments    Pt with improved level of alertness and consistent command follow today. Pt also with more movement in L hand and LE however remains to have significant L hemiplegia. Pt tolerated EOB x 10 min today with mod/maX for support. Engaged in exercises as well. Remains to be weaning off vent, continues with significant amount of secretions. Acute PT to follow.    Recommendations for follow up therapy are one component of a multi-disciplinary discharge planning process, led by the attending physician.  Recommendations may be updated based on patient status, additional functional criteria and insurance authorization.  Follow Up Recommendations  Acute inpatient rehab (3hours/day)     Assistance Recommended at Discharge Frequent or constant Supervision/Assistance  Patient can return home with the following     Equipment Recommendations  Other (comment) (TBD)    Recommendations for Other Services Rehab consult     Precautions / Restrictions Precautions Precautions: Fall Precaution Comments: vent     Mobility  Bed Mobility Overal bed mobility: Needs Assistance Bed Mobility: Sit to Supine;Supine to Sit     Supine to sit: Max assist;+2 for physical assistance Sit to supine: Total assist;+2 for  physical assistance   General bed mobility comments: with max verbal cues pt able to move R LE towards EOB with increased time and modA for L LE, totalx2 to return to bed    Transfers                   General transfer comment: not tested, pt remains on vent    Ambulation/Gait               General Gait Details: unable at this time   Stairs             Wheelchair Mobility    Modified Rankin (Stroke Patients Only) Modified Rankin (Stroke Patients Only) Pre-Morbid Rankin Score: No significant disability Modified Rankin: Moderately severe disability     Balance Overall balance assessment: Needs assistance Sitting-balance support: Feet supported;No upper extremity supported Sitting balance-Leahy Scale: Fair Sitting balance - Comments: pt unsteady, shaky trying to don shorts   Standing balance support: No upper extremity supported Standing balance-Leahy Scale: Poor Standing balance comment: unsteady requiring external assist                            Cognition Arousal/Alertness: Awake/alert Behavior During Therapy: Flat affect Overall Cognitive Status: Difficult to assess                                 General Comments: consistently follows 1 step commands, began nodding head yes/no appropriately        Exercises Other Exercises Other Exercises: worked on [employer], [place]  with L LE, pt with noted quad contraction when trying to hold L LE in extension Other Exercises: worked on correcting to midline sitting EOB posture as pt with L lateral lean, continues to require mod/maxA    General Comments        Pertinent Vitals/Pain Pain Assessment: Faces Faces Pain Scale: No hurt Facial Expression: Relaxed, neutral Body Movements: Absence of movements Muscle Tension: Relaxed Compliance with ventilator (intubated pts.): Tolerating ventilator or movement Vocalization (extubated pts.): N/A CPOT Total: 0    Home Living                           Prior Function            PT Goals (current goals can now be found in the care plan section) Acute Rehab PT Goals Patient Stated Goal: unable to state PT Goal Formulation: Patient unable to participate in goal setting Time For Goal Achievement: 02/28/21 Potential to Achieve Goals: Fair Progress towards PT goals: Progressing toward goals    Frequency    Min 3X/week      PT Plan Current plan remains appropriate    Co-evaluation PT/OT/SLP Co-Evaluation/Treatment: Yes            AM-PAC PT "6 Clicks" Mobility   Outcome Measure  Help needed turning from your back to your side while in a flat bed without using bedrails?: Total Help needed moving from lying on your back to sitting on the side of a flat bed without using bedrails?: Total Help needed moving to and from a bed to a chair (including a wheelchair)?: Total Help needed standing up from a chair using your arms (e.g., wheelchair or bedside chair)?: Total Help needed to walk in hospital room?: Total Help needed climbing 3-5 steps with a railing? : Total 6 Click Score: 6    End of Session Equipment Utilized During Treatment: Oxygen Activity Tolerance: Patient tolerated treatment well Patient left: in bed;with call bell/phone within reach;with restraints reapplied Nurse Communication: Mobility status PT Visit Diagnosis: Unsteadiness on feet (R26.81);Muscle weakness (generalized) (M62.81);Difficulty in walking, not elsewhere classified (R26.2);Other symptoms and signs involving the nervous system (R29.898)     Time: [id number] PT Time Calculation (min) (ACUTE ONLY): 30 min  Charges:  $Therapeutic Exercise: 8-22 mins $Therapeutic Activity: 8-22 mins                     Kittie Plater, PT, DPT Acute Rehabilitation Services Pager #: [phone] Office #: [phone]    Berline Lopes 02/19/2021, 2:39 PM

## 2021-02-19 NOTE — Progress Notes (Signed)
NAME:  Eden EmmsLori A Charlesworth, MRN:  [id number], DOB:  March 23, 1965, LOS: 7 ADMISSION DATE:  01-01-21, CONSULTATION DATE:  12/27 REFERRING MD:  Dr Tommi Rumpsde Melchor AmourMacedo Rodrigues , CHIEF COMPLAINT:   CVA  History of Present Illness:  56 year old female with PMH as below, which is significant for HFrEF with LVEF 20-25 % (2017), DM, and OSA on CPAP. 12/27 [facility] with complaints of acute onset left sided weakness and slurred speech. EMS noted non-verbal and right gaze preference. Imaging consistent with occlusion of the distal basilar arterty, right P1, and proximal R P2. She was given systemic TNK and mechanical thrombectomy  with stent retriever on the right with complete recanalization after one pass (TICI3). PCCM consulted for vent management and ICU care.    Pertinent  Medical History   has a past medical history of CHF (congestive heart failure) ([facility]), Diabetes mellitus without complication ([facility]), and Obstructive sleep apnea.   Significant Hospital Events: Including procedures, antibiotic start and stop dates in addition to other pertinent events   12/27 admit for posterior circulation stroke. TNK given and IR throbmectomy 12/28 echo with LV thrombus, EF 20-25%.  Started on heparin 12/30 failing weaning trials.  Started on cefepime for positive UA 1/1 On PSV weans  Interim History / Subjective:   Tolerating PSV on 12/5. Started to have profuse diarrhea overnight. Given imodium.   Objective   Blood pressure 111/79, pulse 99, temperature 98.5 F (36.9 C), temperature source Axillary, resp. rate (!) 27, height 5\' 1"  (1.549 m), weight 68.1 kg, SpO2 97 %.    Vent Mode: PSV;CPAP FiO2 (%):  [30 %] 30 % Set Rate:  [18 bmp] 18 bmp Vt Set:  [380 mL] 380 mL PEEP:  [5 cmH20-6 cmH20] 5 cmH20 Pressure Support:  [12 cmH20] 12 cmH20 Plateau Pressure:  [17 cmH20-18 cmH20] 17 cmH20   Intake/Output Summary (Last 24 hours) at 02/19/2021 0948 Last data filed at 02/19/2021 0900 Gross per 24 hour   Intake 2644.46 ml  Output 900 ml  Net 1744.46 ml   Filed Weights   02/17/21 0500 02/18/21 0500 02/19/21 0500  Weight: 68.3 kg 67 kg 68.1 kg    Examination: Gen:      No acute distress HEENT:  EOMI, sclera anicteric Neck:     No masses; no thyromegaly, ETT Lungs:    Clear to auscultation bilaterally; normal respiratory effort CV:         Regular rate and rhythm; no murmurs Abd:      + bowel sounds; soft, non-tender; no palpable masses, no distension Ext:    No edema; adequate peripheral perfusion Skin:      Warm and dry; no rash Neuro: Awake, responsive   Resolved Hospital Problem list     Assessment & Plan:  CVA: basilar, bilateral PI/PCA.  TNK given in ED and she is s/p mechanical thrombectomy on the right and mechanical thrombectomy with stent placement on the L.  Management per neurology  Acute resp failure, inability to protect airway due to stroke OSA on CPAP Continue vent support SBT's as tolerated, will try to wean PS today as she has been tolerating 12/5 Follow intermittent chest x-ray Will need nocturnal CPAP after extubation  Chronic HFrEF: LVEF 20-25% LV thrombus Continue heparin drip.  Consider changing to DOAC Continue midodrine  E. coli UTI, MSSA pneumonia Continue cefazolin  DM SSI coverage Holding home glimepiride, levemir, metformin  Best Practice (right click and "Reselect all SmartList Selections" daily)  Diet/type: tubefeeds DVT prophylaxis: systemic heparin GI prophylaxis: PPI Lines: N/A Foley:  Yes, and it is no longer needed and removal ordered  Code Status:  full code Last date of multidisciplinary goals of care discussion [ ]   Critical care time:    The patient is critically ill with multiple organ system failure and requires high complexity decision making for assessment and support, frequent evaluation and titration of therapies, advanced monitoring, review of radiographic studies and interpretation of complex data.    Critical Care Time devoted to patient care services, exclusive of separately billable procedures, described in this note is 40 minutes.   Freda Jackson, MD [place] [facility] Office: [phone]   See Amion for personal pager PCCM on call pager [phone] until 7pm. Please call Elink 7p-7a. [phone]   ,

## 2021-02-20 ENCOUNTER — Inpatient Hospital Stay (HOSPITAL_COMMUNITY): Payer: 59

## 2021-02-20 DIAGNOSIS — I639 Cerebral infarction, unspecified: Secondary | ICD-10-CM | POA: Diagnosis not present

## 2021-02-20 LAB — CBC
HCT: 39.7 % (ref 36.0–46.0)
Hemoglobin: 11.9 g/dL — ABNORMAL LOW (ref 12.0–15.0)
MCH: 28.4 pg (ref 26.0–34.0)
MCHC: 30 g/dL (ref 30.0–36.0)
MCV: 94.7 fL (ref 80.0–100.0)
Platelets: 307 10*3/uL (ref 150–400)
RBC: 4.19 MIL/uL (ref 3.87–5.11)
RDW: 17 % — ABNORMAL HIGH (ref 11.5–15.5)
WBC: 14.1 10*3/uL — ABNORMAL HIGH (ref 4.0–10.5)
nRBC: 0.3 % — ABNORMAL HIGH (ref 0.0–0.2)

## 2021-02-20 LAB — POCT I-STAT 7, (LYTES, BLD GAS, ICA,H+H)
Acid-Base Excess: 11 mmol/L — ABNORMAL HIGH (ref 0.0–2.0)
Bicarbonate: 36.1 mmol/L — ABNORMAL HIGH (ref 20.0–28.0)
Calcium, Ion: 1.15 mmol/L (ref 1.15–1.40)
HCT: 39 % (ref 36.0–46.0)
Hemoglobin: 13.3 g/dL (ref 12.0–15.0)
O2 Saturation: 100 %
Patient temperature: 97.1
Potassium: 3.8 mmol/L (ref 3.5–5.1)
Sodium: 139 mmol/L (ref 135–145)
TCO2: 38 mmol/L — ABNORMAL HIGH (ref 22–32)
pCO2 arterial: 47.7 mmHg (ref 32.0–48.0)
pH, Arterial: 7.485 — ABNORMAL HIGH (ref 7.350–7.450)
pO2, Arterial: 160 mmHg — ABNORMAL HIGH (ref 83.0–108.0)

## 2021-02-20 LAB — COMPREHENSIVE METABOLIC PANEL
ALT: 22 U/L (ref 0–44)
AST: 37 U/L (ref 15–41)
Albumin: 1.6 g/dL — ABNORMAL LOW (ref 3.5–5.0)
Alkaline Phosphatase: 165 U/L — ABNORMAL HIGH (ref 38–126)
Anion gap: 7 (ref 5–15)
BUN: 15 mg/dL (ref 6–20)
CO2: 31 mmol/L (ref 22–32)
Calcium: 8.5 mg/dL — ABNORMAL LOW (ref 8.9–10.3)
Chloride: 99 mmol/L (ref 98–111)
Creatinine, Ser: 0.61 mg/dL (ref 0.44–1.00)
GFR, Estimated: >60 mL/min (ref >60)
Glucose, Bld: 131 mg/dL — ABNORMAL HIGH (ref 70–99)
Potassium: 5.4 mmol/L — ABNORMAL HIGH (ref 3.5–5.1)
Sodium: 137 mmol/L (ref 135–145)
Total Bilirubin: 1.2 mg/dL (ref 0.3–1.2)
Total Protein: 5.7 g/dL — ABNORMAL LOW (ref 6.5–8.1)

## 2021-02-20 LAB — GLUCOSE, CAPILLARY
Glucose-Capillary: 171 mg/dL — ABNORMAL HIGH (ref 70–99)
Glucose-Capillary: 136 mg/dL — ABNORMAL HIGH (ref 70–99)
Glucose-Capillary: 149 mg/dL — ABNORMAL HIGH (ref 70–99)
Glucose-Capillary: 175 mg/dL — ABNORMAL HIGH (ref 70–99)
Glucose-Capillary: 264 mg/dL — ABNORMAL HIGH (ref 70–99)
Glucose-Capillary: 219 mg/dL — ABNORMAL HIGH (ref 70–99)

## 2021-02-20 LAB — HEPARIN LEVEL (UNFRACTIONATED): Heparin Unfractionated: 0.35 IU/mL (ref 0.30–0.70)

## 2021-02-20 MED ORDER — FENTANYL CITRATE PF 50 MCG/ML IJ SOSY
PREFILLED_SYRINGE | INTRAMUSCULAR | Status: AC
  Filled 2021-02-20: qty 2

## 2021-02-20 MED ORDER — ETOMIDATE 2 MG/ML IV SOLN
INTRAVENOUS | Status: AC
  Administered 2021-02-20: 20 mg via INTRAVENOUS
  Filled 2021-02-20: qty 10

## 2021-02-20 MED ORDER — MIDAZOLAM HCL 2 MG/2ML IJ SOLN
4.0000 mg | Freq: Once | INTRAMUSCULAR | Status: AC
  Administered 2021-02-20: 2 mg via INTRAVENOUS

## 2021-02-20 MED ORDER — RACEPINEPHRINE HCL 2.25 % IN NEBU
0.5000 mL | INHALATION_SOLUTION | Freq: Once | RESPIRATORY_TRACT | Status: AC
  Administered 2021-02-20: 0.5 mL via RESPIRATORY_TRACT

## 2021-02-20 MED ORDER — RACEPINEPHRINE HCL 2.25 % IN NEBU
INHALATION_SOLUTION | RESPIRATORY_TRACT | Status: AC
  Filled 2021-02-20: qty 0.5

## 2021-02-20 MED ORDER — ROCURONIUM BROMIDE 10 MG/ML (PF) SYRINGE
PREFILLED_SYRINGE | INTRAVENOUS | Status: AC
  Administered 2021-02-20: 100 mg via INTRAVENOUS
  Filled 2021-02-20: qty 10

## 2021-02-20 MED ORDER — MIDAZOLAM HCL 2 MG/2ML IJ SOLN
INTRAMUSCULAR | Status: AC
  Filled 2021-02-20: qty 2

## 2021-02-20 MED ORDER — DEXMEDETOMIDINE HCL IN NACL 400 MCG/100ML IV SOLN: 0.4000 ug/kg/h | INTRAVENOUS | Status: DC

## 2021-02-20 MED ORDER — SUCCINYLCHOLINE CHLORIDE 200 MG/10ML IV SOSY
PREFILLED_SYRINGE | INTRAVENOUS | Status: AC
  Filled 2021-02-20: qty 10

## 2021-02-20 MED ORDER — ETOMIDATE 2 MG/ML IV SOLN: 20.0000 mg | Freq: Once | INTRAVENOUS | Status: AC

## 2021-02-20 MED ORDER — FENTANYL CITRATE PF 50 MCG/ML IJ SOSY: 100.0000 ug | PREFILLED_SYRINGE | Freq: Once | INTRAMUSCULAR | Status: DC

## 2021-02-20 MED ORDER — ROCURONIUM BROMIDE 50 MG/5ML IV SOLN: 100.0000 mg | Freq: Once | INTRAVENOUS | Status: AC

## 2021-02-20 MED ORDER — FUROSEMIDE 10 MG/ML IJ SOLN
20.0000 mg | Freq: Once | INTRAMUSCULAR | Status: AC
  Administered 2021-02-20: 20 mg via INTRAVENOUS
  Filled 2021-02-20: qty 2

## 2021-02-20 MED ORDER — FUROSEMIDE 10 MG/ML IJ SOLN
40.0000 mg | Freq: Once | INTRAMUSCULAR | Status: AC
Start: 2021-02-20 — End: 2021-02-20
  Administered 2021-02-20: 40 mg via INTRAVENOUS
  Filled 2021-02-20: qty 4

## 2021-02-20 MED ORDER — DEXAMETHASONE SODIUM PHOSPHATE 10 MG/ML IJ SOLN
8.0000 mg | Freq: Once | INTRAMUSCULAR | Status: AC
  Administered 2021-02-20: 8 mg via INTRAVENOUS
  Filled 2021-02-20: qty 1

## 2021-02-20 NOTE — Progress Notes (Signed)
Occupational Therapy Treatment Patient Details Name: Taylor [name] MRN: [id number] DOB: 1965/04/18 Today's Date: 02/20/2021   History of present illness 56 y/o female presented to ED on 12/27 for L sided paralysis, R gaze, slurred speech, and L facial droop. TNK given. S/p mechanical thrombectomy of bilateral occluded P1/PCA. MRI showed acute R PCA infarcts including infarcts in the R thalamus and R occipital lobe with mild mass effect with partial effacement of 3rd ventricle, numerous small acute infarcts in bilateral cerebellar hemispheres and few scattered small acute infarcts in bilateral parietal lobes, L occipital and L frontal lobes. Intubated 12/27. PMH: CHF, sleep apnea, type 2 DM   OT comments  No plus two available, RN okay with P/A/AAROM to bilateral upper and lower extremities.  Patient able to follow commands for A/AAROM to R hemi side.  Patient with R head turn and R gaze this date, did not make eye contact.  OT to continue efforts to progress out of bed and increase ADL participation.  Continue to recommend AIR once medically cleared.     Recommendations for follow up therapy are one component of a multi-disciplinary discharge planning process, led by the attending physician.  Recommendations may be updated based on patient status, additional functional criteria and insurance authorization.    Follow Up Recommendations  Acute inpatient rehab (3hours/day)    Assistance Recommended at Discharge Frequent or constant Supervision/Assistance  Patient can return home with the following      Equipment Recommendations  Tub/shower seat;Wheelchair cushion (measurements OT);Wheelchair (measurements OT)    Recommendations for Other Services      Precautions / Restrictions Precautions Precautions: Fall Precaution Comments: vent Restrictions Weight Bearing Restrictions: No                Extremity/Trunk Assessment Upper Extremity Assessment LUE Coordination: decreased fine  motor;decreased gross motor   Lower Extremity Assessment Lower Extremity Assessment: Defer to PT evaluation                          Cognition Arousal/Alertness: Awake/alert Behavior During Therapy: Flat affect                                              Exercises General Exercises - Upper Extremity Shoulder Flexion: PROM;AAROM;Right;Left;Supine;10 reps Shoulder Extension: PROM;AAROM Elbow Extension: PROM;AAROM;Right;Left;Supine;10 reps Wrist Flexion: PROM;AAROM;Right;Left;Supine;10 reps Wrist Extension: PROM;AROM;Right;Left;10 reps;Supine Digit Composite Flexion: PROM;10 reps;Right;Left;AROM;Supine Composite Extension: PROM;AROM;Right;Left;Supine   Shoulder Instructions       General Comments      Pertinent Vitals/ Pain       Pain Assessment: Faces Faces Pain Scale: No hurt Facial Expression: Relaxed, neutral Body Movements: Absence of movements Muscle Tension: Relaxed Compliance with ventilator (intubated pts.): Tolerating ventilator or movement Vocalization (extubated pts.): N/A CPOT Total: 0 Pain Intervention(s): Monitored during session                                                          Frequency  Min 2X/week        Progress Toward Goals  OT Goals(current goals can now be found in the care plan section)  Progress towards OT goals: Progressing toward goals  Acute  Rehab OT Goals OT Goal Formulation: Patient unable to participate in goal setting Time For Goal Achievement: 02/28/21 Potential to Achieve Goals: Fair  Plan Discharge plan remains appropriate    Co-evaluation                 AM-PAC OT "6 Clicks" Daily Activity     Outcome Measure   Help from another person eating meals?: Total Help from another person taking care of personal grooming?: Total Help from another person toileting, which includes using toliet, bedpan, or urinal?: Total Help from another person bathing  (including washing, rinsing, drying)?: Total Help from another person to put on and taking off regular upper body clothing?: Total Help from another person to put on and taking off regular lower body clothing?: Total 6 Click Score: 6    End of Session Equipment Utilized During Treatment: Oxygen  OT Visit Diagnosis: Muscle weakness (generalized) (M62.81);Other symptoms and signs involving cognitive function;Hemiplegia and hemiparesis Hemiplegia - Right/Left: Left Hemiplegia - dominant/non-dominant: Non-Dominant Hemiplegia - caused by: Cerebral infarction   Activity Tolerance Patient tolerated treatment well   Patient Left in bed;with call bell/phone within reach;with bed alarm set;with family/visitor present   Nurse Communication Mobility status;Need for lift equipment;Precautions        Time: 5361-4431 OT Time Calculation (min): 15 min  Charges: OT General Charges $OT Visit: 1 Visit OT Treatments $Therapeutic Exercise: 8-22 mins  02/20/2021  RP, OTR/L  Acute Rehabilitation Services  Office:  [phone]   Suzanna Obey 02/20/2021, 9:59 AM

## 2021-02-20 NOTE — Progress Notes (Signed)
ANTICOAGULATION CONSULT NOTE - Follow Up Consult  Pharmacy Consult for Heparin Indication:  LV thrombus, s/p CVA   No Known Allergies  Patient Measurements: Height: 5\' 1"  (154.9 cm) Weight: 66.7 kg IBW/kg (Calculated) : 47.8 Heparin Dosing Weight: 63.4 kg  Vital Signs: Temp: 97.9 F (36.6 C) (01/04 0800) Temp Source: Axillary (01/04 0800) BP: 117/80 (01/04 0830) Pulse Rate: 98 (01/04 0830)  Labs: Recent Labs    02/18/21 0627 02/19/21 0536 02/20/21 0445 02/20/21 0754  HGB 11.2* 12.8 11.9*  --   HCT 36.7 41.4 39.7  --   PLT 285 301 307  --   HEPARINUNFRC 0.36 0.47 0.35  --   CREATININE 0.48 0.55  --  0.61    Estimated Creatinine Clearance: 68.7 mL/min (by C-G formula based on SCr of 0.61 mg/dL).   Assessment: 89 YOF continuing heparin for new LV thrombus in setting of acute CVA. S/p mechanical thrombectomy and TNK. Heparin level remains therapeutic at 0.35. CBC remains WNL. Neurology following.  Goal of Therapy:  Heparin level 0.3-0.5 units/ml Monitor platelets by anticoagulation protocol: Yes Monitor for s/sx of bleeding   Plan:  Continued heparin at 1150 units/hr Daily heparin level and CBC F/u for long-term anticoagulation plan s/p extubation.  Anthony Student 02/20/2021,9:23 AM

## 2021-02-20 NOTE — Progress Notes (Incomplete)
STUDENT FOLLOW-UP NOTES - DISREGARD   CC/HPI: left-sided weakness, slurred speech, rightward gaze  PMH: HFrEF, T2DM, OSA  PLAN: Increase Heparin 1200 u/hr; F/u heparin level F/u transition to PO eliquis/xarelto ($0 copay x 2)  -Increase heparin level 1 unit/kg/hr; recheck level in 6 hours -Monitor diarrhea resolution -CTM BP, f/u midodrine d/c -Lasix 60 IV, f/u BMP -Pending CBC -SSI to resistant  Anticoag: heparin for LV thrombus in setting of acute CVA; Plts 360; Hgb 11.9 >> 10.5; SCDs 1/5 HL 0.21, HL scheduled for 1600;   ID: PNA+UTI; WBC 14.1 >> 13.7; afebrile; CBC looks ok***  Antimicrobials this admission: Cefepime 12/30 >> 1/1 Ceftriaxone 1/1 >> 1/2 Cefazolin 1/2 >> 1/5  Microbiology results: 12/27 MRSA PCR: negative 12/30 BCx: MSSA 12/30 UCx: pan-sensitive E. Coli  CV: two apical thrombi; EF 20-25%; LDL 151; TG 240 SBP goal 110-140 -- on midodrine 1/5 - pressures within goal  Endocrine: CBG 219 >> 241 >> 212; A1c 11.9 1/5 - 212 >> 186  GI/Nutrition: NPO - Cortrak; imodium x 3 doses; pantoprazole; MVI 1/5 - stools are thicker, improving; tolerating tube feeding; may get PEG tube; given loperamide this am  Neuro: s/p mechanical thrombectomy, TNK, stent, RASS 0 Nephro: SCr 0.61; UOP 1.8L with Lasix; K 5.4 >> 3.8; Alk phos 165; bethanecol ends 1/5  Pulm: re-intubated 1/4; FiO2 40%;  1/5 may need trach  Heme/Onc:  PTA Med Issues: ASA, Jardiance, Metformin, Glimepiride, Levemir, Lisinopril, Coreg, Lasix, Potassium Best Practices:

## 2021-02-20 NOTE — Procedures (Signed)
Intubation Procedure Note  Taylor [name]  TA:[id number]  [birth year]  Date:02/20/21  Time:12:44 PM   Provider Performing:Catalino Plascencia [name]    Procedure: Intubation ([id number])  Indication(s) Respiratory Failure  Consent Unable to obtain consent due to emergent nature of procedure.   Anesthesia Etomidate, Versed, and Rocuronium   Time Out Verified patient identification, verified procedure, site/side was marked, verified correct patient position, special equipment/implants available, medications/allergies/relevant history reviewed, required imaging and test results available.   Sterile Technique Usual hand hygeine, masks, and gloves were used   Procedure Description Patient positioned in bed supine.  Sedation given as noted above.  Patient was intubated with endotracheal tube using Glidescope.  View was Grade 1 full glottis .  Number of attempts was 1.  Colorimetric CO2 detector was consistent with tracheal placement.   Complications/Tolerance None; patient tolerated the procedure well. Chest X-ray is ordered to verify placement.   EBL Minimal   Specimen(s) None

## 2021-02-20 NOTE — Progress Notes (Addendum)
STROKE TEAM PROGRESS NOTE   Patient is seen in her room with no family at the bedside.  She is alert and will communicate by nodding.  She has been hemodynamically stable, and her neurological exam remains stable.  She continues to have a lot of secretions, but is weaning on pressure support 5/5. Attempt to extubate today is CCM believes she is ready.   Update: Patient extubated at 1145 and then reintubated at 1244 due to increased work of breathing, desaturations, and upper airway stridor  Vital signs stable. Vitals:   02/20/21 0804 02/20/21 0805 02/20/21 0815 02/20/21 0830  BP: 107/75   117/80  Pulse: 95 95 97 98  Resp: 18 18 (!) 26 (!) 28  Temp:      TempSrc:      SpO2: 98% 98% 98% 98%  Weight:      Height:       CBC:  Recent Labs  Lab 02/19/21 0536 02/20/21 0445  WBC 11.7* 14.1*  HGB 12.8 11.9*  HCT 41.4 39.7  MCV 92.4 94.7  PLT 301 [id number]    Basic Metabolic Panel:  Recent Labs  Lab 02/14/21 0105 02/14/21 1459 02/15/21 1636 02/19/21 0536 02/20/21 0754  NA 134*  --    < > 136 137  K 4.1  --    < > 4.4 5.4*  CL 105  --    < > 100 99  CO2 20*  --    < > 28 31  GLUCOSE 97  --    < > 129* 131*  BUN 8  --    < > 12 15  CREATININE 0.53  --    < > 0.55 0.61  CALCIUM 8.0*  --    < > 8.1* 8.5*  MG 2.0 2.1  --   --   --   PHOS 3.9 4.1  --   --   --    < > = values in this interval not displayed.    Lipid Panel:  No results for input(s): CHOL, TRIG, HDL, CHOLHDL, VLDL, LDLCALC in the last 168 hours.  HgbA1c:  No results for input(s): HGBA1C in the last 168 hours.  Urine Drug Screen: No results for input(s): LABOPIA, COCAINSCRNUR, LABBENZ, AMPHETMU, THCU, LABBARB in the last 168 hours.  Alcohol Level  No results for input(s): ETH in the last 168 hours.   IMAGING past 24 hours No results found.  PHYSICAL EXAM  Temp:  [97.9 F (36.6 C)-99.5 F (37.5 C)] 97.9 F (36.6 C) (01/04 0800) Pulse Rate:  [88-106] 98 (01/04 0830) Resp:  [0-32] 28 (01/04 0830) BP:  (91-117)/(63-86) 117/80 (01/04 0830) SpO2:  [92 %-100 %] 98 % (01/04 0830) FiO2 (%):  [30 %] 30 % (01/04 0805) Weight:  [65.1 kg-66.7 kg] 66.7 kg (01/04 0500)  General -mildly obese [name], intubated off sedation.   Cardiovascular - Regular rate and rhythm. Lungs clear to auscultation Neurological Exam- intubated off sedation, awake alert follows most commands.  Right gaze preference unable to look to the left past midline.  PERRL, horizontal nystagmus present, right gaze preference.  Patient does not blink to threat on left side.  She is able to move all four extremities in response to commands with full strength in RUE and RLE and 1-2/5 strength in LUE and LLE.    ASSESSMENT/PLAN [name] is a 56 y.o. female with history of CHF, T2DM and OSA presenting with acute onset left-sided weakness, right gaze deviation and slurred  speech. She was found to have occlusion of the distal basilar artery, right P1 and proximal right P2.  She was given TNK and received mechanical thrombectomy to the right and left PCA with stenting on the left.  She remains intubated, but is on pressure support 5/5 at 30%.  She was found to have two apical thrombi on her 2D echocardiogram and remains on IV heparin. Extubated and then emergently reintubated on 02/20/2021.  Stroke:  embolic shower with b/l PCA occlusion s/p IR with TICI3 and left PCA stenting, likely secondary due to embolism from low EF and apical thrombi Code Stroke CT head No acute abnormality.  ASPECTS 10.    CTA head & neck nonopacification of distal basilar artery, right P1 and P2, poor opacification of right vertebral artery, focal stenosis in right A1 and A3 IR with b/l P1 occlusion s/p thrombectomy with TICI3 and left PCA stent MRI  Acute right PCA territory infarct, in right thalamus and occipital lobe, small acute infarcts in bilateral cerebellar hemispheres with scattered small acute infarcts in bilateral parietal  lobes, left occipital and left frontal lobes, area of susceptibility infarct in right occipital lobe suggestive of petechial hemorrhage MRA thrombectomy and placement of left P1/P2 stent with good flow related signal in distal left PCA, poor flow signal in distal basilar tip, poor opacification of right vertebral artery, right A1 and A3 ACA stenosis 2D Echo global hypokinesis with inferior akinesis, EF 0000000, grade 3 diastolic dysfunction, thrombi in inferoapical and anteroapical areas, no atrial level shunt LDL 151 HgbA1c 11.9 VTE prophylaxis -Heparin IV aspirin 81 mg daily prior to admission, Now on heparin IV and Brilinta.  Transition to PO AC when extubated. 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 Sensitivity pending On cefepime Management per CCM  Hyperlipidemia Home meds:  atorvastatin 40 mg daily, resumed in hospital LDL 151, goal < 70 Increase to 80 mg atorvastatin daily  Continue statin at discharge  Diabetes type II Uncontrolled Home meds:  Jardiance 25 mg daily, levemir 17 units BID, metformin 1000 mg BID HgbA1c 11.9, goal < 7.0 CBGs Diabetes coordinator consult SSI  Other Stroke Risk Factors Former cigarette smoker Obstructive sleep apnea  Other Active Problems   Hospital day # 8  Patient seen and examined by NP/APP with MD. MD to update note as needed.   Janine Ores, DNP, FNP-BC [facility] Pager: [phone]  I  have personally obtained history,examined this patient, reviewed notes, independently viewed imaging studies, participated in medical decision making and plan of care.ROS completed by me personally and pertinent positives fully documented  I have made any additions or clarifications directly to the above note. Agree with note above.  Patient's neurological exam remains unchanged however she remains intubated for respiratory failure and unfortunately failed a trial of extubation today and will need prolonged ventilatory support.  No family available at the bedside for discussion.  Discussed with Dr. Erin Fulling critical care medicine..This patient is critically ill and at significant risk of neurological worsening, death and  care requires constant monitoring of vital signs, hemodynamics,respiratory and cardiac monitoring, extensive review of multiple databases, frequent neurological assessment, discussion with family, other specialists and medical decision making of high complexity.I have made any additions or clarifications directly to the above note.This critical care time does not reflect procedure time, or teaching time or supervisory time of PA/NP/Med Resident etc but could involve care discussion time.  I spent 30 minutes of neurocritical care time  in the care of  this patient.       Antony Contras, MD Medical Director [facility] Pager: [phone] 02/20/2021 4:36 PM    To contact Stroke Continuity provider, please refer to [email or web address] After hours, contact General Neurology

## 2021-02-20 NOTE — Progress Notes (Signed)
PCCM Update:  Patient with increasing work of breathing and oxygen desaturations along with tracheal edema noted with upper airway stridor.   Discussed with family at the bedside to re-intubate.   Melody Comas, MD [facility] Office: [phone]   See Amion for personal pager PCCM on call pager [phone] until 7pm. Please call Elink 7p-7a. [phone]

## 2021-02-20 NOTE — Progress Notes (Signed)
NAME:  Taylor [name], MRN:  [id number], DOB:  January 08, 1966, LOS: 8 ADMISSION DATE:  01/22/2021, CONSULTATION DATE:  12/27 REFERRING MD:  Dr Tommi Rumps Melchor Amour , CHIEF COMPLAINT:   CVA  History of Present Illness:  56 year old female with PMH as below, which is significant for HFrEF with LVEF 20-25 % (2017), DM, and OSA on CPAP. 12/27 she presented to [facility] ED with complaints of acute onset left sided weakness and slurred speech. EMS noted non-verbal and right gaze preference. Imaging consistent with occlusion of the distal basilar arterty, right P1, and proximal R P2. She was given systemic TNK and mechanical thrombectomy  with stent retriever on the right with complete recanalization after one pass (TICI3). PCCM consulted for vent management and ICU care.    Pertinent  Medical History   has a past medical history of CHF (congestive heart failure) (HCC), Diabetes mellitus without complication (HCC), and Obstructive sleep apnea.   Significant Hospital Events: Including procedures, antibiotic start and stop dates in addition to other pertinent events   12/27 admit for posterior circulation stroke. TNK given and IR throbmectomy 12/28 echo with LV thrombus, EF 20-25%.  Started on heparin 12/30 failing weaning trials.  Started on cefepime for positive UA 1/1 On PSV weans  Interim History / Subjective:   Diarrhea has improved with imodium.   Tolerating PSV 5/5 this AM.   Objective   Blood pressure 108/79, pulse 91, temperature (P) 98.6 F (37 C), temperature source (P) Oral, resp. rate 18, height 5\' 1"  (1.549 m), weight 66.7 kg, SpO2 97 %.    Vent Mode: PRVC FiO2 (%):  [30 %] 30 % Set Rate:  [18 bmp] 18 bmp Vt Set:  [380 mL] 380 mL PEEP:  [5 cmH20] 5 cmH20 Pressure Support:  [12 cmH20] 12 cmH20 Plateau Pressure:  [17 cmH20-18 cmH20] 18 cmH20   Intake/Output Summary (Last 24 hours) at 02/20/2021 0742 Last data filed at 02/20/2021 0200 Gross per 24 hour  Intake 1263.11 ml  Output  1275 ml  Net -11.89 ml   Filed Weights   02/19/21 0500 02/19/21 1700 02/20/21 0500  Weight: 68.1 kg 65.1 kg 66.7 kg    Examination: Gen:      No acute distress, intubated HEENT:  EOMI, sclera anicteric Neck:     No masses; no thyromegaly, ETT Lungs:    Clear to auscultation bilaterally, no wheezing CV:         Regular rate and rhythm; no murmurs Abd:      + bowel sounds; soft, non-tender; no palpable masses, no distension Ext:    No edema; adequate peripheral perfusion Skin:      Warm and dry; no rash Neuro: Awake, responsive  Resolved Hospital Problem list     Assessment & Plan:  CVA: basilar, bilateral PI/PCA.  TNK given in ED and she is s/p mechanical thrombectomy on the right and mechanical thrombectomy with stent placement on the L.  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  Consider changing to DOAC Continue midodrine Lasix 40mg  this AM  E. coli UTI, MSSA pneumonia Continue cefazolin  DM SSI coverage Holding home glimepiride, levemir, metformin  Best  Practice (right click and "Reselect all SmartList Selections" daily)   Diet/type: tubefeeds DVT prophylaxis: systemic heparin GI prophylaxis: PPI Lines: N/A Foley:  Yes, and it is no longer needed and removal ordered  Code Status:  full code Last date of multidisciplinary goals of care discussion [ ]   Critical care time:    The patient is critically ill with multiple organ system failure and requires high complexity decision making for assessment and support, frequent evaluation and titration of therapies, advanced monitoring, review  of radiographic studies and interpretation of complex data.   Critical Care Time devoted to patient care services, exclusive of separately billable procedures, described in this note is 45 minutes.   , MD [facility] Office: [phone]   See Amion for personal pager PCCM on call pager [phone] until 7pm. Please call Elink 7p-7a. [phone]   ,

## 2021-02-20 NOTE — Procedures (Signed)
Extubation Procedure Note  Patient Details:   Name: SHANEIKA ROSSA DOB: [birth year] MRN: [id number]   Airway Documentation:    Vent end date: 02/20/21 Vent end time: 1145   Evaluation  O2 sats: stable throughout Complications: No apparent complications Patient did tolerate procedure well. Bilateral Breath Sounds: Rhonchi, Diminished   No  Patient extubated per order to HHFNC 40L/40%. CCM MD notified that no cuff leak was appreciated before extubation. RN and RT instructed to proceed with extubation to HFNC. After extubation audible stridor noted. Racemic epi and decadron ordered by CCM MD. RT gave racemic with some improvement noted. Patient also has copious oral secretions. Vitals are currently stable with sats of 99%. RT will continue to monitor and await further orders.   Leona Pressly Lajuana Ripple 02/20/2021, 12:21 PM

## 2021-02-21 DIAGNOSIS — J9601 Acute respiratory failure with hypoxia: Secondary | ICD-10-CM

## 2021-02-21 DIAGNOSIS — I639 Cerebral infarction, unspecified: Secondary | ICD-10-CM | POA: Diagnosis not present

## 2021-02-21 DIAGNOSIS — E1159 Type 2 diabetes mellitus with other circulatory complications: Secondary | ICD-10-CM

## 2021-02-21 DIAGNOSIS — I5020 Unspecified systolic (congestive) heart failure: Secondary | ICD-10-CM | POA: Diagnosis not present

## 2021-02-21 DIAGNOSIS — G4733 Obstructive sleep apnea (adult) (pediatric): Secondary | ICD-10-CM | POA: Diagnosis not present

## 2021-02-21 LAB — CBC
HCT: 41 % (ref 36.0–46.0)
Hemoglobin: 12.8 g/dL (ref 12.0–15.0)
MCH: 28.6 pg (ref 26.0–34.0)
MCHC: 31.2 g/dL (ref 30.0–36.0)
MCV: 91.5 fL (ref 80.0–100.0)
Platelets: 360 10*3/uL (ref 150–400)
RBC: 4.48 MIL/uL (ref 3.87–5.11)
RDW: 16.4 % — ABNORMAL HIGH (ref 11.5–15.5)
WBC: 13.7 10*3/uL — ABNORMAL HIGH (ref 4.0–10.5)
nRBC: 0.1 % (ref 0.0–0.2)

## 2021-02-21 LAB — BASIC METABOLIC PANEL
Anion gap: 9 (ref 5–15)
BUN: 16 mg/dL (ref 6–20)
CO2: 31 mmol/L (ref 22–32)
Calcium: 8.5 mg/dL — ABNORMAL LOW (ref 8.9–10.3)
Chloride: 100 mmol/L (ref 98–111)
Creatinine, Ser: 0.66 mg/dL (ref 0.44–1.00)
GFR, Estimated: >60 mL/min (ref >60)
Glucose, Bld: 221 mg/dL — ABNORMAL HIGH (ref 70–99)
Potassium: 4.3 mmol/L (ref 3.5–5.1)
Sodium: 140 mmol/L (ref 135–145)

## 2021-02-21 LAB — GLUCOSE, CAPILLARY
Glucose-Capillary: 241 mg/dL — ABNORMAL HIGH (ref 70–99)
Glucose-Capillary: 212 mg/dL — ABNORMAL HIGH (ref 70–99)
Glucose-Capillary: 186 mg/dL — ABNORMAL HIGH (ref 70–99)
Glucose-Capillary: 166 mg/dL — ABNORMAL HIGH (ref 70–99)
Glucose-Capillary: 238 mg/dL — ABNORMAL HIGH (ref 70–99)
Glucose-Capillary: 168 mg/dL — ABNORMAL HIGH (ref 70–99)

## 2021-02-21 LAB — HEPARIN LEVEL (UNFRACTIONATED)
Heparin Unfractionated: 0.21 IU/mL — ABNORMAL LOW (ref 0.30–0.70)
Heparin Unfractionated: 0.37 IU/mL (ref 0.30–0.70)

## 2021-02-21 MED ORDER — FUROSEMIDE 10 MG/ML IJ SOLN
40.0000 mg | Freq: Once | INTRAMUSCULAR | Status: AC
  Administered 2021-02-21: 40 mg via INTRAVENOUS
  Filled 2021-02-21: qty 4

## 2021-02-21 MED ORDER — INSULIN ASPART 100 UNIT/ML IJ SOLN
0.0000 [IU] | INTRAMUSCULAR | Status: DC
  Administered 2021-02-21: 3 [IU] via SUBCUTANEOUS
  Administered 2021-02-21: 7 [IU] via SUBCUTANEOUS
  Administered 2021-02-21: 4 [IU] via SUBCUTANEOUS
  Administered 2021-02-21 – 2021-02-22 (×2): 3 [IU] via SUBCUTANEOUS
  Administered 2021-02-22 (×2): 7 [IU] via SUBCUTANEOUS
  Administered 2021-02-22 – 2021-02-23 (×3): 4 [IU] via SUBCUTANEOUS
  Administered 2021-02-23: 11 [IU] via SUBCUTANEOUS
  Administered 2021-02-23 (×2): 7 [IU] via SUBCUTANEOUS
  Administered 2021-02-23: 11 [IU] via SUBCUTANEOUS
  Administered 2021-02-23: 4 [IU] via SUBCUTANEOUS
  Administered 2021-02-23: 11 [IU] via SUBCUTANEOUS
  Administered 2021-02-24 (×2): 3 [IU] via SUBCUTANEOUS
  Administered 2021-02-24: 20 [IU] via SUBCUTANEOUS
  Administered 2021-02-24: 7 [IU] via SUBCUTANEOUS
  Administered 2021-02-24: 3 [IU] via SUBCUTANEOUS
  Administered 2021-02-25: 4 [IU] via SUBCUTANEOUS
  Administered 2021-02-25: 7 [IU] via SUBCUTANEOUS
  Administered 2021-02-25: 4 [IU] via SUBCUTANEOUS
  Administered 2021-02-25 (×2): 7 [IU] via SUBCUTANEOUS
  Administered 2021-02-25 – 2021-02-26 (×2): 4 [IU] via SUBCUTANEOUS
  Administered 2021-02-26: 3 [IU] via SUBCUTANEOUS
  Administered 2021-02-26 (×2): 7 [IU] via SUBCUTANEOUS
  Administered 2021-02-26 – 2021-02-27 (×4): 4 [IU] via SUBCUTANEOUS
  Administered 2021-02-27: 7 [IU] via SUBCUTANEOUS
  Administered 2021-02-27 – 2021-02-28 (×4): 4 [IU] via SUBCUTANEOUS
  Administered 2021-02-28: 3 [IU] via SUBCUTANEOUS
  Administered 2021-02-28: 4 [IU] via SUBCUTANEOUS
  Administered 2021-02-28: 3 [IU] via SUBCUTANEOUS
  Administered 2021-03-01 (×3): 4 [IU] via SUBCUTANEOUS
  Administered 2021-03-01: 3 [IU] via SUBCUTANEOUS
  Administered 2021-03-01 – 2021-03-02 (×2): 7 [IU] via SUBCUTANEOUS
  Administered 2021-03-02: 3 [IU] via SUBCUTANEOUS
  Administered 2021-03-02: 7 [IU] via SUBCUTANEOUS
  Administered 2021-03-02 – 2021-03-03 (×4): 4 [IU] via SUBCUTANEOUS
  Administered 2021-03-03 (×2): 7 [IU] via SUBCUTANEOUS
  Administered 2021-03-03: 4 [IU] via SUBCUTANEOUS
  Administered 2021-03-03: 3 [IU] via SUBCUTANEOUS
  Administered 2021-03-03 (×2): 4 [IU] via SUBCUTANEOUS
  Administered 2021-03-04 (×3): 3 [IU] via SUBCUTANEOUS
  Administered 2021-03-05 (×3): 4 [IU] via SUBCUTANEOUS
  Administered 2021-03-06: 3 [IU] via SUBCUTANEOUS
  Administered 2021-03-06: 4 [IU] via SUBCUTANEOUS
  Administered 2021-03-06 – 2021-03-07 (×4): 3 [IU] via SUBCUTANEOUS
  Administered 2021-03-07: 4 [IU] via SUBCUTANEOUS
  Administered 2021-03-07: 3 [IU] via SUBCUTANEOUS
  Administered 2021-03-08 (×2): 4 [IU] via SUBCUTANEOUS
  Administered 2021-03-08: 3 [IU] via SUBCUTANEOUS

## 2021-02-21 NOTE — Progress Notes (Addendum)
Occupational Therapy Treatment Patient Details Name: Taylor [name] MRN: [id number] DOB: 06/02/1965 Today's Date: 02/21/2021   History of present illness 56 y/o female presented to ED on 12/27 for L sided paralysis, R gaze, slurred speech, and L facial droop. TNK given. S/p mechanical thrombectomy of bilateral occluded P1/PCA. MRI showed acute R PCA infarcts including infarcts in the R thalamus and R occipital lobe with mild mass effect with partial effacement of 3rd ventricle, numerous small acute infarcts in bilateral cerebellar hemispheres and few scattered small acute infarcts in bilateral parietal lobes, L occipital and L frontal lobes. Intubated 12/27. PMH: CHF, sleep apnea, type 2 DM   OT comments  Patient currently weaning from vent and on BiPaP.  Seen in conjunction with PT this date to encourage upright sitting, trunk control and visual scanning.  Patient continues to need +2 for basic bed mobility, and Max A of 1 to maintain upright sitting.  Patient is scanning past midline with ease, and looking left without cues.  OT to continue efforts in the acute setting, and AIR is recommended for post acute rehab.     Recommendations for follow up therapy are one component of a multi-disciplinary discharge planning process, led by the attending physician.  Recommendations may be updated based on patient status, additional functional criteria and insurance authorization.    Follow Up Recommendations  Acute inpatient rehab (3hours/day)    Assistance Recommended at Discharge Frequent or constant Supervision/Assistance  Patient can return home with the following      Equipment Recommendations  Tub/shower seat;Wheelchair cushion (measurements OT);Wheelchair (measurements OT)    Recommendations for Other Services      Precautions / Restrictions Precautions Precautions: Fall Precaution Comments: CPAP and NG tube Restrictions Weight Bearing Restrictions: No       Mobility Bed  Mobility Overal bed mobility: Needs Assistance Bed Mobility: Sit to Supine;Supine to Sit     Supine to sit: Max assist;+2 for physical assistance Sit to supine: Total assist;+2 for physical assistance        Transfers                   General transfer comment: not tested, pt remains on vent/BiPap     Balance Overall balance assessment: Needs assistance Sitting-balance support: Feet supported;No upper extremity supported Sitting balance-Leahy Scale: Poor Sitting balance - Comments: patient unable to to maintain sit balance without external support Postural control: Left lateral lean;Posterior lean     Standing balance comment: NT                           ADL either performed or assessed with clinical judgement   ADL   Eating/Feeding: NPO   Grooming: Maximal assistance   Upper Body Bathing: Total assistance   Lower Body Bathing: Total assistance   Upper Body Dressing : Total assistance   Lower Body Dressing: Total assistance                 General ADL Comments: able to use R hand to bring washcloth to face with assist    Extremity/Trunk Assessment Upper Extremity Assessment Upper Extremity Assessment: LUE deficits/detail LUE Deficits / Details: no trace noted this session LUE Sensation: decreased light touch LUE Coordination: decreased fine motor;decreased gross motor       Cervical / Trunk Assessment Cervical / Trunk Assessment: Kyphotic    Vision   Additional Comments: patient with less R gaze noted, crossing midline and looking  left without prompting.   Perception Perception Perception: Not tested   Praxis Praxis Praxis: Not tested    Cognition Arousal/Alertness: Awake/alert Behavior During Therapy: Flat affect Overall Cognitive Status: Difficult to assess                                 General Comments: consistently follows 1 step commands, began nodding head yes/no appropriately                             Pertinent Vitals/ Pain       Faces Pain Scale: Hurts a little bit Facial Expression: Relaxed, neutral Body Movements: Absence of movements Muscle Tension: Relaxed Compliance with ventilator (intubated pts.): Tolerating ventilator or movement Vocalization (extubated pts.): N/A CPOT Total: 0 Pain Location: generalized with movement Pain Descriptors / Indicators: Grimacing Pain Intervention(s): Monitored during session                                                          Frequency  Min 2X/week        Progress Toward Goals  OT Goals(current goals can now be found in the care plan section)  Progress towards OT goals: Progressing toward goals  Acute Rehab OT Goals OT Goal Formulation: Patient unable to participate in goal setting Time For Goal Achievement: 02/28/21 Potential to Achieve Goals: Fair  Plan Discharge plan remains appropriate    Co-evaluation    PT/OT/SLP Co-Evaluation/Treatment: Yes Reason for Co-Treatment: Complexity of the patient's impairments (multi-system involvement)   OT goals addressed during session: ADL's and self-care;Strengthening/ROM      AM-PAC OT "6 Clicks" Daily Activity     Outcome Measure   Help from another person eating meals?: Total Help from another person taking care of personal grooming?: A Lot Help from another person toileting, which includes using toliet, bedpan, or urinal?: Total Help from another person bathing (including washing, rinsing, drying)?: Total Help from another person to put on and taking off regular upper body clothing?: Total Help from another person to put on and taking off regular lower body clothing?: Total 6 Click Score: 7    End of Session Equipment Utilized During Treatment: Oxygen  OT Visit Diagnosis: Muscle weakness (generalized) (M62.81);Other symptoms and signs involving cognitive function;Hemiplegia and hemiparesis Hemiplegia - Right/Left:  Left Hemiplegia - dominant/non-dominant: Non-Dominant   Activity Tolerance Patient tolerated treatment well   Patient Left in bed;with call bell/phone within reach;with bed alarm set;with family/visitor present   Nurse Communication Mobility status;Need for lift equipment;Precautions        Time: 1427-1450 OT Time Calculation (min): 23 min  Charges: OT General Charges $OT Visit: 1 Visit OT Treatments $Therapeutic Activity: 8-22 mins  02/21/2021  RP, OTR/L  Acute Rehabilitation Services  Office:  [phone]   Suzanna Obey 02/21/2021, 3:02 PM

## 2021-02-21 NOTE — Progress Notes (Signed)
ANTICOAGULATION CONSULT NOTE - Follow Up Consult  Pharmacy Consult for Heparin Indication:  LV thrombus, s/p CVA   No Known Allergies  Patient Measurements: Height: 5\' 1"  (154.9 cm) Weight: 66.8 kg (147 lb 4.3 oz) IBW/kg (Calculated) : 47.8 Heparin Dosing Weight: 63.4 kg  Vital Signs: Temp: 98.2 F (36.8 C) (01/05 1600) Temp Source: Axillary (01/05 1600) BP: 121/88 (01/05 1700) Pulse Rate: 87 (01/05 1600)  Labs: Recent Labs    02/19/21 0536 02/20/21 0445 02/20/21 0754 02/20/21 1420 02/21/21 0537 02/21/21 1618  HGB 12.8 11.9*  --  13.3 12.8  --   HCT 41.4 39.7  --  39.0 41.0  --   PLT 301 307  --   --  360  --   HEPARINUNFRC 0.47 0.35  --   --  0.21* 0.37  CREATININE 0.55  --  0.61  --  0.66  --     Estimated Creatinine Clearance: 68.7 mL/min (by C-G formula based on SCr of 0.66 mg/dL).   Assessment: 22 YOF continuing heparin for new LV thrombus in setting of acute CVA. S/p mechanical thrombectomy and TNK.   The 6 hour heparin level = 0.37 is therapeutic after  heparin infusion increased to 1200 units/hr.  No bleeding reported.  This morning's CBC remained WNL/stable.  Goal of Therapy:  Heparin level 0.3-0.5 units/ml Monitor platelets by anticoagulation protocol: Yes Monitor for s/sx of bleeding   Plan:  Continue heparin drip at 1200 units/hr Monitor HL and CBC daily F/u for long-term anticoagulation plan s/p extubation.   Thank you for allowing pharmacy to be part of this patients care team.  Nicole Cella, RPh Clinical Pharmacist 02/21/2021, 5:47 PM Please check AMION for all [place] phone numbers After 10:00 PM, call [place]

## 2021-02-21 NOTE — Progress Notes (Signed)
Inpatient Diabetes Program Recommendations  AACE/ADA: New Consensus Statement on Inpatient Glycemic Control (2015)  Target Ranges:  Prepandial:   less than 140 mg/dL      Peak postprandial:   less than 180 mg/dL (1-2 hours)      Critically ill patients:  140 - 180 mg/dL   Lab Results  Component Value Date   GLUCAP 212 (H) 02/21/2021   HGBA1C 11.9 (H) 02/13/2021    Review of Glycemic Control  Diabetes history: type 2 Current orders for Inpatient glycemic control: Novolog 0-20 units every 4 hours.  Inpatient Diabetes Program Recommendations:   Noted that patient's CBGs have been >200 mg/dl especially after having Decadron. Recommend adding Novolog 2 units every 4 hours if blood sugars continue to be elevated.  Titrate dosages as needed.   Smith Mince RN BSN CDE Diabetes Coordinator Pager: [phone]  8am-5pm

## 2021-02-21 NOTE — Progress Notes (Signed)
SLP Cancellation Note  Patient Details Name: TAHEERAH NARASIMHAN MRN: [id number] DOB: [birth year]   Cancelled treatment:       Reason Eval/Treat Not Completed: Patient not medically ready. Note that pt was extubated on previous date but required reintubation. Will continue to follow.     Osie Bond., M.A. [place] Acute Rehabilitation Services Pager [phone] Office [phone]  02/21/2021, 7:55 AM

## 2021-02-21 NOTE — Progress Notes (Addendum)
NAME:  BIRGITTA UHLIR, MRN:  [id number], DOB:  06-12-1965, LOS: 9 ADMISSION DATE:  01/27/2021, CONSULTATION DATE:  12/27 REFERRING MD:  Dr Tommi Rumps Melchor Amour , CHIEF COMPLAINT:   CVA  History of Present Illness:  56 year old female with PMH as below, which is significant for HFrEF with LVEF 20-25 % (2017), DM, and OSA on CPAP. 12/27 she presented to [facility] ED with complaints of acute onset left sided weakness and slurred speech. EMS noted non-verbal and right gaze preference. Imaging consistent with occlusion of the distal basilar arterty, right P1, and proximal R P2. She was given systemic TNK and mechanical thrombectomy  with stent retriever on the right with complete recanalization after one pass (TICI3). PCCM consulted for vent management and ICU care.    Pertinent  Medical History   has a past medical history of CHF (congestive heart failure) (HCC), Diabetes mellitus without complication (HCC), and Obstructive sleep apnea.   Significant Hospital Events: Including procedures, antibiotic start and stop dates in addition to other pertinent events   12/27 admit for posterior circulation stroke. TNK given and IR throbmectomy 12/28 echo with LV thrombus, EF 20-25%.  Started on heparin 12/30 failing weaning trials.  Started on cefepime for positive UA 1/1 On PSV weans 1/4: patient extubated and developed stridor and increase wob; required reintubation; decadron and lasix given  Interim History / Subjective:  Extubated yesterday and reintubated for stridor and increase wob Possible pulm edema w/ frothy secretions Decadron and lasix given  Today intubated on PSV  Glucose elevated in 200s after receiving decadron yesterday UOP -2.1 L last 24 hours; net positive 9 L  Objective   Blood pressure 108/73, pulse 93, temperature 98.1 F (36.7 C), temperature source Axillary, resp. rate (!) 8, height 5\' 1"  (1.549 m), weight 66.8 kg, SpO2 98 %.    Vent Mode: PSV;CPAP FiO2 (%):  [40 %-100 %]  40 % Set Rate:  [18 bmp] 18 bmp Vt Set:  [380 mL] 380 mL PEEP:  [5 cmH20-8 cmH20] 5 cmH20 Pressure Support:  [10 cmH20] 10 cmH20 Plateau Pressure:  [13 cmH20-21 cmH20] 19 cmH20   Intake/Output Summary (Last 24 hours) at 02/21/2021 0930 Last data filed at 02/21/2021 0800 Gross per 24 hour  Intake 1947.11 ml  Output 2100 ml  Net -152.89 ml    Filed Weights   02/19/21 1700 02/20/21 0500 02/21/21 0500  Weight: 65.1 kg 66.7 kg 66.8 kg    Examination: General:   ill appearing on mech vent HEENT: MM pink/moist; ETT in place Neuro: Aox3; cough/gag reflex present CV: s1s2, RRR, no m/r/g PULM:  dim clear BS bilaterally; on mech vent PSV GI: soft, bsx4 active  Extremities: warm/dry, no edema  Skin: no rashes or lesions   Resolved Hospital Problem list     Assessment & Plan:  CVA: basilar, bilateral PI/PCA.  TNK given in ED and she is s/p mechanical thrombectomy on the right and mechanical thrombectomy with stent placement on the L.  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 coli UTI, MSSA pneumonia P: -Continue cefazolin  DM P: -glucose elevated after receiving decadron yesterday -increasing SSI to  resistant -cbg monitoring   Best Practice (right click and "Reselect all SmartList Selections" daily)   Diet/type: tubefeeds DVT prophylaxis: systemic heparin GI prophylaxis: PPI Lines: N/A Foley:  Yes, and it is still needed Code Status:  full code Last date of multidisciplinary goals of care discussion [1/5 udated brandon son over phone ]  Critical care time: 35 minutes   JD 08-20-1982 [facility] 02/21/2021, 10:00 AM  Please see [email or web address] for pager details.  From 7A-7P if no response, please call [phone]. After hours, please call [facility] [phone].  Please call [place] 7p-7a. [phone]   ,

## 2021-02-21 NOTE — Progress Notes (Addendum)
STROKE TEAM PROGRESS NOTE    INTERVAL HISTORY:  Emergently reintubated yesterday afternoon after only a short period of extubation for respiratory failure..  She is currently weaning 40% 5/5.  CCM plans to diurese her over the weekend as well as give her steroids for vocal cord edema that they noticed when they reintubated her.  Patient is seen in her room with no family at the bedside.  She is alert and will communicate by nodding.  She has been hemodynamically stable, and her neurological exam remains stable.    Vital signs stable. Vitals:   02/21/21 0700 02/21/21 0749 02/21/21 0750 02/21/21 0800  BP: 109/74   108/73  Pulse: 90 91  93  Resp: 10 19  (!) 8  Temp:    98.1 F (36.7 C)  TempSrc:    Axillary  SpO2: 99% 99% 98% 98%  Weight:      Height:       CBC:  Recent Labs  Lab 02/19/21 0536 02/20/21 0445 02/20/21 1420  WBC 11.7* 14.1*  --   HGB 12.8 11.9* 13.3  HCT 41.4 39.7 39.0  MCV 92.4 94.7  --   PLT 301 307  --     Basic Metabolic Panel:  Recent Labs  Lab 02/14/21 1459 02/15/21 1636 02/20/21 0754 02/20/21 1420 02/21/21 0537  NA  --    < > 137 139 140  K  --    < > 5.4* 3.8 4.3  CL  --    < > 99  --  100  CO2  --    < > 31  --  31  GLUCOSE  --    < > 131*  --  221*  BUN  --    < > 15  --  16  CREATININE  --    < > 0.61  --  0.66  CALCIUM  --    < > 8.5*  --  8.5*  MG 2.1  --   --   --   --   PHOS 4.1  --   --   --   --    < > = values in this interval not displayed.    Lipid Panel:  No results for input(s): CHOL, TRIG, HDL, CHOLHDL, VLDL, LDLCALC in the last 168 hours.  HgbA1c:  No results for input(s): HGBA1C in the last 168 hours.  Urine Drug Screen: No results for input(s): LABOPIA, COCAINSCRNUR, LABBENZ, AMPHETMU, THCU, LABBARB in the last 168 hours.  Alcohol Level  No results for input(s): ETH in the last 168 hours.   IMAGING past 24 hours DG CHEST PORT 1 VIEW  Result Date: 02/20/2021 CLINICAL DATA:  Endotracheal tube placement. EXAM:  PORTABLE CHEST 1 VIEW COMPARISON:  Same day. FINDINGS: Endotracheal and feeding tubes are in grossly good position. Mild cardiomegaly is noted. Stable bibasilar atelectasis or infiltrates are noted. Probable small left pleural effusion. Bony thorax is unremarkable. IMPRESSION: Stable support apparatus.  Stable bibasilar opacities. Electronically Signed   By: Marijo Conception M.D.   On: 02/20/2021 13:46   DG CHEST PORT 1 VIEW  Result Date: 02/20/2021 CLINICAL DATA:  Respiratory failure EXAM: PORTABLE CHEST 1 VIEW COMPARISON:  02/15/2021 FINDINGS: Endotracheal tube in good position. Feeding tube enters the stomach with the tip not visualized. Mild progression of bibasilar airspace disease. Probable small effusions. Negative for heart failure or edema. IMPRESSION: Endotracheal tube in good position. Progression of bibasilar atelectasis/pneumonia. Electronically Signed   By: Franchot Gallo M.D.  On: 02/20/2021 09:58    PHYSICAL EXAM  Temp:  [95.5 F (35.3 C)-99.6 F (37.6 C)] 98.1 F (36.7 C) (01/05 0800) Pulse Rate:  [82-122] 93 (01/05 0800) Resp:  [0-31] 8 (01/05 0800) BP: (107-127)/(65-94) 108/73 (01/05 0800) SpO2:  [96 %-100 %] 98 % (01/05 0800) FiO2 (%):  [30 %-100 %] 40 % (01/05 0750) Weight:  [66.8 kg] 66.8 kg (01/05 0500)  General -mildly obese [name], intubated off sedation.  Cardiovascular - Regular rate and rhythm. Lungs clear to auscultation Neurological Exam- intubated off sedation, awake alert follows most commands.  Right gaze preference unable to look to the left past midline.  PERRL, horizontal nystagmus present, right gaze preference.  Patient does not blink to threat on left side.  She is able to move all four extremities in response to commands with full strength in RUE and RLE and 1-2/5 strength in LUE and LLE. Hemiplegia on the left   ASSESSMENT/PLAN Ms. ELECIA REHAGEN is a 56 y.o. female with history of CHF, T2DM and OSA presenting with acute  onset left-sided weakness, right gaze deviation and slurred speech. She was found to have occlusion of the distal basilar artery, right P1 and proximal right P2.  She was given TNK and received mechanical thrombectomy to the right and left PCA with stenting on the left. She was found to have two apical thrombi on her 2D echocardiogram and remains on IV heparin. Extubated and then emergently reintubated on 02/20/2021.  She is currently on pressure support 5/5 with an FiO2 40%.  CCM plans to diurese her and give her steroids in hopes of extubating over the weekend.  Stroke:  embolic shower with b/l PCA occlusion s/p IR with TICI3 and left PCA stenting, likely secondary due to embolism from low EF and apical thrombi Code Stroke CT head No acute abnormality.  ASPECTS 10.    CTA head & neck nonopacification of distal basilar artery, right P1 and P2, poor opacification of right vertebral artery, focal stenosis in right A1 and A3 IR with b/l P1 occlusion s/p thrombectomy with TICI3 and left PCA stent MRI  Acute right PCA territory infarct, in right thalamus and occipital lobe, small acute infarcts in bilateral cerebellar hemispheres with scattered small acute infarcts in bilateral parietal lobes, left occipital and left frontal lobes, area of susceptibility infarct in right occipital lobe suggestive of petechial hemorrhage MRA thrombectomy and placement of left P1/P2 stent with good flow related signal in distal left PCA, poor flow signal in distal basilar tip, poor opacification of right vertebral artery, right A1 and A3 ACA stenosis 2D Echo global hypokinesis with inferior akinesis, EF 0000000, grade 3 diastolic dysfunction, thrombi in inferoapical and anteroapical areas, no atrial level shunt LDL 151 HgbA1c 11.9 VTE prophylaxis -Heparin IV aspirin 81 mg daily prior to admission, Now on heparin IV and Brilinta.  Transition to PO AC when extubated. 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 Sensitivity pending On cefepime Management per CCM  Hyperlipidemia Home meds:  atorvastatin 40 mg daily, resumed in hospital LDL 151, goal < 70 Increase to 80 mg atorvastatin daily  Continue statin at discharge  Diabetes type II Uncontrolled Home meds:  Jardiance 25 mg daily, levemir 17 units BID, metformin 1000 mg BID HgbA1c 11.9, goal < 7.0 CBGs Diabetes coordinator consult SSI  Other Stroke Risk Factors Former cigarette smoker Obstructive sleep apnea  Other Active Problems   Hospital day # 9  Patient seen and examined by NP/APP with MD. MD to update note as needed.   Janine Ores, DNP, FNP-BC [facility] Pager: [phone]  I have personally obtained history,examined this patient, reviewed notes, independently viewed imaging studies, participated in medical decision making and plan of care.ROS completed by me personally and pertinent positives fully documented  I have made any additions or clarifications directly to the above note. Agree with note above.  Patient unfortunately did not tolerate extubation and was reintubated for airway protection.  Continue ventilatory support for respiratory failure as per critical care team and possibly repeat trial of extubation in a few days otherwise she may need tracheostomy later if prolonged ventilatory support is needed.  No need neurological recommendation at this time.This patient is critically ill and  at significant risk of neurological worsening, death and care requires constant monitoring of vital signs, hemodynamics,respiratory and cardiac monitoring, extensive review of multiple databases, frequent neurological assessment, discussion with family, other specialists and medical decision making of high complexity.I have made any additions or clarifications directly to the above note.This critical care time does not reflect procedure time, or teaching time or supervisory time of PA/NP/Med Resident etc but could involve care discussion time.  I spent 30 minutes of neurocritical care time  in the care of  this patient.      Antony Contras, MD Medical Director [facility] Pager: [phone] 02/21/2021 2:56 PM     To contact Stroke Continuity provider, please refer to [email or web address] After hours, contact General Neurology

## 2021-02-21 NOTE — Progress Notes (Signed)
Physical Therapy Treatment Patient Details Name: Taylor [name] MRN: [id number] DOB: [birth year] Today's Date: 02/21/2021   History of Present Illness 56 y/o female presented to ED on 12/27 for L sided paralysis, R gaze, slurred speech, and L facial droop. TNK given. S/p mechanical thrombectomy of bilateral occluded P1/PCA. MRI showed acute R PCA infarcts including infarcts in the R thalamus and R occipital lobe with mild mass effect with partial effacement of 3rd ventricle, numerous small acute infarcts in bilateral cerebellar hemispheres and few scattered small acute infarcts in bilateral parietal lobes, L occipital and L frontal lobes. Intubated 12/27. PMH: CHF, sleep apnea, type 2 DM    PT Comments    Patient currently weaning on vent on CPAP settings. Patient requires +2 assist for bed mobility. Able to tolerate sitting EOB x 10 minutes with maxA to maintain sitting balance due to L lateral lean. Patient pushing with R UE at times and benefits from handhold to avoid pushing. Patient following commands for seated exercises to promote LE strengthening. Continue to recommend CIR level therapies to assist with maximizing functional mobility and safety.     Recommendations for follow up therapy are one component of a multi-disciplinary discharge planning process, led by the attending physician.  Recommendations may be updated based on patient status, additional functional criteria and insurance authorization.  Follow Up Recommendations  Acute inpatient rehab (3hours/day)     Assistance Recommended at Discharge Frequent or constant Supervision/Assistance  Patient can return home with the following Two people to help with bathing/dressing/bathroom;Two people to help with walking and/or transfers;Assistance with cooking/housework;Assistance with feeding;Direct supervision/assist for medications management;Direct supervision/assist for financial management   Equipment Recommendations  Other (comment)  (TBD)    Recommendations for Other Services       Precautions / Restrictions Precautions Precautions: Fall Precaution Comments: vent and cortrak Restrictions Weight Bearing Restrictions: No     Mobility  Bed Mobility Overal bed mobility: Needs Assistance Bed Mobility: Sit to Supine;Supine to Sit     Supine to sit: Max assist;+2 for physical assistance;+2 for safety/equipment Sit to supine: Total assist;+2 for physical assistance;+2 for safety/equipment   General bed mobility comments: max verbal cues for sequencing. Patient able to move R LE towards EOB    Transfers                   General transfer comment: not tested, pt remains on vent/BiPap    Ambulation/Gait                   Stairs             Wheelchair Mobility    Modified Rankin (Stroke Patients Only) Modified Rankin (Stroke Patients Only) Pre-Morbid Rankin Score: No significant disability Modified Rankin: Severe disability     Balance Overall balance assessment: Needs assistance Sitting-balance support: Feet supported;No upper extremity supported Sitting balance-Leahy Scale: Poor Sitting balance - Comments: patient unable to to maintain sit balance without external support. Pushing with R UE at times Postural control: Left lateral lean;Posterior lean     Standing balance comment: NT                            Cognition Arousal/Alertness: Awake/alert Behavior During Therapy: Flat affect Overall Cognitive Status: Difficult to assess  General Comments: consistently follows 1 step commands, began nodding head yes/no appropriately        Exercises General Exercises - Lower Extremity Ankle Circles/Pumps: AROM;Both;5 reps;Seated Long Arc Quad: AROM;Right;AAROM;Left;5 reps;Seated    General Comments        Pertinent Vitals/Pain Pain Assessment: CPOT Faces Pain Scale: Hurts a little bit Facial Expression:  Relaxed, neutral Body Movements: Absence of movements Muscle Tension: Relaxed Compliance with ventilator (intubated pts.): Tolerating ventilator or movement Vocalization (extubated pts.): N/A CPOT Total: 0 Pain Location: generalized with movement Pain Descriptors / Indicators: Grimacing Pain Intervention(s): Monitored during session    Home Living                          Prior Function            PT Goals (current goals can now be found in the care plan section) Acute Rehab PT Goals Patient Stated Goal: unable to state PT Goal Formulation: Patient unable to participate in goal setting Time For Goal Achievement: 02/28/21 Potential to Achieve Goals: Fair Progress towards PT goals: Progressing toward goals    Frequency    Min 3X/week      PT Plan Current plan remains appropriate    Co-evaluation PT/OT/SLP Co-Evaluation/Treatment: Yes Reason for Co-Treatment: Complexity of the patient's impairments (multi-system involvement);For patient/therapist safety PT goals addressed during session: Mobility/safety with mobility;Strengthening/ROM OT goals addressed during session: ADL's and self-care;Strengthening/ROM      AM-PAC PT "6 Clicks" Mobility   Outcome Measure  Help needed turning from your back to your side while in a flat bed without using bedrails?: Total Help needed moving from lying on your back to sitting on the side of a flat bed without using bedrails?: Total Help needed moving to and from a bed to a chair (including a wheelchair)?: Total Help needed standing up from a chair using your arms (e.g., wheelchair or bedside chair)?: Total Help needed to walk in hospital room?: Total Help needed climbing 3-5 steps with a railing? : Total 6 Click Score: 6    End of Session Equipment Utilized During Treatment: Oxygen Activity Tolerance: Patient tolerated treatment well Patient left: in bed;with call bell/phone within reach Nurse Communication: Mobility  status PT Visit Diagnosis: Unsteadiness on feet (R26.81);Muscle weakness (generalized) (M62.81);Difficulty in walking, not elsewhere classified (R26.2);Other symptoms and signs involving the nervous system [id number])     Time: [id number] PT Time Calculation (min) (ACUTE ONLY): 23 min  Charges:  $Therapeutic Activity: 8-22 mins                     Brookelin Felber A. Gilford Rile PT, DPT Acute Rehabilitation Services Pager [phone] Office [phone]    Linna Hoff 02/21/2021, 3:10 PM

## 2021-02-21 NOTE — Progress Notes (Addendum)
ANTICOAGULATION CONSULT NOTE - Follow Up Consult  Pharmacy Consult for Heparin Indication:  LV thrombus, s/p CVA   No Known Allergies  Patient Measurements: Height: 5\' 1"  (154.9 cm) Weight: 66.8 kg (147 lb 4.3 oz) IBW/kg (Calculated) : 47.8 Heparin Dosing Weight: 63.4 kg  Vital Signs: Temp: 98.9 F (37.2 C) (01/05 0400) Temp Source: Oral (01/05 0400) BP: 109/74 (01/05 0700) Pulse Rate: 90 (01/05 0700)  Labs: Recent Labs    02/19/21 0536 02/20/21 0445 02/20/21 0754 02/20/21 1420 02/21/21 0537  HGB 12.8 11.9*  --  13.3  --   HCT 41.4 39.7  --  39.0  --   PLT 301 307  --   --   --   HEPARINUNFRC 0.47 0.35  --   --  0.21*  CREATININE 0.55  --  0.61  --   --     Estimated Creatinine Clearance: 68.7 mL/min (by C-G formula based on SCr of 0.61 mg/dL).   Assessment: 55 YOF continuing heparin for new LV thrombus in setting of acute CVA. S/p mechanical thrombectomy and TNK. Heparin level subtherapeutic at 0.21 units/mL; no issue with heparin infusion per RN . CBC remains WNL.   Goal of Therapy:  Heparin level 0.3-0.5 units/ml Monitor platelets by anticoagulation protocol: Yes Monitor for s/sx of bleeding   Plan:  Increase heparin to 1200 units/hr Recheck heparin level in 6 hours and CBC daily F/u for long-term anticoagulation plan s/p extubation.  04/21/21 Pharmacy Student 02/21/2021,7:31 AM   04/21/2021 D. Chelsea Aus, PharmD, BCPS, BCCCP 02/21/2021, 2:08 PM

## 2021-02-22 ENCOUNTER — Inpatient Hospital Stay (HOSPITAL_COMMUNITY): Payer: 59

## 2021-02-22 DIAGNOSIS — I639 Cerebral infarction, unspecified: Secondary | ICD-10-CM | POA: Diagnosis not present

## 2021-02-22 LAB — BASIC METABOLIC PANEL
Anion gap: 9 (ref 5–15)
BUN: 17 mg/dL (ref 6–20)
CO2: 37 mmol/L — ABNORMAL HIGH (ref 22–32)
Calcium: 8.7 mg/dL — ABNORMAL LOW (ref 8.9–10.3)
Chloride: 93 mmol/L — ABNORMAL LOW (ref 98–111)
Creatinine, Ser: 0.53 mg/dL (ref 0.44–1.00)
GFR, Estimated: >60 mL/min (ref >60)
Glucose, Bld: 140 mg/dL — ABNORMAL HIGH (ref 70–99)
Potassium: 3.5 mmol/L (ref 3.5–5.1)
Sodium: 139 mmol/L (ref 135–145)

## 2021-02-22 LAB — CBC
HCT: 42.1 % (ref 36.0–46.0)
Hemoglobin: 13 g/dL (ref 12.0–15.0)
MCH: 28.6 pg (ref 26.0–34.0)
MCHC: 30.9 g/dL (ref 30.0–36.0)
MCV: 92.5 fL (ref 80.0–100.0)
Platelets: 373 10*3/uL (ref 150–400)
RBC: 4.55 MIL/uL (ref 3.87–5.11)
RDW: 16.5 % — ABNORMAL HIGH (ref 11.5–15.5)
WBC: 14.3 10*3/uL — ABNORMAL HIGH (ref 4.0–10.5)
nRBC: 0 % (ref 0.0–0.2)

## 2021-02-22 LAB — GLUCOSE, CAPILLARY
Glucose-Capillary: 146 mg/dL — ABNORMAL HIGH (ref 70–99)
Glucose-Capillary: 153 mg/dL — ABNORMAL HIGH (ref 70–99)
Glucose-Capillary: 181 mg/dL — ABNORMAL HIGH (ref 70–99)
Glucose-Capillary: 238 mg/dL — ABNORMAL HIGH (ref 70–99)
Glucose-Capillary: 247 mg/dL — ABNORMAL HIGH (ref 70–99)
Glucose-Capillary: 262 mg/dL — ABNORMAL HIGH (ref 70–99)

## 2021-02-22 LAB — HEPARIN LEVEL (UNFRACTIONATED): Heparin Unfractionated: 0.32 IU/mL (ref 0.30–0.70)

## 2021-02-22 MED ORDER — POTASSIUM CHLORIDE 20 MEQ PO PACK
40.0000 meq | PACK | Freq: Once | ORAL | Status: AC
  Administered 2021-02-22: 40 meq
  Filled 2021-02-22: qty 2

## 2021-02-22 MED ORDER — BETHANECHOL CHLORIDE 10 MG PO TABS
10.0000 mg | ORAL_TABLET | Freq: Once | ORAL | Status: DC
  Filled 2021-02-22: qty 1

## 2021-02-22 MED ORDER — FUROSEMIDE 10 MG/ML IJ SOLN
40.0000 mg | Freq: Once | INTRAMUSCULAR | Status: AC
Start: 2021-02-22 — End: 2021-02-22
  Administered 2021-02-22: 40 mg via INTRAVENOUS
  Filled 2021-02-22: qty 4

## 2021-02-22 MED ORDER — POTASSIUM CHLORIDE 20 MEQ PO PACK
20.0000 meq | PACK | Freq: Once | ORAL | Status: AC
  Administered 2021-02-22: 20 meq
  Filled 2021-02-22: qty 1

## 2021-02-22 MED ORDER — LOPERAMIDE HCL 1 MG/7.5ML PO SUSP
4.0000 mg | Freq: Three times a day (TID) | ORAL | Status: DC | PRN
  Administered 2021-02-22 – 2021-02-23 (×2): 4 mg
  Filled 2021-02-22 (×3): qty 30

## 2021-02-22 MED ORDER — DEXAMETHASONE SODIUM PHOSPHATE 10 MG/ML IJ SOLN
6.0000 mg | Freq: Four times a day (QID) | INTRAMUSCULAR | Status: AC
  Administered 2021-02-22 – 2021-02-23 (×4): 6 mg via INTRAVENOUS
  Filled 2021-02-22 (×4): qty 1

## 2021-02-22 MED ORDER — BETHANECHOL CHLORIDE 10 MG PO TABS
10.0000 mg | ORAL_TABLET | Freq: Once | ORAL | Status: AC
  Administered 2021-02-22: 10 mg

## 2021-02-22 NOTE — Progress Notes (Signed)
SLP Cancellation Note  Patient Details Name: Taylor [name] MRN: [id number] DOB: [birth year]   Cancelled treatment:       Reason Eval/Treat Not Completed: Patient not medically ready (Pt remains on the vent at this time. SLP will follow up.)  Candie Gintz I. Vear Clock, MS, CCC-SLP Acute Rehabilitation Services Office number [phone] Pager [phone]  Scheryl Marten 02/22/2021, 8:08 AM

## 2021-02-22 NOTE — Progress Notes (Signed)
ANTICOAGULATION CONSULT NOTE - Follow Up Consult  Pharmacy Consult for Heparin Indication:  LV thrombus, s/p CVA   No Known Allergies  Patient Measurements: Height: 5\' 1"  (154.9 cm) Weight: 62.8 kg (138 lb 7.2 oz) IBW/kg (Calculated) : 47.8 Heparin Dosing Weight: 63.4 kg  Vital Signs: Temp: 97.6 F (36.4 C) (01/06 1200) Temp Source: Oral (01/06 1200) BP: 108/77 (01/06 1113) Pulse Rate: 103 (01/06 1113)  Labs: Recent Labs    02/20/21 0445 02/20/21 0754 02/20/21 1420 02/21/21 0537 02/21/21 1618 02/22/21 0355  HGB 11.9*  --  13.3 12.8  --  13.0  HCT 39.7  --  39.0 41.0  --  42.1  PLT 307  --   --  360  --  373  HEPARINUNFRC 0.35  --   --  0.21* 0.37 0.32  CREATININE  --  0.61  --  0.66  --  0.53    Estimated Creatinine Clearance: 66.7 mL/min (by C-G formula based on SCr of 0.53 mg/dL).   Assessment: 55 YOF continuing heparin for new LV thrombus in setting of acute CVA. S/p mechanical thrombectomy and TNK.   Heparin level this morning 0.32, therapeutic x 2, but trending down. Previous levels followed the same trend (0.47 >> 0.35 >> 0.21).   No bleeding reported. CBC WNL/stable.    Goal of Therapy:  Heparin level 0.3-0.5 units/ml Monitor platelets by anticoagulation protocol: Yes Monitor for s/sx of bleeding   Plan:  Increase heparin drip to 1250 units/hr Monitor HL and CBC daily F/u for long-term anticoagulation plan s/p extubation.  04/22/21 PharmD Student

## 2021-02-22 NOTE — Progress Notes (Signed)
°  Interdisciplinary Goals of Care Family Meeting   Date carried out:: 02/22/2021  Location of the meeting:  phone  Member's involved: Nurse Practitioner and Family Member or next of kin  Durable Power of Attorney or acting medical decision maker: [name]  Discussion: We discussed goals of care for Taylor [name] .  Spoke with [name] over phone about GOC. Expressed that we will try to wean her off the vent and consider reattempting extubation possibly this weekend. I did tell him that if she decompensates and requires reintubation we would need to consider tracheostomy. Son expresses that they want everything done and are okay with tracheostomy tube if needed.  Code status: Full Code  Disposition: Continue current acute care  Time spent for the meeting: 20 minutes  Lidia Collum 02/22/2021, 11:49 AM

## 2021-02-22 NOTE — Progress Notes (Signed)
NAME:  Taylor [name], MRN:  [id number], DOB:  Sep 16, 1965, LOS: 83 ADMISSION DATE:  01/29/2021, CONSULTATION DATE:  12/27 REFERRING MD:  Dr Tennis Must Sindy Messing , CHIEF COMPLAINT:   CVA  History of Present Illness:  56 year old female with PMH as below, which is significant for HFrEF with LVEF 20-25 % (2017), DM, and OSA on CPAP. 12/27 she presented to [facility] ED with complaints of acute onset left sided weakness and slurred speech. EMS noted non-verbal and right gaze preference. Imaging consistent with occlusion of the distal basilar arterty, right P1, and proximal R P2. She was given systemic TNK and mechanical thrombectomy  with stent retriever on the right with complete recanalization after one pass (TICI3). PCCM consulted for vent management and ICU care.    Pertinent  Medical History   has a past medical history of CHF (congestive heart failure) ([place]), Diabetes mellitus without complication ([place]), and Obstructive sleep apnea.   Significant Hospital Events: Including procedures, antibiotic start and stop dates in addition to other pertinent events   12/27 admit for posterior circulation stroke. TNK given and IR throbmectomy 12/28 echo with LV thrombus, EF 20-25%.  Started on heparin 12/30 failing weaning trials.  Started on cefepime for positive UA 1/1 On PSV weans 1/4: patient extubated and developed stridor and increase wob; required reintubation; decadron and lasix given  Interim History / Subjective:   Weaning on PSV  Objective   Blood pressure 114/81, pulse 99, temperature 98.8 F (37.1 C), temperature source Oral, resp. rate 18, height 5\' 1"  (1.549 m), weight 62.8 kg, SpO2 100 %.    Vent Mode: PSV;CPAP FiO2 (%):  [40 %] 40 % Set Rate:  [18 bmp] 18 bmp Vt Set:  [380 mL] 380 mL PEEP:  [5 cmH20-8 cmH20] 5 cmH20 Pressure Support:  [10 cmH20] 10 cmH20 Plateau Pressure:  [15 cmH20-17 cmH20] 15 cmH20   Intake/Output Summary (Last 24 hours) at 02/22/2021 1113 Last data filed  at 02/22/2021 0900 Gross per 24 hour  Intake 1661.71 ml  Output 2890 ml  Net -1228.29 ml   Filed Weights   02/20/21 0500 02/21/21 0500 02/22/21 0400  Weight: 66.7 kg 66.8 kg 62.8 kg    Examination: Gen:      No acute distress HEENT:  EOMI, sclera anicteric Neck:     No masses; no thyromegaly, ETT Lungs:    Clear to auscultation bilaterally; normal respiratory effort CV:         Regular rate and rhythm; no murmurs Abd:      + bowel sounds; soft, non-tender; no palpable masses, no distension Ext:    No edema; adequate peripheral perfusion Skin:      Warm and dry; no rash Neuro: alert and oriented x 3 Psych: normal mood and affect   Lab/imaging reviewed Metabolic panel is stable WBC count 14.3 Chest x-ray small left pleural effusion  Resolved Hospital Problem list     Assessment & Plan:  CVA: basilar, bilateral PI/PCA.  TNK given in ED and she is s/p mechanical thrombectomy on the right and mechanical thrombectomy with stent placement on the L.  P: -per neuro -continue statin and brillenta -avoid fevers; cbg goal <180; hob 30 degrees  Acute resp failure, inability to protect airway due to stroke Failed extubation on 1/4 due to stridor  OSA on CPAP P: continue vent today. Continuing diuresis.  Still does not have a cuff leak.  Will give Decadron for 24 hours and reattempt tomorrow  Tracheal Edema/Stenosis due to endotracheal tube Give decadron 8mg  1/4 P: Restart steroids for 24 more hours  Chronic HFrEF: LVEF 20-25% LV thrombus P: -heparin gtt; consider changing to doac -continue midodrine Continue Lasix  E. coli UTI, MSSA pneumonia P: Finished cefazolin  DM P: Continue SSI coverage  Best Practice (right click and "Reselect all SmartList Selections" daily)   Diet/type: tubefeeds DVT prophylaxis: systemic heparin GI prophylaxis: PPI Lines: N/A Foley:  Yes, and it is still needed Code Status:  full code Last date of multidisciplinary goals of care  discussion [1/5 udated brandon son over phone ]  Critical care time:   The patient is critically ill with multiple organ system failure and requires high complexity decision making for assessment and support, frequent evaluation and titration of therapies, advanced monitoring, review of radiographic studies and interpretation of complex data.   Critical Care Time devoted to patient care services, exclusive of separately billable procedures, described in this note is 35 minutes.   Marshell Garfinkel MD [facility] See Amion for pager  If no response to pager , please call [phone] until 7pm After 7:00 pm call Elink  [phone] 02/22/2021, 4:32 PM   ,

## 2021-02-22 NOTE — Progress Notes (Signed)
STROKE TEAM PROGRESS NOTE    INTERVAL HISTORY:  Emergently reintubated yesterday afternoon after only a short period of extubation for respiratory failure..  She is currently weaning 40% 5/5.  CCM plans to diurese her over the weekend as well as give her steroids for vocal cord edema that they noticed when they reintubated her.  Patient is seen in her room with no family at the bedside.  She is alert and will communicate by nodding.  She has been hemodynamically stable, and her neurological exam remains stable.    Vital signs stable.  CCM plan to continue diuresis and give Decadron for 24 more hours and reattempt extubation tomorrow. Vitals:   02/22/21 1400 02/22/21 1500 02/22/21 1556 02/22/21 1557  BP: 121/78 109/72 97/75   Pulse: 99 91 93   Resp: (!) 21 18 18    Temp:      TempSrc:      SpO2: 100% 100% 100% 100%  Weight:      Height:       CBC:  Recent Labs  Lab 02/21/21 0537 02/22/21 0355  WBC 13.7* 14.3*  HGB 12.8 13.0  HCT 41.0 42.1  MCV 91.5 92.5  PLT 360 [id number]   Basic Metabolic Panel:  Recent Labs  Lab 02/21/21 0537 02/22/21 0355  NA 140 139  K 4.3 3.5  CL 100 93*  CO2 31 37*  GLUCOSE 221* 140*  BUN 16 17  CREATININE 0.66 0.53  CALCIUM 8.5* 8.7*   Lipid Panel:  No results for input(s): CHOL, TRIG, HDL, CHOLHDL, VLDL, LDLCALC in the last 168 hours.  HgbA1c:  No results for input(s): HGBA1C in the last 168 hours.  Urine Drug Screen: No results for input(s): LABOPIA, COCAINSCRNUR, LABBENZ, AMPHETMU, THCU, LABBARB in the last 168 hours.  Alcohol Level  No results for input(s): ETH in the last 168 hours.   IMAGING past 24 hours DG Chest Port 1 View  Result Date: 02/22/2021 CLINICAL DATA:  Acute respiratory failure.  Hypoxia. EXAM: PORTABLE CHEST 1 VIEW COMPARISON:  Chest radiograph dated February 21, 2020 FINDINGS: The heart is mildly enlarged. Lungs are clear without focal consolidation. Possibly small left pleural effusion, unchanged. Endotracheal tube and  feeding tubes are unchanged. IMPRESSION: 1. Stable cardiomegaly. 2. Possibly small left pleural effusion, unchanged. 3. Lines and tubes are also unchanged. Electronically Signed   By: Keane Police D.O.   On: 02/22/2021 08:31    PHYSICAL EXAM  Temp:  [97.6 F (36.4 C)-99.3 F (37.4 C)] 97.6 F (36.4 C) (01/06 1200) Pulse Rate:  [80-103] 93 (01/06 1556) Resp:  [17-29] 18 (01/06 1556) BP: (97-121)/(57-90) 97/75 (01/06 1556) SpO2:  [97 %-100 %] 100 % (01/06 1557) FiO2 (%):  [40 %] 40 % (01/06 1557) Weight:  [62.8 kg] 62.8 kg (01/06 0400)  General -mildly obese [name], intubated off sedation.  Cardiovascular - Regular rate and rhythm. Lungs clear to auscultation Neurological Exam- intubated off sedation, awake alert follows most commands.  Right gaze preference unable to look to the left past midline.  PERRL, horizontal nystagmus present, right gaze preference.  Patient does not blink to threat on left side.  She is able to move all four extremities in response to commands with full strength in RUE and RLE and 1-2/5 strength in LUE and LLE. Hemiplegia on the left   ASSESSMENT/PLAN Ms. SHATARA BUTTRUM is a 56 y.o. female with history of CHF, T2DM and OSA presenting with acute onset left-sided weakness, right gaze deviation and slurred speech.  She was found to have occlusion of the distal basilar artery, right P1 and proximal right P2.  She was given TNK and received mechanical thrombectomy to the right and left PCA with stenting on the left. She was found to have two apical thrombi on her 2D echocardiogram and remains on IV heparin. Extubated and then emergently reintubated on 02/20/2021.  She is currently on pressure support 5/5 with an FiO2 40%.  CCM plans to diurese her and give her steroids in hopes of extubating over the weekend.  Stroke:  embolic shower with b/l PCA occlusion s/p IR with TICI3 and left PCA stenting, likely secondary due to embolism from low EF and  apical thrombi Code Stroke CT head No acute abnormality.  ASPECTS 10.    CTA head & neck nonopacification of distal basilar artery, right P1 and P2, poor opacification of right vertebral artery, focal stenosis in right A1 and A3 IR with b/l P1 occlusion s/p thrombectomy with TICI3 and left PCA stent MRI  Acute right PCA territory infarct, in right thalamus and occipital lobe, small acute infarcts in bilateral cerebellar hemispheres with scattered small acute infarcts in bilateral parietal lobes, left occipital and left frontal lobes, area of susceptibility infarct in right occipital lobe suggestive of petechial hemorrhage MRA thrombectomy and placement of left P1/P2 stent with good flow related signal in distal left PCA, poor flow signal in distal basilar tip, poor opacification of right vertebral artery, right A1 and A3 ACA stenosis 2D Echo global hypokinesis with inferior akinesis, EF 0000000, grade 3 diastolic dysfunction, thrombi in inferoapical and anteroapical areas, no atrial level shunt LDL 151 HgbA1c 11.9 VTE prophylaxis -Heparin IV aspirin 81 mg daily prior to admission, Now on heparin IV and Brilinta.  Transition to PO AC when extubated. 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 Sensitivity pending On cefepime Management per CCM  Hyperlipidemia Home meds:  atorvastatin 40 mg daily, resumed in hospital LDL 151, goal < 70 Increase to 80 mg atorvastatin daily   Continue statin at discharge  Diabetes type II Uncontrolled Home meds:  Jardiance 25 mg daily, levemir 17 units BID, metformin 1000 mg BID HgbA1c 11.9, goal < 7.0 CBGs Diabetes coordinator consult SSI  Other Stroke Risk Factors Former cigarette smoker Obstructive sleep apnea  Other Active Problems   Hospital day # 10  Patient unfortunately did not tolerate extubation and was reintubated for airway protection.  Continue ventilatory support for respiratory failure as per critical care team and possibly repeat trial of extubation in a few days otherwise she may need tracheostomy later if prolonged ventilatory support is needed.  Continue diuresis and Decadron for 24 hours and reattempt extubation tomorrow as per CCM team..No family at the bedside. This patient is critically ill and at significant risk of neurological worsening, death and care requires constant monitoring of vital signs, hemodynamics,respiratory and cardiac monitoring, extensive review of multiple databases, frequent neurological assessment, discussion with family, other specialists and medical decision making of high complexity.I have made any additions or clarifications  directly to the above note.This critical care time does not reflect procedure time, or teaching time or supervisory time of PA/NP/Med Resident etc but could involve care discussion time.  I spent 30 minutes of neurocritical care time  in the care of  this patient.           Antony Contras, MD Medical Director [facility] Pager: [phone] 02/22/2021 4:44 PM     To contact Stroke Continuity provider, please refer to [email or web address] After hours, contact General Neurology

## 2021-02-23 DIAGNOSIS — I639 Cerebral infarction, unspecified: Secondary | ICD-10-CM | POA: Diagnosis not present

## 2021-02-23 DIAGNOSIS — Z4659 Encounter for fitting and adjustment of other gastrointestinal appliance and device: Secondary | ICD-10-CM

## 2021-02-23 DIAGNOSIS — Z978 Presence of other specified devices: Secondary | ICD-10-CM | POA: Diagnosis not present

## 2021-02-23 DIAGNOSIS — J9601 Acute respiratory failure with hypoxia: Secondary | ICD-10-CM | POA: Diagnosis not present

## 2021-02-23 LAB — BASIC METABOLIC PANEL
Anion gap: 9 (ref 5–15)
BUN: 25 mg/dL — ABNORMAL HIGH (ref 6–20)
CO2: 33 mmol/L — ABNORMAL HIGH (ref 22–32)
Calcium: 9.1 mg/dL (ref 8.9–10.3)
Chloride: 96 mmol/L — ABNORMAL LOW (ref 98–111)
Creatinine, Ser: 0.59 mg/dL (ref 0.44–1.00)
GFR, Estimated: >60 mL/min (ref >60)
Glucose, Bld: 268 mg/dL — ABNORMAL HIGH (ref 70–99)
Potassium: 5.3 mmol/L — ABNORMAL HIGH (ref 3.5–5.1)
Sodium: 138 mmol/L (ref 135–145)

## 2021-02-23 LAB — CBC
HCT: 41.8 % (ref 36.0–46.0)
Hemoglobin: 12.8 g/dL (ref 12.0–15.0)
MCH: 28.4 pg (ref 26.0–34.0)
MCHC: 30.6 g/dL (ref 30.0–36.0)
MCV: 92.7 fL (ref 80.0–100.0)
Platelets: 408 10*3/uL — ABNORMAL HIGH (ref 150–400)
RBC: 4.51 MIL/uL (ref 3.87–5.11)
RDW: 16.5 % — ABNORMAL HIGH (ref 11.5–15.5)
WBC: 15 10*3/uL — ABNORMAL HIGH (ref 4.0–10.5)
nRBC: 0 % (ref 0.0–0.2)

## 2021-02-23 LAB — GLUCOSE, CAPILLARY
Glucose-Capillary: 287 mg/dL — ABNORMAL HIGH (ref 70–99)
Glucose-Capillary: 223 mg/dL — ABNORMAL HIGH (ref 70–99)
Glucose-Capillary: 254 mg/dL — ABNORMAL HIGH (ref 70–99)
Glucose-Capillary: 158 mg/dL — ABNORMAL HIGH (ref 70–99)
Glucose-Capillary: 185 mg/dL — ABNORMAL HIGH (ref 70–99)
Glucose-Capillary: 202 mg/dL — ABNORMAL HIGH (ref 70–99)

## 2021-02-23 LAB — HEPARIN LEVEL (UNFRACTIONATED): Heparin Unfractionated: 0.43 IU/mL (ref 0.30–0.70)

## 2021-02-23 MED ORDER — CHLORHEXIDINE GLUCONATE CLOTH 2 % EX PADS
6.0000 | MEDICATED_PAD | Freq: Every day | CUTANEOUS | Status: DC
  Administered 2021-02-23 – 2021-03-03 (×9): 6 via TOPICAL

## 2021-02-23 MED ORDER — LOPERAMIDE HCL 1 MG/7.5ML PO SUSP
4.0000 mg | Freq: Three times a day (TID) | ORAL | Status: DC | PRN
  Administered 2021-02-23 – 2021-03-07 (×12): 4 mg
  Filled 2021-02-23 (×14): qty 30

## 2021-02-23 MED ORDER — LOPERAMIDE HCL 1 MG/7.5ML PO SUSP
4.0000 mg | Freq: Three times a day (TID) | ORAL | Status: DC | PRN
  Filled 2021-02-23: qty 30

## 2021-02-23 MED ORDER — FUROSEMIDE 10 MG/ML IJ SOLN
40.0000 mg | Freq: Two times a day (BID) | INTRAMUSCULAR | Status: DC
  Administered 2021-02-23 – 2021-02-25 (×5): 40 mg via INTRAVENOUS
  Filled 2021-02-23 (×5): qty 4

## 2021-02-23 NOTE — Progress Notes (Signed)
NAME:  Taylor [name], MRN:  [id number], DOB:  May 30, 1965, LOS: 11 ADMISSION DATE:  02/04/2021, CONSULTATION DATE:  12/27 REFERRING MD:  Dr Tommi Rumps Melchor Amour , CHIEF COMPLAINT:   CVA  History of Present Illness:  56 year old female with PMH as below, which is significant for HFrEF with LVEF 20-25 % (2017), DM, and OSA on CPAP. 12/27 she presented to [facility] ED with complaints of acute onset left sided weakness and slurred speech. EMS noted non-verbal and right gaze preference. Imaging consistent with occlusion of the distal basilar arterty, right P1, and proximal R P2. She was given systemic TNK and mechanical thrombectomy  with stent retriever on the right with complete recanalization after one pass (TICI3). PCCM consulted for vent management and ICU care.    Pertinent  Medical History   has a past medical history of CHF (congestive heart failure) (HCC), Diabetes mellitus without complication (HCC), and Obstructive sleep apnea.   Significant Hospital Events: Including procedures, antibiotic start and stop dates in addition to other pertinent events   12/27 admit for posterior circulation stroke. TNK given and IR throbmectomy 12/28 echo with LV thrombus, EF 20-25%.  Started on heparin 12/30 failing weaning trials.  Started on cefepime for positive UA 1/1 On PSV weans 1/4: patient extubated and developed stridor and increase wob; required reintubation; decadron and lasix given  Interim History / Subjective:   Weaning on pressure support.  Still with no cuff leak  Objective   Blood pressure 120/81, pulse (!) 105, temperature 97.8 F (36.6 C), temperature source Oral, resp. rate (!) 25, height 5\' 1"  (1.549 m), weight 60.5 kg, SpO2 100 %.    Vent Mode: PSV;CPAP FiO2 (%):  [30 %-40 %] 30 % Set Rate:  [18 bmp] 18 bmp Vt Set:  [380 mL] 380 mL PEEP:  [5 cmH20] 5 cmH20 Pressure Support:  [5 cmH20-10 cmH20] 5 cmH20 Plateau Pressure:  [17 cmH20-19 cmH20] 18 cmH20   Intake/Output Summary  (Last 24 hours) at 02/23/2021 4383 Last data filed at 02/23/2021 0800 Gross per 24 hour  Intake 1776.95 ml  Output 1850 ml  Net -73.05 ml   Filed Weights   02/21/21 0500 02/22/21 0400 02/23/21 0500  Weight: 66.8 kg 62.8 kg 60.5 kg    Examination: Gen:      No acute distress HEENT:  EOMI, sclera anicteric Neck:     No masses; no thyromegaly ET tube Lungs:    Clear to auscultation bilaterally; normal respiratory effort CV:         Regular rate and rhythm; no murmurs Abd:      + bowel sounds; soft, non-tender; no palpable masses, no distension Ext:    No edema; adequate peripheral perfusion Skin:      Warm and dry; no rash Neuro: alert and oriented x 3 Psych: normal mood and affect   Lab/imaging reviewed Potassium 5.3, BUN/creatinine 25/0.59 WBC 15 No new imaging  Resolved Hospital Problem list     Assessment & Plan:  CVA: basilar, bilateral PI/PCA.  TNK given in ED and she is s/p mechanical thrombectomy on the right and mechanical thrombectomy with stent placement on the L.  P: -per neuro -continue statin and brillenta -avoid fevers; cbg goal <180; hob 30 degrees  Acute resp failure, inability to protect airway due to stroke Failed extubation on 1/4 due to stridor  OSA on CPAP P: continue vent today. Continuing diuresis.  Still does not have a cuff leak.  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 coli UTI, MSSA pneumonia P: Finished cefazolin  DM P: Continue SSI coverage  Goals of care Discussed with family yesterday 1/6.  We can attempt extubation over the weekend but she is at high risk for requiring reintubation as she does not have a cuff leak in spite of steroids.  They are okay with  tracheostomy if she does get reintubated  Best Practice (right click and "Reselect all SmartList Selections" daily)   Diet/type: tubefeeds DVT prophylaxis: systemic heparin GI prophylaxis: PPI Lines: N/A Foley:  Yes, and it is still needed Code Status:  full code Last date of multidisciplinary goals of care discussion [1/6 udated brandon son over phone ]  Critical care time:   The patient is critically ill with multiple organ system failure and requires high complexity decision making for assessment and support, frequent evaluation and titration of therapies, advanced monitoring, review of radiographic studies and interpretation of complex data.   Critical Care Time devoted to patient care services, exclusive of separately billable procedures, described in this note is 35 minutes.   Marshell Garfinkel MD [facility] See Amion for pager  If no response to pager , please call [phone] until 7pm After 7:00 pm call Elink  [phone] 02/23/2021, 9:39 AM   ,

## 2021-02-23 NOTE — Evaluation (Signed)
SLP Cancellation Note  Patient Details Name: Taylor [name] MRN: [id number] DOB: 04-Aug-1965   Cancelled treatment:       Reason Eval/Treat Not Completed: Patient not medically ready (Pt remains on the vent at this time. SLP will follow up.))  Rolena Infante, MS [facility] SLP Acute Rehab Services Office [phone] Pager [phone]   Chales Abrahams 02/23/2021, 7:31 AM

## 2021-02-23 NOTE — Progress Notes (Signed)
STROKE TEAM PROGRESS NOTE   INTERVAL HISTORY:  No family at bedside.  Patient still intubated, very lethargic, not as interactive as before.  Heart rate 110s, BP still soft.  Discussed with Dr. Vaughan Browner CCM, will continue diuresis today and attempt extubation tomorrow if possible  Vitals:   02/23/21 1200 02/23/21 1300 02/23/21 1400 02/23/21 1517  BP: 119/87 116/78 114/76   Pulse: (!) 111 (!) 106 (!) 105 (!) 104  Resp: (!) 30 (!) 29 (!) 27 (!) 28  Temp: 99.1 F (37.3 C)     TempSrc: Axillary     SpO2: 99% 100% 97% 100%  Weight:      Height:       CBC:  Recent Labs  Lab 02/22/21 0355 02/23/21 0510  WBC 14.3* 15.0*  HGB 13.0 12.8  HCT 42.1 41.8  MCV 92.5 92.7  PLT 373 123XX123*   Basic Metabolic Panel:  Recent Labs  Lab 02/22/21 0355 02/23/21 0510  NA 139 138  K 3.5 5.3*  CL 93* 96*  CO2 37* 33*  GLUCOSE 140* 268*  BUN 17 25*  CREATININE 0.53 0.59  CALCIUM 8.7* 9.1   Lipid Panel:  No results for input(s): CHOL, TRIG, HDL, CHOLHDL, VLDL, LDLCALC in the last 168 hours.  HgbA1c:  No results for input(s): HGBA1C in the last 168 hours.  Urine Drug Screen: No results for input(s): LABOPIA, COCAINSCRNUR, LABBENZ, AMPHETMU, THCU, LABBARB in the last 168 hours.  Alcohol Level  No results for input(s): ETH in the last 168 hours.   IMAGING past 24 hours No results found.  PHYSICAL EXAM  Temp:  [97.1 F (36.2 C)-99.5 F (37.5 C)] 99.1 F (37.3 C) (01/07 1200) Pulse Rate:  [84-115] 104 (01/07 1517) Resp:  [18-35] 28 (01/07 1517) BP: (96-129)/(76-99) 114/76 (01/07 1400) SpO2:  [97 %-100 %] 100 % (01/07 1517) FiO2 (%):  [30 %-40 %] 40 % (01/07 1517) Weight:  [60.5 kg] 60.5 kg (01/07 0500)  General - [name], intubated off sedation.  Cardiovascular - Regular rhythm but tachycardia.  Neuro - intubated off sedation, awake alert but very lethargic, follows most commands but less active.  Right gaze preference barely cross midline.  PERRL,  horizontal nystagmus present on left gaze.  Not blink to threat on left side. RUE 2/5 proximal and 3/5 distal and 2/5 RLE today, and 0/5 in LUE and LLE today. Sensation, coordination and gait not tested.   ASSESSMENT/PLAN [name] is a 56 y.o. female with history of CHF, T2DM and OSA presenting with acute onset left-sided weakness, right gaze deviation and slurred speech. She was found to have occlusion of the distal basilar artery, right P1 and proximal right P2.  She was given TNK and received mechanical thrombectomy to the right and left PCA with stenting on the left. She was found to have two apical thrombi on her 2D echocardiogram and remains on IV heparin. Extubated and then emergently reintubated on 02/20/2021.  She is currently on pressure support 5/5 with an FiO2 40%.  CCM plans to diurese her and give her steroids in hopes of extubating over the weekend.  Stroke:  embolic shower with b/l PCA occlusion s/p IR with TICI3 and left PCA stenting, likely secondary due to embolism from low EF and apical thrombi Code Stroke CT head No acute abnormality.  ASPECTS 10.    CTA head & neck nonopacification of distal basilar artery, right P1 and P2, poor opacification of right vertebral artery, focal stenosis in right  A1 and A3 IR with b/l P1 occlusion s/p thrombectomy with TICI3 and left PCA stent MRI  Acute right PCA territory infarct, in right thalamus and occipital lobe, small acute infarcts in bilateral cerebellar hemispheres with scattered small acute infarcts in bilateral parietal lobes, left occipital and left frontal lobes, area of susceptibility infarct in right occipital lobe suggestive of petechial hemorrhage MRA thrombectomy and placement of left P1/P2 stent with good flow related signal in distal left PCA, poor flow signal in distal basilar tip, poor opacification of right vertebral artery, right A1 and A3 ACA stenosis 2D Echo global hypokinesis with inferior akinesis, EF 20-25%, grade 3  diastolic dysfunction, thrombi in inferoapical and anteroapical areas, no atrial level shunt LDL 151 HgbA1c 11.9 VTE prophylaxis -Heparin IV aspirin 81 mg daily prior to admission, Now on heparin IV and Brilinta.  Transition to PO AC once extubated with p.o. access. 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% Two apical thrombi present Now on heparin IV and Brilinta  Respiratory failure Currently intubated and ventilated Management per CCM Reintubated 2 days ago for not tolerating extubation Still no cuff leak today On steroids for vocal cord edema Continue diuresis today Plan to attempt extubation tomorrow if possible  Hx of hypertension Hypotension Home meds:  lisinopril 2.5 mg daily Stable on the low end SBP 90-100s Off Levophed On midodrine 10 mg every 8h Long-term BP goal normotensive  Fever Leukocytosis UTI WBC 10.8-> 11.9->15.3-> 13.4-> 13.7->15.0 (on Decadron) T-max 100.6->99.1 ax. UA WBC > 50 Urine culture Gram neg rods. 02/17/21 E Coli Sensitive to all antibiotics tested On cefepime -completed course Management per CCM  Hyperlipidemia Home meds:  atorvastatin 40 mg daily, resumed in hospital LDL 151, goal < 70 Increase to 80 mg atorvastatin daily  Continue statin at discharge  Diabetes type II Uncontrolled Home meds:  Jardiance 25 mg daily, levemir 17 units BID, metformin 1000 mg BID HgbA1c 11.9, goal < 7.0 CBGs Diabetes coordinator consult SSI  Other Stroke Risk Factors Former cigarette smoker Obstructive sleep apnea  Other Active Problems Hyperkalemia - 5.3 - recheck in AM Dysphagia on tube feeding @ [facility] day # 11   This patient is critically ill due to embolic stroke, LV thrombus, CHF with low EF, respiratory failure, carotid stenosis status post stenting and at significant risk of neurological worsening, death form heart failure, respiratory failure, recurrent  stroke, hemorrhagic transformation. This patient's care requires constant monitoring of vital signs, hemodynamics, respiratory and cardiac monitoring, review of multiple databases, neurological assessment, discussion with family, other specialists and medical decision making of high complexity. I spent 35 minutes of neurocritical care time in the care of this patient.  I discussed with Dr. Kimber Relic CCM.  Taylor Hawking, MD PhD Stroke Neurology 02/23/2021 8:55 PM   To contact Stroke Continuity provider, please refer to [email or web address] After hours, contact General Neurology

## 2021-02-23 NOTE — Progress Notes (Signed)
ANTICOAGULATION CONSULT NOTE - Follow Up Consult  Pharmacy Consult for Heparin Indication:  LV thrombus, s/p CVA   No Known Allergies  Patient Measurements: Height: 5\' 1"  (154.9 cm) Weight: 60.5 kg (133 lb 6.1 oz) IBW/kg (Calculated) : 47.8 Heparin Dosing Weight: 63.4 kg  Vital Signs: Temp: 97.8 F (36.6 C) (01/07 0800) Temp Source: Oral (01/07 0800) BP: 120/81 (01/07 0800) Pulse Rate: 105 (01/07 0800)  Labs: Recent Labs    02/21/21 0537 02/21/21 1618 02/22/21 0355 02/23/21 0510  HGB 12.8  --  13.0 12.8  HCT 41.0  --  42.1 41.8  PLT 360  --  373 408*  HEPARINUNFRC 0.21* 0.37 0.32 0.43  CREATININE 0.66  --  0.53 0.59    Estimated Creatinine Clearance: 65.6 mL/min (by C-G formula based on SCr of 0.59 mg/dL).   Assessment: 35 YOF continuing heparin for new LV thrombus in setting of acute CVA. S/p mechanical thrombectomy and TNK.   Heparin level this morning is 0.43 and therapeutic. No bleeding reported. CBC WNL/stable.   Goal of Therapy:  Heparin level 0.3-0.5 units/ml Monitor platelets by anticoagulation protocol: Yes   Plan:  Continue heparin drip at 1250 units/hr Monitor heparin level and CBC daily Monitor for s/sx of bleeding F/u for long-term anticoagulation plan s/p extubation  Thank you for involving pharmacy in this patient's care.  Renold Genta, PharmD, BCPS Clinical Pharmacist Clinical phone for 02/23/2021 until 3p is [phone] 02/23/2021 9:24 AM  **Pharmacist phone directory can be found on [email or web address] listed under [place]**

## 2021-02-24 ENCOUNTER — Inpatient Hospital Stay (HOSPITAL_COMMUNITY): Payer: 59

## 2021-02-24 DIAGNOSIS — R509 Fever, unspecified: Secondary | ICD-10-CM

## 2021-02-24 DIAGNOSIS — J9601 Acute respiratory failure with hypoxia: Secondary | ICD-10-CM | POA: Diagnosis not present

## 2021-02-24 DIAGNOSIS — D72829 Elevated white blood cell count, unspecified: Secondary | ICD-10-CM

## 2021-02-24 DIAGNOSIS — I5020 Unspecified systolic (congestive) heart failure: Secondary | ICD-10-CM | POA: Diagnosis not present

## 2021-02-24 DIAGNOSIS — Z978 Presence of other specified devices: Secondary | ICD-10-CM | POA: Diagnosis not present

## 2021-02-24 DIAGNOSIS — I639 Cerebral infarction, unspecified: Secondary | ICD-10-CM | POA: Diagnosis not present

## 2021-02-24 LAB — CBC
HCT: 43 % (ref 36.0–46.0)
Hemoglobin: 12.9 g/dL (ref 12.0–15.0)
MCH: 28.2 pg (ref 26.0–34.0)
MCHC: 30 g/dL (ref 30.0–36.0)
MCV: 93.9 fL (ref 80.0–100.0)
Platelets: 406 10*3/uL — ABNORMAL HIGH (ref 150–400)
RBC: 4.58 MIL/uL (ref 3.87–5.11)
RDW: 16.9 % — ABNORMAL HIGH (ref 11.5–15.5)
WBC: 16.1 10*3/uL — ABNORMAL HIGH (ref 4.0–10.5)
nRBC: 0.1 % (ref 0.0–0.2)

## 2021-02-24 LAB — GLUCOSE, CAPILLARY
Glucose-Capillary: 127 mg/dL — ABNORMAL HIGH (ref 70–99)
Glucose-Capillary: 142 mg/dL — ABNORMAL HIGH (ref 70–99)
Glucose-Capillary: 146 mg/dL — ABNORMAL HIGH (ref 70–99)
Glucose-Capillary: 214 mg/dL — ABNORMAL HIGH (ref 70–99)
Glucose-Capillary: 357 mg/dL — ABNORMAL HIGH (ref 70–99)
Glucose-Capillary: 95 mg/dL (ref 70–99)

## 2021-02-24 LAB — BASIC METABOLIC PANEL
Anion gap: 10 (ref 5–15)
BUN: 31 mg/dL — ABNORMAL HIGH (ref 6–20)
CO2: 36 mmol/L — ABNORMAL HIGH (ref 22–32)
Calcium: 8.9 mg/dL (ref 8.9–10.3)
Chloride: 93 mmol/L — ABNORMAL LOW (ref 98–111)
Creatinine, Ser: 0.82 mg/dL (ref 0.44–1.00)
GFR, Estimated: >60 mL/min (ref >60)
Glucose, Bld: 180 mg/dL — ABNORMAL HIGH (ref 70–99)
Potassium: 5 mmol/L (ref 3.5–5.1)
Sodium: 139 mmol/L (ref 135–145)

## 2021-02-24 LAB — HEPARIN LEVEL (UNFRACTIONATED)
Heparin Unfractionated: 0.75 IU/mL — ABNORMAL HIGH (ref 0.30–0.70)
Heparin Unfractionated: 0.89 IU/mL — ABNORMAL HIGH (ref 0.30–0.70)
Heparin Unfractionated: 0.46 IU/mL (ref 0.30–0.70)

## 2021-02-24 MED ORDER — HEPARIN (PORCINE) 25000 UT/250ML-% IV SOLN
550.0000 [IU]/h | INTRAVENOUS | Status: AC
  Administered 2021-02-25 – 2021-02-27 (×3): 1050 [IU]/h via INTRAVENOUS
  Administered 2021-03-01: 22:00:00 950 [IU]/h via INTRAVENOUS
  Administered 2021-03-03: 600 [IU]/h via INTRAVENOUS
  Filled 2021-02-24 (×6): qty 250

## 2021-02-24 MED ORDER — VANCOMYCIN HCL 1250 MG/250ML IV SOLN
1250.0000 mg | Freq: Once | INTRAVENOUS | Status: AC
  Administered 2021-02-24: 1250 mg via INTRAVENOUS
  Filled 2021-02-24: qty 250

## 2021-02-24 MED ORDER — VANCOMYCIN HCL 1250 MG/250ML IV SOLN
1250.0000 mg | INTRAVENOUS | Status: DC
  Filled 2021-02-24: qty 250

## 2021-02-24 MED ORDER — SODIUM CHLORIDE 0.9 % IV SOLN
2.0000 g | Freq: Three times a day (TID) | INTRAVENOUS | Status: DC
  Administered 2021-02-24 – 2021-02-25 (×2): 2 g via INTRAVENOUS
  Filled 2021-02-24 (×2): qty 2

## 2021-02-24 MED ORDER — SODIUM CHLORIDE 0.9 % IV SOLN
2.0000 g | Freq: Once | INTRAVENOUS | Status: AC
  Administered 2021-02-24: 2 g via INTRAVENOUS
  Filled 2021-02-24: qty 2

## 2021-02-24 NOTE — Progress Notes (Signed)
PCCM note  Patient is weaning on the ventilator but no cuff leak in spite of multiple days of Decadron.  Her mental status is worsened and she appears very weak.  Now febrile with concern for new infection.  Started on cefepime and vancomycin  I do not believe she will tolerate another trial of extubation and will need a tracheostomy to progress.  Discussed with Dr. Erlinda Hong from stroke service who agreed.  I discussed with son Erlene Quan over telephone who is agrees for the tracheostomy but will need to discuss with his siblings and other family members before giving consent.  He will let us know tomorrow about the family decision.  Marshell Garfinkel MD [facility] See Amion for pager  If no response to pager , please call [phone] until 7pm After 7:00 pm call Elink  [phone] 02/24/2021, 6:29 PM

## 2021-02-24 NOTE — Progress Notes (Addendum)
STROKE TEAM PROGRESS NOTE   INTERVAL HISTORY:  No family at the bedside. Patient is still intubated,  no cuff leak today. Consider tracheostomy this week. BP stable, HR 118-120s, still lethargic but no neurological change.  febrile 101.39F, resp culture, blood cultures sent. Vanco and  cefepime started. Steroid course completed 02/23/2021.   Vitals:   02/24/21 0415 02/24/21 0500 02/24/21 0600 02/24/21 0743  BP: 93/68 95/70 99/72  105/73  Pulse: 92 90 93 97  Resp: 18 18 18 18   Temp:  99.6 F (37.6 C)  (!) 101.2 F (38.4 C)  TempSrc:  Axillary  Axillary  SpO2: 100% 100% 100% 100%  Weight:      Height:       CBC:  Recent Labs  Lab 02/23/21 0510 02/24/21 0653  WBC 15.0* 16.1*  HGB 12.8 12.9  HCT 41.8 43.0  MCV 92.7 93.9  PLT 408* 406*    Basic Metabolic Panel:  Recent Labs  Lab 02/23/21 0510 02/24/21 0653  NA 138 139  K 5.3* 5.0  CL 96* 93*  CO2 33* 36*  GLUCOSE 268* 180*  BUN 25* 31*  CREATININE 0.59 0.82  CALCIUM 9.1 8.9    Lipid Panel:  No results for input(s): CHOL, TRIG, HDL, CHOLHDL, VLDL, LDLCALC in the last 168 hours.  HgbA1c:  No results for input(s): HGBA1C in the last 168 hours.  Urine Drug Screen: No results for input(s): LABOPIA, COCAINSCRNUR, LABBENZ, AMPHETMU, THCU, LABBARB in the last 168 hours.  Alcohol Level  No results for input(s): ETH in the last 168 hours.   IMAGING past 24 hours No results found.  PHYSICAL EXAM  Temp:  [99.1 F (37.3 C)-101.2 F (38.4 C)] 101.2 F (38.4 C) (01/08 0743) Pulse Rate:  [77-115] 97 (01/08 0743) Resp:  [17-35] 18 (01/08 0743) BP: (93-129)/(66-99) 105/73 (01/08 0743) SpO2:  [97 %-100 %] 100 % (01/08 0743) FiO2 (%):  [30 %-40 %] 30 % (01/08 0743) Weight:  [60.7 kg] 60.7 kg (01/08 0413)  General - [name], intubated off sedation.  Cardiovascular - Regular rhythm but tachycardia.  Neuro - intubated off sedation, awake alert but very lethargic, follows most commands but less  active.  Right gaze preference barely cross midline.  PERRL, horizontal nystagmus present on left gaze.  Not blink to threat on left side. RUE 2/5 proximal and 3/5 distal and 2/5 RLE today, and 0/5 in LUE and LLE today. Sensation, coordination and gait not tested.   ASSESSMENT/PLAN Taylor [name] is a 56 y.o. female with history of CHF, T2DM and OSA presenting with acute onset left-sided weakness, right gaze deviation and slurred speech. She was found to have occlusion of the distal basilar artery, right P1 and proximal right P2.  She was given TNK and received mechanical thrombectomy to the right and left PCA with stenting on the left. She was found to have two apical thrombi on her 2D echocardiogram and remains on IV heparin. Extubated and then emergently reintubated on 02/20/2021.  She is currently on pressure support 5/5 with an FiO2 30%.  CCM and steroid and diuretic course completed 02/23/2021 for swollen airway.   Stroke:  embolic shower with b/l PCA occlusion s/p IR with TICI3 and left PCA stenting, likely secondary due to embolism from low EF and apical thrombi Code Stroke CT head No acute abnormality.  ASPECTS 10.    CTA head & neck nonopacification of distal basilar artery, right P1 and P2, poor opacification of right vertebral artery, focal stenosis  in right A1 and A3 IR with b/l P1 occlusion s/p thrombectomy with TICI3 and left PCA stent MRI  Acute right PCA territory infarct, in right thalamus and occipital lobe, small acute infarcts in bilateral cerebellar hemispheres with scattered small acute infarcts in bilateral parietal lobes, left occipital and left frontal lobes, area of susceptibility infarct in right occipital lobe suggestive of petechial hemorrhage MRA thrombectomy and placement of left P1/P2 stent with good flow related signal in distal left PCA, poor flow signal in distal basilar tip, poor opacification of right vertebral artery, right A1 and A3 ACA stenosis 2D Echo global  hypokinesis with inferior akinesis, EF 0000000, grade 3 diastolic dysfunction, thrombi in inferoapical and anteroapical areas, no atrial level shunt LDL 151 HgbA1c 11.9 VTE prophylaxis -Heparin IV aspirin 81 mg daily prior to admission, Now on heparin IV and Brilinta.  Transition to PO AC once extubated with p.o. access. 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% Two apical thrombi present Now on heparin IV and Brilinta  Respiratory failure Currently intubated and ventilated Management per CCM Reintubated 1/4 for not tolerating extubation Still no cuff leak today- 1/8 Vocal cord edema- steroids completed Continue diuresis today Plan to attempt extubation tomorrow if possible  Hx of hypertension Hypotension Home meds:  lisinopril 2.5 mg daily Stable on the low end SBP 90-100s Off Levophed On midodrine 10 mg every 8h Long-term BP goal normotensive  Fever Leukocytosis UTI WBC 10.8-> 11.9->15.3-> 13.4-> 13.7->15.0 -> 16.1 T-max 100.6->99.1 ax. UA WBC > 50 Urine culture Gram neg rods. 02/17/21 E Coli Sensitive to all antibiotics tested On cefepime -completed course Management per CCM Now on vanco and cefepime 02/24/2021  Hyperlipidemia Home meds:  atorvastatin 40 mg daily, resumed in hospital LDL 151, goal < 70 Increase to 80 mg atorvastatin daily  Continue statin at discharge  Diabetes type II Uncontrolled Home meds:  Jardiance 25 mg daily, levemir 17 units BID, metformin 1000 mg BID HgbA1c 11.9, goal < 7.0 CBGs Diabetes coordinator consult SSI- 0-20u q4  Other Stroke Risk Factors Former cigarette smoker Obstructive sleep apnea  Other Active Problems Hyperkalemia - 5.3 - > 5.0 Dysphagia on tube feeding @ [facility] day # 12  Patient seen and examined by NP/APP with MD. MD to update note as needed.   Taylor Ores, DNP, FNP-BC [facility] Pager: [phone]  ATTENDING NOTE: I reviewed above note and agree with the assessment and plan. Pt was seen and examined.   No family at bedside.  RN and Taylor [name] at bedside.  Patient still very lethargic, however able to open eyes on voice, follow simple commands on and right upper and lower extremities although very lethargic.  Still has no cuff leak but weaning is okay.  Had a fever this morning 101.2 and worsening leukocytosis, put back on vancomycin and cefepime.  Discussed with Taylor [name], patient likely not a candidate for extubation for short period of time, recommend consider tracheostomy.  For detailed assessment and plan, please refer to above as I have made changes wherever appropriate.   This patient is critically ill due to embolic stroke, LV thrombus, CHF with low EF, respiratory failure, carotid stenosis status post stenting, fever and leukocytosis and at significant risk of neurological worsening, death form sepsis, heart failure, respiratory failure, recurrent stroke, hemorrhagic transformation. This patient's care requires constant monitoring of vital signs, hemodynamics, respiratory and cardiac monitoring, review of multiple databases, neurological assessment, discussion  with family, other specialists and medical decision making of high complexity. I spent 35 minutes of neurocritical care time in the care of this patient.  I discussed with Dr. Kimber Relic CCM.    Rosalin Hawking, MD PhD Stroke Neurology 02/24/2021 7:07 PM    To contact Stroke Continuity provider, please refer to [email or web address] After hours, contact General Neurology

## 2021-02-24 NOTE — Progress Notes (Addendum)
NAME:  Taylor [name], MRN:  [id number], DOB:  1965/03/24, LOS: 41 ADMISSION DATE:  01/27/2021, CONSULTATION DATE:  12/27 REFERRING MD:  Dr Tennis Must Sindy Messing , CHIEF COMPLAINT:   CVA  History of Present Illness:  56 year old female with PMH as below, which is significant for HFrEF with LVEF 20-25 % (2017), DM, and OSA on CPAP. 12/27 she presented to [facility] ED with complaints of acute onset left sided weakness and slurred speech. EMS noted non-verbal and right gaze preference. Imaging consistent with occlusion of the distal basilar arterty, right P1, and proximal R P2. She was given systemic TNK and mechanical thrombectomy  with stent retriever on the right with complete recanalization after one pass (TICI3). PCCM consulted for vent management and ICU care.    Pertinent  Medical History   has a past medical history of CHF (congestive heart failure) ([place]), Diabetes mellitus without complication ([place]), and Obstructive sleep apnea.   Significant Hospital Events: Including procedures, antibiotic start and stop dates in addition to other pertinent events   12/27 admit for posterior circulation stroke. TNK given and IR throbmectomy 12/28 echo with LV thrombus, EF 20-25%.  Started on heparin 12/30 failing weaning trials.  Started on cefepime for positive UA 1/1 On PSV weans 1/4: patient extubated and developed stridor and increase wob; required reintubation; decadron and lasix given 1/6 restarted Decadron since there is no cuff leak.  Lasix dose increased  Interim History / Subjective:   Febrile, WBC count higher Weaning on pressure support  Objective   Blood pressure 105/73, pulse 97, temperature (!) 101.2 F (38.4 C), resp. rate 18, height 5\' 1"  (1.549 m), weight 60.7 kg, SpO2 100 %.    Vent Mode: PSV;CPAP FiO2 (%):  [30 %-40 %] 30 % Set Rate:  [18 bmp] 18 bmp Vt Set:  [380 mL] 380 mL PEEP:  [5 cmH20] 5 cmH20 Pressure Support:  [5 cmH20-10 cmH20] 5 cmH20 Plateau Pressure:  [16  cmH20-24 cmH20] 16 cmH20   Intake/Output Summary (Last 24 hours) at 02/24/2021 [id number] Last data filed at 02/24/2021 0600 Gross per 24 hour  Intake 1248.92 ml  Output 2500 ml  Net -1251.08 ml   Filed Weights   02/22/21 0400 02/23/21 0500 02/24/21 0413  Weight: 62.8 kg 60.5 kg 60.7 kg    Examination: Gen:      No acute distress HEENT:  EOMI, sclera anicteric Neck:     No masses; no thyromegaly ET tube Lungs:    Clear to auscultation bilaterally; normal respiratory effort CV:         Regular rate and rhythm; no murmurs Abd:      + bowel sounds; soft, non-tender; no palpable masses, no distension Ext:    No edema; adequate peripheral perfusion Skin:      Warm and dry; no rash Neuro: Somnolent but follows commands  Lab/imaging reviewed Potassium 5, BUN/creatinine 31/0.82 WBC 16.9, platelets 406 Chest x-ray with cardiomegaly, venous congestion, retrocardiac opacity  Resolved Hospital Problem list     Assessment & Plan:  CVA: basilar, bilateral PI/PCA.  TNK given in ED and she is s/p mechanical thrombectomy on the right and mechanical thrombectomy with stent placement on the L.  P: Continue statin, Brilinta  Acute resp failure, inability to protect airway due to stroke Failed extubation on 1/4 due to stridor  OSA on CPAP P: Weaning on pressure support weans but not a candidate for extubation due to mental status, absence of cuff leak and fevers Continuing  diuresis.   Starting Vanco, cefepime  Hyperkalemia, volume overload Lasix 40 mg IV every 12  Tracheal Edema/Stenosis due to endotracheal tube Give decadron 8mg  1/4 and on 1/6 P: Still does not have a cuff leak.   Chronic HFrEF: LVEF 20-25% LV thrombus P: Heparin gtt; consider changing to doac Continue midodrine Continue Lasix  E. coli UTI, MSSA pneumonia With new fevers P: Recheck x-ray, tracheal aspirate Restarting antibiotics as above  DM P: Continue SSI coverage  Goals of care Will need to discuss with  family again regarding goals of care and plan for extubation versus trach  Best Practice (right click and "Reselect all SmartList Selections" daily)   Diet/type: tubefeeds DVT prophylaxis: systemic heparin GI prophylaxis: PPI Lines: N/A Foley:  Yes, and it is still needed Code Status:  full code Last date of multidisciplinary goals of care discussion [1/6 udated brandon son over phone ]  Critical care time:   The patient is critically ill with multiple organ system failure and requires high complexity decision making for assessment and support, frequent evaluation and titration of therapies, advanced monitoring, review of radiographic studies and interpretation of complex data.   Critical Care Time devoted to patient care services, exclusive of separately billable procedures, described in this note is 110 minutes.   Marshell Garfinkel MD [facility] See Amion for pager  If no response to pager , please call [phone] until 7pm After 7:00 pm call Elink  [phone] 02/24/2021, 9:15 AM   ,

## 2021-02-24 NOTE — Progress Notes (Signed)
Pharmacy Antibiotic Note  Taylor [name] is a 56 y.o. female admitted on 03/12/21 with  new fever .  Pharmacy has been consulted for vancomycin and cefepime dosing. She was previously treated for MSSA in TA and E coli UTI.  Tm 101.2, WBC up 16.1 (had Decadron on 1/4 and 1/6), and renal function stable.  Plan: Vancomycin 1250 mg IV q24h Goal AUC 400-550, eAUC: 546, SCr used: 0.82, Vd 0.72 Cefepime 2 g IV q8h Monitor renal function, clinical progress, cultures/sensitivities F/U LOT and de-escalate as able Vancomycin levels as clinically indicated   Height: 5\' 1"  (154.9 cm) Weight: 60.7 kg (133 lb 13.1 oz) IBW/kg (Calculated) : 47.8  Temp (24hrs), Avg:99.9 F (37.7 C), Min:99.1 F (37.3 C), Max:101.2 F (38.4 C)  Recent Labs  Lab 02/20/21 0445 02/20/21 0754 02/21/21 0537 02/22/21 0355 02/23/21 0510 02/24/21 0653  WBC 14.1*  --  13.7* 14.3* 15.0* 16.1*  CREATININE  --  0.61 0.66 0.53 0.59 0.82    Estimated Creatinine Clearance: 64.1 mL/min (by C-G formula based on SCr of 0.82 mg/dL).    No Known Allergies  Antimicrobials this admission: Cefepime 12/30 >>1/1, 1/8 >> Rocephin 1/1>>1/2 Ancef 1/2 >> 1/5 Vancomycin 1/8 >>  Dose adjustments this admission:   Microbiology results: 12/27 MRSA PCR - negative 12/30 TA - MSSA 12/30 UCx - >100,000 Ecoli pan sensitive 1/8 BCx -  1/8 TA -   Thank you for involving pharmacy in this patient's care.  1/31, PharmD, BCPS Clinical Pharmacist Clinical phone for 02/24/2021 until 3p is [phone] 02/24/2021 9:23 AM  **Pharmacist phone directory can be found on [email or web address] listed under [facility]**

## 2021-02-24 NOTE — Progress Notes (Signed)
SLP Cancellation Note  Patient Details Name: Taylor [name] MRN: [id number] DOB: 27-May-1965   Cancelled treatment:       Reason Eval/Treat Not Completed: Patient not medically ready (SLP has been following pt since orders received 12/27. However, speech-language evaluation has not been completed since pt has been on the vent. SLP will sign off; please re-consult if clinically indicated.)  Yang Rack I. Vear Clock, MS, CCC-SLP Acute Rehabilitation Services Office number [phone] Pager [phone]   Scheryl Marten 02/24/2021, 2:24 PM

## 2021-02-24 NOTE — Progress Notes (Signed)
ANTICOAGULATION CONSULT NOTE - Follow Up Consult  Pharmacy Consult for Heparin Indication:  LV thrombus, s/p CVA   No Known Allergies  Patient Measurements: Height: 5\' 1"  (154.9 cm) Weight: 60.7 kg (133 lb 13.1 oz) IBW/kg (Calculated) : 47.8 Heparin Dosing Weight: 63.4 kg  Vital Signs: Temp: 99.6 F (37.6 C) (01/08 0500) Temp Source: Axillary (01/08 0500) BP: 105/73 (01/08 0743) Pulse Rate: 97 (01/08 0743)  Labs: Recent Labs    02/22/21 0355 02/23/21 0510 02/24/21 0653  HGB 13.0 12.8 12.9  HCT 42.1 41.8 43.0  PLT 373 408* 406*  HEPARINUNFRC 0.32 0.43 0.75*  CREATININE 0.53 0.59 0.82    Estimated Creatinine Clearance: 64.1 mL/min (by C-G formula based on SCr of 0.82 mg/dL).   Assessment: 38 YOF continuing heparin for new LV thrombus in setting of acute CVA. S/p mechanical thrombectomy and TNK.   Heparin level this morning is 0.75 and supratherapeutic. No bleeding reported. CBC WNL/stable. Heparin level drawn on left where infusing which may affect result - rechecked from right and heparin level 0.89 and supratherapeutic for unknown reason.  Goal of Therapy:  Heparin level 0.3-0.5 units/ml Monitor platelets by anticoagulation protocol: Yes   Plan:  Hold heparin infusion for 30 min and resume at 1050 units/hr 6h heparin level Monitor heparin level and CBC daily Monitor for s/sx of bleeding F/u for long-term anticoagulation plan s/p extubation  Thank you for involving pharmacy in this patient's care.  Renold Genta, PharmD, BCPS Clinical Pharmacist Clinical phone for 02/24/2021 until 3p is [phone] 02/24/2021 8:38 AM  **Pharmacist phone directory can be found on [email or web address] listed under [facility]**

## 2021-02-24 NOTE — Progress Notes (Signed)
ANTICOAGULATION CONSULT NOTE - Follow Up Consult  Pharmacy Consult for Heparin Indication:  LV thrombus, s/p CVA   No Known Allergies  Patient Measurements: Height: 5\' 1"  (154.9 cm) Weight: 60.7 kg (133 lb 13.1 oz) IBW/kg (Calculated) : 47.8 Heparin Dosing Weight: 63.4 kg  Vital Signs: Temp: 98.5 F (36.9 C) (01/08 2000) Temp Source: Axillary (01/08 2000) BP: 100/69 (01/08 2100) Pulse Rate: 85 (01/08 2100)  Labs: Recent Labs    02/22/21 0355 02/23/21 0510 02/24/21 0653 02/24/21 1001 02/24/21 2010  HGB 13.0 12.8 12.9  --   --   HCT 42.1 41.8 43.0  --   --   PLT 373 408* 406*  --   --   HEPARINUNFRC 0.32 0.43 0.75* 0.89* 0.46  CREATININE 0.53 0.59 0.82  --   --     Estimated Creatinine Clearance: 64.1 mL/min (by C-G formula based on SCr of 0.82 mg/dL).   Assessment: 51 YOF continuing heparin for new LV thrombus in setting of acute CVA. S/p mechanical thrombectomy and TNK.   Heparin level this evening is at goal (0.4). No bleeding reported. CBC WNL/stable.   Goal of Therapy:  Heparin level 0.3-0.5 units/ml Monitor platelets by anticoagulation protocol: Yes   Plan:  Continue heparin at 1050 units/hr Monitor heparin level and CBC daily Monitor for s/sx of bleeding F/u for long-term anticoagulation plan s/p extubation  Thank you for involving pharmacy in this patient's care.  Erin Hearing PharmD., BCPS Clinical Pharmacist 02/24/2021 9:50 PM

## 2021-02-25 DIAGNOSIS — I639 Cerebral infarction, unspecified: Secondary | ICD-10-CM | POA: Diagnosis not present

## 2021-02-25 DIAGNOSIS — J9601 Acute respiratory failure with hypoxia: Secondary | ICD-10-CM | POA: Diagnosis not present

## 2021-02-25 DIAGNOSIS — Z978 Presence of other specified devices: Secondary | ICD-10-CM | POA: Diagnosis not present

## 2021-02-25 DIAGNOSIS — I5020 Unspecified systolic (congestive) heart failure: Secondary | ICD-10-CM | POA: Diagnosis not present

## 2021-02-25 LAB — CBC
HCT: 44.5 % (ref 36.0–46.0)
Hemoglobin: 13.4 g/dL (ref 12.0–15.0)
MCH: 28.2 pg (ref 26.0–34.0)
MCHC: 30.1 g/dL (ref 30.0–36.0)
MCV: 93.5 fL (ref 80.0–100.0)
Platelets: 358 10*3/uL (ref 150–400)
RBC: 4.76 MIL/uL (ref 3.87–5.11)
RDW: 16.7 % — ABNORMAL HIGH (ref 11.5–15.5)
WBC: 18 10*3/uL — ABNORMAL HIGH (ref 4.0–10.5)
nRBC: 0 % (ref 0.0–0.2)

## 2021-02-25 LAB — GLUCOSE, CAPILLARY
Glucose-Capillary: 173 mg/dL — ABNORMAL HIGH (ref 70–99)
Glucose-Capillary: 168 mg/dL — ABNORMAL HIGH (ref 70–99)
Glucose-Capillary: 234 mg/dL — ABNORMAL HIGH (ref 70–99)
Glucose-Capillary: 240 mg/dL — ABNORMAL HIGH (ref 70–99)
Glucose-Capillary: 221 mg/dL — ABNORMAL HIGH (ref 70–99)
Glucose-Capillary: 217 mg/dL — ABNORMAL HIGH (ref 70–99)

## 2021-02-25 LAB — BASIC METABOLIC PANEL
Anion gap: 9 (ref 5–15)
BUN: 33 mg/dL — ABNORMAL HIGH (ref 6–20)
CO2: 36 mmol/L — ABNORMAL HIGH (ref 22–32)
Calcium: 8.7 mg/dL — ABNORMAL LOW (ref 8.9–10.3)
Chloride: 94 mmol/L — ABNORMAL LOW (ref 98–111)
Creatinine, Ser: 0.76 mg/dL (ref 0.44–1.00)
GFR, Estimated: >60 mL/min (ref >60)
Glucose, Bld: 184 mg/dL — ABNORMAL HIGH (ref 70–99)
Potassium: 4.2 mmol/L (ref 3.5–5.1)
Sodium: 139 mmol/L (ref 135–145)

## 2021-02-25 LAB — HEPARIN LEVEL (UNFRACTIONATED): Heparin Unfractionated: 0.31 IU/mL (ref 0.30–0.70)

## 2021-02-25 MED ORDER — SODIUM CHLORIDE 0.9 % IV SOLN
2.0000 g | Freq: Three times a day (TID) | INTRAVENOUS | Status: DC
  Administered 2021-02-25 – 2021-02-26 (×3): 2 g via INTRAVENOUS
  Filled 2021-02-25 (×3): qty 2

## 2021-02-25 NOTE — Progress Notes (Signed)
NAME:  Taylor [name], MRN:  [id number], DOB:  Oct 06, 1965, LOS: 13 ADMISSION DATE:  02-16-21, CONSULTATION DATE:  12/27 REFERRING MD:  Dr Tommi Rumps Melchor Amour , CHIEF COMPLAINT:   CVA  History of Present Illness:  56 year old female with PMH as below, which is significant for HFrEF with LVEF 20-25 % (2017), DM, and OSA on CPAP. 12/27 she presented to [facility] ED with complaints of acute onset left sided weakness and slurred speech. EMS noted non-verbal and right gaze preference. Imaging consistent with occlusion of the distal basilar arterty, right P1, and proximal R P2. She was given systemic TNK and mechanical thrombectomy  with stent retriever on the right with complete recanalization after one pass (TICI3). PCCM consulted for vent management and ICU care.    Pertinent  Medical History   has a past medical history of CHF (congestive heart failure) ([facility]), Diabetes mellitus without complication ([facility]), and Obstructive sleep apnea.   Significant Hospital Events: Including procedures, antibiotic start and stop dates in addition to other pertinent events   12/27 admit for posterior circulation stroke. TNK given and IR throbmectomy 12/28 echo with LV thrombus, EF 20-25%.  Started on heparin 12/30 failing weaning trials.  Started on cefepime for positive UA 1/1 On PSV weans 1/4: patient extubated and developed stridor and increase wob; required reintubation; decadron and lasix given 1/6 restarted Decadron since there is no cuff leak.  Lasix dose increased  Interim History / Subjective:   No overnight events.  Objective   Blood pressure (!) 92/59, pulse 88, temperature 98.9 F (37.2 C), temperature source Axillary, resp. rate (!) 23, height 5\' 1"  (1.549 m), weight 60.2 kg, SpO2 99 %.    Vent Mode: PRVC FiO2 (%):  [30 %] 30 % Set Rate:  [18 bmp] 18 bmp Vt Set:  [380 mL] 380 mL PEEP:  [5 cmH20] 5 cmH20 Plateau Pressure:  [12 cmH20-19 cmH20] 17 cmH20   Intake/Output Summary (Last 24  hours) at 02/25/2021 1241 Last data filed at 02/25/2021 0919 Gross per 24 hour  Intake 1915.4 ml  Output 2700 ml  Net -784.6 ml    Filed Weights   02/23/21 0500 02/24/21 0413 02/25/21 0500  Weight: 60.5 kg 60.7 kg 60.2 kg    Examination: Gen:      No acute distress, frail, appears malnourished.  HEENT:  EOMI, sclera anicteric Neck:     No masses; no thyromegaly ET tube Lungs:    Clear to auscultation bilaterally; normal respiratory effort CV:         Regular rate and rhythm; no murmurs Abd:      + bowel sounds; soft, non-tender; no palpable masses, no distension Ext:    No edema; adequate peripheral perfusion Skin:      Warm and dry; no rash Neuro:   Somnolent, not following commands for me  Ancillary tests personally reviewed:   WBC: 18  CO2 36  Assessment & Plan:  CVA: basilar, bilateral PI/PCA.  TNK given in ED and she is s/p mechanical thrombectomy on the right and mechanical thrombectomy with stent placement on the L.  P: Continue statin, Brilinta  Acute resp failure, inability to protect airway due to stroke Failed extubation on 1/4 due to stridor  OSA on CPAP P: Weaning on pressure support weans but not a candidate for extubation due to mental status, absence of cuff leak and fevers Continuing diuresis.   Starting Vanco, cefepime  Hyperkalemia, volume overload Lasix 40 mg IV every 12  Tracheal Edema/Stenosis due to endotracheal tube Give decadron 8mg  1/4 and on 1/6 P: Still does not have a cuff leak.  Awaiting family answer regarding tracheostomy  Chronic HFrEF: LVEF 20-25% LV thrombus P: Heparin gtt; consider changing to doac Continue midodrine Stop furosemide given worsening alkalosis.  E. coli UTI, MSSA pneumonia With new fevers P: Have stopped antibiotics given negative cultures  DM P: Continue SSI coverage  Goals of care Will need to discuss with family again regarding goals of care and plan for extubation versus trach  Best Practice  (right click and "Reselect all SmartList Selections" daily)   Diet/type: tubefeeds DVT prophylaxis: systemic heparin GI prophylaxis: PPI Lines: N/A Foley:  Yes, and it is still needed Code Status:  full code Last date of multidisciplinary goals of care discussion [1/6 udated brandon son over phone ]  Critical care time:   The patient is critically ill with multiple organ system failure and requires high complexity decision making for assessment and support, frequent evaluation and titration of therapies, advanced monitoring, review of radiographic studies and interpretation of complex data.   Critical Care Time devoted to patient care services, exclusive of separately billable procedures, described in this note is 35 minutes.   09-22-1988, MD [facility] ICU Physician [facility] [place] [facility]  Pager: [phone] Or Epic Secure Chat After hours: [phone].  02/25/2021, 1:03 PM      If no response to pager , please call [phone] until 7pm After 7:00 pm call Elink  [phone] 02/25/2021, 12:41 PM   ,

## 2021-02-25 NOTE — Progress Notes (Signed)
Occupational Therapy Treatment Patient Details Name: Taylor [name] MRN: [id number] DOB: Jul 23, 1965 Today's Date: 02/25/2021   History of present illness 56 y/o female presented to ED on 12/27 for L sided paralysis, R gaze, slurred speech, and L facial droop. TNK given. S/p mechanical thrombectomy of bilateral occluded P1/PCA. MRI showed acute R PCA infarcts including infarcts in the R thalamus and R occipital lobe with mild mass effect with partial effacement of 3rd ventricle, numerous small acute infarcts in bilateral cerebellar hemispheres and few scattered small acute infarcts in bilateral parietal lobes, L occipital and L frontal lobes. Intubated 12/27. PMH: CHF, sleep apnea, type 2 DM   OT comments  Pt with increased arousal sitting up and following all commands. Pt could benefit possibly from medications to help with arousal. Pt with decreased BP and requires SCD the entire session with map 67-69 range. OT to continue to follow acutely.    Recommendations for follow up therapy are one component of a multi-disciplinary discharge planning process, led by the attending physician.  Recommendations may be updated based on patient status, additional functional criteria and insurance authorization.    Follow Up Recommendations  Acute inpatient rehab (3hours/day)    Assistance Recommended at Discharge Frequent or constant Supervision/Assistance  Patient can return home with the following      Equipment Recommendations  Tub/shower seat;Wheelchair cushion (measurements OT);Wheelchair (measurements OT)    Recommendations for Other Services      Precautions / Restrictions Precautions Precautions: Fall Precaution Comments: vent foley and cortrak       Mobility Bed Mobility Overal bed mobility: Needs Assistance Bed Mobility: Supine to Sit Rolling: Total assist   Supine to sit: Total assist;+2 for physical assistance Sit to supine: Total assist;+2 for physical assistance   General bed  mobility comments: pt placed at EOB and increased arousal    Transfers                   General transfer comment: no tested. recommend hoyer lift     Balance                                           ADL either performed or assessed with clinical judgement   ADL Overall ADL's : Needs assistance/impaired Eating/Feeding: NPO                                     General ADL Comments: total (A) for all care. pt does reach for wash cloth. cloth placed on face and pt holding hand up and moving digits. pt unable to complete task without (A) to sustain and provide enough detail to task    Extremity/Trunk Assessment Upper Extremity Assessment Upper Extremity Assessment: LUE deficits/detail LUE Deficits / Details: no trace movement at this time   Lower Extremity Assessment Lower Extremity Assessment: Defer to PT evaluation        Vision       Perception     Praxis      Cognition Arousal/Alertness: Awake/alert Behavior During Therapy: Flat affect Overall Cognitive Status: Difficult to assess                                 General Comments: following commands thumbs up, 2  fingers, opening mouth wider when asked for suction, kicking R LE, holding R LE again gravity with knee flexion unable to sustain, Reaching with R UE, attempting to wash face,waving bye to sponse          Exercises Other Exercises Other Exercises: 83/59 BP in supine and 86/61 ( 68) end of session 98/69 (79)   Shoulder Instructions       General Comments Vent support    Pertinent Vitals/ Pain       Pain Assessment: No/denies pain Facial Expression: Relaxed, neutral Body Movements: Absence of movements Muscle Tension: Relaxed Compliance with ventilator (intubated pts.): Tolerating ventilator or movement Vocalization (extubated pts.): N/A CPOT Total: 0 Pain Intervention(s): Monitored during session  Home Living                                           Prior Functioning/Environment              Frequency  Min 2X/week        Progress Toward Goals  OT Goals(current goals can now be found in the care plan section)  Progress towards OT goals: Progressing toward goals  Acute Rehab OT Goals Patient Stated Goal: none stated OT Goal Formulation: Patient unable to participate in goal setting Time For Goal Achievement: 02/28/21 Potential to Achieve Goals: Fair ADL Goals Pt Will Perform Grooming: with min assist;sitting Pt Will Perform Upper Body Bathing: with mod assist;sitting Pt Will Perform Upper Body Dressing: with mod assist;sitting Additional ADL Goal #1: Tolerate sitting edge of bed for up to 5 min with min A for balance to increase upper body ADL independence.  Plan Discharge plan remains appropriate    Co-evaluation    PT/OT/SLP Co-Evaluation/Treatment: Yes Reason for Co-Treatment: Complexity of the patient's impairments (multi-system involvement);Necessary to address cognition/behavior during functional activity;For patient/therapist safety;To address functional/ADL transfers   OT goals addressed during session: ADL's and self-care;Proper use of Adaptive equipment and DME;Strengthening/ROM      AM-PAC OT "6 Clicks" Daily Activity     Outcome Measure   Help from another person eating meals?: Total Help from another person taking care of personal grooming?: A Lot Help from another person toileting, which includes using toliet, bedpan, or urinal?: Total Help from another person bathing (including washing, rinsing, drying)?: Total Help from another person to put on and taking off regular upper body clothing?: Total Help from another person to put on and taking off regular lower body clothing?: Total 6 Click Score: 7    End of Session Equipment Utilized During Treatment: Oxygen  OT Visit Diagnosis: Muscle weakness (generalized) (M62.81);Other symptoms and signs involving cognitive  function;Hemiplegia and hemiparesis Hemiplegia - Right/Left: Left Hemiplegia - dominant/non-dominant: Non-Dominant Hemiplegia - caused by: Cerebral infarction   Activity Tolerance Patient tolerated treatment well   Patient Left in bed;with call bell/phone within reach;with bed alarm set;with family/visitor present   Nurse Communication Mobility status;Need for lift equipment;Precautions        Time: 1343-1405 OT Time Calculation (min): 22 min  Charges: OT General Charges $OT Visit: 1 Visit OT Treatments $Self Care/Home Management : 8-22 mins   Brynn, OTR/L  Acute Rehabilitation Services Pager: [phone] Office: [phone] .   Mateo Flow 02/25/2021, 2:31 PM

## 2021-02-25 NOTE — Progress Notes (Signed)
Physical Therapy Treatment Patient Details Name: Taylor [name] MRN: [id number] DOB: Aug 28, 1965 Today's Date: 02/25/2021   History of Present Illness 56 y/o female presented to ED on 12/27 for L sided paralysis, R gaze, slurred speech, and L facial droop. TNK given. S/p mechanical thrombectomy of bilateral occluded P1/PCA. MRI showed acute R PCA infarcts including infarcts in the R thalamus and R occipital lobe with mild mass effect with partial effacement of 3rd ventricle, numerous small acute infarcts in bilateral cerebellar hemispheres and few scattered small acute infarcts in bilateral parietal lobes, L occipital and L frontal lobes. Intubated 12/27. PMH: CHF, sleep apnea, type 2 DM    PT Comments    Pt seen with OT. Per charts pt with cognitive regression however once pt transferred to EOB pt participated well in therapy. Pt actively moved R UE and LE and followed commands majority of time. Pt tolerated EOB sitting x 10 min with total posterior support. BP noted to be soft (see mobility part of note) however asymptomatic and SCDs kept on while at EOB. Acute PT to cont to follow.    Recommendations for follow up therapy are one component of a multi-disciplinary discharge planning process, led by the attending physician.  Recommendations may be updated based on patient status, additional functional criteria and insurance authorization.  Follow Up Recommendations  Acute inpatient rehab (3hours/day)     Assistance Recommended at Discharge Frequent or constant Supervision/Assistance  Patient can return home with the following Two people to help with bathing/dressing/bathroom;Two people to help with walking and/or transfers;Assistance with cooking/housework;Assistance with feeding;Direct supervision/assist for medications management;Direct supervision/assist for financial management   Equipment Recommendations  Other (comment) (TBD)    Recommendations for Other Services Rehab consult      Precautions / Restrictions Precautions Precautions: Fall Precaution Comments: vent foley and cortrak Restrictions Weight Bearing Restrictions: No     Mobility  Bed Mobility Overal bed mobility: Needs Assistance Bed Mobility: Supine to Sit Rolling: Total assist   Supine to sit: Total assist;+2 for physical assistance Sit to supine: Total assist;+2 for physical assistance   General bed mobility comments: pt placed at EOB and increased arousal, BP 83/59 with SCDs at EOB    Transfers                   General transfer comment: no tested. recommend hoyer lift    Ambulation/Gait               General Gait Details: unable at this time   Social research officer, government Rankin (Stroke Patients Only) Modified Rankin (Stroke Patients Only) Pre-Morbid Rankin Score: No significant disability Modified Rankin: Severe disability     Balance Overall balance assessment: Needs assistance Sitting-balance support: Feet supported;No upper extremity supported Sitting balance-Leahy Scale: Poor Sitting balance - Comments: patient unable to to maintain sit balance without external support. Pushing with R UE at times Postural control: Left lateral lean;Posterior lean Standing balance support: No upper extremity supported Standing balance-Leahy Scale: Poor Standing balance comment: NT                            Cognition Arousal/Alertness: Awake/alert Behavior During Therapy: Flat affect Overall Cognitive Status: Difficult to assess  General Comments: following simple commands majority of time, more alert/awake once EOB, pt able to give thumbs up, show 2 fingers, and initiate high five to command in addition to working with R UE and LE in exercises        Exercises General Exercises - Lower Extremity Ankle Circles/Pumps: Both;5 reps;Seated;AAROM Long Arc Quad: AROM;Right;AAROM;Left;5  reps;Seated Other Exercises Other Exercises: 83/59 BP in supine and 86/61 ( 68) end of session 98/69 (79) Other Exercises: worked on correcting to midline sitting EOB posture as pt with L lateral lean, continues to require mod/maxA    General Comments General comments (skin integrity, edema, etc.): vent support      Pertinent Vitals/Pain Pain Assessment: No/denies pain Facial Expression: Relaxed, neutral Body Movements: Absence of movements Muscle Tension: Relaxed Compliance with ventilator (intubated pts.): Tolerating ventilator or movement Vocalization (extubated pts.): N/A CPOT Total: 0 Pain Intervention(s): Monitored during session    Home Living                          Prior Function            PT Goals (current goals can now be found in the care plan section) Acute Rehab PT Goals Patient Stated Goal: unable to state PT Goal Formulation: Patient unable to participate in goal setting Time For Goal Achievement: 02/28/21 Potential to Achieve Goals: Fair Progress towards PT goals: Progressing toward goals    Frequency    Min 3X/week      PT Plan Current plan remains appropriate    Co-evaluation PT/OT/SLP Co-Evaluation/Treatment: Yes Reason for Co-Treatment: Complexity of the patient's impairments (multi-system involvement) PT goals addressed during session: Mobility/safety with mobility OT goals addressed during session: ADL's and self-care;Proper use of Adaptive equipment and DME;Strengthening/ROM      AM-PAC PT "6 Clicks" Mobility   Outcome Measure  Help needed turning from your back to your side while in a flat bed without using bedrails?: Total Help needed moving from lying on your back to sitting on the side of a flat bed without using bedrails?: Total Help needed moving to and from a bed to a chair (including a wheelchair)?: Total Help needed standing up from a chair using your arms (e.g., wheelchair or bedside chair)?: Total Help needed to  walk in hospital room?: Total Help needed climbing 3-5 steps with a railing? : Total 6 Click Score: 6    End of Session Equipment Utilized During Treatment: Oxygen Activity Tolerance: Patient tolerated treatment well Patient left: in bed;with call bell/phone within reach Nurse Communication: Mobility status PT Visit Diagnosis: Unsteadiness on feet (R26.81);Muscle weakness (generalized) (M62.81);Difficulty in walking, not elsewhere classified (R26.2);Other symptoms and signs involving the nervous system (R29.898)     Time: 8502-7741 PT Time Calculation (min) (ACUTE ONLY): 23 min  Charges:  $Therapeutic Activity: 8-22 mins                     Lewis Shock, PT, DPT Acute Rehabilitation Services Pager #: [phone] Office #: [phone]    Iona Hansen 02/25/2021, 2:50 PM

## 2021-02-25 NOTE — Progress Notes (Addendum)
STROKE TEAM PROGRESS NOTE   INTERVAL HISTORY:  No family at the bedside. Patient is still intubated,  no cuff leak today. Consider tracheostomy this week. BP stable, HR 104s, still lethargic but no neurological change.  Afebrile today but WBC 18.0, resp culture, blood cultures sent on 1/8. Vanco and  cefepime started. Steroid course completed 02/23/2021. Patient was unable to wean this morning. Continue conversation about tracheostomy with family.   Vitals:   02/25/21 0700 02/25/21 0800 02/25/21 0900 02/25/21 0927  BP: 105/74 103/76 105/71 105/71  Pulse: 96 97 96 100  Resp: 18 20 19  (!) 24  Temp:  98.2 F (36.8 C)    TempSrc:  Axillary    SpO2: 98% 99% 99% 100%  Weight:      Height:       CBC:  Recent Labs  Lab 02/24/21 0653 02/25/21 0637  WBC 16.1* 18.0*  HGB 12.9 13.4  HCT 43.0 44.5  MCV 93.9 93.5  PLT 406* 358    Basic Metabolic Panel:  Recent Labs  Lab 02/24/21 0653 02/25/21 0637  NA 139 139  K 5.0 4.2  CL 93* 94*  CO2 36* 36*  GLUCOSE 180* 184*  BUN 31* 33*  CREATININE 0.82 0.76  CALCIUM 8.9 8.7*    Lipid Panel:  No results for input(s): CHOL, TRIG, HDL, CHOLHDL, VLDL, LDLCALC in the last 168 hours.  HgbA1c:  No results for input(s): HGBA1C in the last 168 hours.  Urine Drug Screen: No results for input(s): LABOPIA, COCAINSCRNUR, LABBENZ, AMPHETMU, THCU, LABBARB in the last 168 hours.  Alcohol Level  No results for input(s): ETH in the last 168 hours.   IMAGING past 24 hours No results found.  PHYSICAL EXAM  Temp:  [98.2 F (36.8 C)-100.7 F (38.2 C)] 98.2 F (36.8 C) (01/09 0800) Pulse Rate:  [77-111] 100 (01/09 0927) Resp:  [18-30] 24 (01/09 0927) BP: (98-121)/(65-85) 105/71 (01/09 0927) SpO2:  [93 %-100 %] 100 % (01/09 0927) FiO2 (%):  [30 %] 30 % (01/09 0927) Weight:  [60.2 kg] 60.2 kg (01/09 0500)  General - [name], intubated off sedation.  Cardiovascular - Regular rhythm but tachycardia.  Neuro - intubated  off sedation, awake alert but very lethargic, follows most commands but less active.  Right gaze preference barely cross midline.  PERRL, horizontal nystagmus present on left gaze.  Not blink to threat on left side. RUE 2/5 proximal and 3/5 distal and 2/5 RLE today, and 0/5 in LUE and LLE today. Sensation, coordination and gait not tested.   ASSESSMENT/PLAN [name] is a 56 y.o. female with history of CHF, T2DM and OSA presenting with acute onset left-sided weakness, right gaze deviation and slurred speech. She was found to have occlusion of the distal basilar artery, right P1 and proximal right P2.  She was given TNK and received mechanical thrombectomy to the right and left PCA with stenting on the left. She was found to have two apical thrombi on her 2D echocardiogram and remains on IV heparin. Extubated and then emergently reintubated on 02/20/2021.  She weaned on pressure support 5/5 with an FiO2 30% on 1/8, however unable to wean in the morning on 1/9.  CCM and steroid and diuretic course completed 02/23/2021 for swollen airway. WBC 18.0, respiratory, blood, and urine cultures sent. Vanco and cefepime started 02/24/2021.  Stroke:  embolic shower with b/l PCA occlusion s/p IR with TICI3 and left PCA stenting, likely secondary due to embolism from low EF and  apical thrombi Code Stroke CT head No acute abnormality.  ASPECTS 10.    CTA head & neck nonopacification of distal basilar artery, right P1 and P2, poor opacification of right vertebral artery, focal stenosis in right A1 and A3 IR with b/l P1 occlusion s/p thrombectomy with TICI3 and left PCA stent MRI  Acute right PCA territory infarct, in right thalamus and occipital lobe, small acute infarcts in bilateral cerebellar hemispheres with scattered small acute infarcts in bilateral parietal lobes, left occipital and left frontal lobes, area of susceptibility infarct in right occipital lobe suggestive of petechial hemorrhage MRA thrombectomy and  placement of left P1/P2 stent with good flow related signal in distal left PCA, poor flow signal in distal basilar tip, poor opacification of right vertebral artery, right A1 and A3 ACA stenosis 2D Echo global hypokinesis with inferior akinesis, EF 0000000, grade 3 diastolic dysfunction, thrombi in inferoapical and anteroapical areas, no atrial level shunt LDL 151 HgbA1c 11.9 VTE prophylaxis -Heparin IV aspirin 81 mg daily prior to admission, Now on heparin IV and Brilinta.  Transition to PO AC once extubated with p.o. access. 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% Two apical thrombi present Now on heparin IV and Brilinta  Respiratory failure Currently intubated and ventilated Management per CCM Reintubated 1/4 for not tolerating extubation Still no cuff leak today- 1/8 Vocal cord edema- steroids completed Continue diuresis today Continue conversation regarding trach vs terminal extubation  Hx of hypertension Hypotension Home meds:  lisinopril 2.5 mg daily Stable on the low end SBP 90-100s Off Levophed On midodrine 10 mg every 8h Long-term BP goal normotensive  Fever Leukocytosis UTI WBC 10.8-> 11.9->15.3-> 13.4-> 13.7->15.0 -> 16.1-> 18.0 T-max 100.6->99.1 ax. UA WBC > 50 Urine culture Gram neg rods. 02/17/21 E Coli Sensitive to all antibiotics tested On cefepime -completed course Management per CCM Now on vanco and cefepime 02/24/2021  Hyperlipidemia Home meds:  atorvastatin 40 mg daily, resumed in hospital LDL 151, goal < 70 Increase to 80 mg atorvastatin daily  Continue statin at discharge  Diabetes type II Uncontrolled Home meds:  Jardiance 25 mg daily, levemir 17 units BID, metformin 1000 mg BID HgbA1c 11.9, goal < 7.0 CBGs Diabetes coordinator consult SSI- 0-20u q4  Other Stroke Risk Factors Former cigarette smoker Obstructive sleep apnea  Other Active Problems Hyperkalemia -  5.3 - > 5.0-> 4.2 (resolved) Dysphagia on tube feeding @ [facility] day # 13  Patient seen and examined by NP/APP with MD. MD to update note as needed.   Taylor Ores, DNP, FNP-BC [facility] Pager: [phone]  ATTENDING NOTE: I reviewed above note and agree with the assessment and plan. Pt was seen and examined.   No family at bedside.  Patient still intubated, without cuff leak.  Not a candidate for extubation.  However, she is more awake alert than yesterday, open eyes, follow commands on the right, more stress on the right than yesterday.  Discussed with her about tracheostomy, she showed right thumbs up indicating she is okay with tracheostomy.  We will have further discussion with CCM and the family regarding tracheostomy.  Continue heparin and Brilinta at this time.  For detailed assessment and plan, please refer to above as I have made changes wherever appropriate.   Rosalin Hawking, MD PhD Stroke Neurology 02/25/2021 7:25 PM  This patient is critically ill due to embolic stroke, LV thrombus, CHF with low EF, respiratory failure, carotid stenosis  status post stenting and at significant risk of neurological worsening, death form heart failure, sepsis, respiratory failure, recurrent stroke, hemorrhagic transformation. This patient's care requires constant monitoring of vital signs, hemodynamics, respiratory and cardiac monitoring, review of multiple databases, neurological assessment, discussion with family, other specialists and medical decision making of high complexity. I spent 35 minutes of neurocritical care time in the care of this patient.      To contact Stroke Continuity provider, please refer to [email or web address] After hours, contact General Neurology

## 2021-02-25 NOTE — Progress Notes (Signed)
ANTICOAGULATION CONSULT NOTE  Pharmacy Consult for Heparin Indication:  LV thrombus, s/p CVA   No Known Allergies  Patient Measurements: Height: 5\' 1"  (154.9 cm) Weight: 60.2 kg (132 lb 11.5 oz) IBW/kg (Calculated) : 47.8 Heparin Dosing Weight: 63.4 kg  Vital Signs: Temp: 98.2 F (36.8 C) (01/09 0800) Temp Source: Axillary (01/09 0800) BP: 105/71 (01/09 0927) Pulse Rate: 100 (01/09 0927)  Labs: Recent Labs    02/23/21 0510 02/24/21 0653 02/24/21 1001 02/24/21 2010 02/25/21 0420 02/25/21 0637  HGB 12.8 12.9  --   --   --  13.4  HCT 41.8 43.0  --   --   --  44.5  PLT 408* 406*  --   --   --  358  HEPARINUNFRC 0.43 0.75* 0.89* 0.46 0.31  --   CREATININE 0.59 0.82  --   --   --  0.76    Estimated Creatinine Clearance: 65.5 mL/min (by C-G formula based on SCr of 0.76 mg/dL).   Assessment: Taylor [name] continuing heparin for new LV thrombus in setting of acute CVA. S/p mechanical thrombectomy and TNK.   Heparin level therapeutic and low normal.  Heparin to be on hold for 2 hours per CCM due to bleeding around foley.  CBC stable  Goal of Therapy:  Heparin level 0.3-0.5 units/ml Monitor platelets by anticoagulation protocol: Yes   Plan:  When ready, resume heparin gtt at 1050 units/hr Monitor heparin level and CBC daily Monitor for s/sx of bleeding  Shray Hunley D. Mina Marble, PharmD, BCPS, [place] 02/25/2021, 10:30 AM

## 2021-02-26 DIAGNOSIS — I639 Cerebral infarction, unspecified: Secondary | ICD-10-CM | POA: Diagnosis not present

## 2021-02-26 DIAGNOSIS — Z978 Presence of other specified devices: Secondary | ICD-10-CM | POA: Diagnosis not present

## 2021-02-26 DIAGNOSIS — I5020 Unspecified systolic (congestive) heart failure: Secondary | ICD-10-CM | POA: Diagnosis not present

## 2021-02-26 DIAGNOSIS — J9601 Acute respiratory failure with hypoxia: Secondary | ICD-10-CM | POA: Diagnosis not present

## 2021-02-26 DIAGNOSIS — E1159 Type 2 diabetes mellitus with other circulatory complications: Secondary | ICD-10-CM | POA: Diagnosis not present

## 2021-02-26 LAB — BLOOD CULTURE ID PANEL (REFLEXED) - BCID2

## 2021-02-26 LAB — CULTURE, RESPIRATORY W GRAM STAIN

## 2021-02-26 LAB — CBC
HCT: 44.6 % (ref 36.0–46.0)
Hemoglobin: 13.4 g/dL (ref 12.0–15.0)
MCH: 28.2 pg (ref 26.0–34.0)
MCHC: 30 g/dL (ref 30.0–36.0)
MCV: 93.7 fL (ref 80.0–100.0)
Platelets: 342 10*3/uL (ref 150–400)
RBC: 4.76 MIL/uL (ref 3.87–5.11)
RDW: 17 % — ABNORMAL HIGH (ref 11.5–15.5)
WBC: 15.2 10*3/uL — ABNORMAL HIGH (ref 4.0–10.5)
nRBC: 0 % (ref 0.0–0.2)

## 2021-02-26 LAB — BASIC METABOLIC PANEL
Anion gap: 11 (ref 5–15)
BUN: 33 mg/dL — ABNORMAL HIGH (ref 6–20)
CO2: 31 mmol/L (ref 22–32)
Calcium: 8.4 mg/dL — ABNORMAL LOW (ref 8.9–10.3)
Chloride: 98 mmol/L (ref 98–111)
Creatinine, Ser: 0.78 mg/dL (ref 0.44–1.00)
GFR, Estimated: >60 mL/min (ref >60)
Glucose, Bld: 191 mg/dL — ABNORMAL HIGH (ref 70–99)
Potassium: 4.4 mmol/L (ref 3.5–5.1)
Sodium: 140 mmol/L (ref 135–145)

## 2021-02-26 LAB — GLUCOSE, CAPILLARY
Glucose-Capillary: 202 mg/dL — ABNORMAL HIGH (ref 70–99)
Glucose-Capillary: 126 mg/dL — ABNORMAL HIGH (ref 70–99)
Glucose-Capillary: 190 mg/dL — ABNORMAL HIGH (ref 70–99)
Glucose-Capillary: 182 mg/dL — ABNORMAL HIGH (ref 70–99)
Glucose-Capillary: 203 mg/dL — ABNORMAL HIGH (ref 70–99)
Glucose-Capillary: 240 mg/dL — ABNORMAL HIGH (ref 70–99)

## 2021-02-26 LAB — HEPARIN LEVEL (UNFRACTIONATED): Heparin Unfractionated: 0.55 IU/mL (ref 0.30–0.70)

## 2021-02-26 MED ORDER — BETHANECHOL CHLORIDE 10 MG PO TABS
10.0000 mg | ORAL_TABLET | Freq: Three times a day (TID) | ORAL | Status: DC
  Filled 2021-02-26: qty 1

## 2021-02-26 MED ORDER — CEFAZOLIN SODIUM-DEXTROSE 2-4 GM/100ML-% IV SOLN
2.0000 g | Freq: Three times a day (TID) | INTRAVENOUS | Status: DC
  Administered 2021-02-26 – 2021-03-02 (×13): 2 g via INTRAVENOUS
  Filled 2021-02-26 (×13): qty 100

## 2021-02-26 MED ORDER — BETHANECHOL CHLORIDE 10 MG PO TABS
10.0000 mg | ORAL_TABLET | Freq: Three times a day (TID) | ORAL | Status: DC
  Administered 2021-02-26: 10 mg

## 2021-02-26 NOTE — Progress Notes (Signed)
Nutrition Follow-up  DOCUMENTATION CODES:   Not applicable  INTERVENTION:   Tube feeding via cortrak tube: Glucerna 1.5 at 45 ml/h (1080 ml per day) Prosource TF 45 ml daily  Provides 1660 kcal, 100 gm protein, 820 ml free water daily  MVI with minerals daily   NUTRITION DIAGNOSIS:   Inadequate oral intake related to inability to eat as evidenced by NPO status. Ongoing.   GOAL:   Patient will meet greater than or equal to 90% of their needs Met with TF  MONITOR:   TF tolerance  REASON FOR ASSESSMENT:   Consult Enteral/tube feeding initiation and management  ASSESSMENT:   Pt with PMH of HF with EF 20-25%, uncontrolled DM, OSA on CPAP admitted with bil basilar stroke s/p TNK and IR.   Pt discussed during ICU rounds and with RN. Family care meeting for goals of care  12/27 s/p TNK and IR 12/28 started TF 12/30 s/p cortrak placement; tip in distal stomach  1/2 given imodium   Patient is currently intubated on ventilator support MV: 9.1 L/min Temp (24hrs), Avg:99.5 F (37.5 C), Min:98.4 F (36.9 C), Max:100 F (37.8 C)   Medications reviewed and include: SSI, MVI with minerals daily, protonix  Labs reviewed: A1C: 11.9 CBG's: 126-202    Diet Order:   Diet Order             Diet NPO time specified  Diet effective now                   EDUCATION NEEDS:   No education needs have been identified at this time  Skin:  Skin Assessment:  (skin tear perineum)  Last BM:  1/9  Height:   Ht Readings from Last 1 Encounters:  02/13/2021 _0  (1.549 m)    Weight:   Wt Readings from Last 1 Encounters:  02/26/21 60.2 kg    Ideal Body Weight:     BMI:  Body mass index is 25.08 kg/m.  Estimated Nutritional Needs:   Kcal:  1600  Protein:  85-100 grams  Fluid:  > 1.6 L/day  Lockie Pares., RD, LDN, CNSC See AMiON for contact information

## 2021-02-26 NOTE — Progress Notes (Signed)
eLink Physician-Brief Progress Note Patient Name: Taylor [name] DOB: 02-25-1965 MRN: [id number]   Date of Service  02/26/2021  HPI/Events of Note  Having runs of NSVT and frequent PVCs  eICU Interventions  Ordered BMP, iCa, Mg and phos     Intervention Category Intermediate Interventions: Arrhythmia - evaluation and management  Darl Pikes 02/26/2021, 9:45 PM

## 2021-02-26 NOTE — Progress Notes (Signed)
PHARMACY - PHYSICIAN COMMUNICATION CRITICAL VALUE ALERT - BLOOD CULTURE IDENTIFICATION (BCID)  Taylor [name] is an 56 y.o. female who presented to [facility] on 02/09/2021 with a chief complaint of stroke  Assessment:  1/2 BCx with coag negative staph epi. Currently being treated with cefepime for E.coli UTI and citrobacter pna.   Name of physician (or Provider) Contacted: Dr. Lucile Shutters  Current antibiotics: Cefepime  Changes to prescribed antibiotics recommended:  Patient is on recommended antibiotics - No changes needed  No results found for this or any previous visit.  Albertina Parr, PharmD., BCPS, BCCCP Clinical Pharmacist Please refer to [facility] for unit-specific pharmacist

## 2021-02-26 NOTE — Progress Notes (Signed)
NAME:  Taylor [name], MRN:  [id number], DOB:  1965/07/02, LOS: 74 ADMISSION DATE:  02/01/2021, CONSULTATION DATE:  12/27 REFERRING MD:  Dr Tennis Must Sindy Messing , CHIEF COMPLAINT:   CVA  History of Present Illness:  56 year old female with PMH as below, which is significant for HFrEF with LVEF 20-25 % (2017), DM, and OSA on CPAP. 12/27 she presented to [facility] ED with complaints of acute onset left sided weakness and slurred speech. EMS noted non-verbal and right gaze preference. Imaging consistent with occlusion of the distal basilar arterty, right P1, and proximal R P2. She was given systemic TNK and mechanical thrombectomy  with stent retriever on the right with complete recanalization after one pass (TICI3). PCCM consulted for vent management and ICU care.    Pertinent  Medical History   has a past medical history of CHF (congestive heart failure) ([place]), Diabetes mellitus without complication ([place]), and Obstructive sleep apnea.   Significant Hospital Events: Including procedures, antibiotic start and stop dates in addition to other pertinent events   12/27 admit for posterior circulation stroke. TNK given and IR throbmectomy 12/28 echo with LV thrombus, EF 20-25%.  Started on heparin 12/30 failing weaning trials.  Started on cefepime for positive UA 1/1 On PSV weans 1/4: patient extubated and developed stridor and increase wob; required reintubation; decadron and lasix given 1/6 restarted Decadron since there is no cuff leak.  Lasix dose increased 1/9 Still waiting on family to decide on tracheostomy   Interim History / Subjective:   No overnight events.  Follows commands very weakly but will participate in therapy with much coaxing.   Objective   Blood pressure 122/86, pulse (!) 102, temperature 100 F (37.8 C), temperature source Axillary, resp. rate (!) 21, height 5\' 1"  (1.549 m), weight 60.2 kg, SpO2 100 %.    Vent Mode: PRVC FiO2 (%):  [30 %] 30 % Set Rate:  [18 bmp] 18  bmp Vt Set:  [380 mL] 380 mL PEEP:  [5 cmH20] 5 cmH20 Pressure Support:  [14 cmH20] 14 cmH20 Plateau Pressure:  [16 cmH20-18 cmH20] 18 cmH20   Intake/Output Summary (Last 24 hours) at 02/26/2021 0957 Last data filed at 02/26/2021 0800 Gross per 24 hour  Intake 1663.34 ml  Output 2330 ml  Net -666.66 ml    Filed Weights   02/24/21 0413 02/25/21 0500 02/26/21 0500  Weight: 60.7 kg 60.2 kg 60.2 kg    Examination: Gen:      No acute distress, frail, appears malnourished.  HEENT:  EOMI, sclera anicteric Neck:     No masses; no thyromegaly ET tube Lungs:    Clear to auscultation bilaterally; normal respiratory effort CV:         Regular rate and rhythm; no murmurs Abd:      + bowel sounds; soft, non-tender; no palpable masses, no distension Ext:    No edema; adequate peripheral perfusion Skin:      Warm and dry; no rash Neuro:   Somnolent, not following commands for me  Ancillary tests personally reviewed:   WBC: 15 CO2 31  Assessment & Plan:   CVA: basilar, bilateral PI/PCA. TNK given in ED and she is s/p mechanical thrombectomy on the right and mechanical thrombectomy with stent placement on the left.  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 coli UTI, MSSA pneumonia Type 2 DM  Plan:  - Continue PSV - mental status and tracheal stenosis preclude extubation.  - Will reengage family regarding tracheostomy - Anticipate slow recovery.  - Heart failure appears clinically compensated.  Unable to tolerate guideline directed heart failure therapy due to hypotension requiring midodrine. -Complete 7 days of cefepime for E. coli UTI and MSSA pneumonia -Continue IV heparin for LV thrombus -Continue stroke rehabilitation as tolerated -Continue secondary stroke prevention with statin and  Brilinta.  Best Practice (right click and "Reselect all SmartList Selections" daily)   Diet/type: tubefeeds DVT prophylaxis: systemic heparin GI prophylaxis: PPI Lines: N/A Foley:  Yes, and it is still needed Code Status:  full code Last date of multidisciplinary goals of care discussion [1/6 updated brandon son over phone ]  Critical care time:   The patient is critically ill with multiple organ system failure and requires high complexity decision making for assessment and support, frequent evaluation and titration of therapies, advanced monitoring, review of radiographic studies and interpretation of complex data.   Critical Care Time devoted to patient care services, exclusive of separately billable procedures, described in this note is 35 minutes.   Kipp Brood, MD [facility] ICU Physician [place]  Pager: [phone] Or Epic Secure Chat After hours: [phone].  02/26/2021, 9:57 AM      If no response to pager , please call [phone] until 7pm After 7:00 pm call Elink  [phone] 02/26/2021, 9:57 AM   ,

## 2021-02-26 NOTE — Progress Notes (Signed)
Orthopedic Tech Progress Note Patient Details:  Taylor [name] 03-30-1965 [id number]  Ortho Devices Type of Ortho Device: Prafo boot/shoe Ortho Device/Splint Location: BIL Ortho Device/Splint Interventions: Ordered     Dropped off PRAFO boots with RN.  [place] A Rupinder Livingston 02/26/2021, 11:08 AM

## 2021-02-26 NOTE — Progress Notes (Signed)
ANTICOAGULATION CONSULT NOTE  Pharmacy Consult for Heparin Indication:  LV thrombus, s/p CVA   No Known Allergies  Patient Measurements: Height: 5\' 1"  (154.9 cm) Weight: 60.2 kg (132 lb 11.5 oz) IBW/kg (Calculated) : 47.8 Heparin Dosing Weight: 63.4 kg  Vital Signs: Temp: 99.5 F (37.5 C) (01/10 0400) Temp Source: Axillary (01/10 0400) BP: 108/75 (01/10 0800) Pulse Rate: 99 (01/10 0800)  Labs: Recent Labs    02/24/21 0653 02/24/21 1001 02/24/21 2010 02/25/21 0420 02/25/21 0637 02/26/21 0408  HGB 12.9  --   --   --  13.4 13.4  HCT 43.0  --   --   --  44.5 44.6  PLT 406*  --   --   --  358 342  HEPARINUNFRC 0.75*   < > 0.46 0.31  --  0.55  CREATININE 0.82  --   --   --  0.76 0.78   < > = values in this interval not displayed.    Estimated Creatinine Clearance: 65.5 mL/min (by C-G formula based on SCr of 0.78 mg/dL).   Assessment: 60 YOF continuing heparin for new LV thrombus in setting of acute CVA. S/p mechanical thrombectomy and TNK.   Heparin level acceptable (fluctuating between low normal and slightly high level).  CBC stable.  Bleeding from foley resolved per RN, likely from traumatic placement.  Goal of Therapy:  Heparin level 0.3-0.5 units/ml Monitor platelets by anticoagulation protocol: Yes   Plan:  Continue heparin gtt at 1050 units/hr Monitor heparin level and CBC daily Monitor for s/sx of bleeding  Beyonca Wisz D. Mina Marble, PharmD, BCPS, [place] 02/26/2021, 2:17 PM

## 2021-02-26 NOTE — Progress Notes (Addendum)
STROKE TEAM PROGRESS NOTE   INTERVAL HISTORY:  No family at the bedside. Patient is still intubated. Lethargic, still following commands. BP stable, HR 95-104, still lethargic but no neurological change.  Afebrile today but WBC 18.0, resp culture, blood cultures sent on 1/8. Vanco and  cefepime started. Steroid course completed 02/23/2021. Patient was unable to wean this morning. Continue conversation about tracheostomy with family.   Vitals:   02/26/21 1000 02/26/21 1100 02/26/21 1122 02/26/21 1124  BP: 125/83 115/74  115/74  Pulse: (!) 103 98  (!) 102  Resp: 19 (!) 23  (!) 26  Temp:      TempSrc:      SpO2: 100% 98% 99% 98%  Weight:      Height:       CBC:  Recent Labs  Lab 02/25/21 0637 02/26/21 0408  WBC 18.0* 15.2*  HGB 13.4 13.4  HCT 44.5 44.6  MCV 93.5 93.7  PLT 358 [id number]    Basic Metabolic Panel:  Recent Labs  Lab 02/25/21 0637 02/26/21 0408  NA 139 140  K 4.2 4.4  CL 94* 98  CO2 36* 31  GLUCOSE 184* 191*  BUN 33* 33*  CREATININE 0.76 0.78  CALCIUM 8.7* 8.4*    Lipid Panel:  No results for input(s): CHOL, TRIG, HDL, CHOLHDL, VLDL, LDLCALC in the last 168 hours.  HgbA1c:  No results for input(s): HGBA1C in the last 168 hours.  Urine Drug Screen: No results for input(s): LABOPIA, COCAINSCRNUR, LABBENZ, AMPHETMU, THCU, LABBARB in the last 168 hours.  Alcohol Level  No results for input(s): ETH in the last 168 hours.   IMAGING past 24 hours No results found.  PHYSICAL EXAM  Temp:  [98.4 F (36.9 C)-100 F (37.8 C)] 100 F (37.8 C) (01/10 0800) Pulse Rate:  [84-105] 102 (01/10 1124) Resp:  [18-26] 26 (01/10 1124) BP: (91-125)/(59-86) 115/74 (01/10 1124) SpO2:  [97 %-100 %] 98 % (01/10 1124) FiO2 (%):  [30 %] 30 % (01/10 1124) Weight:  [60.2 kg] 60.2 kg (01/10 0500)  General - [name], intubated off sedation.  Cardiovascular - Regular rhythm but tachycardia.  Neuro - intubated off sedation, awake alert but very  lethargic, follows most commands but less active.  Right gaze preference barely cross midline.  PERRL, horizontal nystagmus present on left gaze.  Not blink to threat on left side. RUE 2/5 proximal and 3/5 distal and 2/5 RLE today, and 0/5 in LUE and LLE today. Sensation, coordination and gait not tested.   ASSESSMENT/PLAN [name] is a 56 y.o. female with history of CHF, T2DM and OSA presenting with acute onset left-sided weakness, right gaze deviation and slurred speech. She was found to have occlusion of the distal basilar artery, right P1 and proximal right P2.  She was given TNK and received mechanical thrombectomy to the right and left PCA with stenting on the left. She was found to have two apical thrombi on her 2D echocardiogram and remains on IV heparin. Extubated and then emergently reintubated on 02/20/2021.  She weaned on pressure support 5/5 with an FiO2 30% on 1/8, however unable to wean in the morning on 1/9.  CCM and steroid and diuretic course completed 02/23/2021 for swollen airway. WBC 18.0, respiratory, blood, and urine cultures sent. Vanco and cefepime started 02/24/2021.  Stroke:  embolic shower with b/l PCA occlusion s/p IR with TICI3 and left PCA stenting, likely secondary due to embolism from low EF and apical thrombi Code Stroke CT  head No acute abnormality.  ASPECTS 10.    CTA head & neck nonopacification of distal basilar artery, right P1 and P2, poor opacification of right vertebral artery, focal stenosis in right A1 and A3 IR with b/l P1 occlusion s/p thrombectomy with TICI3 and left PCA stent MRI  Acute right PCA territory infarct, in right thalamus and occipital lobe, small acute infarcts in bilateral cerebellar hemispheres with scattered small acute infarcts in bilateral parietal lobes, left occipital and left frontal lobes, area of susceptibility infarct in right occipital lobe suggestive of petechial hemorrhage MRA thrombectomy and placement of left P1/P2 stent with  good flow related signal in distal left PCA, poor flow signal in distal basilar tip, poor opacification of right vertebral artery, right A1 and A3 ACA stenosis 2D Echo global hypokinesis with inferior akinesis, EF 0000000, grade 3 diastolic dysfunction, thrombi in inferoapical and anteroapical areas, no atrial level shunt LDL 151 HgbA1c 11.9 VTE prophylaxis -Heparin IV aspirin 81 mg daily prior to admission, Now on heparin IV and Brilinta.  Transition to PO AC once extubated with p.o. access. 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 UA WBC > 50 Urine culture Gram neg rods. 02/17/21 E Coli Sensitive to all antibiotics tested On cefepime -completed course Management per CCM Now on vanco and cefepime 02/24/2021  Hyperlipidemia Home meds:  atorvastatin 40 mg daily, resumed in hospital LDL 151, goal < 70 Increase to 80 mg atorvastatin daily  Continue statin at discharge  Diabetes type II Uncontrolled Home meds:  Jardiance 25 mg daily, levemir 17 units BID, metformin 1000 mg BID HgbA1c 11.9, goal < 7.0 CBGs Diabetes coordinator consult SSI- 0-20u q4  Other Stroke Risk Factors Former cigarette smoker Obstructive sleep apnea  Other Active Problems Hyperkalemia - 5.3 - > 5.0-> 4.2 (resolved) Dysphagia on tube feeding @  [facility] day # 14  Patient seen and examined by NP/APP with MD. MD to update note as needed.   Janine Ores, DNP, FNP-BC [facility] Pager: [phone]  ATTENDING NOTE: I reviewed above note and agree with the assessment and plan. Pt was seen and examined.   No family at the bedside. Pt still intubated, lethargic, less responsive as yesterday but still able to follow command on the right with repeated attempts. Still pending family decision on trach. Planned family meeting today but daughter did not show up. Will continue family discussion.   For detailed assessment and plan, please refer to above as I have made changes wherever appropriate.   Rosalin Hawking, MD PhD Stroke Neurology 02/26/2021 10:18 PM  This patient is critically ill due to LV thrombus, respiratory failure, emobolic strokes. Low EF, carotid stenosis s/p stenting, on heparin and brilinta and at significant risk of neurological worsening, death form bleeding from heparin IV, heart failure, cardiac arrest, respiratory failure, recurrent stroke. This patient's care requires constant monitoring of vital signs, hemodynamics, respiratory and cardiac monitoring,  review of multiple databases, neurological assessment, discussion with family, other specialists and medical decision making of high complexity. I spent 35 minutes of neurocritical care time in the care of this patient. I discussed with Dr. Franco Nones.      To contact Stroke Continuity provider, please refer to [email or web address] After hours, contact General Neurology

## 2021-02-27 DIAGNOSIS — N390 Urinary tract infection, site not specified: Secondary | ICD-10-CM

## 2021-02-27 DIAGNOSIS — E1159 Type 2 diabetes mellitus with other circulatory complications: Secondary | ICD-10-CM | POA: Diagnosis not present

## 2021-02-27 DIAGNOSIS — Z978 Presence of other specified devices: Secondary | ICD-10-CM | POA: Diagnosis not present

## 2021-02-27 DIAGNOSIS — I639 Cerebral infarction, unspecified: Secondary | ICD-10-CM | POA: Diagnosis not present

## 2021-02-27 DIAGNOSIS — J9601 Acute respiratory failure with hypoxia: Secondary | ICD-10-CM | POA: Diagnosis not present

## 2021-02-27 LAB — CBC
HCT: 45.1 % (ref 36.0–46.0)
Hemoglobin: 13.2 g/dL (ref 12.0–15.0)
MCH: 28 pg (ref 26.0–34.0)
MCHC: 29.3 g/dL — ABNORMAL LOW (ref 30.0–36.0)
MCV: 95.6 fL (ref 80.0–100.0)
Platelets: 352 10*3/uL (ref 150–400)
RBC: 4.72 MIL/uL (ref 3.87–5.11)
RDW: 16.9 % — ABNORMAL HIGH (ref 11.5–15.5)
WBC: 15.4 10*3/uL — ABNORMAL HIGH (ref 4.0–10.5)
nRBC: 0 % (ref 0.0–0.2)

## 2021-02-27 LAB — GLUCOSE, CAPILLARY
Glucose-Capillary: 162 mg/dL — ABNORMAL HIGH (ref 70–99)
Glucose-Capillary: 158 mg/dL — ABNORMAL HIGH (ref 70–99)
Glucose-Capillary: 185 mg/dL — ABNORMAL HIGH (ref 70–99)
Glucose-Capillary: 169 mg/dL — ABNORMAL HIGH (ref 70–99)
Glucose-Capillary: 161 mg/dL — ABNORMAL HIGH (ref 70–99)
Glucose-Capillary: 138 mg/dL — ABNORMAL HIGH (ref 70–99)

## 2021-02-27 LAB — BASIC METABOLIC PANEL
Anion gap: 10 (ref 5–15)
BUN: 30 mg/dL — ABNORMAL HIGH (ref 6–20)
CO2: 31 mmol/L (ref 22–32)
Calcium: 8.5 mg/dL — ABNORMAL LOW (ref 8.9–10.3)
Chloride: 99 mmol/L (ref 98–111)
Creatinine, Ser: 0.78 mg/dL (ref 0.44–1.00)
GFR, Estimated: >60 mL/min (ref >60)
Glucose, Bld: 181 mg/dL — ABNORMAL HIGH (ref 70–99)
Potassium: 4.1 mmol/L (ref 3.5–5.1)
Sodium: 140 mmol/L (ref 135–145)

## 2021-02-27 LAB — HEPARIN LEVEL (UNFRACTIONATED): Heparin Unfractionated: 0.36 IU/mL (ref 0.30–0.70)

## 2021-02-27 LAB — PHOSPHORUS: Phosphorus: 2.8 mg/dL (ref 2.5–4.6)

## 2021-02-27 LAB — MAGNESIUM: Magnesium: 2.6 mg/dL — ABNORMAL HIGH (ref 1.7–2.4)

## 2021-02-27 MED ORDER — INSULIN ASPART 100 UNIT/ML IJ SOLN
2.0000 [IU] | INTRAMUSCULAR | Status: DC
  Administered 2021-02-27 – 2021-03-08 (×42): 2 [IU] via SUBCUTANEOUS

## 2021-02-27 MED ORDER — BETHANECHOL CHLORIDE 25 MG PO TABS
25.0000 mg | ORAL_TABLET | Freq: Three times a day (TID) | ORAL | Status: DC
  Administered 2021-02-27 – 2021-03-09 (×32): 25 mg
  Filled 2021-02-27 (×32): qty 1

## 2021-02-27 NOTE — Progress Notes (Signed)
Occupational Therapy Treatment Patient Details Name: Taylor [name] MRN: [id number] DOB: 1965/02/19 Today's Date: 02/27/2021   History of present illness 56 y/o female presented to ED on 12/27 for L sided paralysis, R gaze, slurred speech, and L facial droop. TNK given. S/p mechanical thrombectomy of bilateral occluded P1/PCA. MRI showed acute R PCA infarcts including infarcts in the R thalamus and R occipital lobe with mild mass effect with partial effacement of 3rd ventricle, numerous small acute infarcts in bilateral cerebellar hemispheres and few scattered small acute infarcts in bilateral parietal lobes, L occipital and L frontal lobes. Intubated 12/27. PMH: CHF, sleep apnea, type 2 DM   OT comments  Patient seen in conjunction with PT this date.  Difficult session, patient not giving much effort at all with any attempted intervention.  POC being worked out with family as to possibility for trache collar.  OT advanced goal dates, but no progression of goals.  OT can continue efforts, but patient would need to give more effort.  OT changed recommendation to OT at [facility].     Recommendations for follow up therapy are one component of a multi-disciplinary discharge planning process, led by the attending physician.  Recommendations may be updated based on patient status, additional functional criteria and insurance authorization.    Follow Up Recommendations  OT at Long-term acute care hospital    Assistance Recommended at Discharge Frequent or constant Supervision/Assistance  Patient can return home with the following      Equipment Recommendations  Tub/shower seat;Wheelchair cushion (measurements OT);Wheelchair (measurements OT)    Recommendations for Other Services      Precautions / Restrictions Precautions Precautions: Fall Precaution Comments: vent foley and cortrak Restrictions Weight Bearing Restrictions: No              ADL either performed or assessed with clinical  judgement   ADL Overall ADL's : Needs assistance/impaired Eating/Feeding: NPO                                     General ADL Comments: total (A) for all care. pt is not putting forth much effort.    Extremity/Trunk Assessment Upper Extremity Assessment RUE Deficits / Details: minimal participation with UB HEP RUE Coordination: decreased fine motor;decreased gross motor LUE Deficits / Details: no trace movement at this time LUE Sensation: decreased light touch LUE Coordination: decreased fine motor;decreased gross motor       Cervical / Trunk Assessment Cervical / Trunk Assessment: Kyphotic                      Cognition Arousal/Alertness: Awake/alert Behavior During Therapy: Flat affect Overall Cognitive Status: Difficult to assess                                 General Comments: following simple commands majority of time, more alert/awake once EOB, pt able to give thumbs up, show 2 fingers, and initiate high five to command in addition to working with R UE and LE in exercises          Exercises General Exercises - Upper Extremity Shoulder Flexion: PROM;AAROM;Right;Left;Supine;10 reps Shoulder Extension: PROM;AAROM Elbow Extension: PROM;AAROM;Right;Left;Supine;10 reps Wrist Flexion: PROM;AAROM;Right;Left;Supine;10 reps Wrist Extension: PROM;AROM;Right;Left;10 reps;Supine Digit Composite Flexion: PROM;10 reps;Right;Left;AROM;Supine Composite Extension: PROM;AROM;Right;Left;Supine   Shoulder Instructions       General Comments  Pertinent Vitals/ Pain       Pain Assessment: Faces Faces Pain Scale: No hurt Facial Expression: Relaxed, neutral Body Movements: Absence of movements Muscle Tension: Relaxed Compliance with ventilator (intubated pts.): Tolerating ventilator or movement Vocalization (extubated pts.): N/A CPOT Total: 0 Pain Intervention(s): Monitored during session                                                           Frequency  Min 2X/week        Progress Toward Goals  OT Goals(current goals can now be found in the care plan section)  Progress towards OT goals: Not progressing toward goals - comment (minimal effort given this session)  Acute Rehab OT Goals OT Goal Formulation: Patient unable to participate in goal setting Time For Goal Achievement: 03/14/21 Potential to Achieve Goals: Fair  Plan Discharge plan needs to be updated    Co-evaluation    PT/OT/SLP Co-Evaluation/Treatment: Yes Reason for Co-Treatment: Complexity of the patient's impairments (multi-system involvement);Necessary to address cognition/behavior during functional activity;For patient/therapist safety   OT goals addressed during session: Strengthening/ROM      AM-PAC OT "6 Clicks" Daily Activity     Outcome Measure   Help from another person eating meals?: Total Help from another person taking care of personal grooming?: Total Help from another person toileting, which includes using toliet, bedpan, or urinal?: Total Help from another person bathing (including washing, rinsing, drying)?: Total Help from another person to put on and taking off regular upper body clothing?: Total Help from another person to put on and taking off regular lower body clothing?: Total 6 Click Score: 6    End of Session    OT Visit Diagnosis: Muscle weakness (generalized) (M62.81);Other symptoms and signs involving cognitive function;Hemiplegia and hemiparesis Hemiplegia - Right/Left: Left Hemiplegia - dominant/non-dominant: Non-Dominant Hemiplegia - caused by: Cerebral infarction   Activity Tolerance Other (comment) (Minimal effort limiting session)   Patient Left in bed;with call bell/phone within reach;with bed alarm set   Nurse Communication Mobility status;Need for lift equipment;Precautions        Time: 1610-9604 OT Time Calculation (min): 23 min  Charges: OT General Charges $OT  Visit: 1 Visit OT Treatments $Therapeutic Exercise: 8-22 mins  02/27/2021  RP, OTR/L  Acute Rehabilitation Services  Office:  [phone]   Suzanna Obey 02/27/2021, 3:41 PM

## 2021-02-27 NOTE — Progress Notes (Signed)
Notified MD of blood in urinary catheter. No frank blood. Urine has slight blood tinge with sediment and clots. Still some slight bleeding around the urethra as well. Orders to pause heparin for 3 hours per Dr. Lynetta Mare.

## 2021-02-27 NOTE — Progress Notes (Signed)
NAME:  Taylor [name], MRN:  [id number], DOB:  08/09/1965, LOS: 54 ADMISSION DATE:  02/03/2021, CONSULTATION DATE:  12/27 REFERRING MD:  Dr Tennis Must Sindy Messing , CHIEF COMPLAINT:   CVA  History of Present Illness:  56 year old female with PMH as below, which is significant for HFrEF with LVEF 20-25 % (2017), DM, and OSA on CPAP. 12/27 she presented to [facility] ED with complaints of acute onset left sided weakness and slurred speech. EMS noted non-verbal and right gaze preference. Imaging consistent with occlusion of the distal basilar arterty, right P1, and proximal R P2. She was given systemic TNK and mechanical thrombectomy  with stent retriever on the right with complete recanalization after one pass (TICI3). PCCM consulted for vent management and ICU care.    Pertinent  Medical History   has a past medical history of CHF (congestive heart failure) ([place]), Diabetes mellitus without complication ([place]), and Obstructive sleep apnea.   Significant Hospital Events: Including procedures, antibiotic start and stop dates in addition to other pertinent events   12/27 admit for posterior circulation stroke. TNK given and IR throbmectomy 12/28 echo with LV thrombus, EF 20-25%.  Started on heparin 12/30 failing weaning trials.  Started on cefepime for positive UA 1/1 On PSV weans 1/4: patient extubated and developed stridor and increase wob; required reintubation; decadron and lasix given 1/6 restarted Decadron since there is no cuff leak.  Lasix dose increased 1/9 Still waiting on family to decide on tracheostomy   Interim History / Subjective:   No overnight events.  Follows commands very weakly but will participate in therapy with much coaxing.   Objective   Blood pressure 108/73, pulse (!) 101, temperature 99.5 F (37.5 C), temperature source Oral, resp. rate (!) 21, height 5\' 1"  (1.549 m), weight 60.2 kg, SpO2 100 %.    Vent Mode: PRVC FiO2 (%):  [30 %] 30 % Set Rate:  [18 bmp] 18  bmp Vt Set:  [380 mL] 380 mL PEEP:  [5 cmH20] 5 cmH20 Plateau Pressure:  [18 cmH20-20 cmH20] 18 cmH20   Intake/Output Summary (Last 24 hours) at 02/27/2021 1317 Last data filed at 02/27/2021 1200 Gross per 24 hour  Intake 1736.22 ml  Output 1500 ml  Net 236.22 ml    Filed Weights   02/24/21 0413 02/25/21 0500 02/26/21 0500  Weight: 60.7 kg 60.2 kg 60.2 kg    Examination: Gen:      No acute distress, frail, appears malnourished.  HEENT:  EOMI, sclera anicteric Neck:     No masses; no thyromegaly ET tube Lungs:    Clear to auscultation bilaterally; normal respiratory effort CV:         Regular rate and rhythm; no murmurs Abd:      + bowel sounds; soft, non-tender; no palpable masses, no distension Ext:    No edema; adequate peripheral perfusion Skin:      Warm and dry; no rash Neuro:   Somnolent, not following commands for me  Ancillary tests personally reviewed:   WBC: 15 CO2 31  Assessment & Plan:   CVA: basilar, bilateral PI/PCA. TNK given in ED and she is s/p mechanical thrombectomy on the right and mechanical thrombectomy with stent placement on the left.  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 coli UTI, MSSA pneumonia Type  2 DM  Plan:  - Continue PSV - mental status and tracheal stenosis preclude extubation.  - Will reengage family regarding tracheostomy - Anticipate slow recovery.  - Heart failure appears clinically compensated.  Unable to tolerate guideline directed heart failure therapy due to hypotension requiring midodrine. -Complete 7 days of cefepime for E. coli UTI and MSSA pneumonia -Continue IV heparin for LV thrombus -Continue stroke rehabilitation as tolerated -Continue secondary stroke prevention with statin and Brilinta.  Best Practice (right click and  "Reselect all SmartList Selections" daily)   Diet/type: tubefeeds DVT prophylaxis: systemic heparin GI prophylaxis: PPI Lines: N/A Foley:  Yes, and it is still needed Code Status:  full code Last date of multidisciplinary goals of care discussion [1/6 updated brandon son over phone ]  Critical care time:   The patient is critically ill with multiple organ system failure and requires high complexity decision making for assessment and support, frequent evaluation and titration of therapies, advanced monitoring, review of radiographic studies and interpretation of complex data.   Critical Care Time devoted to patient care services, exclusive of separately billable procedures, described in this note is 35 minutes.   Kipp Brood, MD [facility] ICU Physician [place]  Pager: [phone] Or Epic Secure Chat After hours: [phone].  02/27/2021, 1:17 PM

## 2021-02-27 NOTE — Progress Notes (Signed)
Physical Therapy Treatment Patient Details Name: Taylor [name] MRN: [id number] DOB: 05/07/1965 Today's Date: 02/27/2021   History of Present Illness 56 y/o female presented to ED on 12/27 for L sided paralysis, R gaze, slurred speech, and L facial droop. TNK given. S/p mechanical thrombectomy of bilateral occluded P1/PCA. MRI showed acute R PCA infarcts including infarcts in the R thalamus and R occipital lobe with mild mass effect with partial effacement of 3rd ventricle, numerous small acute infarcts in bilateral cerebellar hemispheres and few scattered small acute infarcts in bilateral parietal lobes, L occipital and L frontal lobes. Intubated 12/27. PMH: CHF, sleep apnea, type 2 DM    PT Comments    Patient with limited participation requiring max encouragement but little success in changing patient's mood. Patient with no progression towards PT goals at this time. Awaiting family decision on moving forward with trach. Updated recommendation to [facility] at this time.     Recommendations for follow up therapy are one component of a multi-disciplinary discharge planning process, led by the attending physician.  Recommendations may be updated based on patient status, additional functional criteria and insurance authorization.  Follow Up Recommendations  PT at [facility]-term acute care hospital     Assistance Recommended at Discharge Frequent or constant Supervision/Assistance  Patient can return home with the following Two people to help with bathing/dressing/bathroom;Two people to help with walking and/or transfers;Assistance with cooking/housework;Assistance with feeding;Direct supervision/assist for medications management;Direct supervision/assist for financial management   Equipment Recommendations  Other (comment) (TBD)    Recommendations for Other Services       Precautions / Restrictions Precautions Precautions: Fall Precaution Comments: vent foley and cortrak Restrictions Weight  Bearing Restrictions: No     Mobility  Bed Mobility               General bed mobility comments: lethargic, lack of motivation so deferred this date.    Transfers                        Ambulation/Gait                   Stairs             Wheelchair Mobility    Modified Rankin (Stroke Patients Only) Modified Rankin (Stroke Patients Only) Pre-Morbid Rankin Score: No significant disability Modified Rankin: Severe disability     Balance                                            Cognition Arousal/Alertness: Lethargic Behavior During Therapy: Flat affect Overall Cognitive Status: Difficult to assess                                 General Comments: following commands intermittently with delay and max encouragement. lethargic and poor motivation. requires frequent encouragement and coaching to perform exercises        Exercises General Exercises - Upper Extremity Shoulder Flexion: PROM;AAROM;Right;Left;Supine;10 reps Shoulder Extension: PROM;AAROM Elbow Extension: PROM;AAROM;Right;Left;Supine;10 reps Wrist Flexion: PROM;AAROM;Right;Left;Supine;10 reps Wrist Extension: PROM;AROM;Right;Left;10 reps;Supine Digit Composite Flexion: PROM;10 reps;Right;Left;AROM;Supine Composite Extension: PROM;AROM;Right;Left;Supine General Exercises - Lower Extremity Ankle Circles/Pumps: PROM;Left;AAROM;Right;10 reps Short Arc QuadBarbaraann Boys;Right;PROM;Left;5 reps Straight Leg Raises: PROM;Both;5 reps    General Comments        Pertinent Vitals/Pain Pain Assessment: Faces  Faces Pain Scale: No hurt Facial Expression: Relaxed, neutral Body Movements: Absence of movements Muscle Tension: Relaxed Compliance with ventilator (intubated pts.): Tolerating ventilator or movement Vocalization (extubated pts.): N/A CPOT Total: 0 Pain Intervention(s): Monitored during session    Home Living                           Prior Function            PT Goals (current goals can now be found in the care plan section) Acute Rehab PT Goals Patient Stated Goal: unable to state PT Goal Formulation: Patient unable to participate in goal setting Time For Goal Achievement: 02/28/21 Potential to Achieve Goals: Fair Progress towards PT goals: Not progressing toward goals - comment    Frequency    Min 3X/week      PT Plan Discharge plan needs to be updated    Co-evaluation PT/OT/SLP Co-Evaluation/Treatment: Yes Reason for Co-Treatment: For patient/therapist safety;Complexity of the patient's impairments (multi-system involvement);To address functional/ADL transfers PT goals addressed during session: Strengthening/ROM OT goals addressed during session: Strengthening/ROM      AM-PAC PT "6 Clicks" Mobility   Outcome Measure  Help needed turning from your back to your side while in a flat bed without using bedrails?: Total Help needed moving from lying on your back to sitting on the side of a flat bed without using bedrails?: Total Help needed moving to and from a bed to a chair (including a wheelchair)?: Total Help needed standing up from a chair using your arms (e.g., wheelchair or bedside chair)?: Total Help needed to walk in hospital room?: Total Help needed climbing 3-5 steps with a railing? : Total 6 Click Score: 6    End of Session Equipment Utilized During Treatment: Oxygen Activity Tolerance: Patient limited by lethargy Patient left: in bed;with call bell/phone within reach Nurse Communication: Mobility status PT Visit Diagnosis: Unsteadiness on feet (R26.81);Muscle weakness (generalized) (M62.81);Difficulty in walking, not elsewhere classified (R26.2);Other symptoms and signs involving the nervous system (P54.656)     Time: 8127-5170 PT Time Calculation (min) (ACUTE ONLY): 23 min  Charges:  $Therapeutic Exercise: 8-22 mins                     Jazia Faraci A. Dan Humphreys PT, DPT Acute  Rehabilitation Services Pager [phone] Office [phone]    Viviann Spare 02/27/2021, 5:11 PM

## 2021-02-27 NOTE — Progress Notes (Addendum)
STROKE TEAM PROGRESS NOTE   INTERVAL HISTORY:  Patient is seen in her room with no family at the bedside.  She has been hemodynamically stable and her neurological exam remains stable.  Awaiting discussion with family on tracheostomy placement as patient has not been able to be extubated.  Vitals:   02/27/21 1000 02/27/21 1100 02/27/21 1159 02/27/21 1200  BP: 116/84 114/79 114/79 108/73  Pulse: (!) 111 (!) 107 (!) 105 (!) 101  Resp: 18 18 (!) 22 (!) 21  Temp:    99.5 F (37.5 C)  TempSrc:    Oral  SpO2: 99% 99% 100% 100%  Weight:      Height:    5\' 1"  (1.549 m)   CBC:  Recent Labs  Lab 02/26/21 0408 02/27/21 0333  WBC 15.2* 15.4*  HGB 13.4 13.2  HCT 44.6 45.1  MCV 93.7 95.6  PLT 342 [id number]    Basic Metabolic Panel:  Recent Labs  Lab 02/26/21 0408 02/27/21 0333  NA 140 140  K 4.4 4.1  CL 98 99  CO2 31 31  GLUCOSE 191* 181*  BUN 33* 30*  CREATININE 0.78 0.78  CALCIUM 8.4* 8.5*  MG  --  2.6*  PHOS  --  2.8    Lipid Panel:  No results for input(s): CHOL, TRIG, HDL, CHOLHDL, VLDL, LDLCALC in the last 168 hours.  HgbA1c:  No results for input(s): HGBA1C in the last 168 hours.  Urine Drug Screen: No results for input(s): LABOPIA, COCAINSCRNUR, LABBENZ, AMPHETMU, THCU, LABBARB in the last 168 hours.  Alcohol Level  No results for input(s): ETH in the last 168 hours.   IMAGING past 24 hours No results found.  PHYSICAL EXAM  Temp:  [98.1 F (36.7 C)-100.2 F (37.9 C)] 99.5 F (37.5 C) (01/11 1200) Pulse Rate:  [90-111] 101 (01/11 1200) Resp:  [18-33] 21 (01/11 1200) BP: (94-117)/(68-84) 108/73 (01/11 1200) SpO2:  [91 %-100 %] 100 % (01/11 1200) FiO2 (%):  [30 %] 30 % (01/11 1200)  General - [name], intubated off sedation.  Cardiovascular - Regular rhythm but tachycardia.  Neuro - intubated off sedation, awake alert but very lethargic, follows most commands but less active.  Will no in respose to wimple questions  Right gaze  preference barely cross midline.  PERRL, horizontal nystagmus present bilaterally.  Not blink to threat on left side. RUE  3/5 and 2/5 RLE today, and 0/5 in LUE and LLE today. Sensation, coordination and gait not tested.   ASSESSMENT/PLAN Taylor [name] is a 56 y.o. female with history of CHF, T2DM and OSA presenting with acute onset left-sided weakness, right gaze deviation and slurred speech. She was found to have occlusion of the distal basilar artery, right P1 and proximal right P2.  She was given TNK and received mechanical thrombectomy to the right and left PCA with stenting on the left. She was found to have two apical thrombi on her 2D echocardiogram and remains on IV heparin. Extubated and then emergently reintubated on 02/20/2021.  She weaned on pressure support 5/5 with an FiO2 30% on 1/8, however unable to wean in the morning on 1/9.  CCM and steroid and diuretic course completed 02/23/2021 for swollen airway. WBC 18.0, respiratory, blood, and urine cultures sent. Vanco and cefepime started 02/24/2021.  Stroke:  embolic shower with b/l PCA occlusion s/p IR with TICI3 and left PCA stenting, likely secondary due to embolism from low EF and apical thrombi Code Stroke CT head No acute  abnormality.  ASPECTS 10.    CTA head & neck nonopacification of distal basilar artery, right P1 and P2, poor opacification of right vertebral artery, focal stenosis in right A1 and A3 IR with b/l P1 occlusion s/p thrombectomy with TICI3 and left PCA stent MRI  Acute right PCA territory infarct, in right thalamus and occipital lobe, small acute infarcts in bilateral cerebellar hemispheres with scattered small acute infarcts in bilateral parietal lobes, left occipital and left frontal lobes, area of susceptibility infarct in right occipital lobe suggestive of petechial hemorrhage MRA thrombectomy and placement of left P1/P2 stent with good flow related signal in distal left PCA, poor flow signal in distal basilar tip,  poor opacification of right vertebral artery, right A1 and A3 ACA stenosis 2D Echo global hypokinesis with inferior akinesis, EF 0000000, grade 3 diastolic dysfunction, thrombi in inferoapical and anteroapical areas, no atrial level shunt LDL 151 HgbA1c 11.9 VTE prophylaxis -Heparin IV aspirin 81 mg daily prior to admission, Now on heparin IV and Brilinta.  Transition to PO AC once extubated with p.o. access. 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 UA WBC > 50 Urine culture Gram neg rods. 02/17/21 E Coli Sensitive to all antibiotics tested On cefepime -completed course Management per CCM on vanco and cefepime 02/24/2021 -> ancef  Hyperlipidemia Home meds:  atorvastatin 40 mg daily, resumed in hospital LDL 151, goal < 70 Increase to 80 mg atorvastatin daily  Continue statin at discharge  Diabetes type II Uncontrolled Home meds:  Jardiance 25 mg daily, levemir 17 units BID, metformin 1000 mg BID HgbA1c 11.9, goal < 7.0 CBGs Diabetes coordinator consult SSI- 0-20u q4  Other Stroke Risk Factors Former cigarette smoker Obstructive sleep apnea  Other Active Problems Hyperkalemia - 5.3 - > 5.0-> 4.2 (resolved) Dysphagia on tube feeding @ [facility] day # 15  Patient seen and examined by NP/APP with MD. MD to  update note as needed.   Taylor [name] , MSN, AGACNP-BC [facility] See Amion for schedule and pager information 02/27/2021 1:30 PM   ATTENDING NOTE: I reviewed above note and agree with the assessment and plan. Pt was seen and examined.   No family at bedside.  Patient RN at bedside.  Patient seems slightly more awake alert than yesterday, however still very lethargic, follows limited simple commands on the right hand as well as central commands.  Stable so far.  Still not able to wean off ventilation.  Discussed with CCM, will reengage family regarding tracheostomy.  Continue current management.  Appreciate CCM assistance.  For detailed assessment and plan, please refer to above as I have made changes wherever appropriate.   Taylor Hawking, MD PhD Stroke Neurology 02/27/2021 7:39 PM  This patient is critically ill due to LV thrombus, embolic stroke, CHF with low EF, UTI and pneumonia and at significant risk of neurological worsening, death form cardio arrest, recurrent  stroke, bleeding from heparin IV, seizure respiratory failure. This patient's care requires constant monitoring of vital signs, hemodynamics, respiratory and cardiac monitoring, review of multiple databases, neurological assessment, discussion with family, other specialists and medical decision making of high complexity. I spent 35 minutes of neurocritical care time in the care of this patient.  I discussed with Dr. Lynetta Mare CCM.    To contact Stroke Continuity provider, please refer to [email or web address] After hours, contact General Neurology

## 2021-02-27 NOTE — Progress Notes (Addendum)
ANTICOAGULATION CONSULT NOTE  Pharmacy Consult for Heparin Indication:  LV thrombus, s/p CVA   No Known Allergies  Patient Measurements: Height: 5\' 1"  (154.9 cm) Weight: 60.2 kg (132 lb 11.5 oz) IBW/kg (Calculated) : 47.8 Heparin Dosing Weight: 63.4 kg  Vital Signs: Temp: 99.2 F (37.3 C) (01/11 0800) Temp Source: Axillary (01/11 0800) BP: 117/83 (01/11 0900) Pulse Rate: 99 (01/11 0900)  Labs: Recent Labs    02/25/21 0420 02/25/21 04/25/21 02/25/21 0637 02/26/21 0408 02/27/21 0333  HGB  --  13.4   < > 13.4 13.2  HCT  --  44.5  --  44.6 45.1  PLT  --  358  --  342 352  HEPARINUNFRC 0.31  --   --  0.55 0.36  CREATININE  --  0.76  --  0.78 0.78   < > = values in this interval not displayed.    Estimated Creatinine Clearance: 65.5 mL/min (by C-G formula based on SCr of 0.78 mg/dL).   Assessment: 55 YOF continuing heparin for new LV thrombus in setting of acute CVA. S/p mechanical thrombectomy and TNK.   Heparin levels fluctuating between low normal and slightly high level.  CBC stable.  RN reports that heparin was paused for 3 hours on 1/10 PM because of blood and clots in the foley and there was some bleeding around the urethra. No further bleeding today 1/11 per discussion with RN.  Goal of Therapy:  Heparin level 0.3-0.5 units/ml Monitor platelets by anticoagulation protocol: Yes   Plan:  Continue heparin gtt at 1050 units/hr Monitor heparin level and CBC daily Monitor for s/sx of bleeding  Ieesha Abbasi D. [birth year]-26-1983, PharmD, BCPS, BCCCP 02/27/2021, 9:55 AM

## 2021-02-28 DIAGNOSIS — J9601 Acute respiratory failure with hypoxia: Secondary | ICD-10-CM | POA: Diagnosis not present

## 2021-02-28 DIAGNOSIS — I639 Cerebral infarction, unspecified: Secondary | ICD-10-CM | POA: Diagnosis not present

## 2021-02-28 DIAGNOSIS — I5020 Unspecified systolic (congestive) heart failure: Secondary | ICD-10-CM | POA: Diagnosis not present

## 2021-02-28 DIAGNOSIS — Z978 Presence of other specified devices: Secondary | ICD-10-CM | POA: Diagnosis not present

## 2021-02-28 DIAGNOSIS — J9602 Acute respiratory failure with hypercapnia: Secondary | ICD-10-CM

## 2021-02-28 LAB — CULTURE, BLOOD (ROUTINE X 2)

## 2021-02-28 LAB — HEPARIN LEVEL (UNFRACTIONATED)
Heparin Unfractionated: 0.63 IU/mL (ref 0.30–0.70)
Heparin Unfractionated: 0.52 IU/mL (ref 0.30–0.70)

## 2021-02-28 LAB — GLUCOSE, CAPILLARY
Glucose-Capillary: 94 mg/dL (ref 70–99)
Glucose-Capillary: 157 mg/dL — ABNORMAL HIGH (ref 70–99)
Glucose-Capillary: 141 mg/dL — ABNORMAL HIGH (ref 70–99)
Glucose-Capillary: 155 mg/dL — ABNORMAL HIGH (ref 70–99)
Glucose-Capillary: 177 mg/dL — ABNORMAL HIGH (ref 70–99)
Glucose-Capillary: 176 mg/dL — ABNORMAL HIGH (ref 70–99)

## 2021-02-28 LAB — CALCIUM, IONIZED: Calcium, Ionized, Serum: 4.7 mg/dL (ref 4.5–5.6)

## 2021-02-28 MED ORDER — NUTRISOURCE FIBER PO PACK
1.0000 | PACK | Freq: Two times a day (BID) | ORAL | Status: DC
  Administered 2021-02-28 – 2021-03-03 (×7): 1
  Filled 2021-02-28 (×7): qty 1

## 2021-02-28 MED ORDER — FENTANYL CITRATE PF 50 MCG/ML IJ SOSY
200.0000 ug | PREFILLED_SYRINGE | Freq: Once | INTRAMUSCULAR | Status: AC
  Administered 2021-03-04: 200 ug via INTRAVENOUS
  Filled 2021-02-28: qty 4

## 2021-02-28 MED ORDER — FUROSEMIDE 10 MG/ML IJ SOLN
40.0000 mg | Freq: Once | INTRAMUSCULAR | Status: AC
  Administered 2021-02-28: 40 mg via INTRAVENOUS
  Filled 2021-02-28: qty 4

## 2021-02-28 MED ORDER — VECURONIUM BROMIDE 10 MG IV SOLR
10.0000 mg | Freq: Once | INTRAVENOUS | Status: AC
  Administered 2021-03-04: 10 mg via INTRAVENOUS
  Filled 2021-02-28: qty 10

## 2021-02-28 MED ORDER — MIDAZOLAM HCL 2 MG/2ML IJ SOLN
5.0000 mg | Freq: Once | INTRAMUSCULAR | Status: AC
  Administered 2021-03-04: 4 mg via INTRAVENOUS
  Filled 2021-02-28: qty 6

## 2021-02-28 MED ORDER — ETOMIDATE 2 MG/ML IV SOLN
10.0000 mg | Freq: Once | INTRAVENOUS | Status: AC
  Administered 2021-03-04: 10 mg via INTRAVENOUS
  Filled 2021-02-28: qty 10

## 2021-02-28 NOTE — Progress Notes (Addendum)
STROKE TEAM PROGRESS NOTE   INTERVAL HISTORY:  Patient is seen in her room with no family at the bedside.  She has been hemodynamically stable and her neurological exam remains stable.   Still awaiting family discussion on tracheostomy placement.  Vitals:   02/28/21 0600 02/28/21 0700 02/28/21 0729 02/28/21 0800  BP: 108/79 99/72  102/77  Pulse: 96 93 (!) 107 100  Resp: (!) 24 17 (!) 31 (!) 21  Temp:    100 F (37.8 C)  TempSrc:    Axillary  SpO2: 98% 98%  99%  Weight:      Height:       CBC:  Recent Labs  Lab 02/26/21 0408 02/27/21 0333  WBC 15.2* 15.4*  HGB 13.4 13.2  HCT 44.6 45.1  MCV 93.7 95.6  PLT 342 [id number]    Basic Metabolic Panel:  Recent Labs  Lab 02/26/21 0408 02/27/21 0333  NA 140 140  K 4.4 4.1  CL 98 99  CO2 31 31  GLUCOSE 191* 181*  BUN 33* 30*  CREATININE 0.78 0.78  CALCIUM 8.4* 8.5*  MG  --  2.6*  PHOS  --  2.8    Lipid Panel:  No results for input(s): CHOL, TRIG, HDL, CHOLHDL, VLDL, LDLCALC in the last 168 hours.  HgbA1c:  No results for input(s): HGBA1C in the last 168 hours.  Urine Drug Screen: No results for input(s): LABOPIA, COCAINSCRNUR, LABBENZ, AMPHETMU, THCU, LABBARB in the last 168 hours.  Alcohol Level  No results for input(s): ETH in the last 168 hours.   IMAGING past 24 hours No results found.  PHYSICAL EXAM  Temp:  [98.9 F (37.2 C)-100.2 F (37.9 C)] 100 F (37.8 C) (01/12 0800) Pulse Rate:  [76-107] 100 (01/12 0800) Resp:  [16-31] 21 (01/12 0800) BP: (93-114)/(58-80) 102/77 (01/12 0800) SpO2:  [97 %-100 %] 99 % (01/12 0800) FiO2 (%):  [30 %] 30 % (01/12 0729) Weight:  [60 kg] 60 kg (01/12 0500)  General - [name], intubated off sedation.  Cardiovascular - Regular rhythm but tachycardia.  Neuro - intubated off sedation, awake alert but very lethargic, follows most commands but less active.  Will nod in respose to simple questions  Right gaze preference barely cross midline.  PERRL,  does not blink to threat on left side. Sensation, coordination and gait not tested.   ASSESSMENT/PLAN Taylor [name] is a 56 y.o. female with history of CHF, T2DM and OSA presenting with acute onset left-sided weakness, right gaze deviation and slurred speech. She was found to have occlusion of the distal basilar artery, right P1 and proximal right P2.  She was given TNK and received mechanical thrombectomy to the right and left PCA with stenting on the left. She was found to have two apical thrombi on her 2D echocardiogram and remains on IV heparin. Extubated and then emergently reintubated on 02/20/2021.  She weaned on pressure support 5/5 with an FiO2 30% on 1/8, however unable to wean in the morning on 1/9.  CCM and steroid and diuretic course completed 02/23/2021 for swollen airway. WBC 18.0, respiratory, blood, and urine cultures sent. Vanco and cefepime started 02/24/2021.  Stroke:  embolic shower with b/l PCA occlusion s/p IR with TICI3 and left PCA stenting, likely secondary due to embolism from low EF and apical thrombi Code Stroke CT head No acute abnormality.  ASPECTS 10.    CTA head & neck nonopacification of distal basilar artery, right P1 and P2, poor opacification of  right vertebral artery, focal stenosis in right A1 and A3 IR with b/l P1 occlusion s/p thrombectomy with TICI3 and left PCA stent MRI  Acute right PCA territory infarct, in right thalamus and occipital lobe, small acute infarcts in bilateral cerebellar hemispheres with scattered small acute infarcts in bilateral parietal lobes, left occipital and left frontal lobes, area of susceptibility infarct in right occipital lobe suggestive of petechial hemorrhage MRA thrombectomy and placement of left P1/P2 stent with good flow related signal in distal left PCA, poor flow signal in distal basilar tip, poor opacification of right vertebral artery, right A1 and A3 ACA stenosis 2D Echo global hypokinesis with inferior akinesis, EF 20-25%,  grade 3 diastolic dysfunction, thrombi in inferoapical and anteroapical areas, no atrial level shunt LDL 151 HgbA1c 11.9 VTE prophylaxis -Heparin IV aspirin 81 mg daily prior to admission, Now on heparin IV and Brilinta.  Transition to PO AC once extubated with p.o. access. 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 UA WBC > 50 Urine culture Gram neg rods. 02/17/21 E Coli Sensitive to all antibiotics tested On cefepime -completed course Management per CCM on vanco and cefepime 02/24/2021 -> ancef  Hyperlipidemia Home meds:  atorvastatin 40 mg daily, resumed in hospital LDL 151, goal < 70 Increase to 80 mg atorvastatin daily  Continue statin at discharge  Diabetes type II Uncontrolled Home meds:  Jardiance 25 mg daily, levemir 17 units BID, metformin 1000 mg BID HgbA1c 11.9, goal < 7.0 CBGs Diabetes coordinator consult SSI- 0-20u q4  Other Stroke Risk Factors Former cigarette smoker Obstructive sleep apnea  Other Active Problems Hyperkalemia - 5.3 - > 5.0-> 4.2 (resolved) Dysphagia on tube feeding @ [facility] day # 16  Patient seen and examined by NP/APP with MD. MD to update note as needed.   [place] , MSN, AGACNP-BC [facility] See Amion for schedule and pager  information 02/28/2021 11:38 AM   ATTENDING NOTE: I reviewed above note and agree with the assessment and plan. Pt was seen and examined.   No family at bedside today on rounding.  Patient neurologically unchanged, still follows simple commands on the right but very lethargic.  Per Dr. Lynetta Mare, later patient daughter arrived, had family meeting with Dr. Lynetta Mare, and decided go for trach next Monday.  Afebrile today, still on Ancef, heparin IV and Brilinta.  Continue tube feeding.  For detailed assessment and plan, please refer to above as I have made changes wherever appropriate.   Rosalin Hawking, MD PhD Stroke Neurology 02/28/2021 5:39 PM    To contact Stroke Continuity provider, please refer to [email or web address] After hours, contact General Neurology

## 2021-02-28 NOTE — Progress Notes (Signed)
eLink Physician-Brief Progress Note Patient Name: Taylor [name] DOB: 19-Sep-1965 MRN: [id number]   Date of Service  02/28/2021  HPI/Events of Note  Patient with oliguria, volume status is unclear, she has severe HFrEF with ejection fraction of 20 - 25 %.  eICU Interventions  Will check a BNP to try to indirectly estimate her volume status.        Taylor [name] 02/28/2021, 11:01 PM

## 2021-02-28 NOTE — Progress Notes (Signed)
RT attempted to wean patient 10/5 PS/CPAP, patient RR increased to 60s. Patient placed back on full support and is resting at this time.

## 2021-02-28 NOTE — Progress Notes (Signed)
ANTICOAGULATION CONSULT NOTE  Pharmacy Consult for Heparin Indication:  LV thrombus, s/p CVA   No Known Allergies  Patient Measurements: Height: 5\' 1"  (154.9 cm) Weight: 60 kg (132 lb 4.4 oz) IBW/kg (Calculated) : 47.8 Heparin Dosing Weight: 60kg  Vital Signs: Temp: 99.3 F (37.4 C) (01/12 1600) Temp Source: Axillary (01/12 1600) BP: 104/70 (01/12 1800) Pulse Rate: 90 (01/12 1800)  Labs: Recent Labs    02/26/21 0408 02/27/21 0333 02/28/21 0427 02/28/21 1636  HGB 13.4 13.2  --   --   HCT 44.6 45.1  --   --   PLT 342 352  --   --   HEPARINUNFRC 0.55 0.36 0.63 0.52  CREATININE 0.78 0.78  --   --     Estimated Creatinine Clearance: 65.3 mL/min (by C-G formula based on SCr of 0.78 mg/dL).   Assessment: 37 YOF continuing heparin for new LV thrombus in setting of acute CVA. S/p mechanical thrombectomy and TNK.   Heparin levels with lots of fluctuation between therapeutic and slightly supratherapeutic.  Heparin level 0.52 (acceptable) on infusion at 950 units/hr. No bleeding noted.  Goal of Therapy:  Heparin level 0.3-0.5 units/ml Monitor platelets by anticoagulation protocol: Yes   Plan:  Continue heparin infusion at 950 units/hr F/u a.m. heparin level  Sherlon Handing, PharmD, BCPS Please see amion for complete clinical pharmacist phone list 02/28/2021 6:34 PM

## 2021-02-28 NOTE — Progress Notes (Signed)
NAME:  Taylor [name], MRN:  [id number], DOB:  1965/05/02, LOS: 43 ADMISSION DATE:  02/08/2021, CONSULTATION DATE:  12/27 REFERRING MD:  Dr Tennis Must Sindy Messing , CHIEF COMPLAINT:   CVA  History of Present Illness:  56 year old female with PMH as below, which is significant for HFrEF with LVEF 20-25 % (2017), DM, and OSA on CPAP. 12/27 she presented to [facility] ED with complaints of acute onset left sided weakness and slurred speech. EMS noted non-verbal and right gaze preference. Imaging consistent with occlusion of the distal basilar arterty, right P1, and proximal R P2. She was given systemic TNK and mechanical thrombectomy  with stent retriever on the right with complete recanalization after one pass (TICI3). PCCM consulted for vent management and ICU care.    Pertinent  Medical History   has a past medical history of CHF (congestive heart failure) ([place]), Diabetes mellitus without complication ([place]), and Obstructive sleep apnea.   Significant Hospital Events: Including procedures, antibiotic start and stop dates in addition to other pertinent events   12/27 admit for posterior circulation stroke. TNK given and IR throbmectomy 12/28 echo with LV thrombus, EF 20-25%.  Started on heparin 12/30 failing weaning trials.  Started on cefepime for positive UA 1/1 On PSV weans 1/4: patient extubated and developed stridor and increase wob; required reintubation; decadron and lasix given 1/6 restarted Decadron since there is no cuff leak.  Lasix dose increased 1/9 Still waiting on family to decide on tracheostomy   Interim History / Subjective:   No overnight events.  Follows commands very weakly but will participate in therapy with much coaxing.   Objective   Blood pressure (!) 88/61, pulse 97, temperature 98.5 F (36.9 C), temperature source Axillary, resp. rate (!) 22, height 5\' 1"  (1.549 m), weight 60 kg, SpO2 99 %.    Vent Mode: CPAP;PSV FiO2 (%):  [30 %] 30 % Set Rate:  [16 bmp-18 bmp]  18 bmp Vt Set:  [380 mL] 380 mL PEEP:  [5 cmH20] 5 cmH20 Pressure Support:  [10 cmH20] 10 cmH20 Plateau Pressure:  [9 cmH20-234 cmH20] 234 cmH20   Intake/Output Summary (Last 24 hours) at 02/28/2021 1242 Last data filed at 02/28/2021 1200 Gross per 24 hour  Intake 1678.88 ml  Output 1630 ml  Net 48.88 ml    Filed Weights   02/25/21 0500 02/26/21 0500 02/28/21 0500  Weight: 60.2 kg 60.2 kg 60 kg    Examination: Gen:      No acute distress, frail, appears malnourished.  HEENT:  EOMI, sclera anicteric Neck:     No masses; no thyromegaly ET tube Lungs:    Clear to auscultation bilaterally; normal respiratory effort CV:         Regular rate and rhythm; no murmurs Abd:      + bowel sounds; soft, non-tender; no palpable masses, no distension Ext:    No edema; adequate peripheral perfusion Skin:      Warm and dry; no rash Neuro:   Somnolent, not following commands for me  Ancillary tests personally reviewed:   WBC: 15 CO2 31  Assessment & Plan:   CVA: basilar, bilateral PI/PCA. TNK given in ED and she is s/p mechanical thrombectomy on the right and mechanical thrombectomy with stent placement on the left.  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 coli UTI, MSSA pneumonia Type 2 DM  Plan:  - Continue PSV - mental status and tracheal stenosis preclude extubation.  - Will reengage family regarding tracheostomy daughter arrives. - Anticipate slow recovery.  - Heart failure appears clinically compensated.  Unable to tolerate guideline directed heart failure therapy due to hypotension requiring midodrine. -Diurese today -Complete 7 days of antibiotics for E. coli UTI and MSSA pneumonia -Continue IV heparin for LV thrombus -Continue stroke rehabilitation as tolerated -Continue secondary stroke  prevention with statin and Brilinta. -Fiber added for diarrhea.  Best Practice (right click and "Reselect all SmartList Selections" daily)   Diet/type: tubefeeds DVT prophylaxis: systemic heparin GI prophylaxis: PPI Lines: N/A Foley:  Yes, and it is still needed Code Status:  full code Last date of multidisciplinary goals of care discussion [1/6 updated brandon son over phone ]  Critical care time:   The patient is critically ill with multiple organ system failure and requires high complexity decision making for assessment and support, frequent evaluation and titration of therapies, advanced monitoring, review of radiographic studies and interpretation of complex data.   Critical Care Time devoted to patient care services, exclusive of separately billable procedures, described in this note is 35 minutes.   Kipp Brood, MD [facility] ICU Physician [place]  Pager: [phone] Or Epic Secure Chat After hours: [phone].  02/28/2021, 12:42 PM

## 2021-02-28 NOTE — Progress Notes (Signed)
ANTICOAGULATION CONSULT NOTE  Pharmacy Consult for Heparin Indication:  LV thrombus, s/p CVA   No Known Allergies  Patient Measurements: Height: 5\' 1"  (154.9 cm) Weight: 60 kg (132 lb 4.4 oz) IBW/kg (Calculated) : 47.8 Heparin Dosing Weight: 60kg  Vital Signs: Temp: 100 F (37.8 C) (01/12 0800) Temp Source: Axillary (01/12 0800) BP: 102/77 (01/12 0800) Pulse Rate: 100 (01/12 0800)  Labs: Recent Labs    02/26/21 0408 02/27/21 0333 02/28/21 0427  HGB 13.4 13.2  --   HCT 44.6 45.1  --   PLT 342 352  --   HEPARINUNFRC 0.55 0.36 0.63  CREATININE 0.78 0.78  --     Estimated Creatinine Clearance: 65.3 mL/min (by C-G formula based on SCr of 0.78 mg/dL).   Assessment: 76 YOF continuing heparin for new LV thrombus in setting of acute CVA. S/p mechanical thrombectomy and TNK.   Heparin levels fluctuating between low normal and slightly high level. Today level supratherapeutic at 0.63 for lower goal. CBC stable. No bleeding or issues with infusion per discussion with RN today.  Goal of Therapy:  Heparin level 0.3-0.5 units/ml Monitor platelets by anticoagulation protocol: Yes   Plan:  Reduce heparin infusion to 950 units/hr Check 6hr heparin level Monitor daily heparin level and CBC, s/sx bleeding   Arturo Morton, PharmD, BCPS Please check AMION for all [place] contact numbers Clinical Pharmacist 02/28/2021 9:39 AM

## 2021-03-01 DIAGNOSIS — I5020 Unspecified systolic (congestive) heart failure: Secondary | ICD-10-CM | POA: Diagnosis not present

## 2021-03-01 DIAGNOSIS — J9601 Acute respiratory failure with hypoxia: Secondary | ICD-10-CM | POA: Diagnosis not present

## 2021-03-01 DIAGNOSIS — Z978 Presence of other specified devices: Secondary | ICD-10-CM | POA: Diagnosis not present

## 2021-03-01 DIAGNOSIS — I639 Cerebral infarction, unspecified: Secondary | ICD-10-CM | POA: Diagnosis not present

## 2021-03-01 LAB — CBC
HCT: 44.2 % (ref 36.0–46.0)
Hemoglobin: 13.3 g/dL (ref 12.0–15.0)
MCH: 28.6 pg (ref 26.0–34.0)
MCHC: 30.1 g/dL (ref 30.0–36.0)
MCV: 95.1 fL (ref 80.0–100.0)
Platelets: 280 10*3/uL (ref 150–400)
RBC: 4.65 MIL/uL (ref 3.87–5.11)
RDW: 17.1 % — ABNORMAL HIGH (ref 11.5–15.5)
WBC: 13.5 10*3/uL — ABNORMAL HIGH (ref 4.0–10.5)
nRBC: 0 % (ref 0.0–0.2)

## 2021-03-01 LAB — BASIC METABOLIC PANEL
Anion gap: 6 (ref 5–15)
Anion gap: 13 (ref 5–15)
BUN: 28 mg/dL — ABNORMAL HIGH (ref 6–20)
BUN: 33 mg/dL — ABNORMAL HIGH (ref 6–20)
CO2: 32 mmol/L (ref 22–32)
CO2: 33 mmol/L — ABNORMAL HIGH (ref 22–32)
Calcium: 8 mg/dL — ABNORMAL LOW (ref 8.9–10.3)
Calcium: 9.1 mg/dL (ref 8.9–10.3)
Chloride: 104 mmol/L (ref 98–111)
Chloride: 101 mmol/L (ref 98–111)
Creatinine, Ser: 0.81 mg/dL (ref 0.44–1.00)
Creatinine, Ser: 0.82 mg/dL (ref 0.44–1.00)
GFR, Estimated: >60 mL/min (ref >60)
GFR, Estimated: >60 mL/min (ref >60)
Glucose, Bld: 151 mg/dL — ABNORMAL HIGH (ref 70–99)
Glucose, Bld: 152 mg/dL — ABNORMAL HIGH (ref 70–99)
Potassium: 4.1 mmol/L (ref 3.5–5.1)
Potassium: 3.6 mmol/L (ref 3.5–5.1)
Sodium: 142 mmol/L (ref 135–145)
Sodium: 147 mmol/L — ABNORMAL HIGH (ref 135–145)

## 2021-03-01 LAB — GLUCOSE, CAPILLARY
Glucose-Capillary: 96 mg/dL (ref 70–99)
Glucose-Capillary: 230 mg/dL — ABNORMAL HIGH (ref 70–99)
Glucose-Capillary: 168 mg/dL — ABNORMAL HIGH (ref 70–99)
Glucose-Capillary: 143 mg/dL — ABNORMAL HIGH (ref 70–99)
Glucose-Capillary: 171 mg/dL — ABNORMAL HIGH (ref 70–99)
Glucose-Capillary: 210 mg/dL — ABNORMAL HIGH (ref 70–99)

## 2021-03-01 LAB — CULTURE, BLOOD (ROUTINE X 2): Culture: NO GROWTH

## 2021-03-01 LAB — HEPARIN LEVEL (UNFRACTIONATED): Heparin Unfractionated: 0.46 IU/mL (ref 0.30–0.70)

## 2021-03-01 LAB — BRAIN NATRIURETIC PEPTIDE: B Natriuretic Peptide: 1520.9 pg/mL — ABNORMAL HIGH (ref 0.0–100.0)

## 2021-03-01 MED ORDER — POTASSIUM CHLORIDE 20 MEQ PO PACK
40.0000 meq | PACK | Freq: Once | ORAL | Status: AC
  Administered 2021-03-01: 40 meq
  Filled 2021-03-01: qty 2

## 2021-03-01 MED ORDER — FUROSEMIDE 10 MG/ML IJ SOLN
40.0000 mg | Freq: Once | INTRAMUSCULAR | Status: AC
  Administered 2021-03-01: 40 mg via INTRAVENOUS
  Filled 2021-03-01: qty 4

## 2021-03-01 MED ORDER — FUROSEMIDE 10 MG/ML IJ SOLN
20.0000 mg | Freq: Once | INTRAMUSCULAR | Status: AC
  Administered 2021-03-01: 20 mg via INTRAVENOUS
  Filled 2021-03-01: qty 2

## 2021-03-01 NOTE — Consult Note (Signed)
Taylor [name] Nov 14, 1965  [id number].    Requesting MD: Janine Ores, NP Chief Complaint/Reason for Consult: Hx CVA, Dysphagia, PEG consult   HPI: Taylor [name] is a 56 y.o. female with a hx of CHF (EF 20-25%), DM2, OSA on CPAP who presented on 12/27 with L sided weakness and slurred speech and was found to have CVA. Patient found to have LV thrombus during admission and was started on Heparin gtt. She is currently on Brilinta for anti-platelet therapy. She is being tx w/ Ancef for PNA. She has been unable to wean from vent and is planned for trach on 1/16. We were asked to see for PEG tube placement.   ROS: Review of Systems  Unable to perform ROS: Intubated   Family History  Problem Relation Age of Onset   Anemia Neg Hx    Arrhythmia Neg Hx    Asthma Neg Hx    Clotting disorder Neg Hx    Fainting Neg Hx    Heart attack Neg Hx    Heart disease Neg Hx    Heart failure Neg Hx    Hyperlipidemia Neg Hx    Hypertension Neg Hx     Past Medical History:  Diagnosis Date   CHF (congestive heart failure) ([place])    Diabetes mellitus without complication ([place])    Obstructive sleep apnea     Past Surgical History:  Procedure Laterality Date   ABDOMINAL HYSTERECTOMY     IR CT HEAD LTD  02/14/2021   IR PERCUTANEOUS ART THROMBECTOMY/INFUSION INTRACRANIAL INC DIAG ANGIO  02/04/2021   IR US GUIDE VASC ACCESS RIGHT  02/11/2021   RADIOLOGY WITH ANESTHESIA N/A 01/24/2021   Procedure: IR WITH ANESTHESIA;  Surgeon: Radiologist, Medication, MD;  Location: [place];  Service: Radiology;  Laterality: N/A;    Social History:  reports that she has quit smoking. She has never used smokeless tobacco. She reports that she does not drink alcohol and does not use drugs.  Allergies: No Known Allergies  Medications Prior to Admission  Medication Sig Dispense Refill   atorvastatin (LIPITOR) 40 MG tablet Take 1 tablet (40 mg total) by mouth daily. 90 tablet 3   carvedilol (COREG) 6.25 MG tablet Take  6.25 mg by mouth 2 (two) times daily.     furosemide (LASIX) 20 MG tablet Take 1 tablet (20 mg total) by mouth daily. Weight gain 2 pounds overnight, 5 pounds in a week, shortness of breath, or leg swelling (Patient taking differently: Take 20 mg by mouth 2 (two) times daily.) 90 tablet 3   glimepiride (AMARYL) 2 MG tablet Take 2 mg by mouth daily with breakfast.     lisinopril (ZESTRIL) 2.5 MG tablet Take 1 tablet (2.5 mg total) by mouth daily. 30 tablet 5   potassium chloride SA (KLOR-CON) 20 MEQ tablet On day 1, you will take 2 tablets in the AM and 2 tablets in the PM. Starting day 2, you will take 1 tablet every day (Patient taking differently: 20 mEq daily. On day 1, you will take 2 tablets in the AM and 2 tablets in the PM. Starting day 2, you will take 1 tablet every day) 92 tablet 3   aspirin EC 81 MG EC tablet Take 1 tablet (81 mg total) by mouth daily.     empagliflozin (JARDIANCE) 25 MG TABS tablet Take 1 tablet (25 mg total) by mouth daily. 90 tablet 3   insulin detemir (LEVEMIR) 100 unit/ml SOLN Inject 17 Units into  the skin 2 (two) times daily.      metFORMIN (GLUCOPHAGE) 1000 MG tablet Take 1 tablet (1,000 mg total) 2 (two) times daily with a meal by mouth. 180 tablet 3    Vent Mode: CPAP;PSV FiO2 (%):  [30 %] 30 % Set Rate:  [18 bmp] 18 bmp Vt Set:  [380 mL] 380 mL PEEP:  [5 cmH20] 5 cmH20 Pressure Support:  [12 cmH20] 12 cmH20 Plateau Pressure:  [15 cmH20-18 cmH20] 17 cmH20   Physical Exam: Blood pressure 118/73, pulse (!) 101, temperature 98.9 F (37.2 C), temperature source Axillary, resp. rate (!) 32, height 5\' 1"  (1.549 m), weight 60 kg, SpO2 98 %. General: intubated, WD/WN female who is laying in bed in NAD HEENT: head is normocephalic, atraumatic.  Sclera are noninjected.  Pupils equal. Tracks with eyes.  Ears and nose without any masses or lesions.  Mouth is pink and moist. Dentition fair Heart: Tachycardic with regular  rhythm.  Normal s1,s2. No obvious murmurs,  gallops, or rubs noted.  Palpable pedal pulses bilaterally  Lungs: CTAB, no wheezes, rhonchi, or rales noted.  On vent with settings as above.  Abd:  Soft, NT/ND, +BS, no masses, hernias, or organomegaly MS: no BUE/BLE edema, calves soft and nontender Skin: warm and dry with no masses, lesions, or rashes Psych: Intubated. Unable to assess further.  Neuro: Opens eyes and tracks with eyes. Does not follow other commands for me  Results for orders placed or performed during the hospital encounter of 01/22/2021 (from the past 48 hour(s))  Glucose, capillary     Status: Abnormal   Collection Time: 02/27/21  3:46 PM  Result Value Ref Range   Glucose-Capillary 169 (H) 70 - 99 mg/dL    Comment: Glucose reference range applies only to samples taken after fasting for at least 8 hours.  Glucose, capillary     Status: Abnormal   Collection Time: 02/27/21  7:32 PM  Result Value Ref Range   Glucose-Capillary 161 (H) 70 - 99 mg/dL    Comment: Glucose reference range applies only to samples taken after fasting for at least 8 hours.  Glucose, capillary     Status: Abnormal   Collection Time: 02/27/21 11:23 PM  Result Value Ref Range   Glucose-Capillary 138 (H) 70 - 99 mg/dL    Comment: Glucose reference range applies only to samples taken after fasting for at least 8 hours.  Glucose, capillary     Status: None   Collection Time: 02/28/21  3:26 AM  Result Value Ref Range   Glucose-Capillary 94 70 - 99 mg/dL    Comment: Glucose reference range applies only to samples taken after fasting for at least 8 hours.  Heparin level (unfractionated)     Status: None   Collection Time: 02/28/21  4:27 AM  Result Value Ref Range   Heparin Unfractionated 0.63 0.30 - 0.70 IU/mL    Comment: (NOTE) The clinical reportable range upper limit is being lowered to >1.10 to align with the FDA approved guidance for the current laboratory assay.  If heparin results are below expected values, and patient dosage has  been  confirmed, suggest follow up testing of antithrombin III levels. Performed at [facility], [place] [address] [place], [place] [address]   Glucose, capillary     Status: Abnormal   Collection Time: 02/28/21  7:39 AM  Result Value Ref Range   Glucose-Capillary 157 (H) 70 - 99 mg/dL    Comment: Glucose reference range applies only  to samples taken after fasting for at least 8 hours.  Glucose, capillary     Status: Abnormal   Collection Time: 02/28/21 11:54 AM  Result Value Ref Range   Glucose-Capillary 141 (H) 70 - 99 mg/dL    Comment: Glucose reference range applies only to samples taken after fasting for at least 8 hours.  Glucose, capillary     Status: Abnormal   Collection Time: 02/28/21  4:05 PM  Result Value Ref Range   Glucose-Capillary 155 (H) 70 - 99 mg/dL    Comment: Glucose reference range applies only to samples taken after fasting for at least 8 hours.  Heparin level (unfractionated)     Status: None   Collection Time: 02/28/21  4:36 PM  Result Value Ref Range   Heparin Unfractionated 0.52 0.30 - 0.70 IU/mL    Comment: (NOTE) The clinical reportable range upper limit is being lowered to >1.10 to align with the FDA approved guidance for the current laboratory assay.  If heparin results are below expected values, and patient dosage has  been confirmed, suggest follow up testing of antithrombin III levels. Performed at [facility], [place] [address], [place], [place] [address]   Glucose, capillary     Status: Abnormal   Collection Time: 02/28/21  7:25 PM  Result Value Ref Range   Glucose-Capillary 177 (H) 70 - 99 mg/dL    Comment: Glucose reference range applies only to samples taken after fasting for at least 8 hours.  Glucose, capillary     Status: Abnormal   Collection Time: 02/28/21 11:18 PM  Result Value Ref Range   Glucose-Capillary 176 (H) 70 - 99 mg/dL    Comment: Glucose reference range applies only to samples taken after fasting for at least 8  hours.  Heparin level (unfractionated)     Status: None   Collection Time: 03/01/21 12:40 AM  Result Value Ref Range   Heparin Unfractionated 0.46 0.30 - 0.70 IU/mL    Comment: (NOTE) The clinical reportable range upper limit is being lowered to >1.10 to align with the FDA approved guidance for the current laboratory assay.  If heparin results are below expected values, and patient dosage has  been confirmed, suggest follow up testing of antithrombin III levels. Performed at [facility], [place] [address] [place], [place] [address]   CBC     Status: Abnormal   Collection Time: 03/01/21 12:40 AM  Result Value Ref Range   WBC 13.5 (H) 4.0 - 10.5 K/uL   RBC 4.65 3.87 - 5.11 MIL/uL   Hemoglobin 13.3 12.0 - 15.0 g/dL   HCT 44.2 36.0 - 46.0 %   MCV 95.1 80.0 - 100.0 fL   MCH 28.6 26.0 - 34.0 pg   MCHC 30.1 30.0 - 36.0 g/dL   RDW 17.1 (H) 11.5 - 15.5 %   Platelets 280 150 - 400 K/uL   nRBC 0.0 0.0 - 0.2 %    Comment: Performed at [facility], [place] [address] [place], [place] [id number]  Basic metabolic panel     Status: Abnormal   Collection Time: 03/01/21 12:40 AM  Result Value Ref Range   Sodium 142 135 - 145 mmol/L   Potassium 4.1 3.5 - 5.1 mmol/L   Chloride 104 98 - 111 mmol/L   CO2 32 22 - 32 mmol/L   Glucose, Bld 151 (H) 70 - 99 mg/dL    Comment: Glucose reference range applies only to samples taken after fasting for  at least 8 hours.   BUN 28 (H) 6 - 20 mg/dL   Creatinine, Ser 0.81 0.44 - 1.00 mg/dL   Calcium 8.0 (L) 8.9 - 10.3 mg/dL   GFR, Estimated >60 >60 mL/min    Comment: (NOTE) Calculated using the CKD-EPI Creatinine Equation (2021)    Anion gap 6 5 - 15    Comment: Performed at [facility] [address] [place], [place] [address]  Brain natriuretic peptide     Status: Abnormal   Collection Time: 03/01/21 12:40 AM  Result Value Ref Range   B Natriuretic Peptide 1,520.9 (H) 0.0 - 100.0 pg/mL    Comment: Performed at [facility] [address], [place], [place] [address]  Glucose, capillary     Status: None   Collection Time: 03/01/21  3:11 AM  Result Value Ref Range   Glucose-Capillary 96 70 - 99 mg/dL    Comment: Glucose reference range applies only to samples taken after fasting for at least 8 hours.  Glucose, capillary     Status: Abnormal   Collection Time: 03/01/21  8:17 AM  Result Value Ref Range   Glucose-Capillary 230 (H) 70 - 99 mg/dL    Comment: Glucose reference range applies only to samples taken after fasting for at least 8 hours.  Glucose, capillary     Status: Abnormal   Collection Time: 03/01/21 11:48 AM  Result Value Ref Range   Glucose-Capillary 168 (H) 70 - 99 mg/dL    Comment: Glucose reference range applies only to samples taken after fasting for at least 8 hours.   No results found.  Anti-infectives (From admission, onward)    Start     Dose/Rate Route Frequency Ordered Stop   02/26/21 1400  ceFAZolin (ANCEF) IVPB 2g/100 mL premix        2 g 200 mL/hr over 30 Minutes Intravenous Every 8 hours 02/26/21 1239 03/02/21 2359   02/25/21 1400  ceFEPIme (MAXIPIME) 2 g in sodium chloride 0.9 % 100 mL IVPB  Status:  Discontinued        2 g 200 mL/hr over 30 Minutes Intravenous Every 8 hours 02/25/21 1253 02/26/21 1239   02/25/21 1200  vancomycin (VANCOREADY) IVPB 1250 mg/250 mL  Status:  Discontinued        1,250 mg 166.7 mL/hr over 90 Minutes Intravenous Every 24 hours 02/24/21 1024 02/25/21 0917   02/24/21 2000  ceFEPIme (MAXIPIME) 2 g in sodium chloride 0.9 % 100 mL IVPB  Status:  Discontinued        2 g 200 mL/hr over 30 Minutes Intravenous Every 8 hours 02/24/21 1024 02/25/21 0918   02/24/21 1015  ceFEPIme (MAXIPIME) 2 g in sodium chloride 0.9 % 100 mL IVPB        2 g 200 mL/hr over 30 Minutes Intravenous  Once 02/24/21 0925 02/24/21 1055   02/24/21 1015  vancomycin (VANCOREADY) IVPB 1250 mg/250 mL        1,250 mg 166.7 mL/hr over 90 Minutes Intravenous  Once 02/24/21 0925 02/24/21 1256    02/18/21 2200  ceFAZolin (ANCEF) IVPB 2g/100 mL premix        2 g 200 mL/hr over 30 Minutes Intravenous Every 8 hours 02/18/21 0946 02/21/21 2213   02/17/21 1000  cefTRIAXone (ROCEPHIN) 1 g in sodium chloride 0.9 % 100 mL IVPB  Status:  Discontinued        1 g 200 mL/hr over 30 Minutes Intravenous Every 24 hours 02/17/21 0905 02/18/21  [id number]   02/15/21 1000  ceFEPIme (MAXIPIME) 2 g in sodium chloride 0.9 % 100 mL IVPB  Status:  Discontinued        2 g 200 mL/hr over 30 Minutes Intravenous Every 8 hours 02/15/21 0941 02/17/21 0905       Assessment/Plan Hx CVA w/ Dysphagia  - Will plan to coordinate with CCM for PEG tube placement the same day they are doing Trach (currently planned for Monday). I discussed with family (daughter w/ son's on FT) at bedside including indications and risks. Risk includes but are not limited to anesthesia (MI, CVA, Death), bleeding, pain, infection, scarring, and PEG tube complications (leakage, skin irritation, dislodgement and possible need for exchanges). Discussed that PEG tube will need to stay in place for at least 6-8 weeks but can stay in place long term if needed. Discussed with MD, okay to continue Brilinta. Please hold heparin at midnight prior to the procedure. Please hold TF's at midnight prior to the procedure.   Moderate Medical Decision Making  Jillyn Ledger, [facility] Surgery 03/01/2021, 1:20 PM Please see Amion for pager number during day hours 7:00am-4:30pm

## 2021-03-01 NOTE — Progress Notes (Addendum)
STROKE TEAM PROGRESS NOTE   INTERVAL HISTORY:  Patient is seen in her room with daughter at the bedside. Plan for tracheostomy on Monday.  She has been hemodynamically stable and her neurological exam remains stable.   Awake today, following simple commands with fingers and toes, but would not hold arm up. Heparin IV running trauma consulted for PEG tube placement after tracheostomy.   Vitals:   03/01/21 0600 03/01/21 0700 03/01/21 0754 03/01/21 0800  BP: 101/77 108/78  112/75  Pulse: (!) 101 (!) 101 100 100  Resp: 19 20 (!) 30 (!) 30  Temp:    99.8 F (37.7 C)  TempSrc:    Axillary  SpO2: 98% 99% 99% 99%  Weight:      Height:       CBC:  Recent Labs  Lab 02/27/21 0333 03/01/21 0040  WBC 15.4* 13.5*  HGB 13.2 13.3  HCT 45.1 44.2  MCV 95.6 95.1  PLT 352 [id number]    Basic Metabolic Panel:  Recent Labs  Lab 02/27/21 0333 03/01/21 0040  NA 140 142  K 4.1 4.1  CL 99 104  CO2 31 32  GLUCOSE 181* 151*  BUN 30* 28*  CREATININE 0.78 0.81  CALCIUM 8.5* 8.0*  MG 2.6*  --   PHOS 2.8  --     Lipid Panel:  No results for input(s): CHOL, TRIG, HDL, CHOLHDL, VLDL, LDLCALC in the last 168 hours.  HgbA1c:  No results for input(s): HGBA1C in the last 168 hours.  Urine Drug Screen: No results for input(s): LABOPIA, COCAINSCRNUR, LABBENZ, AMPHETMU, THCU, LABBARB in the last 168 hours.  Alcohol Level  No results for input(s): ETH in the last 168 hours.   IMAGING past 24 hours No results found.  PHYSICAL EXAM  Temp:  [98.3 F (36.8 C)-100.5 F (38.1 C)] 99.8 F (37.7 C) (01/13 0800) Pulse Rate:  [86-108] 100 (01/13 0800) Resp:  [18-30] 30 (01/13 0800) BP: (88-112)/(61-78) 112/75 (01/13 0800) SpO2:  [97 %-100 %] 99 % (01/13 0800) FiO2 (%):  [30 %] 30 % (01/13 0754)  General - [name], intubated off sedation.  Cardiovascular - Regular rhythm but tachycardia.  Neuro - intubated off sedation, awake alert but very lethargic, follows most commands  but less active.  Will nod in respose to simple questions  Right gaze preference barely cross midline.  PERRL, does not blink to threat on left side. Sensation, coordination and gait not tested.   ASSESSMENT/PLAN [name] is a 56 y.o. female with history of CHF, T2DM and OSA presenting with acute onset left-sided weakness, right gaze deviation and slurred speech. She was found to have occlusion of the distal basilar artery, right P1 and proximal right P2.  She was given TNK and received mechanical thrombectomy to the right and left PCA with stenting on the left. She was found to have two apical thrombi on her 2D echocardiogram and remains on IV heparin. Extubated and then emergently reintubated on 02/20/2021.  She weaned on pressure support 5/5 with an FiO2 30% on 1/8, however unable to wean in the morning on 1/9.  CCM and steroid and diuretic course completed 02/23/2021 for swollen airway. WBC 18.0, respiratory, blood, and urine cultures sent. Vanco and cefepime started 02/24/2021. Per family conversation, plan for tracheostomy on Monday.   Stroke:  embolic shower with b/l PCA occlusion s/p IR with TICI3 and left PCA stenting, likely secondary due to embolism from low EF and apical thrombi Code Stroke CT head  No acute abnormality.  ASPECTS 10.    CTA head & neck nonopacification of distal basilar artery, right P1 and P2, poor opacification of right vertebral artery, focal stenosis in right A1 and A3 IR with b/l P1 occlusion s/p thrombectomy with TICI3 and left PCA stent MRI  Acute right PCA territory infarct, in right thalamus and occipital lobe, small acute infarcts in bilateral cerebellar hemispheres with scattered small acute infarcts in bilateral parietal lobes, left occipital and left frontal lobes, area of susceptibility infarct in right occipital lobe suggestive of petechial hemorrhage MRA thrombectomy and placement of left P1/P2 stent with good flow related signal in distal left PCA, poor flow  signal in distal basilar tip, poor opacification of right vertebral artery, right A1 and A3 ACA stenosis 2D Echo global hypokinesis with inferior akinesis, EF 0000000, grade 3 diastolic dysfunction, thrombi in inferoapical and anteroapical areas, no atrial level shunt LDL 151 HgbA1c 11.9 VTE prophylaxis -Heparin IV aspirin 81 mg daily prior to admission, Now on heparin IV and Brilinta.  Transition to PO AC once extubated with p.o. access. 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 UA WBC > 50 Urine culture Gram neg rods. 02/17/21 E Coli Sensitive to all antibiotics tested On cefepime -completed course Management per CCM on vanco and cefepime 02/24/2021 -> ancef  Hyperlipidemia Home meds:  atorvastatin 40 mg daily, resumed in hospital LDL 151, goal < 70 Increase to 80 mg atorvastatin daily  Continue statin at discharge  Diabetes type II Uncontrolled Home meds:  Jardiance 25 mg daily, levemir 17 units BID, metformin 1000 mg BID HgbA1c 11.9, goal < 7.0 CBGs Diabetes coordinator consult SSI- 0-20u q4  Other Stroke Risk Factors Former cigarette smoker Obstructive sleep apnea  Other Active Problems Hyperkalemia - 5.3 - > 5.0-> 4.2 (resolved) Dysphagia on tube feeding @ [facility] day # 17  Patient seen and examined by NP/APP with MD.  MD to update note as needed.   Janine Ores, DNP, FNP-BC [facility] Pager: [phone]   ATTENDING NOTE: I reviewed above note and agree with the assessment and plan. Pt was seen and examined.   Daughter at bedside.  Patient still intubated, off sedation, lethargic but able to follow simple commands.  Neurologically unchanged.  No cuff leak, not into extubate.  Still on Brilinta and heparin IV.  Low-grade fever with T-max 100.2, mild tachycardia stable.  Daughter and son has decided tracheostomy next Monday.  We will also consider PEG tube early next week.  Once procedure done, heparin IV can transition to p.o. DOAC.   For detailed assessment and plan, please refer to above as I have made changes wherever appropriate.   Rosalin Hawking, MD PhD Stroke Neurology 03/01/2021 7:30 PM  This patient is critically ill due to LV thrombus, right MCA and PCA stroke, cardiomyopathy with low EF, bilateral PCA occlusion status post IR and stenting, respiratory failure and  at significant risk of neurological worsening, death form recurrent stroke, hemorrhagic transformation, heart failure, cardiac arrest, bleeding from heparin IV and Brilinta. This patient's care requires constant monitoring of vital signs, hemodynamics, respiratory and cardiac monitoring, review of multiple databases, neurological assessment, discussion with family, other specialists and medical decision making of high complexity. I spent 30 minutes of neurocritical care time in the care of this patient. I had discussion with daughter at bedside, updated pt current condition, treatment plan and potential prognosis, and answered all the questions.  She expressed understanding and appreciation.       To contact Stroke Continuity provider, please refer to [email or web address] After hours, contact General Neurology

## 2021-03-01 NOTE — Progress Notes (Signed)
eLink Physician-Brief Progress Note Patient Name: Taylor [name] DOB: 04/21/1965 MRN: [id number]   Date of Service  03/01/2021  HPI/Events of Note  BNP 1520.  eICU Interventions  Lasix 20 mg iv x 1 ordered.        Thomasene Lot Kensi Karr 03/01/2021, 4:51 AM

## 2021-03-01 NOTE — Progress Notes (Signed)
[place] Progress Note Patient Name: Taylor [name] DOB: 06/16/1965 MRN: [id number]   Date of Service  03/01/2021  HPI/Events of Note  Patient with soft blood pressures.  eICU Interventions  Nursing communication order entered instructing bedside RN to give scheduled Midodrine dose early.        Kerry Kass Hezakiah Champeau 03/01/2021, 8:09 PM

## 2021-03-01 NOTE — Progress Notes (Addendum)
ANTICOAGULATION CONSULT NOTE  Pharmacy Consult for Heparin Indication:  LV thrombus, s/p CVA   No Known Allergies  Patient Measurements: Height: 5\' 1"  (154.9 cm) Weight: 60 kg (132 lb 4.4 oz) IBW/kg (Calculated) : 47.8 Heparin Dosing Weight: 60kg  Vital Signs: Temp: 100.2 F (37.9 C) (01/13 0400) Temp Source: Oral (01/13 0400) BP: 101/77 (01/13 0600) Pulse Rate: 101 (01/13 0600)  Labs: Recent Labs    02/27/21 0333 02/28/21 0427 02/28/21 1636 03/01/21 0040  HGB 13.2  --   --  13.3  HCT 45.1  --   --  44.2  PLT 352  --   --  280  HEPARINUNFRC 0.36 0.63 0.52 0.46  CREATININE 0.78  --   --  0.81    Estimated Creatinine Clearance: 64.5 mL/min (by C-G formula based on SCr of 0.81 mg/dL).   Assessment: 30 YOF continuing heparin for new LV thrombus in setting of acute CVA. S/p mechanical thrombectomy and TNK.   Heparin levels with lots of fluctuation between therapeutic and slightly supratherapeutic. Heparin level 0.46 on infusion at 950 units/hr. CBC WNL. No bleeding noted.  Goal of Therapy:  Heparin level 0.3-0.5 units/ml Monitor platelets by anticoagulation protocol: Yes   Plan:  Continue heparin at 950 units/hr Monitor daily heparin level and CBC, s/sx bleeding   Arturo Morton, PharmD, BCPS Please check AMION for all [place] contact numbers Clinical Pharmacist 03/01/2021 7:32 AM   ADDENDUM - Pharmacy consulted to hold heparin on 1/15 at 2359 for PEG placement per consult from Trauma. Stop time entered.  Arturo Morton, PharmD, BCPS Please check AMION for all [place] contact numbers Clinical Pharmacist 03/01/2021 3:35 PM

## 2021-03-01 NOTE — Progress Notes (Signed)
ANTICOAGULATION CONSULT NOTE  Pharmacy Consult for Heparin Indication:  LV thrombus, s/p CVA   No Known Allergies  Patient Measurements: Height: 5\' 1"  (154.9 cm) Weight: 60 kg (132 lb 4.4 oz) IBW/kg (Calculated) : 47.8 Heparin Dosing Weight: 60kg  Vital Signs: Temp: 100.5 F (38.1 C) (01/13 0000) Temp Source: Oral (01/13 0000) BP: 105/75 (01/12 2000) Pulse Rate: 99 (01/12 2000)  Labs: Recent Labs    02/26/21 0408 02/27/21 0333 02/28/21 0427 02/28/21 1636 03/01/21 0040  HGB 13.4 13.2  --   --  13.3  HCT 44.6 45.1  --   --  44.2  PLT 342 352  --   --  280  HEPARINUNFRC 0.55 0.36 0.63 0.52 0.46  CREATININE 0.78 0.78  --   --  0.81    Estimated Creatinine Clearance: 64.5 mL/min (by C-G formula based on SCr of 0.81 mg/dL).   Assessment: 1 YOF continuing heparin for new LV thrombus in setting of acute CVA. S/p mechanical thrombectomy and TNK.   Heparin levels with lots of fluctuation between therapeutic and slightly supratherapeutic.  Heparin level 0.52 (acceptable) on infusion at 950 units/hr. No bleeding noted.  1/13 AM update:  Heparin level at goal  Goal of Therapy:  Heparin level 0.3-0.5 units/ml Monitor platelets by anticoagulation protocol: Yes   Plan:  Cont heparin at 950 units/hr Daily CBC and heparin level  Narda Bonds, PharmD, BCPS Clinical Pharmacist Phone: [phone]

## 2021-03-01 NOTE — Progress Notes (Signed)
NAME:  Taylor [name], MRN:  [id number], DOB:  1965-12-19, LOS: 43 ADMISSION DATE:  02/13/2021, CONSULTATION DATE:  12/27 REFERRING MD:  Dr Taylor [name] , CHIEF COMPLAINT:   CVA  History of Present Illness:  56 year old female with PMH as below, which is significant for HFrEF with LVEF 20-25 % (2017), DM, and OSA on CPAP. 12/27 she presented to [facility] ED with complaints of acute onset left sided weakness and slurred speech. EMS noted non-verbal and right gaze preference. Imaging consistent with occlusion of the distal basilar arterty, right P1, and proximal R P2. She was given systemic TNK and mechanical thrombectomy  with stent retriever on the right with complete recanalization after one pass (TICI3). PCCM consulted for vent management and ICU care.    Pertinent  Medical History   has a past medical history of CHF (congestive heart failure) ([place]), Diabetes mellitus without complication ([place]), and Obstructive sleep apnea.   Significant Hospital Events: Including procedures, antibiotic start and stop dates in addition to other pertinent events   12/27 admit for posterior circulation stroke. TNK given and IR throbmectomy 12/28 echo with LV thrombus, EF 20-25%.  Started on heparin 12/30 failing weaning trials.  Started on cefepime for positive UA 1/1 On PSV weans 1/4: patient extubated and developed stridor and increase wob; required reintubation; decadron and lasix given 1/6 restarted Decadron since there is no cuff leak.  Lasix dose increased 1/9 Still waiting on family to decide on tracheostomy  1/13 Diuresing, urinary retention foley placed  Interim History / Subjective:   No overnight events.  Follows commands very weakly, participating with therapy. Foley placed for urinary retention, ongoing issue.  Objective   Blood pressure 101/67, pulse (!) 101, temperature 98.9 F (37.2 C), temperature source Axillary, resp. rate 20, height 5\' 1"  (1.549 m), weight 60 kg, SpO2 97 %.     Vent Mode: PRVC FiO2 (%):  [30 %] 30 % Set Rate:  [18 bmp] 18 bmp Vt Set:  [380 mL] 380 mL PEEP:  [5 cmH20] 5 cmH20 Pressure Support:  [12 cmH20] 12 cmH20 Plateau Pressure:  [15 cmH20-18 cmH20] 16 cmH20   Intake/Output Summary (Last 24 hours) at 03/01/2021 1607 Last data filed at 03/01/2021 1500 Gross per 24 hour  Intake 1733.14 ml  Output 1620 ml  Net 113.14 ml    Filed Weights   02/25/21 0500 02/26/21 0500 02/28/21 0500  Weight: 60.2 kg 60.2 kg 60 kg    Examination: Gen:      No acute distress, frail, appears malnourished.  HEENT:  EOMI, sclera anicteric Neck:     No masses; no thyromegaly ET tube Lungs:    Clear to auscultation bilaterally; normal respiratory effort CV:         Regular rate and rhythm; no murmurs Abd:      + bowel sounds; soft, non-tender; no palpable masses, no distension Ext:    No edema; adequate peripheral perfusion Skin:      Warm and dry; no rash Neuro:   Somnolent, not following commands for me    Assessment & Plan:   CVA: basilar, bilateral PI/PCA. TNK given in ED and she is s/p mechanical thrombectomy on the right and mechanical thrombectomy with stent placement on the left.  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 coli  UTI, MSSA pneumonia Type 2 DM  Plan:  - Continue PSV - mental status and tracheal stenosis preclude extubation.  - Plan tracheostomy and PEG bedside 10 am 1/16 - Anticipate slow recovery.  -  Unable to tolerate guideline directed heart failure therapy due to hypotension requiring midodrine. -Continue to diurese 1/13 -Complete 7 days of antibiotics for E. coli UTI and MSSA pneumonia -Continue IV heparin for LV thrombus -Continue stroke rehabilitation as tolerated -Continue secondary stroke prevention with statin and Brilinta.  Best  Practice (right click and "Reselect all SmartList Selections" daily)   Diet/type: tubefeeds DVT prophylaxis: systemic heparin GI prophylaxis: PPI Lines: N/A Foley:  Yes, and it is still needed Code Status:  full code Last date of multidisciplinary goals of care discussion [1/6 updated brandon son over phone ]  Critical care time:   CRITICAL CARE Performed by: Taylor [name]   Total critical care time: 32 minutes  Critical care time was exclusive of separately billable procedures and treating other patients.  Critical care was necessary to treat or prevent imminent or life-threatening deterioration.  Critical care was time spent personally by me on the following activities: development of treatment plan with patient and/or surrogate as well as nursing, discussions with consultants, evaluation of patient's response to treatment, examination of patient, obtaining history from patient or surrogate, ordering and performing treatments and interventions, ordering and review of laboratory studies, ordering and review of radiographic studies, pulse oximetry and re-evaluation of patient's condition.   Taylor Clam, MD See Amion for contact info  03/01/2021, 4:07 PM

## 2021-03-02 ENCOUNTER — Inpatient Hospital Stay (HOSPITAL_COMMUNITY): Payer: 59

## 2021-03-02 DIAGNOSIS — I639 Cerebral infarction, unspecified: Secondary | ICD-10-CM | POA: Diagnosis not present

## 2021-03-02 LAB — GLUCOSE, CAPILLARY
Glucose-Capillary: 147 mg/dL — ABNORMAL HIGH (ref 70–99)
Glucose-Capillary: 153 mg/dL — ABNORMAL HIGH (ref 70–99)
Glucose-Capillary: 179 mg/dL — ABNORMAL HIGH (ref 70–99)
Glucose-Capillary: 183 mg/dL — ABNORMAL HIGH (ref 70–99)
Glucose-Capillary: 188 mg/dL — ABNORMAL HIGH (ref 70–99)
Glucose-Capillary: 226 mg/dL — ABNORMAL HIGH (ref 70–99)

## 2021-03-02 LAB — HEPARIN LEVEL (UNFRACTIONATED)
Heparin Unfractionated: 0.67 IU/mL (ref 0.30–0.70)
Heparin Unfractionated: 0.74 IU/mL — ABNORMAL HIGH (ref 0.30–0.70)

## 2021-03-02 LAB — URINALYSIS, MICROSCOPIC (REFLEX)
Bacteria, UA: NONE SEEN
RBC / HPF: >50 RBC/hpf (ref 0–5)
Squamous Epithelial / HPF: NONE SEEN (ref 0–5)

## 2021-03-02 LAB — CBC
HCT: 48.7 % — ABNORMAL HIGH (ref 36.0–46.0)
Hemoglobin: 14.7 g/dL (ref 12.0–15.0)
MCH: 28.8 pg (ref 26.0–34.0)
MCHC: 30.2 g/dL (ref 30.0–36.0)
MCV: 95.5 fL (ref 80.0–100.0)
Platelets: 256 10*3/uL (ref 150–400)
RBC: 5.1 MIL/uL (ref 3.87–5.11)
RDW: 17.9 % — ABNORMAL HIGH (ref 11.5–15.5)
WBC: 16.1 10*3/uL — ABNORMAL HIGH (ref 4.0–10.5)
nRBC: 0.4 % — ABNORMAL HIGH (ref 0.0–0.2)

## 2021-03-02 LAB — BASIC METABOLIC PANEL
Anion gap: 13 (ref 5–15)
BUN: 41 mg/dL — ABNORMAL HIGH (ref 6–20)
CO2: 26 mmol/L (ref 22–32)
Calcium: 8.4 mg/dL — ABNORMAL LOW (ref 8.9–10.3)
Chloride: 106 mmol/L (ref 98–111)
Creatinine, Ser: 0.98 mg/dL (ref 0.44–1.00)
GFR, Estimated: >60 mL/min (ref >60)
Glucose, Bld: 173 mg/dL — ABNORMAL HIGH (ref 70–99)
Potassium: 4.5 mmol/L (ref 3.5–5.1)
Sodium: 145 mmol/L (ref 135–145)

## 2021-03-02 LAB — URINALYSIS, ROUTINE W REFLEX MICROSCOPIC
Bilirubin Urine: NEGATIVE
Glucose, UA: NEGATIVE mg/dL
Ketones, ur: NEGATIVE mg/dL
Leukocytes,Ua: NEGATIVE
Nitrite: NEGATIVE
Protein, ur: >300 mg/dL — AB
Specific Gravity, Urine: 1.02 (ref 1.005–1.030)
pH: 7.5 (ref 5.0–8.0)

## 2021-03-02 MED ORDER — SODIUM CHLORIDE 0.9 % IV SOLN
2.0000 g | Freq: Two times a day (BID) | INTRAVENOUS | Status: DC
  Administered 2021-03-02 – 2021-03-04 (×4): 2 g via INTRAVENOUS
  Filled 2021-03-02 (×4): qty 2

## 2021-03-02 MED ORDER — PANCRELIPASE (LIP-PROT-AMYL) 10440-39150 UNITS PO TABS
20880.0000 [IU] | ORAL_TABLET | Freq: Once | ORAL | Status: AC
  Administered 2021-03-02: 20880 [IU]
  Filled 2021-03-02: qty 2

## 2021-03-02 MED ORDER — LACTATED RINGERS IV BOLUS
500.0000 mL | Freq: Once | INTRAVENOUS | Status: AC
  Administered 2021-03-02: 500 mL via INTRAVENOUS

## 2021-03-02 MED ORDER — SODIUM BICARBONATE 650 MG PO TABS
650.0000 mg | ORAL_TABLET | Freq: Once | ORAL | Status: AC
  Administered 2021-03-02: 650 mg
  Filled 2021-03-02: qty 1

## 2021-03-02 NOTE — Progress Notes (Signed)
Sputum sample obtained and sent to lab.  

## 2021-03-02 NOTE — Progress Notes (Signed)
ANTICOAGULATION CONSULT NOTE  Pharmacy Consult for Heparin Indication:  LV thrombus, s/p CVA   No Known Allergies  Patient Measurements: Height: 5\' 1"  (154.9 cm) Weight: 60 kg (132 lb 4.4 oz) IBW/kg (Calculated) : 47.8 Heparin Dosing Weight: 60kg  Vital Signs: Temp: 101.9 F (38.8 C) (01/14 0800) Temp Source: Oral (01/14 0800) BP: 121/83 (01/14 0727) Pulse Rate: 109 (01/14 0727)  Labs: Recent Labs    02/28/21 1636 03/01/21 0040 03/01/21 1458 03/02/21 0638 03/02/21 0733  HGB  --  13.3  --  14.7  --   HCT  --  44.2  --  48.7*  --   PLT  --  280  --  256  --   HEPARINUNFRC 0.52 0.46  --   --  0.67  CREATININE  --  0.81 0.82 0.98  --     Estimated Creatinine Clearance: 52.7 mL/min (by C-G formula based on SCr of 0.98 mg/dL).   Assessment: 28 YOF continuing heparin for new LV thrombus in setting of acute CVA. S/p mechanical thrombectomy and TNK.   Heparin levels with lots of fluctuation between low therapeutic and supra-therapeutic levels. Currently supra-therapeutic at 0.67 units/mL on infusion at 950 units/hr. CBC WNL; no bleeding noted.  Goal of Therapy:  Heparin level 0.3-0.5 units/ml Monitor platelets by anticoagulation protocol: Yes   Plan:  Reduce heparin gtt to 850 units/hr Check 6 hr heparin level Daily heparin level and CBC   Maryn Freelove D. Mina Marble, PharmD, BCPS, [place] 03/02/2021, 8:48 AM

## 2021-03-02 NOTE — Progress Notes (Signed)
PHARMACY NOTE:  ANTIMICROBIAL RENAL DOSAGE ADJUSTMENT  Current antimicrobial regimen includes a mismatch between antimicrobial dosage and estimated renal function.  As per policy approved by the Pharmacy & Therapeutics and Medical Executive Committees, the antimicrobial dosage will be adjusted accordingly.  Current antimicrobial dosage:  cefepime 2g q8h  Indication: fevers  Renal Function:  Estimated Creatinine Clearance: 52.7 mL/min (by C-G formula based on SCr of 0.98 mg/dL). []      On intermittent HD, scheduled: []      On CRRT    Antimicrobial dosage has been changed to:  cefepime 2g q12h  Additional comments:   Thank you for allowing pharmacy to be a part of this patient's care.  , [facility] 03/02/2021 3:46 PM

## 2021-03-02 NOTE — Progress Notes (Signed)
Brief PCCM Progress Note  Continues to fever, HR more elevated, tachypneic. Likely developing sepsis. CXR clear. Changing cefazolin to cefepime, will cover known MSSA and add gram negative coverage. Blood cultures and sputum cultures pending. UA ordered. LR 500 cc bolus given tachycardia and prior aggressive diuresis. Cautious fluids with known cardiopmyopathy

## 2021-03-02 NOTE — Progress Notes (Signed)
ANTICOAGULATION CONSULT NOTE  Pharmacy Consult for Heparin Indication:  LV thrombus, s/p CVA   No Known Allergies  Patient Measurements: Height: 5\' 1"  (154.9 cm) Weight: 60 kg (132 lb 4.4 oz) IBW/kg (Calculated) : 47.8 Heparin Dosing Weight: 60kg  Vital Signs: Temp: 102.7 F (39.3 C) (01/14 1700) Temp Source: Axillary (01/14 1700) BP: 108/74 (01/14 1700) Pulse Rate: 101 (01/14 1700)  Labs: Recent Labs    03/01/21 0040 03/01/21 1458 03/02/21 0638 03/02/21 0733 03/02/21 1552  HGB 13.3  --  14.7  --   --   HCT 44.2  --  48.7*  --   --   PLT 280  --  256  --   --   HEPARINUNFRC 0.46  --   --  0.67 0.74*  CREATININE 0.81 0.82 0.98  --   --     Estimated Creatinine Clearance: 52.7 mL/min (by C-G formula based on SCr of 0.98 mg/dL).   Assessment: 55 YOF continuing heparin for new LV thrombus in setting of acute CVA. S/p mechanical thrombectomy and TNK.   Heparin levels with lots of fluctuation between low therapeutic and supra-therapeutic levels. Repeat level this afternoon is supratherapeutic at 0.74.  Goal of Therapy:  Heparin level 0.3-0.5 units/ml Monitor platelets by anticoagulation protocol: Yes   Plan:  Reduce heparin gtt to 700 units/hr Check 6 hr heparin level Daily heparin level and CBC   03/01/2021, PharmD, [place], [facility] Clinical Pharmacist [phone] Please check AMION for all [facility] Pharmacy numbers 03/02/2021

## 2021-03-02 NOTE — Progress Notes (Signed)
NAME:  Taylor [name], MRN:  [id number], DOB:  07-Aug-1965, LOS: 18 ADMISSION DATE:  02/13/2021, CONSULTATION DATE:  12/27 REFERRING MD:  Dr Tommi Rumps Melchor Amour , CHIEF COMPLAINT:   CVA  History of Present Illness:  56 year old female with PMH as below, which is significant for HFrEF with LVEF 20-25 % (2017), DM, and OSA on CPAP. 12/27 she presented to [facility] ED with complaints of acute onset left sided weakness and slurred speech. EMS noted non-verbal and right gaze preference. Imaging consistent with occlusion of the distal basilar arterty, right P1, and proximal R P2. She was given systemic TNK and mechanical thrombectomy  with stent retriever on the right with complete recanalization after one pass (TICI3). PCCM consulted for vent management and ICU care.    Pertinent  Medical History   has a past medical history of CHF (congestive heart failure) (HCC), Diabetes mellitus without complication (HCC), and Obstructive sleep apnea.   Significant Hospital Events: Including procedures, antibiotic start and stop dates in addition to other pertinent events   12/27 admit for posterior circulation stroke. TNK given and IR throbmectomy 12/28 echo with LV thrombus, EF 20-25%.  Started on heparin 12/30 failing weaning trials.  Started on cefepime for positive UA 1/1 On PSV weans 1/4: patient extubated and developed stridor and increase wob; required reintubation; decadron and lasix given 1/6 restarted Decadron since there is no cuff leak.  Lasix dose increased 1/9 Still waiting on family to decide on tracheostomy  1/13 Diuresing, urinary retention foley placed  Interim History / Subjective:   Febrile, above baseline that runs high.  Objective   Blood pressure 113/81, pulse (!) 107, temperature (!) 101.9 F (38.8 C), temperature source Oral, resp. rate (!) 28, height 5\' 1"  (1.549 m), weight 60 kg, SpO2 98 %.    Vent Mode: CPAP;PSV FiO2 (%):  [30 %] 30 % Set Rate:  [18 bmp] 18 bmp Vt Set:   [380 mL] 380 mL PEEP:  [5 cmH20] 5 cmH20 Pressure Support:  [12 cmH20-16 cmH20] 16 cmH20 Plateau Pressure:  [16 cmH20-18 cmH20] 17 cmH20   Intake/Output Summary (Last 24 hours) at 03/02/2021 0947 Last data filed at 03/02/2021 0800 Gross per 24 hour  Intake 1548.24 ml  Output 2479 ml  Net -930.76 ml    Filed Weights   02/25/21 0500 02/26/21 0500 02/28/21 0500  Weight: 60.2 kg 60.2 kg 60 kg    Examination: Gen:      No acute distress, frail, appears malnourished.  HEENT:  EOMI, sclera anicteric Neck:     No masses; no thyromegaly ET tube Lungs:    Clear to auscultation bilaterally; normal respiratory effort CV:         Regular rate and rhythm; no murmurs Abd:      + bowel sounds; soft, non-tender; no palpable masses, no distension Ext:    No edema; adequate peripheral perfusion Skin:      Warm and dry; no rash Neuro:   Somnolent, not following commands for me    Assessment & Plan:   CVA: basilar, bilateral PI/PCA. TNK given in ED and she is s/p mechanical thrombectomy on the right and mechanical thrombectomy with stent placement on the left.  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 coli UTI, MSSA pneumonia Type 2 DM  Plan:  -  Continue PSV - mental status and tracheal stenosis preclude extubation.  - Plan tracheostomy and PEG bedside 10 am 1/16 - Anticipate slow recovery.  -  Unable to tolerate guideline directed heart failure therapy due to hypotension requiring midodrine. -Diuresis holiday 1/14 - tachy, Cr bump -Complete 7 days of antibiotics for E. coli UTI and MSSA pneumonia -Continue IV heparin for LV thrombus -Continue stroke rehabilitation as tolerated -Continue secondary stroke prevention with statin and Brilinta. -Fever 1/14 - blood cultures, CXR, trach aspirate, plan to  start empiric GN coverage, de-escalate as able  Best Practice (right click and "Reselect all SmartList Selections" daily)   Diet/type: tubefeeds DVT prophylaxis: systemic heparin GI prophylaxis: PPI Lines: N/A Foley:  Yes, and it is still needed Code Status:  full code Last date of multidisciplinary goals of care discussion [1/6 updated brandon son over phone ]  Critical care time:   CRITICAL CARE Performed by: Karren Burly   Total critical care time: 33 minutes  Critical care time was exclusive of separately billable procedures and treating other patients.  Critical care was necessary to treat or prevent imminent or life-threatening deterioration.  Critical care was time spent personally by me on the following activities: development of treatment plan with patient and/or surrogate as well as nursing, discussions with consultants, evaluation of patient's response to treatment, examination of patient, obtaining history from patient or surrogate, ordering and performing treatments and interventions, ordering and review of laboratory studies, ordering and review of radiographic studies, pulse oximetry and re-evaluation of patient's condition.   Karren Burly, MD See Amion for contact info  03/02/2021, 9:47 AM

## 2021-03-02 NOTE — Progress Notes (Signed)
STROKE TEAM PROGRESS NOTE   INTERVAL HISTORY:  Patient is seen in her room with daughter at the bedside. Plan for tracheostomy on Monday.  She has been hemodynamically stable and her neurological exam remains stable.   Drowsy today and barely opens eyes with stimulation.  Rarely following simple commands with fingers and toes, Heparin IV running .Trauma team consulted for PEG tube placement after tracheostomy.   Vitals:   03/02/21 1000 03/02/21 1100 03/02/21 1108 03/02/21 1200  BP: 118/85 114/83 114/83 98/77  Pulse: (!) 114 (!) 115 (!) 117 (!) 111  Resp: (!) 31 (!) 29 (!) 24 16  Temp:    (!) 100.7 F (38.2 C)  TempSrc:    Oral  SpO2: 97% 97% 98% 97%  Weight:      Height:       CBC:  Recent Labs  Lab 03/01/21 0040 03/02/21 0638  WBC 13.5* 16.1*  HGB 13.3 14.7  HCT 44.2 48.7*  MCV 95.1 95.5  PLT 280 [id number]   Basic Metabolic Panel:  Recent Labs  Lab 02/27/21 0333 03/01/21 0040 03/01/21 1458 03/02/21 0638  NA 140   < > 147* 145  K 4.1   < > 3.6 4.5  CL 99   < > 101 106  CO2 31   < > 33* 26  GLUCOSE 181*   < > 152* 173*  BUN 30*   < > 33* 41*  CREATININE 0.78   < > 0.82 0.98  CALCIUM 8.5*   < > 9.1 8.4*  MG 2.6*  --   --   --   PHOS 2.8  --   --   --    < > = values in this interval not displayed.   Lipid Panel:  No results for input(s): CHOL, TRIG, HDL, CHOLHDL, VLDL, LDLCALC in the last 168 hours.  HgbA1c:  No results for input(s): HGBA1C in the last 168 hours.  Urine Drug Screen: No results for input(s): LABOPIA, COCAINSCRNUR, LABBENZ, AMPHETMU, THCU, LABBARB in the last 168 hours.  Alcohol Level  No results for input(s): ETH in the last 168 hours.   IMAGING past 24 hours DG CHEST PORT 1 VIEW  Result Date: 03/02/2021 CLINICAL DATA:  Intubated patient. Follow-up exam. Fever/pneumonia. EXAM: PORTABLE CHEST 1 VIEW COMPARISON:  02/24/2021 and older studies. FINDINGS: Left lung base, retrocardiac, opacity appears improved. Remainder of the lungs is clear. No  convincing pleural effusion.  No pneumothorax. Cardiac silhouette normal in size. Endotracheal tube stable, tip 2.8 cm above the carina. Enteric feeding tube passes below the diaphragm. IMPRESSION: 1. Mild interval improvement in opacity at the left lung base consistent with improved infection or atelectasis. Remainder of the lungs remains clear. 2. Stable well-positioned support apparatus. Electronically Signed   By: Lajean Manes M.D.   On: 03/02/2021 10:30    PHYSICAL EXAM  Temp:  [99.2 F (37.3 C)-102.3 F (39.1 C)] 100.7 F (38.2 C) (01/14 1200) Pulse Rate:  [99-120] 111 (01/14 1200) Resp:  [16-34] 16 (01/14 1200) BP: (84-125)/(64-92) 98/77 (01/14 1200) SpO2:  [96 %-99 %] 97 % (01/14 1200) FiO2 (%):  [30 %] 30 % (01/14 1108)  General - [name], intubated off sedation.  Cardiovascular - Regular rhythm but tachycardia.  Neuro - intubated off sedation, arousable but very lethargic, follows occasional commands but less active.   Right gaze preference barely cross midline.  PERRL, does not blink to threat on left side. Sensation, coordination and gait not tested.   ASSESSMENT/PLAN Ms.  Taylor [name] is a 56 y.o. female with history of CHF, T2DM and OSA presenting with acute onset left-sided weakness, right gaze deviation and slurred speech. She was found to have occlusion of the distal basilar artery, right P1 and proximal right P2.  She was given TNK and received mechanical thrombectomy to the right and left PCA with stenting on the left. She was found to have two apical thrombi on her 2D echocardiogram and remains on IV heparin. Extubated and then emergently reintubated on 02/20/2021.  She weaned on pressure support 5/5 with an FiO2 30% on 1/8, however unable to wean in the morning on 1/9.  CCM and steroid and diuretic course completed 02/23/2021 for swollen airway. WBC 18.0, respiratory, blood, and urine cultures sent. Vanco and cefepime started 02/24/2021. Per family  conversation, plan for tracheostomy on Monday.   Stroke:  embolic shower with b/l PCA occlusion s/p IR with TICI3 and left PCA stenting, likely secondary due to embolism from low EF and apical thrombi Code Stroke CT head No acute abnormality.  ASPECTS 10.    CTA head & neck nonopacification of distal basilar artery, right P1 and P2, poor opacification of right vertebral artery, focal stenosis in right A1 and A3 IR with b/l P1 occlusion s/p thrombectomy with TICI3 and left PCA stent MRI  Acute right PCA territory infarct, in right thalamus and occipital lobe, small acute infarcts in bilateral cerebellar hemispheres with scattered small acute infarcts in bilateral parietal lobes, left occipital and left frontal lobes, area of susceptibility infarct in right occipital lobe suggestive of petechial hemorrhage MRA thrombectomy and placement of left P1/P2 stent with good flow related signal in distal left PCA, poor flow signal in distal basilar tip, poor opacification of right vertebral artery, right A1 and A3 ACA stenosis 2D Echo global hypokinesis with inferior akinesis, EF 0000000, grade 3 diastolic dysfunction, thrombi in inferoapical and anteroapical areas, no atrial level shunt LDL 151 HgbA1c 11.9 VTE prophylaxis -Heparin IV aspirin 81 mg daily prior to admission, Now on heparin IV and Brilinta.  Transition to PO AC once extubated with p.o. access. 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% Two apical thrombi present Now on heparin IV and Brilinta  Respiratory failure Currently intubated and ventilated Management per CCM Reintubated 1/4 for not tolerating extubation Still no cuff leak today- 1/8 Vocal cord edema- steroids completed Continue diuresis today Plan for trach on Monday   Hx of hypertension Hypotension Home meds:  lisinopril 2.5 mg daily Stable on the low end SBP 90-100s Off Levophed On midodrine 10  mg every 8h Long-term BP goal normotensive  Fever Leukocytosis UTI WBC >15.0 -> 16.1-> 18.0-> 15.2-> 15.4-> 13.5 T-max 100.6->99.1 ax. UA WBC > 50 Urine culture Gram neg rods. 02/17/21 E Coli Sensitive to all antibiotics tested On cefepime -completed course Management per CCM on vanco and cefepime 02/24/2021 -> ancef  Hyperlipidemia Home meds:  atorvastatin 40 mg daily, resumed in hospital LDL 151, goal < 70 Increase to 80 mg atorvastatin daily  Continue statin at discharge  Diabetes type II Uncontrolled Home meds:  Jardiance 25 mg daily, levemir 17 units BID, metformin 1000 mg BID HgbA1c 11.9, goal < 7.0 CBGs Diabetes coordinator consult SSI- 0-20u q4  Other Stroke Risk Factors Former cigarette smoker Obstructive sleep apnea  Other Active Problems Hyperkalemia - 5.3 - > 5.0-> 4.2 (resolved) Dysphagia on tube feeding @ [facility] day # 18  Patient seen  and examined by NP/APP with MD. MD to update note as needed.   Patient unfortunately is not doing well from a respiratory standpoint and continuing fever and seems prolonged ventilatory support for respiratory failure.  Tracheostomy and PEG tube next week.  She will likely need prolonged rehabilitation.  Continue IV heparin.  Ongoing medical care as per CCM.  Long discussion with patient's daughter at bedside and answered questions. This patient is critically ill and at significant risk of neurological worsening, death and care requires constant monitoring of vital signs, hemodynamics,respiratory and cardiac monitoring, extensive review of multiple databases, frequent neurological assessment, discussion with family, other specialists and medical decision making of high complexity.I have made any additions or clarifications directly to the above note.This critical care time does not reflect procedure time, or teaching time or supervisory time of PA/NP/Med Resident etc but could involve care discussion time.  I spent 32 minutes of  neurocritical care time  in the care of  this patient.    Antony Contras, MD    To contact Stroke Continuity provider, please refer to [email or web address] After hours, contact General Neurology

## 2021-03-03 DIAGNOSIS — I639 Cerebral infarction, unspecified: Secondary | ICD-10-CM | POA: Diagnosis not present

## 2021-03-03 LAB — BASIC METABOLIC PANEL
Anion gap: 7 (ref 5–15)
BUN: 38 mg/dL — ABNORMAL HIGH (ref 6–20)
CO2: 31 mmol/L (ref 22–32)
Calcium: 7.8 mg/dL — ABNORMAL LOW (ref 8.9–10.3)
Chloride: 109 mmol/L (ref 98–111)
Creatinine, Ser: 0.94 mg/dL (ref 0.44–1.00)
GFR, Estimated: >60 mL/min (ref >60)
Glucose, Bld: 200 mg/dL — ABNORMAL HIGH (ref 70–99)
Potassium: 3.6 mmol/L (ref 3.5–5.1)
Sodium: 147 mmol/L — ABNORMAL HIGH (ref 135–145)

## 2021-03-03 LAB — CBC
HCT: 47 % — ABNORMAL HIGH (ref 36.0–46.0)
Hemoglobin: 14.1 g/dL (ref 12.0–15.0)
MCH: 28.7 pg (ref 26.0–34.0)
MCHC: 30 g/dL (ref 30.0–36.0)
MCV: 95.7 fL (ref 80.0–100.0)
Platelets: 243 10*3/uL (ref 150–400)
RBC: 4.91 MIL/uL (ref 3.87–5.11)
RDW: 18 % — ABNORMAL HIGH (ref 11.5–15.5)
WBC: 14.3 10*3/uL — ABNORMAL HIGH (ref 4.0–10.5)
nRBC: 0 % (ref 0.0–0.2)

## 2021-03-03 LAB — HEPARIN LEVEL (UNFRACTIONATED)
Heparin Unfractionated: 0.57 IU/mL (ref 0.30–0.70)
Heparin Unfractionated: 0.55 IU/mL (ref 0.30–0.70)

## 2021-03-03 LAB — GLUCOSE, CAPILLARY
Glucose-Capillary: 159 mg/dL — ABNORMAL HIGH (ref 70–99)
Glucose-Capillary: 121 mg/dL — ABNORMAL HIGH (ref 70–99)
Glucose-Capillary: 238 mg/dL — ABNORMAL HIGH (ref 70–99)
Glucose-Capillary: 229 mg/dL — ABNORMAL HIGH (ref 70–99)
Glucose-Capillary: 173 mg/dL — ABNORMAL HIGH (ref 70–99)
Glucose-Capillary: 168 mg/dL — ABNORMAL HIGH (ref 70–99)

## 2021-03-03 MED ORDER — CHLORHEXIDINE GLUCONATE CLOTH 2 % EX PADS
6.0000 | MEDICATED_PAD | Freq: Every day | CUTANEOUS | Status: DC
  Administered 2021-03-04 – 2021-03-09 (×7): 6 via TOPICAL

## 2021-03-03 MED ORDER — NUTRISOURCE FIBER PO PACK
1.0000 | PACK | Freq: Three times a day (TID) | ORAL | Status: DC
  Administered 2021-03-03 – 2021-03-09 (×18): 1
  Filled 2021-03-03 (×20): qty 1

## 2021-03-03 MED ORDER — SODIUM CHLORIDE 0.9 % IV SOLN: 250.0000 mL | INTRAVENOUS | Status: DC

## 2021-03-03 MED ORDER — ALBUMIN HUMAN 25 % IV SOLN
12.5000 g | Freq: Once | INTRAVENOUS | Status: AC
  Administered 2021-03-03: 12.5 g via INTRAVENOUS
  Filled 2021-03-03: qty 50

## 2021-03-03 MED ORDER — FREE WATER
200.0000 mL | Freq: Three times a day (TID) | Status: DC
  Administered 2021-03-03 – 2021-03-06 (×9): 200 mL

## 2021-03-03 MED ORDER — PHENYLEPHRINE HCL-NACL 20-0.9 MG/250ML-% IV SOLN
25.0000 ug/min | INTRAVENOUS | Status: DC
  Filled 2021-03-03: qty 250

## 2021-03-03 NOTE — Progress Notes (Addendum)
ANTICOAGULATION CONSULT NOTE  Pharmacy Consult for Heparin Indication:  LV thrombus, s/p CVA   No Known Allergies  Patient Measurements: Height: 5\' 1"  (154.9 cm) Weight: 60 kg (132 lb 4.4 oz) IBW/kg (Calculated) : 47.8 Heparin Dosing Weight: 60kg  Vital Signs: Temp: 103.4 F (39.7 C) (01/15 1149) Temp Source: Axillary (01/15 1149) BP: 102/74 (01/15 1200) Pulse Rate: 117 (01/15 1200)  Labs: Recent Labs    03/01/21 0040 03/01/21 1458 03/02/21 [id number] 03/02/21 0733 03/02/21 1552 03/03/21 0224 03/03/21 1151  HGB 13.3  --  14.7  --   --  14.1  --   HCT 44.2  --  48.7*  --   --  47.0*  --   PLT 280  --  256  --   --  243  --   HEPARINUNFRC 0.46  --   --    < > 0.74* 0.57 0.55  CREATININE 0.81 0.82 0.98  --   --  0.94  --    < > = values in this interval not displayed.    Estimated Creatinine Clearance: 54.9 mL/min (by C-G formula based on SCr of 0.94 mg/dL).   Assessment: 77 YOF continuing heparin for new LV thrombus in setting of acute CVA. S/p mechanical thrombectomy and TNK.   Heparin levels with lots of fluctuation between low therapeutic and supra-therapeutic levels. Currently slightly supra-therapeutic at 0.55 units/mL on infusion at 600 units/hr. CBC WNL.  RN reports bleeding from foley/vagina that could be from foley insertions; MD aware.  Goal of Therapy:  Heparin level 0.3-0.5 units/ml Monitor platelets by anticoagulation protocol: Yes   Plan:  Reduce heparin gtt slightly to 550 units/hr - off at midnight for procedure in AM Daily CBC Monitor for bleeding resolution. F/U with transitioning to [place] post procedure   Mac Dowdell D. Mina Marble, PharmD, BCPS, [place] 03/03/2021, 1:11 PM

## 2021-03-03 NOTE — Progress Notes (Addendum)
STROKE TEAM PROGRESS NOTE   INTERVAL HISTORY:  Patient spiked temperature 103 yesterday.  Her antibiotics have been changed to cefepime and she is less febrile today.  Her neurological exam remains stable.  She is arousable today but barely opens eyes with stimulation.  Following simple commands with f nodding her head.  Heparin IV running, ordered to stop at midnight in anticipation for procedures tomorrow. Febrile and tachycardic.   She is having vaginal bleeding.  Hematocrit is stable  Vitals:   03/03/21 0700 03/03/21 0735 03/03/21 0800 03/03/21 0900  BP: 99/67 99/67 100/68 108/72  Pulse: (!) 104 (!) 105 (!) 101 (!) 114  Resp: (!) 22 (!) 21 18 (!) 22  Temp:   (!) 100.9 F (38.3 C)   TempSrc:   Axillary   SpO2: 97% 98% 98% 98%  Weight:      Height:       CBC:  Recent Labs  Lab 03/02/21 0638 03/03/21 0224  WBC 16.1* 14.3*  HGB 14.7 14.1  HCT 48.7* 47.0*  MCV 95.5 95.7  PLT 256 0000000    Basic Metabolic Panel:  Recent Labs  Lab 02/27/21 0333 03/01/21 0040 03/02/21 0638 03/03/21 0224  NA 140   < > 145 147*  K 4.1   < > 4.5 3.6  CL 99   < > 106 109  CO2 31   < > 26 31  GLUCOSE 181*   < > 173* 200*  BUN 30*   < > 41* 38*  CREATININE 0.78   < > 0.98 0.94  CALCIUM 8.5*   < > 8.4* 7.8*  MG 2.6*  --   --   --   PHOS 2.8  --   --   --    < > = values in this interval not displayed.    Lipid Panel:  No results for input(s): CHOL, TRIG, HDL, CHOLHDL, VLDL, LDLCALC in the last 168 hours.  HgbA1c:  No results for input(s): HGBA1C in the last 168 hours.  Urine Drug Screen: No results for input(s): LABOPIA, COCAINSCRNUR, LABBENZ, AMPHETMU, THCU, LABBARB in the last 168 hours.  Alcohol Level  No results for input(s): ETH in the last 168 hours.   IMAGING past 24 hours DG CHEST PORT 1 VIEW  Result Date: 03/02/2021 CLINICAL DATA:  Intubated patient. Follow-up exam. Fever/pneumonia. EXAM: PORTABLE CHEST 1 VIEW COMPARISON:  02/24/2021 and older studies. FINDINGS: Left lung  base, retrocardiac, opacity appears improved. Remainder of the lungs is clear. No convincing pleural effusion.  No pneumothorax. Cardiac silhouette normal in size. Endotracheal tube stable, tip 2.8 cm above the carina. Enteric feeding tube passes below the diaphragm. IMPRESSION: 1. Mild interval improvement in opacity at the left lung base consistent with improved infection or atelectasis. Remainder of the lungs remains clear. 2. Stable well-positioned support apparatus. Electronically Signed   By: Lajean Manes M.D.   On: 03/02/2021 10:30   DG Abd Portable 1V  Result Date: 03/02/2021 CLINICAL DATA:  Confirm orogastric tube placement. EXAM: PORTABLE ABDOMEN - 1 VIEW COMPARISON:  02/15/2021. FINDINGS: Enteric tube has been replaced with nasal/orogastric tube. Tip projects in the right mid abdomen consistent with positioning in the distal stomach. Normal bowel gas pattern. IMPRESSION: Well-positioned nasal/orogastric tube. Electronically Signed   By: Lajean Manes M.D.   On: 03/02/2021 12:31    PHYSICAL EXAM  Temp:  [97.6 F (36.4 C)-103 F (39.4 C)] 100.9 F (38.3 C) (01/15 0800) Pulse Rate:  [90-135] 114 (01/15 0900) Resp:  [16-41]  22 (01/15 0900) BP: (98-134)/(62-101) 108/72 (01/15 0900) SpO2:  [97 %-100 %] 98 % (01/15 0900) FiO2 (%):  [30 %] 30 % (01/15 0735)  General - [name], intubated off sedation.  Cardiovascular - Regular rhythm but tachycardia.  Neuro - intubated off sedation, arousable but very lethargic, follows occasional commands but less active.   Right gaze preference barely cross midline.  PERRL, does not blink to threat on left side. Sensation, coordination and gait not tested.   ASSESSMENT/PLAN Ms. ZARIANNA FROEHLICH is a 56 y.o. female with history of CHF, T2DM and OSA presenting with acute onset left-sided weakness, right gaze deviation and slurred speech. She was found to have occlusion of the distal basilar artery, right P1 and proximal right P2.   She was given TNK and received mechanical thrombectomy to the right and left PCA with stenting on the left. She was found to have two apical thrombi on her 2D echocardiogram and remains on IV heparin. Extubated and then emergently reintubated on 02/20/2021.  She weaned on pressure support 5/5 with an FiO2 30% on 1/8, however unable to wean in the morning on 1/9.  CCM and steroid and diuretic course completed 02/23/2021 for swollen airway. WBC 18.0, respiratory, blood, and urine cultures sent. Vanco and cefepime started 02/24/2021. Per family conversation, plan for tracheostomy and PEG on Monday.   Stroke:  embolic shower with b/l PCA occlusion s/p IR with TICI3 and left PCA stenting, likely secondary due to embolism from low EF and apical thrombi Code Stroke CT head No acute abnormality.  ASPECTS 10.    CTA head & neck nonopacification of distal basilar artery, right P1 and P2, poor opacification of right vertebral artery, focal stenosis in right A1 and A3 IR with b/l P1 occlusion s/p thrombectomy with TICI3 and left PCA stent MRI  Acute right PCA territory infarct, in right thalamus and occipital lobe, small acute infarcts in bilateral cerebellar hemispheres with scattered small acute infarcts in bilateral parietal lobes, left occipital and left frontal lobes, area of susceptibility infarct in right occipital lobe suggestive of petechial hemorrhage MRA thrombectomy and placement of left P1/P2 stent with good flow related signal in distal left PCA, poor flow signal in distal basilar tip, poor opacification of right vertebral artery, right A1 and A3 ACA stenosis 2D Echo global hypokinesis with inferior akinesis, EF 0000000, grade 3 diastolic dysfunction, thrombi in inferoapical and anteroapical areas, no atrial level shunt LDL 151 HgbA1c 11.9 VTE prophylaxis -Heparin IV aspirin 81 mg daily prior to admission, Now on heparin IV and Brilinta.  Transition to PO AC once extubated with p.o. access. Therapy  recommendations:  CIR Disposition:  pending  Congestive heart failure Cardiomyopathy  LV thrombus EF 20-25% Two apical thrombi present Now on heparin IV and Brilinta  Respiratory failure Currently intubated and ventilated Management per CCM Reintubated 1/4 for not tolerating extubation Still no cuff leak today- 1/8 Vocal cord edema- steroids completed Continue diuresis today Plan for trach on Monday   Hx of hypertension Hypotension Home meds:  lisinopril 2.5 mg daily Stable on the low end SBP 90-100s Off Levophed On midodrine 10 mg every 8h Long-term BP goal normotensive  Fever Leukocytosis UTI WBC >15.0 -> 16.1-> 18.0-> 15.2-> 15.4-> 13.5-> 16.1-> 14.3 T-max 100.6->99.1 ax. UA WBC > 50 Urine culture Gram neg rods. 02/17/21 E Coli Sensitive to all antibiotics tested On cefepime -completed course Management per CCM on vanco and cefepime 02/24/2021 -> ancef  Hyperlipidemia Home meds:  atorvastatin 40  mg daily, resumed in hospital LDL 151, goal < 70 Increase to 80 mg atorvastatin daily  Continue statin at discharge  Diabetes type II Uncontrolled Home meds:  Jardiance 25 mg daily, levemir 17 units BID, metformin 1000 mg BID HgbA1c 11.9, goal < 7.0 CBGs Diabetes coordinator consult SSI- 0-20u q4  Other Stroke Risk Factors Former cigarette smoker Obstructive sleep apnea  Other Active Problems Hyperkalemia - 5.3 - > 5.0-> 4.2 (resolved) Dysphagia on tube feeding @ [facility] day # 19  Patient seen and examined by NP/APP with MD. MD to update note as needed.   Janine Ores, DNP, FNP-BC [facility] Pager: [phone]  STROKE MD NOTE :  I have personally obtained history,examined this patient, reviewed notes, independently viewed imaging studies, participated in medical decision making and plan of care.ROS completed by me personally and pertinent positives fully documented  I have made any additions or clarifications directly to the above note.  Agree with note above.  Patient is neurological exam remains essentially unchanged but she is medically too sick to be extubated and needs tracheostomy and peg tube hopefully in the next couple of days his medical condition stabilizes.  Discussed with Dr. Silas Flood critical care medicine.This patient is critically ill and at significant risk of neurological worsening, death and care requires constant monitoring of vital signs, hemodynamics,respiratory and cardiac monitoring, extensive review of multiple databases, frequent neurological assessment, discussion with family, other specialists and medical decision making of high complexity.I have made any additions or clarifications directly to the above note.This critical care time does not reflect procedure time, or teaching time or supervisory time of PA/NP/Med Resident etc but could involve care discussion time.  I spent 30 minutes of neurocritical care time  in the care of  this patient.      Antony Contras, MD Medical Director [facility] Pager: [phone] 03/03/2021 1:23 PM    To contact Stroke Continuity provider, please refer to [email or web address] After hours, contact General Neurology

## 2021-03-03 NOTE — Progress Notes (Signed)
NAME:  Taylor [name], MRN:  [id number], DOB:  [birth year], LOS: 52 ADMISSION DATE:  01/29/2021, CONSULTATION DATE:  12/27 REFERRING MD:  Dr Tennis Must Sindy Messing , CHIEF COMPLAINT:   CVA  History of Present Illness:  56 year old female with PMH as below, which is significant for HFrEF with LVEF 20-25 % (2017), DM, and OSA on CPAP. 12/27 she presented to [facility] ED with complaints of acute onset left sided weakness and slurred speech. EMS noted non-verbal and right gaze preference. Imaging consistent with occlusion of the distal basilar arterty, right P1, and proximal R P2. She was given systemic TNK and mechanical thrombectomy  with stent retriever on the right with complete recanalization after one pass (TICI3). PCCM consulted for vent management and ICU care.    Pertinent  Medical History   has a past medical history of CHF (congestive heart failure) ([place]), Diabetes mellitus without complication ([place]), and Obstructive sleep apnea.   Significant Hospital Events: Including procedures, antibiotic start and stop dates in addition to other pertinent events   12/27 admit for posterior circulation stroke. TNK given and IR throbmectomy 12/28 echo with LV thrombus, EF 20-25%.  Started on heparin 12/30 failing weaning trials.  Started on cefepime for positive UA 1/1 On PSV weans 1/4: patient extubated and developed stridor and increase wob; required reintubation; decadron and lasix given 1/6 restarted Decadron since there is no cuff leak.  Lasix dose increased 1/9 Still waiting on family to decide on tracheostomy  1/13 Diuresing, urinary retention foley placed 1/14 diuresis held, febrile, tachycardic, placed on cefepime  Interim History / Subjective:   Febrile still. Cefepime continues, UA clear, CXR clear.  Objective   Blood pressure 109/90, pulse (!) 111, temperature (!) 102.1 F (38.9 C), temperature source Axillary, resp. rate 16, height 5\' 1"  (1.549 m), weight 60 kg, SpO2 97 %.    Vent  Mode: PRVC FiO2 (%):  [30 %] 30 % Set Rate:  [18 bmp] 18 bmp Vt Set:  [380 mL] 380 mL PEEP:  [5 cmH20] 5 cmH20 Plateau Pressure:  [15 cmH20-19 cmH20] 18 cmH20   Intake/Output Summary (Last 24 hours) at 03/03/2021 1613 Last data filed at 03/03/2021 1600 Gross per 24 hour  Intake 2299.61 ml  Output 2425 ml  Net -125.39 ml    Filed Weights   02/25/21 0500 02/26/21 0500 02/28/21 0500  Weight: 60.2 kg 60.2 kg 60 kg    Examination: Gen:      No acute distress, frail, appears malnourished.  HEENT:  EOMI, sclera anicteric Neck:     No masses; no thyromegaly ET tube Lungs:    Clear to auscultation bilaterally; normal respiratory effort CV:         Regular rate and rhythm; no murmurs Abd:      + bowel sounds; soft, non-tender; no palpable masses, no distension Ext:    No edema; adequate peripheral perfusion Skin:      Warm and dry; no rash Neuro:   Somnolent, not following commands for me    Assessment & Plan:   CVA: basilar, bilateral PI/PCA. TNK given in ED and she is s/p mechanical thrombectomy on the right and mechanical thrombectomy with stent placement on the left.  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 coli UTI, MSSA pneumonia Type 2 DM Fever  Plan:  - PRVC, did not tolerated PSV 1/15 - Plan tracheostomy and PEG bedside 10 am 1/16 - Anticipate slow recovery.  -  Unable to tolerate guideline directed heart failure therapy due to hypotension requiring midodrine. -Continue Diuresis holiday 1/14 - tachy, Cr bump -Completed 7 days of antibiotics for E. coli UTI and MSSA pneumonia -Continue IV heparin for LV thrombus -Continue stroke rehabilitation as tolerated -Continue secondary stroke prevention with statin and Brilinta. -Fever 1/14 - blood cultures, CXR clear, Ua clear, trach  aspirate pending, on therapeutic AC so DVT unlikely, cefepime added, suspect central fever  Best Practice (right click and "Reselect all SmartList Selections" daily)   Diet/type: tubefeeds DVT prophylaxis: systemic heparin GI prophylaxis: PPI Lines: N/A Foley:  Yes, and it is still needed Code Status:  full code Last date of multidisciplinary goals of care discussion [1/6 updated brandon son over phone ]  Critical care time:   CRITICAL CARE Performed by: Lanier Clam   Total critical care time: 31 minutes  Critical care time was exclusive of separately billable procedures and treating other patients.  Critical care was necessary to treat or prevent imminent or life-threatening deterioration.  Critical care was time spent personally by me on the following activities: development of treatment plan with patient and/or surrogate as well as nursing, discussions with consultants, evaluation of patient's response to treatment, examination of patient, obtaining history from patient or surrogate, ordering and performing treatments and interventions, ordering and review of laboratory studies, ordering and review of radiographic studies, pulse oximetry and re-evaluation of patient's condition.   Lanier Clam, MD See Amion for contact info  03/03/2021, 4:13 PM

## 2021-03-03 NOTE — Progress Notes (Signed)
Discussed with physicians and pharmacists about the indication for the foley catheter. This RN is in agreement with physicians that this foley is medically necessary for this patient at this time. The foley has been removed on multiple occasions and the patient has been unable to excrete urine on her own - resulting in multiple in/out catheters and ultimately foley replacements. Significant urethral trauma has occurred as a result. The current foley was placed on 1/13 and the patient is on urinary retention medications at this time. The foley will remain in at this time.  Beryl Meager, RN

## 2021-03-03 NOTE — Progress Notes (Signed)
The patient's tachycardia and increased temperatures were discussed with the CCM. The patient will continue to be treated with PRN tylenol and ice packs.   Beryl Meager, RN

## 2021-03-03 NOTE — Progress Notes (Signed)
An USGPIV (ultrasound guided PIV) has been placed for short-term vasopressor infusion. A correctly placed ivWatch must be used when administering Vasopressors. Should this treatment be needed beyond 72 hours, central line access should be obtained.  It will be the responsibility of the bedside nurse to follow best practice to prevent extravasations.   ?

## 2021-03-03 NOTE — Progress Notes (Signed)
Consent for bedside tracheostomy and PEG signed by pt's daughter, Edmund Hilda, and placed in pt's chart.

## 2021-03-03 NOTE — Progress Notes (Signed)
ANTICOAGULATION CONSULT NOTE  Pharmacy Consult for Heparin Indication:  LV thrombus, s/p CVA   No Known Allergies  Patient Measurements: Height: 5\' 1"  (154.9 cm) Weight: 60 kg (132 lb 4.4 oz) IBW/kg (Calculated) : 47.8 Heparin Dosing Weight: 60kg  Vital Signs: Temp: 97.8 F (36.6 C) (01/15 0323) Temp Source: Axillary (01/15 0323) BP: 104/62 (01/15 0300) Pulse Rate: 90 (01/15 0300)  Labs: Recent Labs    03/01/21 0040 03/01/21 1458 03/02/21 02/19/2021 03/02/21 0733 03/02/21 1552 03/03/21 0224  HGB 13.3  --  14.7  --   --  14.1  HCT 44.2  --  48.7*  --   --  47.0*  PLT 280  --  256  --   --  243  HEPARINUNFRC 0.46  --   --  0.67 0.74* 0.57  CREATININE 0.81 0.82 0.98  --   --  0.94    Estimated Creatinine Clearance: 54.9 mL/min (by C-G formula based on SCr of 0.94 mg/dL).   Assessment: 55 YOF continuing heparin for new LV thrombus in setting of acute CVA. S/p mechanical thrombectomy and TNK.   Heparin levels with lots of fluctuation between low therapeutic and supra-therapeutic levels. Repeat level this afternoon is supratherapeutic at 0.74.  1/15 AM update:  Heparin level just above goal  Goal of Therapy:  Heparin level 0.3-0.5 units/ml Monitor platelets by anticoagulation protocol: Yes   Plan:  Dec heparin to 600 units/hr 1200 heparin level Heparin off at midnight tonight for procedure 1/16  2/16, PharmD, BCPS Clinical Pharmacist Phone: [phone]

## 2021-03-03 NOTE — Progress Notes (Signed)
eLink Physician-Brief Progress Note Patient Name: Taylor [name] DOB: [birth year] MRN: [id number]   Date of Service  03/03/2021  HPI/Events of Note  Hypotension - SBP = 70's. No CVL only has PIV. LVEF estimated to by 18%. Midrodrine due at midnight.   eICU Interventions  Plan: Phenylephrine IV infusion via PIV. Titrate to MAP >= 65 and SBP >= 90. 25% Albumin 12.5 gm IV now.      Intervention Category Major Interventions: Hypotension - evaluation and management  Lenell Antu 03/03/2021, 8:56 PM

## 2021-03-03 NOTE — Progress Notes (Addendum)
Contacted Elink for pt's hypotension.  SBP in the 70's with a MAP below 65.  CCM MD ordered 25% Albumin 12.5 gm IV now and Phenylephrine IV infusion.  Pt's BP has improved since orders.  Will hang Albumin now and start Phenylephrine gtt if pt becomes hypotensive again.    Also, notified Elink pt bleeding from mouth.  Heparin gtt will be turned off at midnight.  Will continue to monitor and do mouth care.

## 2021-03-04 ENCOUNTER — Inpatient Hospital Stay (HOSPITAL_COMMUNITY): Payer: 59

## 2021-03-04 ENCOUNTER — Encounter (HOSPITAL_COMMUNITY): Admission: EM | Disposition: E | Payer: Self-pay | Source: Home / Self Care | Attending: Neurology

## 2021-03-04 DIAGNOSIS — I639 Cerebral infarction, unspecified: Secondary | ICD-10-CM | POA: Diagnosis not present

## 2021-03-04 DIAGNOSIS — J9600 Acute respiratory failure, unspecified whether with hypoxia or hypercapnia: Secondary | ICD-10-CM

## 2021-03-04 HISTORY — PX: ESOPHAGOGASTRODUODENOSCOPY: SHX5428

## 2021-03-04 HISTORY — PX: PEG PLACEMENT: SHX5437

## 2021-03-04 LAB — GLUCOSE, CAPILLARY
Glucose-Capillary: 115 mg/dL — ABNORMAL HIGH (ref 70–99)
Glucose-Capillary: 113 mg/dL — ABNORMAL HIGH (ref 70–99)
Glucose-Capillary: 159 mg/dL — ABNORMAL HIGH (ref 70–99)
Glucose-Capillary: 144 mg/dL — ABNORMAL HIGH (ref 70–99)
Glucose-Capillary: 146 mg/dL — ABNORMAL HIGH (ref 70–99)
Glucose-Capillary: 134 mg/dL — ABNORMAL HIGH (ref 70–99)

## 2021-03-04 LAB — BASIC METABOLIC PANEL
Anion gap: 12 (ref 5–15)
BUN: 44 mg/dL — ABNORMAL HIGH (ref 6–20)
CO2: 28 mmol/L (ref 22–32)
Calcium: 8.5 mg/dL — ABNORMAL LOW (ref 8.9–10.3)
Chloride: 108 mmol/L (ref 98–111)
Creatinine, Ser: 1.05 mg/dL — ABNORMAL HIGH (ref 0.44–1.00)
GFR, Estimated: >60 mL/min (ref >60)
Glucose, Bld: 120 mg/dL — ABNORMAL HIGH (ref 70–99)
Potassium: 3.8 mmol/L (ref 3.5–5.1)
Sodium: 148 mmol/L — ABNORMAL HIGH (ref 135–145)

## 2021-03-04 LAB — CBC
HCT: 44 % (ref 36.0–46.0)
Hemoglobin: 12.9 g/dL (ref 12.0–15.0)
MCH: 28.3 pg (ref 26.0–34.0)
MCHC: 29.3 g/dL — ABNORMAL LOW (ref 30.0–36.0)
MCV: 96.5 fL (ref 80.0–100.0)
Platelets: 204 10*3/uL (ref 150–400)
RBC: 4.56 MIL/uL (ref 3.87–5.11)
RDW: 18 % — ABNORMAL HIGH (ref 11.5–15.5)
WBC: 13.3 10*3/uL — ABNORMAL HIGH (ref 4.0–10.5)
nRBC: 0 % (ref 0.0–0.2)

## 2021-03-04 LAB — PROCALCITONIN
Procalcitonin: 0.67 ng/mL
Procalcitonin: 0.81 ng/mL

## 2021-03-04 SURGERY — EGD (ESOPHAGOGASTRODUODENOSCOPY)
Anesthesia: Moderate Sedation

## 2021-03-04 MED ORDER — HEPARIN (PORCINE) 25000 UT/250ML-% IV SOLN
650.0000 [IU]/h | INTRAVENOUS | Status: AC
  Administered 2021-03-04: 19:00:00 550 [IU]/h via INTRAVENOUS
  Administered 2021-03-06: 650 [IU]/h via INTRAVENOUS
  Filled 2021-03-04: qty 250

## 2021-03-04 MED ORDER — VECURONIUM BROMIDE 10 MG IV SOLR
10.0000 mg | Freq: Once | INTRAVENOUS | Status: AC
  Administered 2021-03-04: 10 mg via INTRAVENOUS
  Filled 2021-03-04: qty 10

## 2021-03-04 MED ORDER — MIDAZOLAM HCL 2 MG/2ML IJ SOLN
2.0000 mg | Freq: Once | INTRAMUSCULAR | Status: AC
  Administered 2021-03-04: 2 mg via INTRAVENOUS
  Filled 2021-03-04: qty 2

## 2021-03-04 MED ORDER — LACTATED RINGERS IV BOLUS
500.0000 mL | Freq: Once | INTRAVENOUS | Status: AC
  Administered 2021-03-04: 500 mL via INTRAVENOUS

## 2021-03-04 MED ORDER — FENTANYL CITRATE PF 50 MCG/ML IJ SOSY
100.0000 ug | PREFILLED_SYRINGE | Freq: Once | INTRAMUSCULAR | Status: AC
  Administered 2021-03-04: 50 ug via INTRAVENOUS
  Filled 2021-03-04: qty 2

## 2021-03-04 NOTE — Op Note (Signed)
[facility] Patient Name: Taylor [name] Procedure Date : 02/17/2021 MRN: [id number] Attending MD: Georganna Skeans , MD Date of Birth: November 01, 1965 CSN: [id number] Age: 56 Admit Type: Inpatient Procedure:                Upper GI endoscopy Indications:              Place PEG because patient is unable to eat due to                            stroke (CVA) Providers:                Georganna Skeans, MD, Melina Modena, PA-C, Ladonna Snide, Technician Referring MD:              Medicines:                Fentanyl 50 micrograms IV, versed 2mg  Complications:            No immediate complications. Estimated Blood Loss:     Estimated blood loss was minimal. Procedure:                Pre-Anesthesia Assessment:                           - Prior to the procedure, a History and Physical                            was performed, and patient medications and                            allergies were reviewed. The patient is unable to                            give consent secondary to the patient's altered                            mental status. The risks and benefits of the                            procedure and the sedation options and risks were                            discussed with the patient's daughter. All                            questions were answered and informed consent was                            obtained. Patient identification and proposed                            procedure were verified by the physician, the nurse  and the technician in the procedure room. Mental                            Status Examination: lethargic. ASA Grade                            Assessment: III - A patient with severe systemic                            disease. After reviewing the risks and benefits,                            the patient was deemed in satisfactory condition to                            undergo the  procedure. The anesthesia plan was to                            use deep sedation / analgesia. Immediately prior to                            administration of medications, the patient was                            re-assessed for adequacy to receive sedatives. The                            heart rate, respiratory rate, oxygen saturations,                            blood pressure, adequacy of pulmonary ventilation,                            and response to care were monitored throughout the                            procedure. The physical status of the patient was                            re-assessed after the procedure.                           After obtaining informed consent, the endoscope was                            passed under direct vision. Throughout the                            procedure, the patient's blood pressure, pulse, and                            oxygen saturations were monitored continuously. The  [id number] Olympus endoscope was introduced                            through the mouth, and advanced to the duodenal                            bulb. The upper GI endoscopy was accomplished                            without difficulty. The patient tolerated the                            procedure well. Scope In: Scope Out: Findings:      No gross lesions were noted in the esophagus.      No gross lesions were noted in the stomach. Placement of an externally       removable PEG with no T-fasteners was successfully completed. The       external bumper was at the 3.0 cm marking on the tube. Estimated blood       loss was minimal.      No gross lesions were noted in the duodenal bulb. Impression:               - No gross lesions in esophagus.                           - No gross lesions in the stomach.                           - No gross lesions in the duodenal bulb.                           - An externally removable PEG  placement was                            successfully completed.                           - No specimens collected. Recommendation:           meds now, resume TF 4h or after trach per CCM Procedure Code(s):        --- Professional ---                           [phone], Esophagogastroduodenoscopy, flexible,                            transoral; with directed placement of percutaneous                            gastrostomy tube Diagnosis Code(s):        --- Professional ---                           [id number], Other sequelae of cerebral infarction  R63.3, Feeding difficulties                           Z43.1, Encounter for attention to gastrostomy CPT copyright 2019 [employer]. All rights reserved. The codes documented in this report are preliminary and upon coder review may  be revised to meet current compliance requirements. Georganna Skeans, MD 03/12/2021 11:42:32 AM This report has been signed electronically. Number of Addenda: 0

## 2021-03-04 NOTE — Procedures (Signed)
Bedside Tracheostomy Insertion Procedure Note   Patient Details:   Name: Taylor [name] DOB: 1965-11-20 MRN: [id number]  Procedure: Tracheostomy  Pre Procedure Assessment: ET Tube Size: ET Tube secured at lip (cm): 21 Bite block in place: Yes Breath Sounds: Clear and Diminished  Post Procedure Assessment: BP 99/68    Pulse 88    Temp (!) 102.1 F (38.9 C) (Axillary)    Resp 18    Ht 5\' 1"  (1.549 m)    Wt 60 kg    SpO2 100%    BMI 24.99 kg/m  O2 sats: stable throughout Complications: No apparent complications Patient did tolerate procedure well Tracheostomy Brand:Shiley Tracheostomy Style:Cuffed Tracheostomy Size:  6 Tracheostomy Secured Tracheostomy Placement Confirmation:Trach cuff visualized and in place and Chest X ray ordered for placement    HDI:[id number] 03/06/2021, 3:22 PM

## 2021-03-04 NOTE — Progress Notes (Signed)
OT Cancellation Note  Patient Details Name: Taylor [name] MRN: [id number] DOB: March 30, 1965   Cancelled Treatment:    Reason Eval/Treat Not Completed: Patient at procedure or test/ unavailable (Will return as schedule allows.)  Taylor [name] MSOT, OTR/L Acute Rehab Pager: [phone] Office: [phone] 02/27/2021, 11:21 AM

## 2021-03-04 NOTE — Progress Notes (Signed)
PT Cancellation Note  Patient Details Name: Taylor [name] MRN: [id number] DOB: 02/15/1966   Cancelled Treatment:    Reason Eval/Treat Not Completed: Patient at procedure or test/unavailable - trach placement, PT to check back.   Marye Round, PT DPT Acute Rehabilitation Services Pager [phone]  Office [phone]    Truddie Coco 03/09/2021, 11:43 AM

## 2021-03-04 NOTE — Progress Notes (Addendum)
Patient ID: Taylor [name], female   DOB: 01/13/1966, 56 y.o.   MRN: [id number] 20 Days Post-Op    Subjective: On vent  ROS negative except as listed above. Objective: Vital signs in last 24 hours: Temp:  [98.5 F (36.9 C)-103.4 F (39.7 C)] 98.5 F (36.9 C) (01/16 0754) Pulse Rate:  [85-124] 101 (01/16 0716) Resp:  [16-30] 18 (01/16 0716) BP: (72-118)/(53-90) 104/70 (01/16 0700) SpO2:  [97 %-100 %] 99 % (01/16 0716) FiO2 (%):  [30 %] 30 % (01/16 0716) Last BM Date: 03/03/21  Intake/Output from previous day: 01/15 0701 - 01/16 0700 In: 1443.2 [I.V.:192.9; NG/GT:1010; IV Piggyback:240.3] Out: 1635 [Urine:1085; Stool:550] Intake/Output this shift: No intake/output data recorded.  General appearance: no distress Resp: clear to auscultation bilaterally Cardio: regular rate and rhythm GI: soft, NT  Lab Results: CBC  Recent Labs    03/03/21 0224 02/18/2021 0342  WBC 14.3* 13.3*  HGB 14.1 12.9  HCT 47.0* 44.0  PLT 243 204   BMET Recent Labs    03/03/21 0224 03/05/2021 0536  NA 147* 148*  K 3.6 3.8  CL 109 108  CO2 31 28  GLUCOSE 200* 120*  BUN 38* 44*  CREATININE 0.94 1.05*  CALCIUM 7.8* 8.5*   PT/INR No results for input(s): LABPROT, INR in the last 72 hours. ABG No results for input(s): PHART, HCO3 in the last 72 hours.  Invalid input(s): PCO2, PO2  Studies/Results: DG CHEST PORT 1 VIEW  Result Date: 03/02/2021 CLINICAL DATA:  Intubated patient. Follow-up exam. Fever/pneumonia. EXAM: PORTABLE CHEST 1 VIEW COMPARISON:  02/24/2021 and older studies. FINDINGS: Left lung base, retrocardiac, opacity appears improved. Remainder of the lungs is clear. No convincing pleural effusion.  No pneumothorax. Cardiac silhouette normal in size. Endotracheal tube stable, tip 2.8 cm above the carina. Enteric feeding tube passes below the diaphragm. IMPRESSION: 1. Mild interval improvement in opacity at the left lung base consistent with improved infection or atelectasis. Remainder  of the lungs remains clear. 2. Stable well-positioned support apparatus. Electronically Signed   By: Lajean Manes M.D.   On: 03/02/2021 10:30   DG Abd Portable 1V  Result Date: 03/02/2021 CLINICAL DATA:  Confirm orogastric tube placement. EXAM: PORTABLE ABDOMEN - 1 VIEW COMPARISON:  02/15/2021. FINDINGS: Enteric tube has been replaced with nasal/orogastric tube. Tip projects in the right mid abdomen consistent with positioning in the distal stomach. Normal bowel gas pattern. IMPRESSION: Well-positioned nasal/orogastric tube. Electronically Signed   By: Lajean Manes M.D.   On: 03/02/2021 12:31    Anti-infectives: Anti-infectives (From admission, onward)    Start     Dose/Rate Route Frequency Ordered Stop   03/02/21 1600  ceFEPIme (MAXIPIME) 2 g in sodium chloride 0.9 % 100 mL IVPB        2 g 200 mL/hr over 30 Minutes Intravenous Every 12 hours 03/02/21 1541     02/26/21 1400  ceFAZolin (ANCEF) IVPB 2g/100 mL premix  Status:  Discontinued        2 g 200 mL/hr over 30 Minutes Intravenous Every 8 hours 02/26/21 1239 03/02/21 1542   02/25/21 1400  ceFEPIme (MAXIPIME) 2 g in sodium chloride 0.9 % 100 mL IVPB  Status:  Discontinued        2 g 200 mL/hr over 30 Minutes Intravenous Every 8 hours 02/25/21 1253 02/26/21 1239   02/25/21 1200  vancomycin (VANCOREADY) IVPB 1250 mg/250 mL  Status:  Discontinued        1,250 mg 166.7 mL/hr over 90 Minutes  Intravenous Every 24 hours 02/24/21 1024 02/25/21 0917   02/24/21 2000  ceFEPIme (MAXIPIME) 2 g in sodium chloride 0.9 % 100 mL IVPB  Status:  Discontinued        2 g 200 mL/hr over 30 Minutes Intravenous Every 8 hours 02/24/21 1024 02/25/21 0918   02/24/21 1015  ceFEPIme (MAXIPIME) 2 g in sodium chloride 0.9 % 100 mL IVPB        2 g 200 mL/hr over 30 Minutes Intravenous  Once 02/24/21 0925 02/24/21 1055   02/24/21 1015  vancomycin (VANCOREADY) IVPB 1250 mg/250 mL        1,250 mg 166.7 mL/hr over 90 Minutes Intravenous  Once 02/24/21 0925 02/24/21  1256   02/18/21 2200  ceFAZolin (ANCEF) IVPB 2g/100 mL premix        2 g 200 mL/hr over 30 Minutes Intravenous Every 8 hours 02/18/21 0946 02/21/21 2213   02/17/21 1000  cefTRIAXone (ROCEPHIN) 1 g in sodium chloride 0.9 % 100 mL IVPB  Status:  Discontinued        1 g 200 mL/hr over 30 Minutes Intravenous Every 24 hours 02/17/21 0905 02/18/21 0946   02/15/21 1000  ceFEPIme (MAXIPIME) 2 g in sodium chloride 0.9 % 100 mL IVPB  Status:  Discontinued        2 g 200 mL/hr over 30 Minutes Intravenous Every 8 hours 02/15/21 0941 02/17/21 0905       Assessment/Plan: B basilar CVA - for PEG at bedside today. Consent obtained from daughter. I D/W Dr. Leonie Man. Moderate medical decision making.  LOS: 20 days    Georganna Skeans, MD, MPH, FACS Trauma & General Surgery Use [email or web address] to contact on call provider  03/07/2021

## 2021-03-04 NOTE — Procedures (Signed)
Percutaneous Tracheostomy Procedure Note   Taylor [name]  [id number]  05-Feb-1966  Date:03/11/2021  Time:2:42 PM   Provider Performing:Arnitra Sokoloski  Procedure: Percutaneous Tracheostomy with Bronchoscopic Guidance ([id number])  Indication(s) Prolonged mechanical ventilation in patient with stroke.   Consent Risks of the procedure as well as the alternatives and risks of each were explained to the patient and/or caregiver.  Consent for the procedure was obtained.  Anesthesia Etomidate, Versed, Fentanyl, Vecuronium   Time Out Verified patient identification, verified procedure, site/side was marked, verified correct patient position, special equipment/implants available, medications/allergies/relevant history reviewed, required imaging and test results available.   Sterile Technique Maximal sterile technique including sterile barrier drape, hand hygiene, sterile gown, sterile gloves, mask, hair covering.    Procedure Description Appropriate anatomy identified by palpation.  Patient's neck prepped and draped in sterile fashion.  1% lidocaine with epinephrine was used to anesthetize skin overlying neck.  1.5cm incision made and blunt dissection performed until tracheal rings could be easily palpated.   Then a size 6 Shiley tracheostomy was placed under bronchoscopic visualization using usual Seldinger technique and serial dilation.   Bronchoscope confirmed placement above the carina.  Tracheostomy was sutured in place with adhesive pad to protect skin under pressure.   Patient connected to ventilator.   Complications/Tolerance None; patient tolerated the procedure well. Chest X-ray is ordered to confirm no post-procedural complication.   EBL Minimal   Specimen(s) None   Kipp Brood, MD [facility] ICU Physician [place]  Pager: [phone] Or Epic Secure Chat After hours: [phone].  03/08/2021, 2:43 PM

## 2021-03-04 NOTE — Progress Notes (Signed)
NAME:  Taylor [name], MRN:  [id number], DOB:  05/14/1965, LOS: 85 ADMISSION DATE:  01/30/2021, CONSULTATION DATE:  12/27 REFERRING MD:  Dr Tennis Must Sindy Messing , CHIEF COMPLAINT:   CVA  History of Present Illness:  56 year old female with PMH as below, which is significant for HFrEF with LVEF 20-25 % (2017), DM, and OSA on CPAP. 12/27 she presented to [facility] ED with complaints of acute onset left sided weakness and slurred speech. EMS noted non-verbal and right gaze preference. Imaging consistent with occlusion of the distal basilar arterty, right P1, and proximal R P2. She was given systemic TNK and mechanical thrombectomy  with stent retriever on the right with complete recanalization after one pass (TICI3). PCCM consulted for vent management and ICU care.    Pertinent  Medical History   has a past medical history of CHF (congestive heart failure) ([place]), Diabetes mellitus without complication ([place]), and Obstructive sleep apnea.   Significant Hospital Events: Including procedures, antibiotic start and stop dates in addition to other pertinent events   12/27 admit for posterior circulation stroke. TNK given and IR throbmectomy 12/28 echo with LV thrombus, EF 20-25%.  Started on heparin 12/30 failing weaning trials.  Started on cefepime for positive UA 1/1 On PSV weans 1/4: patient extubated and developed stridor and increase wob; required reintubation; decadron and lasix given 1/6 restarted Decadron since there is no cuff leak.  Lasix dose increased 1/9 Still waiting on family to decide on tracheostomy  1/13 Diuresing, urinary retention foley placed 1/14 diuresis held, febrile, tachycardic, placed on cefepime 1/15 febrile still, no source of infection identified 1/16 creatinine creeping up suspect hypovolemia, increased losses with fever  Interim History / Subjective:   Febrile still. Cefepime continues, UA clear, CXR clear.  Objective   Blood pressure 104/70, pulse (!) 101,  temperature 98.5 F (36.9 C), temperature source Axillary, resp. rate 18, height 5\' 1"  (1.549 m), weight 60 kg, SpO2 99 %.    Vent Mode: PRVC FiO2 (%):  [30 %] 30 % Set Rate:  [8 bmp-18 bmp] 18 bmp Vt Set:  [380 mL] 380 mL PEEP:  [5 cmH20] 5 cmH20 Plateau Pressure:  [17 cmH20-19 cmH20] 18 cmH20   Intake/Output Summary (Last 24 hours) at 03/09/2021 0817 Last data filed at 03/15/2021 0700 Gross per 24 hour  Intake 1347.21 ml  Output 1585 ml  Net -237.79 ml    Filed Weights   02/25/21 0500 02/26/21 0500 02/28/21 0500  Weight: 60.2 kg 60.2 kg 60 kg    Examination: Gen:      No acute distress, frail, appears malnourished.  HEENT:  EOMI, sclera anicteric Neck:     No masses; no thyromegaly ET tube Lungs:    Clear to auscultation bilaterally; normal respiratory effort CV:         Regular rate and rhythm; no murmurs Abd:      + bowel sounds; soft, non-tender; no palpable masses, no distension Ext:    No edema; adequate peripheral perfusion Skin:      Warm and dry; no rash Neuro:   Somnolent, not following commands for me    Assessment & Plan:   CVA: basilar, bilateral PI/PCA. TNK given in ED and she is s/p mechanical thrombectomy on the right and mechanical thrombectomy with stent placement on the left.  Acute resp failure, inability to protect airway due to stroke Failed extubation on 1/4 due to stridor, likely Tracheal Edema/Stenosis due to endotracheal tube History of OSA on  CPAP Chronic HFrEF: LVEF 20-25% with LV thrombus E. coli UTI, MSSA pneumonia Type 2 DM Fever  Plan:  - PRVC, did not tolerated PSV 1/15 - Plan tracheostomy and PEG bedside 10 am 1/16 - Anticipate slow recovery.  -  Unable to tolerate guideline directed heart failure therapy due to hypotension requiring midodrine. -Continue Diuresis holiday 1/14 - tachy, Cr bump, fever -Completed 7 days of antibiotics for E. coli UTI and MSSA pneumonia -Continue IV heparin for LV thrombus -Continue stroke  rehabilitation as tolerated -Continue secondary stroke prevention with statin and Brilinta. -Fever 1/14 - blood cultures, CXR clear, Ua clear, trach aspirate pending, on therapeutic AC so DVT unlikely, cefepime added, suspect central fever vs grug fever - developing AKI - suspect pre -renal with fever, preceding diuresis, 500 cc LR bolus this AM  Best Practice (right click and "Reselect all SmartList Selections" daily)   Diet/type: tubefeeds DVT prophylaxis: systemic heparin GI prophylaxis: PPI Lines: N/A Foley:  Yes, and it is still needed Code Status:  full code Last date of multidisciplinary goals of care discussion [1/6 updated brandon son over phone ]  Critical care time:   CRITICAL CARE Performed by: Bonna Gains Kourosh Jablonsky   Total critical care time: 35 minutes  Critical care time was exclusive of separately billable procedures and treating other patients.  Critical care was necessary to treat or prevent imminent or life-threatening deterioration.  Critical care was time spent personally by me on the following activities: development of treatment plan with patient and/or surrogate as well as nursing, discussions with consultants, evaluation of patient's response to treatment, examination of patient, obtaining history from patient or surrogate, ordering and performing treatments and interventions, ordering and review of laboratory studies, ordering and review of radiographic studies, pulse oximetry and re-evaluation of patient's condition.   Lanier Clam, MD See Amion for contact info  03/12/2021, 8:17 AM

## 2021-03-04 NOTE — Procedures (Signed)
Diagnostic Bronchoscopy  Taylor [name]  [id number]  1965/08/10  Date:03/06/2021  Time:2:42 PM   Provider Performing:Taylor [name]   Procedure: Diagnostic Bronchoscopy ([id number])  Indication(s) Assist with direct visualization of tracheostomy placement  Consent Risks of the procedure as well as the alternatives and risks of each were explained to the patient and/or caregiver.  Consent for the procedure was obtained.   Anesthesia See separate tracheostomy note   Time Out Verified patient identification, verified procedure, site/side was marked, verified correct patient position, special equipment/implants available, medications/allergies/relevant history reviewed, required imaging and test results available.   Sterile Technique Usual hand hygiene, masks, gowns, and gloves were used   Procedure Description Bronchoscope advanced through endotracheal tube and into airway.  After suctioning out tracheal secretions, bronchoscope used to provide direct visualization of tracheostomy placement.   Complications/Tolerance None; patient tolerated the procedure well.   EBL None  Specimen(s) None     Taylor [name], ACNP [facility] 02/28/2021, 2:42 PM  See Amion for pager If no response to pager, please call PCCM consult pager After 7:00 pm call Elink

## 2021-03-04 NOTE — Progress Notes (Signed)
ANTICOAGULATION CONSULT NOTE  Pharmacy Consult for Heparin Indication:  LV thrombus, s/p CVA   No Known Allergies  Patient Measurements: Height: 5\' 1"  (154.9 cm) Weight: 60 kg (132 lb 4.4 oz) IBW/kg (Calculated) : 47.8 Heparin Dosing Weight: 60kg  Vital Signs: Temp: 101 F (38.3 C) (01/16 1600) Temp Source: Axillary (01/16 1600) BP: 99/68 (01/16 1430) Pulse Rate: 88 (01/16 1441)  Labs: Recent Labs    03/02/21 03/06/2021 03/02/21 0733 03/02/21 1552 03/03/21 0224 03/03/21 1151 03/14/2021 0342 02/23/2021 0536  HGB 14.7  --   --  14.1  --  12.9  --   HCT 48.7*  --   --  47.0*  --  44.0  --   PLT 256  --   --  243  --  204  --   HEPARINUNFRC  --    < > 0.74* 0.57 0.55  --   --   CREATININE 0.98  --   --  0.94  --   --  1.05*   < > = values in this interval not displayed.    Estimated Creatinine Clearance: 49.2 mL/min (A) (by C-G formula based on SCr of 1.05 mg/dL (H)).   Assessment: 55 YOF continuing heparin for new LV thrombus in setting of acute CVA. S/p mechanical thrombectomy and TNK.    Heparin stopped for procedures - discussed with CCM, ok to resume at 1900 tonight.  Goal of Therapy:  Heparin level 0.3-0.5 units/ml Monitor platelets by anticoagulation protocol: Yes   Plan:  Resume heparin 550 units/h no bolus at 1900 Check heparin level in 8h since its been off all day   03/06/21, PharmD, BCPS, [facility] Clinical Pharmacist [phone] Please check AMION for all [facility] Pharmacy numbers 02/18/2021

## 2021-03-04 NOTE — TOC Initial Note (Signed)
Transition of Care [facility]) - Initial/Assessment Note    Patient Details  Name: Taylor [name] MRN: [id number] Date of Birth: 1965-04-01  Transition of Care [facility]) CM/SW Contact:    Ella Bodo, RN Phone Number: 02/26/2021, 3:51 PM  Clinical Narrative:                 56 y/o female presented to ED on 12/27 for L sided paralysis, R gaze, slurred speech, and L facial droop. TNK given. S/p mechanical thrombectomy of bilateral occluded P1/PCA. MRI showed acute R PCA infarcts including infarcts in the R thalamus and R occipital lobe with mild mass effect with partial effacement of 3rd ventricle, numerous small acute infarcts in bilateral cerebellar hemispheres and few scattered small acute infarcts in bilateral parietal lobes, L occipital and L frontal lobes. Intubated 12/27; s/p tracheostomy on 03/09/2021.    Pt would likely be a good candidate for LTAC hospital, and order for LTAC eval obtained from MD.  Referrals made to both area LTAC's to check eligibility.  Will follow up with family when patient choice once eligibility confirmed.  Expected Discharge Plan: Long Term Acute Care (LTAC) Barriers to Discharge: Continued Medical Work up           Expected Discharge Plan and Services Expected Discharge Plan: [place] (LTAC)   Discharge Planning Services: CM Consult   Living arrangements for the past 2 months: Single Family Home                                      Prior Living Arrangements/Services Living arrangements for the past 2 months: Single Family Home Lives with:: Adult Children Patient language and need for interpreter reviewed:: Yes        Need for Family Participation in Patient Care: Yes (Comment) Care giver support system in place?: Yes (comment)   Criminal Activity/Legal Involvement Pertinent to Current Situation/Hospitalization: No - Comment as needed         Emotional Assessment Appearance:: Appears stated age Attitude/Demeanor/Rapport:  Unable to Assess Affect (typically observed): Unable to Assess        Admission diagnosis:  Acute ischemic stroke [facility]) [I63.9] Patient Active Problem List   Diagnosis Date Noted   Encounter for postanesthesia care 02/13/2021   Endotracheally intubated 02/13/2021   Heart failure with reduced ejection fraction ([place]) 02/13/2021   Acute ischemic stroke ([place]) 02/07/2021   Dizziness 10/23/2020   Hypotension 10/23/2020   CHF (congestive heart failure) ([place]) 10/21/2020   Sepsis ([place]) 08/31/2018   CAP (community acquired pneumonia) 08/31/2018   Acute respiratory failure with hypoxia ([place]) 08/31/2018   Obstructive sleep apnea 09/03/2016   Type 2 diabetes mellitus ([place]) 09/14/2015   Tachycardia 09/14/2015   Acute on chronic systolic CHF (congestive heart failure) ([place]) 08/30/2015   PCP:  Kirk Ruths, MD Pharmacy:   [place], [place] [facility] [address] [place] [address] Phone: [phone] Fax: [phone]     Social Determinants of Health (SDOH) Interventions    Readmission Risk Interventions No flowsheet data found.  Reinaldo Raddle, RN, BSN  Trauma/Neuro ICU Case Manager [phone]

## 2021-03-04 NOTE — Progress Notes (Addendum)
STROKE TEAM PROGRESS NOTE   INTERVAL HISTORY:  Patient is seen in her room with her husband at bedside.  She continues to be febrile with a Tmax of 102.1.  She has been largely hemodynamically stable with an episode of hypotension around 2000 last night, and her neurological exam is unchanged. Plan ins for patient to receive a tracheostomy and PEG tube today.  Vitals:   02/21/2021 0900 03/15/2021 1000 03/14/2021 1037 02/27/2021 1200  BP: 109/68 102/67    Pulse: (!) 104 99 99   Resp: (!) 24 (!) 21 (!) 23   Temp:    (!) 102.1 F (38.9 C)  TempSrc:    Axillary  SpO2: 99% 99% 99%   Weight:      Height:       CBC:  Recent Labs  Lab 03/03/21 0224 03/17/2021 0342  WBC 14.3* 13.3*  HGB 14.1 12.9  HCT 47.0* 44.0  MCV 95.7 96.5  PLT 243 0000000    Basic Metabolic Panel:  Recent Labs  Lab 02/27/21 0333 03/01/21 0040 03/03/21 0224 03/08/2021 0536  NA 140   < > 147* 148*  K 4.1   < > 3.6 3.8  CL 99   < > 109 108  CO2 31   < > 31 28  GLUCOSE 181*   < > 200* 120*  BUN 30*   < > 38* 44*  CREATININE 0.78   < > 0.94 1.05*  CALCIUM 8.5*   < > 7.8* 8.5*  MG 2.6*  --   --   --   PHOS 2.8  --   --   --    < > = values in this interval not displayed.    Lipid Panel:  No results for input(s): CHOL, TRIG, HDL, CHOLHDL, VLDL, LDLCALC in the last 168 hours.  HgbA1c:  No results for input(s): HGBA1C in the last 168 hours.  Urine Drug Screen: No results for input(s): LABOPIA, COCAINSCRNUR, LABBENZ, AMPHETMU, THCU, LABBARB in the last 168 hours.  Alcohol Level  No results for input(s): ETH in the last 168 hours.   IMAGING past 24 hours No results found.  PHYSICAL EXAM  Temp:  [98.5 F (36.9 C)-102.1 F (38.9 C)] 102.1 F (38.9 C) (01/16 1200) Pulse Rate:  [85-117] 99 (01/16 1037) Resp:  [16-29] 23 (01/16 1037) BP: (72-118)/(53-90) 102/67 (01/16 1000) SpO2:  [97 %-100 %] 99 % (01/16 1037) FiO2 (%):  [30 %] 30 % (01/16 1037)  General - [name], intubated off  sedation.  Cardiovascular - Regular rhythm but tachycardia.  Neuro - intubated off sedation, arousable but very lethargic, follows occasional commands but less active.   Right gaze preference barely cross midline.  PERRL, does not blink to threat on left side. Sensation, coordination and gait not tested.   ASSESSMENT/PLAN Ms. SAMSARA TEDESCO is a 56 y.o. female with history of CHF, T2DM and OSA presenting with acute onset left-sided weakness, right gaze deviation and slurred speech. She was found to have occlusion of the distal basilar artery, right P1 and proximal right P2.  She was given TNK and received mechanical thrombectomy to the right and left PCA with stenting on the left. She was found to have two apical thrombi on her 2D echocardiogram and remains on IV heparin. Extubated and then emergently reintubated on 02/20/2021.  She weaned on pressure support 5/5 with an FiO2 30% on 1/8, however unable to wean in the morning on 1/9.  CCM and steroid and diuretic course  completed 02/23/2021 for swollen airway. WBC 18.0, respiratory, blood, and urine cultures sent. Vanco and cefepime started 02/24/2021. Per family conversation, plan for tracheostomy and PEG today.   Stroke:  embolic shower with b/l PCA occlusion s/p IR with TICI3 and left PCA stenting, likely secondary due to embolism from low EF and apical thrombi Code Stroke CT head No acute abnormality.  ASPECTS 10.    CTA head & neck nonopacification of distal basilar artery, right P1 and P2, poor opacification of right vertebral artery, focal stenosis in right A1 and A3 IR with b/l P1 occlusion s/p thrombectomy with TICI3 and left PCA stent MRI  Acute right PCA territory infarct, in right thalamus and occipital lobe, small acute infarcts in bilateral cerebellar hemispheres with scattered small acute infarcts in bilateral parietal lobes, left occipital and left frontal lobes, area of susceptibility infarct in right occipital lobe suggestive of petechial  hemorrhage MRA thrombectomy and placement of left P1/P2 stent with good flow related signal in distal left PCA, poor flow signal in distal basilar tip, poor opacification of right vertebral artery, right A1 and A3 ACA stenosis 2D Echo global hypokinesis with inferior akinesis, EF 0000000, grade 3 diastolic dysfunction, thrombi in inferoapical and anteroapical areas, no atrial level shunt LDL 151 HgbA1c 11.9 VTE prophylaxis -Heparin IV aspirin 81 mg daily prior to admission, Now on heparin IV and Brilinta.  Transition to PO AC once extubated with p.o. access. 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% Two apical thrombi present Now on heparin IV and Brilinta  Respiratory failure Currently intubated and ventilated Management per CCM Reintubated 1/4 for not tolerating extubation Vocal cord edema- steroids completed Continue diuresis today Plan for trach today   Hx of hypertension Hypotension Home meds:  lisinopril 2.5 mg daily Stable on the low end SBP 90-100s Off Levophed On midodrine 10 mg every 8h Long-term BP goal normotensive  Fever Leukocytosis UTI WBC >15.0 -> 16.1-> 18.0-> 15.2-> 15.4-> 13.5-> 16.1-> 14.3-> 13.3 T-max 102.1 UA WBC > 50 Urine culture Gram neg rods. 02/17/21 E Coli Sensitive to all antibiotics tested On cefepime -completed course Management per CCM on vanco and cefepime 02/24/2021 -> ancef  Hyperlipidemia Home meds:  atorvastatin 40 mg daily, resumed in hospital LDL 151, goal < 70 Increase to 80 mg atorvastatin daily  Continue statin at discharge  Diabetes type II Uncontrolled Home meds:  Jardiance 25 mg daily, levemir 17 units BID, metformin 1000 mg BID HgbA1c 11.9, goal < 7.0 CBGs Diabetes coordinator consult SSI- 0-20u q4  Other Stroke Risk Factors Former cigarette smoker Obstructive sleep apnea  Other Active Problems Hyperkalemia - 5.3 - > 5.0-> 4.2  (resolved) Dysphagia on tube feeding @ [facility] day # 20  Patient seen and examined by NP/APP with MD. MD to update note as needed.   S[place] , MSN, AGACNP-BC [facility] See Amion for schedule and pager information 03/03/2021 12:12 PM  I have personally obtained history,examined this patient, reviewed notes, independently viewed imaging studies, participated in medical decision making and plan of care.ROS completed by me personally and pertinent positives fully documented  I have made any additions or clarifications directly to the above note. Agree with note above.  Patient remains febrile but white count is declined to 13.3.  Plan for tracheostomy and PEG tube today.  Long discussion with the bedside with the patient's husband and answered questions.  Discussed with Dr. Grandville Silos and  Dr. Silas Flood.This patient is critically ill and at significant risk of neurological worsening, death and care requires constant monitoring of vital signs, hemodynamics,respiratory and cardiac monitoring, extensive review of multiple databases, frequent neurological assessment, discussion with family, other specialists and medical decision making of high complexity.I have made any additions or clarifications directly to the above note.This critical care time does not reflect procedure time, or teaching time or supervisory time of PA/NP/Med Resident etc but could involve care discussion time.  I spent 30 minutes of neurocritical care time  in the care of  this patient.      Antony Contras, MD Medical Director [place] Pager: [phone] 03/03/2021 1:27 PM  To contact Stroke Continuity provider, please refer to [email or web address] After hours, contact General Neurology

## 2021-03-05 ENCOUNTER — Encounter (HOSPITAL_COMMUNITY): Payer: Self-pay | Admitting: General Surgery

## 2021-03-05 DIAGNOSIS — I639 Cerebral infarction, unspecified: Secondary | ICD-10-CM | POA: Diagnosis not present

## 2021-03-05 LAB — GLUCOSE, CAPILLARY
Glucose-Capillary: 101 mg/dL — ABNORMAL HIGH (ref 70–99)
Glucose-Capillary: 142 mg/dL — ABNORMAL HIGH (ref 70–99)
Glucose-Capillary: 152 mg/dL — ABNORMAL HIGH (ref 70–99)
Glucose-Capillary: 184 mg/dL — ABNORMAL HIGH (ref 70–99)
Glucose-Capillary: 193 mg/dL — ABNORMAL HIGH (ref 70–99)
Glucose-Capillary: 93 mg/dL (ref 70–99)

## 2021-03-05 LAB — HEPATIC FUNCTION PANEL
ALT: 32 U/L (ref 0–44)
AST: 82 U/L — ABNORMAL HIGH (ref 15–41)
Albumin: 2.2 g/dL — ABNORMAL LOW (ref 3.5–5.0)
Alkaline Phosphatase: 185 U/L — ABNORMAL HIGH (ref 38–126)
Bilirubin, Direct: 0.3 mg/dL — ABNORMAL HIGH (ref 0.0–0.2)
Indirect Bilirubin: 0.8 mg/dL (ref 0.3–0.9)
Total Bilirubin: 1.1 mg/dL (ref 0.3–1.2)
Total Protein: 6.6 g/dL (ref 6.5–8.1)

## 2021-03-05 LAB — CBC WITH DIFFERENTIAL/PLATELET
Abs Immature Granulocytes: 0.08 10*3/uL — ABNORMAL HIGH (ref 0.00–0.07)
Basophils Absolute: 0 10*3/uL (ref 0.0–0.1)
Basophils Relative: 0 %
Eosinophils Absolute: 0.1 10*3/uL (ref 0.0–0.5)
Eosinophils Relative: 1 %
HCT: 41.7 % (ref 36.0–46.0)
Hemoglobin: 12.4 g/dL (ref 12.0–15.0)
Immature Granulocytes: 1 %
Lymphocytes Relative: 8 %
Lymphs Abs: 1.1 10*3/uL (ref 0.7–4.0)
MCH: 29 pg (ref 26.0–34.0)
MCHC: 29.7 g/dL — ABNORMAL LOW (ref 30.0–36.0)
MCV: 97.4 fL (ref 80.0–100.0)
Monocytes Absolute: 1.3 10*3/uL — ABNORMAL HIGH (ref 0.1–1.0)
Monocytes Relative: 9 %
Neutro Abs: 11.7 10*3/uL — ABNORMAL HIGH (ref 1.7–7.7)
Neutrophils Relative %: 81 %
Platelets: 202 10*3/uL (ref 150–400)
RBC: 4.28 MIL/uL (ref 3.87–5.11)
RDW: 18.3 % — ABNORMAL HIGH (ref 11.5–15.5)
WBC: 14.4 10*3/uL — ABNORMAL HIGH (ref 4.0–10.5)
nRBC: 0.2 % (ref 0.0–0.2)

## 2021-03-05 LAB — BASIC METABOLIC PANEL
Anion gap: 13 (ref 5–15)
BUN: 47 mg/dL — ABNORMAL HIGH (ref 6–20)
CO2: 24 mmol/L (ref 22–32)
Calcium: 8.2 mg/dL — ABNORMAL LOW (ref 8.9–10.3)
Chloride: 110 mmol/L (ref 98–111)
Creatinine, Ser: 1.35 mg/dL — ABNORMAL HIGH (ref 0.44–1.00)
GFR, Estimated: 46 mL/min — ABNORMAL LOW (ref >60)
Glucose, Bld: 129 mg/dL — ABNORMAL HIGH (ref 70–99)
Potassium: 3.9 mmol/L (ref 3.5–5.1)
Sodium: 147 mmol/L — ABNORMAL HIGH (ref 135–145)

## 2021-03-05 LAB — CULTURE, RESPIRATORY W GRAM STAIN: Gram Stain: NONE SEEN

## 2021-03-05 LAB — CBC
HCT: 41 % (ref 36.0–46.0)
Hemoglobin: 12.1 g/dL (ref 12.0–15.0)
MCH: 28.7 pg (ref 26.0–34.0)
MCHC: 29.5 g/dL — ABNORMAL LOW (ref 30.0–36.0)
MCV: 97.2 fL (ref 80.0–100.0)
Platelets: 223 10*3/uL (ref 150–400)
RBC: 4.22 MIL/uL (ref 3.87–5.11)
RDW: 18.2 % — ABNORMAL HIGH (ref 11.5–15.5)
WBC: 14.3 10*3/uL — ABNORMAL HIGH (ref 4.0–10.5)
nRBC: 0 % (ref 0.0–0.2)

## 2021-03-05 LAB — HEPARIN LEVEL (UNFRACTIONATED)
Heparin Unfractionated: 0.18 IU/mL — ABNORMAL LOW (ref 0.30–0.70)
Heparin Unfractionated: 0.43 IU/mL (ref 0.30–0.70)

## 2021-03-05 LAB — PROCALCITONIN: Procalcitonin: 0.87 ng/mL

## 2021-03-05 MED ORDER — LACTATED RINGERS IV SOLN: INTRAVENOUS | Status: DC

## 2021-03-05 MED ORDER — LACTATED RINGERS IV BOLUS
500.0000 mL | Freq: Once | INTRAVENOUS | Status: AC
  Administered 2021-03-05: 500 mL via INTRAVENOUS

## 2021-03-05 NOTE — Progress Notes (Addendum)
STROKE TEAM PROGRESS NOTE   INTERVAL HISTORY:  Patient is seen in her room with no family at the bedside.  She continues to be febrile and has been hypotensive at times.  Tracheostomy and PEG tube have been placed.  Will transition from heparin to Eliquis tomorrow, once patient is 24 hours post trach and PEG tube placement.  She remains tachycardic but hemodynamically stable and remains febrile PEG to site looks good.  Abdomen is soft with binder on to protect PEG tube Vitals:   03/05/21 0900 03/05/21 1000 03/05/21 1038 03/05/21 1200  BP: 108/72 116/74    Pulse: (!) 112 (!) 116    Resp: 18 (!) 33 (!) 30   Temp:    (!) 101.6 F (38.7 C)  TempSrc:    Axillary  SpO2: 98% 98% 98%   Weight:      Height:       CBC:  Recent Labs  Lab 03/03/2021 0342 03/05/21 0108  WBC 13.3* 14.4*   14.3*  NEUTROABS  --  11.7*  HGB 12.9 12.4   12.1  HCT 44.0 41.7   41.0  MCV 96.5 97.4   97.2  PLT 204 202   [id number]    Basic Metabolic Panel:  Recent Labs  Lab 02/27/21 0333 03/01/21 0040 02/21/2021 0536 03/05/21 0108  NA 140   < > 148* 147*  K 4.1   < > 3.8 3.9  CL 99   < > 108 110  CO2 31   < > 28 24  GLUCOSE 181*   < > 120* 129*  BUN 30*   < > 44* 47*  CREATININE 0.78   < > 1.05* 1.35*  CALCIUM 8.5*   < > 8.5* 8.2*  MG 2.6*  --   --   --   PHOS 2.8  --   --   --    < > = values in this interval not displayed.    Lipid Panel:  No results for input(s): CHOL, TRIG, HDL, CHOLHDL, VLDL, LDLCALC in the last 168 hours.  HgbA1c:  No results for input(s): HGBA1C in the last 168 hours.  Urine Drug Screen: No results for input(s): LABOPIA, COCAINSCRNUR, LABBENZ, AMPHETMU, THCU, LABBARB in the last 168 hours.  Alcohol Level  No results for input(s): ETH in the last 168 hours.   IMAGING past 24 hours DG Chest Portable 1 View  Result Date: 02/20/2021 CLINICAL DATA:  Tracheostomy. EXAM: PORTABLE CHEST 1 VIEW COMPARISON:  03/02/2021. FINDINGS: Interval extubation and tracheostomy placement.  Tracheostomy terminates approximately 1.9 cm above the carina. Heart is enlarged, stable. Left lower lobe collapse/consolidation. There may be mild right basilar interstitial prominence. IMPRESSION: 1. Interval tracheostomy. Tracheostomy is somewhat low lying, terminating proximally 1.9 cm above the carina. 2. Left lower lobe collapse/consolidation may be due to atelectasis. Difficult to exclude aspiration or pneumonia. Electronically Signed   By: Lorin Picket M.D.   On: 03/07/2021 16:08    PHYSICAL EXAM  Temp:  [100.9 F (38.3 C)-102.7 F (39.3 C)] 101.6 F (38.7 C) (01/17 1200) Pulse Rate:  [75-116] 116 (01/17 1000) Resp:  [13-33] 30 (01/17 1038) BP: (83-116)/(56-76) 116/74 (01/17 1000) SpO2:  [97 %-100 %] 98 % (01/17 1038) FiO2 (%):  [30 %] 30 % (01/17 1038) Weight:  [55.1 kg] 55.1 kg (01/17 0417)  General - [name] with tracheostomy in place  Cardiovascular - Regular rhythm but tachycardia.  Neuro - on ventilator off sedation, arousable but very lethargic, not following commands today.  Right gaze preference barely cross midline.  PERRL, does not blink to threat on left side.  Will move LUE and LLE to noxious stimuli.  Sensation, coordination and gait not tested.   ASSESSMENT/PLAN Taylor [name] is a 56 y.o. female with history of CHF, T2DM and OSA presenting with acute onset left-sided weakness, right gaze deviation and slurred speech. She was found to have occlusion of the distal basilar artery, right P1 and proximal right P2.  She was given TNK and received mechanical thrombectomy to the right and left PCA with stenting on the left. She was found to have two apical thrombi on her 2D echocardiogram and remains on IV heparin. Extubated and then emergently reintubated on 02/20/2021.  She weaned on pressure support 5/5 with an FiO2 30% on 1/8, however unable to wean in the morning on 1/9.  CCM and steroid and diuretic course completed 02/23/2021 for swollen  airway. WBC 18.0, respiratory, blood, and urine cultures sent. Vanco and cefepime started 02/24/2021. Tracheostomy and PEG have been placed, will likely discharge to LTAC.  Stroke:  embolic shower with b/l PCA occlusion s/p IR with TICI3 and left PCA stenting, likely secondary due to embolism from low EF and apical thrombi Code Stroke CT head No acute abnormality.  ASPECTS 10.    CTA head & neck nonopacification of distal basilar artery, right P1 and P2, poor opacification of right vertebral artery, focal stenosis in right A1 and A3 IR with b/l P1 occlusion s/p thrombectomy with TICI3 and left PCA stent MRI  Acute right PCA territory infarct, in right thalamus and occipital lobe, small acute infarcts in bilateral cerebellar hemispheres with scattered small acute infarcts in bilateral parietal lobes, left occipital and left frontal lobes, area of susceptibility infarct in right occipital lobe suggestive of petechial hemorrhage MRA thrombectomy and placement of left P1/P2 stent with good flow related signal in distal left PCA, poor flow signal in distal basilar tip, poor opacification of right vertebral artery, right A1 and A3 ACA stenosis 2D Echo global hypokinesis with inferior akinesis, EF 0000000, grade 3 diastolic dysfunction, thrombi in inferoapical and anteroapical areas, no atrial level shunt LDL 151 HgbA1c 11.9 VTE prophylaxis -Heparin IV aspirin 81 mg daily prior to admission, Now on heparin IV and Brilinta.  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 02/17/21 E Coli Sensitive to all antibiotics tested On cefepime -completed course Management per CCM on vanco and cefepime 02/24/2021 -> ancef  Hyperlipidemia Home meds:  atorvastatin 40 mg daily, resumed in hospital LDL 151, goal < 70 Increase to 80 mg atorvastatin daily  Continue statin at discharge  Diabetes type II Uncontrolled Home meds:  Jardiance 25 mg daily, levemir 17 units BID, metformin 1000 mg BID HgbA1c 11.9, goal < 7.0 CBGs Diabetes coordinator consult SSI- 0-20u q4  Other Stroke Risk Factors Former cigarette smoker Obstructive sleep apnea  Other Active Problems Hyperkalemia - 5.3 - > 5.0-> 4.2 (resolved) Dysphagia on tube feeding @ [facility] day # 21  Patient seen and examined by NP/APP with MD. MD to update note as needed.   [place] , MSN, AGACNP-BC [facility] See Amion for schedule and pager information 03/05/2021 12:47 PM  I have personally obtained history,examined this patient, reviewed notes, independently viewed imaging studies, participated in medical decision making and plan of care.ROS completed by me personally and pertinent positives fully documented  I have made any additions or clarifications directly to the above note. Agree with note above.  Patient had PEG tube yesterday.  We will wait 24 hours and change to Eliquis discontinue heparin.  She will need to stay on Brilinta and we will hopefully transfer to [facility] when medically stable later this week.  Wean off ventilatory support as per critical care team and management of fever and infection as per care.  No family available at the bedside for discussion.This patient is critically ill and at significant risk of neurological worsening, death  and care requires constant monitoring of vital signs, hemodynamics,respiratory and cardiac monitoring, extensive review of multiple databases, frequent neurological assessment, discussion with family, other specialists and medical decision making of high complexity.I have made any additions or clarifications directly to the above note.This critical care time does not reflect procedure time, or teaching time or supervisory time of PA/NP/Med Resident etc but could involve care discussion time.  I spent 30 minutes of neurocritical care time  in the care of  this patient.      Antony Contras, MD Medical Director [facility] Pager: [phone] 03/05/2021 1:23 PM  To contact Stroke Continuity provider, please refer to [email or web address] After hours, contact General Neurology

## 2021-03-05 NOTE — Progress Notes (Signed)
Nutrition Follow-up  DOCUMENTATION CODES:   Not applicable  INTERVENTION:   Tube feeding via PEG: Glucerna 1.5 at 45 ml/h (1080 ml per day) Prosource TF 45 ml daily  Provides 1660 kcal, 100 gm protein, 820 ml free water daily  MVI with minerals daily   200 ml free water every 8 hours  Total free water: 1420 ml    NUTRITION DIAGNOSIS:   Inadequate oral intake related to inability to eat as evidenced by NPO status. Ongoing.   GOAL:   Patient will meet greater than or equal to 90% of their needs Met with TF  MONITOR:   TF tolerance  REASON FOR ASSESSMENT:   Consult Enteral/tube feeding initiation and management  ASSESSMENT:   Pt with PMH of HF with EF 20-25%, uncontrolled DM, OSA on CPAP admitted with bil basilar stroke s/p TNK and IR.   Pt discussed during ICU rounds and with RN. Staff looking into LTACH options.   12/27 s/p TNK and IR 12/28 started TF 12/30 s/p cortrak placement; tip in distal stomach  1/2 given imodium  1/16 s/p PEG  Patient is currently intubated on ventilator support MV: 11.2 L/min Temp (24hrs), Avg:101.6 F (38.7 C), Min:100.9 F (38.3 C), Max:102.7 F (39.3 C)   Medications reviewed and include: nutrisource fiber TID, SSI, novolog, MVI with minerals daily, protonix LR @ 75 ml/hr   Labs reviewed: Na 147 A1C: 11.9 CBG's: 101-193    Diet Order:   Diet Order             Diet NPO time specified Except for: Other (See Comments)  Diet effective now                   EDUCATION NEEDS:   No education needs have been identified at this time  Skin:  Skin Assessment:  (skin tear perineum)  Last BM:  1/17 x 2; 100 ml via rectal tube  Height:   Ht Readings from Last 1 Encounters:  02/27/21 _0  (1.549 m)    Weight:   Wt Readings from Last 1 Encounters:  03/05/21 55.1 kg    Ideal Body Weight:     BMI:  Body mass index is 22.95 kg/m.  Estimated Nutritional Needs:   Kcal:  1600  Protein:  85-100  grams  Fluid:  > 1.6 L/day  Lockie Pares., RD, LDN, CNSC See AMiON for contact information

## 2021-03-05 NOTE — Progress Notes (Signed)
ANTICOAGULATION CONSULT NOTE Pharmacy Consult for Heparin Indication:  LV thrombus, s/p CVA  Brief A/P: Heparin level subtherapeutic Increase Heparin rate  No Known Allergies  Patient Measurements: Height: 5\' 1"  (154.9 cm) Weight: 55.1 kg (121 lb 7.6 oz) IBW/kg (Calculated) : 47.8 Heparin Dosing Weight: 60kg  Vital Signs: Temp: 101.6 F (38.7 C) (01/17 1200) Temp Source: Axillary (01/17 1200) BP: 116/74 (01/17 1000) Pulse Rate: 116 (01/17 1000)  Labs: Recent Labs    03/03/21 0224 03/03/21 1151 03/15/2021 0342 03/05/2021 0536 03/05/21 0108 03/05/21 0934  HGB 14.1  --  12.9  --  12.4   12.1  --   HCT 47.0*  --  44.0  --  41.7   41.0  --   PLT 243  --  204  --  202   223  --   HEPARINUNFRC 0.57 0.55  --   --  0.18* 0.43  CREATININE 0.94  --   --  1.05* 1.35*  --     Estimated Creatinine Clearance: 34.7 mL/min (A) (by C-G formula based on SCr of 1.35 mg/dL (H)).   Assessment: 56 yo female with LV thrombus and CVA s/p bronchoscopy 1/16 for heparin. S/p Trach/PEG placement completed 1/16. Heparin level therapeutic at 0.43 after rate increase this morning. CBC wnl. No active bleed issues reported.  Goal of Therapy:  Heparin level 0.3-0.5 units/ml Monitor platelets by anticoagulation protocol: Yes   Plan:  Continue heparin at 650 units/hr Monitor daily heparin level and CBC, s/sx bleeding F/u with Neuro for plan to transition to long-term anticoagulation now that PEG in place   2/16, PharmD, BCPS Please check AMION for all [facility] contact numbers Clinical Pharmacist 03/05/2021 12:26 PM

## 2021-03-05 NOTE — Progress Notes (Signed)
ANTICOAGULATION CONSULT NOTE Pharmacy Consult for Heparin Indication:  LV thrombus, s/p CVA  Brief A/P: Heparin level subtherapeutic Increase Heparin rate  No Known Allergies  Patient Measurements: Height: 5\' 1"  (154.9 cm) Weight: 60 kg (132 lb 4.4 oz) IBW/kg (Calculated) : 47.8 Heparin Dosing Weight: 60kg  Vital Signs: Temp: 101.3 F (38.5 C) (01/17 0000) Temp Source: Axillary (01/17 0000) BP: 91/62 (01/17 0200) Pulse Rate: 93 (01/17 0200)  Labs: Recent Labs    03/03/21 0224 03/03/21 1151 02/27/2021 0342 02/19/2021 0536 03/05/21 0108  HGB 14.1  --  12.9  --  12.1  HCT 47.0*  --  44.0  --  41.0  PLT 243  --  204  --  223  HEPARINUNFRC 0.57 0.55  --   --  0.18*  CREATININE 0.94  --   --  1.05* 1.35*    Estimated Creatinine Clearance: 38.3 mL/min (A) (by C-G formula based on SCr of 1.35 mg/dL (H)).   Assessment: 56 yo female with LV thrombus and CVA s/p bronchoscopy 1/16 for heparin  Goal of Therapy:  Heparin level 0.3-0.5 units/ml Monitor platelets by anticoagulation protocol: Yes   Plan:  Increase Heparin 650 units/hr Check heparin level in 8 hours.  2/16, PharmD, BCPS  03/05/2021

## 2021-03-05 NOTE — Progress Notes (Signed)
Occupational Therapy Treatment Patient Details Name: Taylor [name] MRN: [id number] DOB: 04/22/1965 Today's Date: 03/05/2021   History of present illness 56 y/o female presented to ED on 12/27 for L sided paralysis, R gaze, slurred speech, and L facial droop. TNK given. S/p mechanical thrombectomy of bilateral occluded P1/PCA. MRI showed acute R PCA infarcts including infarcts in the R thalamus and R occipital lobe with mild mass effect with partial effacement of 3rd ventricle, numerous small acute infarcts in bilateral cerebellar hemispheres and few scattered small acute infarcts in bilateral parietal lobes, L occipital and L frontal lobes. Intubated 12/27. PMH: CHF, sleep apnea, type 2 DM   OT comments  Pt continues to present with decreased balance, strength, coordination, cognition, and activity tolerance. Pt opening eyes during conversation. Pt requiring Total A +2 for bed mobility with sitting at EOB with Max A. Tendency for lateral lean to left while EOB. Facilitating PROM of BUEs, cervical ROM, scapular retraction, and R lateral leaning. Noting colored secretions from nose and mouth throughout session; notified RN. Continue to recommend post acute facility and will continue to follow.    Recommendations for follow up therapy are one component of a multi-disciplinary discharge planning process, led by the attending physician.  Recommendations may be updated based on patient status, additional functional criteria and insurance authorization.    Follow Up Recommendations  OT at Long-term acute care hospital    Assistance Recommended at Discharge Frequent or constant Supervision/Assistance  Patient can return home with the following      Equipment Recommendations  Tub/shower seat;Wheelchair cushion (measurements OT);Wheelchair (measurements OT)    Recommendations for Other Services      Precautions / Restrictions Precautions Precautions: Fall Precaution Comments: trach, PEG, rectal  pouch Restrictions Weight Bearing Restrictions: No       Mobility Bed Mobility Overal bed mobility: Needs Assistance Bed Mobility: Supine to Sit, Sit to Supine Rolling: Total assist, +2 for physical assistance   Supine to sit: Total assist, +2 for physical assistance Sit to supine: Total assist, +2 for physical assistance   General bed mobility comments: pt shows lack of participation but difficult to discern if it is due to low motivation vs decreased cogntion vs decreased activity tolerance. Pt tot A +2 to come to EOB and return to supine. Pt made eye contact when cued during transitions    Transfers                   General transfer comment: unable     Balance Overall balance assessment: Needs assistance Sitting-balance support: Feet supported, No upper extremity supported Sitting balance-Leahy Scale: Poor Sitting balance - Comments: patient unable to to maintain sit balance without external support. L lean in sitting. Worked on [employer] through L hand as well as WB'ing through R elbow with R lean. Pt maintained sitting >10 mins with posterior support. Worked on upright posture, cervical extension, chest expansion. Postural control: Left lateral lean, Posterior lean Standing balance support: No upper extremity supported Standing balance-Leahy Scale: Poor Standing balance comment: NT                           ADL either performed or assessed with clinical judgement   ADL Overall ADL's : Needs assistance/impaired     Grooming: Maximal assistance Grooming Details (indicate cue type and reason): Wipe face  General ADL Comments: Continues to present with decreased balance, strength, cognition, arousal, and coordination.    Extremity/Trunk Assessment Upper Extremity Assessment Upper Extremity Assessment: LUE deficits/detail RUE Deficits / Details: No active movement. WFL PROM. RUE Coordination: decreased fine  motor;decreased gross motor LUE Deficits / Details: No active movement. WFL PROM. LUE Coordination: decreased fine motor;decreased gross motor   Lower Extremity Assessment Lower Extremity Assessment: Defer to PT evaluation        Vision   Alignment/Gaze Preference: Gaze left Tracking/Visual Pursuits: Decreased smoothness of horizontal tracking;Decreased smoothness of vertical tracking   Perception     Praxis      Cognition Arousal/Alertness: Awake/alert Behavior During Therapy: Flat affect Overall Cognitive Status: Difficult to assess                                 General Comments: following commands intermittently with delay and max encouragement. Blinked on command        Exercises Exercises: General Upper Extremity, Other exercises General Exercises - Upper Extremity Shoulder Flexion: PROM, Both, 10 reps, Seated Other Exercises Other Exercises: cervical ROM while seated at EOB; requiring Min-Max A for positioning Other Exercises: lateral leaning onto RUE at EOB. x3. Other Exercises: scapula retraction; EOB; Max A; x10    Shoulder Instructions       General Comments VSS on trach. increased colored secretions through nose and mouth.    Pertinent Vitals/ Pain       Pain Assessment Pain Assessment: Faces Faces Pain Scale: No hurt Pain Location: generalized with movement Pain Descriptors / Indicators: Grimacing Pain Intervention(s): Monitored during session, Limited activity within patient's tolerance, Repositioned  Home Living                                          Prior Functioning/Environment              Frequency  Min 2X/week        Progress Toward Goals  OT Goals(current goals can now be found in the care plan section)  Progress towards OT goals: Not progressing toward goals - comment  Acute Rehab OT Goals Patient Stated Goal: Not stated OT Goal Formulation: Patient unable to participate in goal  setting Time For Goal Achievement: 03/14/21 Potential to Achieve Goals: Fair ADL Goals Pt Will Perform Grooming: with min assist;sitting Pt Will Perform Upper Body Bathing: with mod assist;sitting Pt Will Perform Upper Body Dressing: with mod assist;sitting Additional ADL Goal #1: Tolerate sitting edge of bed for up to 5 min with min A for balance to increase upper body ADL independence.  Plan Discharge plan needs to be updated    Co-evaluation    PT/OT/SLP Co-Evaluation/Treatment: Yes Reason for Co-Treatment: To address functional/ADL transfers;For patient/therapist safety PT goals addressed during session: Balance;Mobility/safety with mobility OT goals addressed during session: ADL's and self-care      AM-PAC OT "6 Clicks" Daily Activity     Outcome Measure   Help from another person eating meals?: Total Help from another person taking care of personal grooming?: Total Help from another person toileting, which includes using toliet, bedpan, or urinal?: Total Help from another person bathing (including washing, rinsing, drying)?: Total Help from another person to put on and taking off regular upper body clothing?: Total Help from another person to put on and taking off  regular lower body clothing?: Total 6 Click Score: 6    End of Session Equipment Utilized During Treatment: Oxygen  OT Visit Diagnosis: Muscle weakness (generalized) (M62.81);Other symptoms and signs involving cognitive function;Hemiplegia and hemiparesis Hemiplegia - Right/Left: Left Hemiplegia - dominant/non-dominant: Non-Dominant Hemiplegia - caused by: Cerebral infarction   Activity Tolerance Treatment limited secondary to medical complications (Comment)   Patient Left in bed;with call bell/phone within reach;with bed alarm set   Nurse Communication Mobility status        Time: 6720-9470 OT Time Calculation (min): 24 min  Charges: OT General Charges $OT Visit: 1 Visit OT  Treatments $Therapeutic Activity: 8-22 mins  Kadar Chance MSOT, OTR/L Acute Rehab Pager: [phone] Office: [phone]  Theodoro Grist Kin Galbraith 03/05/2021, 5:50 PM

## 2021-03-05 NOTE — Progress Notes (Signed)
Patient ID: Taylor [name], female   DOB: 1965-11-05, 56 y.o.   MRN: [id number] Abdomen soft, PEG in place, binder on. Continue binder loosely at all times to protect PEG. Re-call Trauma PRN.  Violeta Gelinas, MD, MPH, FACS Please use [email or web address] to contact on call provider

## 2021-03-05 NOTE — Progress Notes (Signed)
Physical Therapy Treatment Patient Details Name: Taylor [name] MRN: [id number] DOB: [birth year] Today's Date: 03/05/2021   History of Present Illness 56 y/o female presented to ED on 12/27 for L sided paralysis, R gaze, slurred speech, and L facial droop. TNK given. S/p mechanical thrombectomy of bilateral occluded P1/PCA. MRI showed acute R PCA infarcts including infarcts in the R thalamus and R occipital lobe with mild mass effect with partial effacement of 3rd ventricle, numerous small acute infarcts in bilateral cerebellar hemispheres and few scattered small acute infarcts in bilateral parietal lobes, L occipital and L frontal lobes. Intubated 12/27. PMH: CHF, sleep apnea, type 2 DM    PT Comments    Pt opens eyes to conversation as well as blinking on command. Pt with colored secretions through nose andf mouth. Pt sat EOB with tot A +2 with L lateral lean. WOrked with OT and PT on sitting balance, upright posture, head alignment, and sustained attention. Pt appeared fatigue at end of session. PT will continue to follow.    Recommendations for follow up therapy are one component of a multi-disciplinary discharge planning process, led by the attending physician.  Recommendations may be updated based on patient status, additional functional criteria and insurance authorization.  Follow Up Recommendations  PT at Long-term acute care hospital     Assistance Recommended at Discharge Frequent or constant Supervision/Assistance  Patient can return home with the following Two people to help with bathing/dressing/bathroom;Two people to help with walking and/or transfers;Assistance with cooking/housework;Assistance with feeding;Direct supervision/assist for medications management;Direct supervision/assist for financial management;Assist for transportation;Help with stairs or ramp for entrance   Equipment Recommendations  Other (comment) (TBD)    Recommendations for Other Services Rehab consult      Precautions / Restrictions Precautions Precautions: Fall Precaution Comments: trach, PEG, rectal pouch Restrictions Weight Bearing Restrictions: No     Mobility  Bed Mobility Overal bed mobility: Needs Assistance Bed Mobility: Supine to Sit, Sit to Supine Rolling: Total assist, +2 for physical assistance   Supine to sit: Total assist, +2 for physical assistance Sit to supine: Total assist, +2 for physical assistance   General bed mobility comments: pt shows lack of participation but difficult to discern if it is due to low motivation vs decreased cogntion vs decreased activity tolerance. Pt tot A +2 to come to EOB and return to supine. Pt made eye contact when cued during transitions    Transfers                   General transfer comment: unable    Ambulation/Gait               General Gait Details: unable at this time   Stairs             Wheelchair Mobility    Modified Rankin (Stroke Patients Only) Modified Rankin (Stroke Patients Only) Pre-Morbid Rankin Score: No significant disability Modified Rankin: Severe disability     Balance Overall balance assessment: Needs assistance Sitting-balance support: Feet supported, No upper extremity supported Sitting balance-Leahy Scale: Poor Sitting balance - Comments: patient unable to to maintain sit balance without external support. L lean in sitting. Worked on [employer] through L hand as well as [place] through R elbow with R lean. Pt maintained sitting >10 mins with posterior support. Worked on upright posture, cervical extension, chest expansion. Postural control: Left lateral lean, Posterior lean Standing balance support: No upper extremity supported Standing balance-Leahy Scale: Poor Standing balance comment: NT  Cognition Arousal/Alertness: Awake/alert Behavior During Therapy: Flat affect Overall Cognitive Status: Difficult to assess                                  General Comments: following commands intermittently with delay and max encouragement. Blinked on command        Exercises General Exercises - Upper Extremity Shoulder Flexion: PROM, Both, 10 reps, Seated    General Comments General comments (skin integrity, edema, etc.): VSS      Pertinent Vitals/Pain Pain Assessment Pain Assessment: Faces Faces Pain Scale: No hurt    Home Living                          Prior Function            PT Goals (current goals can now be found in the care plan section) Acute Rehab PT Goals Patient Stated Goal: unable to state PT Goal Formulation: Patient unable to participate in goal setting Time For Goal Achievement: 02/28/21 Potential to Achieve Goals: Fair Progress towards PT goals: Not progressing toward goals - comment    Frequency    Min 3X/week      PT Plan Discharge plan needs to be updated    Co-evaluation PT/OT/SLP Co-Evaluation/Treatment: Yes Reason for Co-Treatment: Complexity of the patient's impairments (multi-system involvement);Necessary to address cognition/behavior during functional activity;For patient/therapist safety PT goals addressed during session: Balance;Mobility/safety with mobility        AM-PAC PT "6 Clicks" Mobility   Outcome Measure  Help needed turning from your back to your side while in a flat bed without using bedrails?: Total Help needed moving from lying on your back to sitting on the side of a flat bed without using bedrails?: Total Help needed moving to and from a bed to a chair (including a wheelchair)?: Total Help needed standing up from a chair using your arms (e.g., wheelchair or bedside chair)?: Total Help needed to walk in hospital room?: Total Help needed climbing 3-5 steps with a railing? : Total 6 Click Score: 6    End of Session Equipment Utilized During Treatment: Oxygen Activity Tolerance: Treatment limited secondary to medical complications  (Comment) Patient left: in bed;with call bell/phone within reach Nurse Communication: Mobility status PT Visit Diagnosis: Unsteadiness on feet (R26.81);Muscle weakness (generalized) (M62.81);Difficulty in walking, not elsewhere classified (R26.2);Other symptoms and signs involving the nervous system (R29.898)     Time: [id number] PT Time Calculation (min) (ACUTE ONLY): 24 min  Charges:  $Therapeutic Activity: 8-22 mins                     [place]  Pager [phone] Office [place] 03/05/2021, 3:42 PM

## 2021-03-05 NOTE — TOC Progression Note (Signed)
Transition of Care [facility]) - Progression Note    Patient Details  Name: Taylor [name] MRN: [id number] Date of Birth: 1965-08-30  Transition of Care [facility]) CM/SW Contact  Leone Haven, RN Phone Number: 03/05/2021, 12:47 PM  Clinical Narrative:    NCM spoke with daughter Edmund Hilda, offered choice for [facility],  she states she and her brother would like for patient to go to the [facility].  NCM informed Victorino Dike with [facility].     Expected Discharge Plan: Long Term Acute Care (LTAC) Barriers to Discharge: Continued Medical Work up  Expected Discharge Plan and Services Expected Discharge Plan: Long Term Acute Care (LTAC)   Discharge Planning Services: CM Consult   Living arrangements for the past 2 months: Single Family Home                                       Social Determinants of Health (SDOH) Interventions    Readmission Risk Interventions No flowsheet data found.

## 2021-03-06 ENCOUNTER — Inpatient Hospital Stay (HOSPITAL_COMMUNITY): Payer: 59

## 2021-03-06 LAB — BASIC METABOLIC PANEL
Anion gap: 13 (ref 5–15)
BUN: 54 mg/dL — ABNORMAL HIGH (ref 6–20)
CO2: 24 mmol/L (ref 22–32)
Calcium: 8 mg/dL — ABNORMAL LOW (ref 8.9–10.3)
Chloride: 109 mmol/L (ref 98–111)
Creatinine, Ser: 1.56 mg/dL — ABNORMAL HIGH (ref 0.44–1.00)
GFR, Estimated: 39 mL/min — ABNORMAL LOW (ref >60)
Glucose, Bld: 192 mg/dL — ABNORMAL HIGH (ref 70–99)
Potassium: 4 mmol/L (ref 3.5–5.1)
Sodium: 146 mmol/L — ABNORMAL HIGH (ref 135–145)

## 2021-03-06 LAB — CBC
HCT: 40.2 % (ref 36.0–46.0)
Hemoglobin: 11.7 g/dL — ABNORMAL LOW (ref 12.0–15.0)
MCH: 28.1 pg (ref 26.0–34.0)
MCHC: 29.1 g/dL — ABNORMAL LOW (ref 30.0–36.0)
MCV: 96.6 fL (ref 80.0–100.0)
Platelets: 205 10*3/uL (ref 150–400)
RBC: 4.16 MIL/uL (ref 3.87–5.11)
RDW: 18.5 % — ABNORMAL HIGH (ref 11.5–15.5)
WBC: 13.8 10*3/uL — ABNORMAL HIGH (ref 4.0–10.5)
nRBC: 0.1 % (ref 0.0–0.2)

## 2021-03-06 LAB — GLUCOSE, CAPILLARY
Glucose-Capillary: 120 mg/dL — ABNORMAL HIGH (ref 70–99)
Glucose-Capillary: 143 mg/dL — ABNORMAL HIGH (ref 70–99)
Glucose-Capillary: 144 mg/dL — ABNORMAL HIGH (ref 70–99)
Glucose-Capillary: 171 mg/dL — ABNORMAL HIGH (ref 70–99)
Glucose-Capillary: 86 mg/dL (ref 70–99)
Glucose-Capillary: 98 mg/dL (ref 70–99)

## 2021-03-06 LAB — HEPARIN LEVEL (UNFRACTIONATED): Heparin Unfractionated: 0.32 IU/mL (ref 0.30–0.70)

## 2021-03-06 MED ORDER — IOHEXOL 9 MG/ML PO SOLN
500.0000 mL | ORAL | Status: AC
  Administered 2021-03-06: 500 mL via ORAL

## 2021-03-06 MED ORDER — FREE WATER
200.0000 mL | Status: DC
  Administered 2021-03-06 – 2021-03-07 (×5): 200 mL

## 2021-03-06 MED ORDER — IOHEXOL 9 MG/ML PO SOLN
ORAL | Status: AC
  Administered 2021-03-06: 500 mL via ORAL
  Filled 2021-03-06: qty 1000

## 2021-03-06 MED ORDER — APIXABAN 5 MG PO TABS
5.0000 mg | ORAL_TABLET | Freq: Two times a day (BID) | ORAL | Status: DC
  Administered 2021-03-06 – 2021-03-09 (×7): 5 mg
  Filled 2021-03-06 (×7): qty 1

## 2021-03-06 NOTE — Progress Notes (Signed)
Pt. Transported on vent to CT and returned to [id number] with no complications.

## 2021-03-06 NOTE — Progress Notes (Signed)
IV heparin gtt stopped after 1st dose of eliquis given this AM.   Sherral Hammers, RN

## 2021-03-06 NOTE — Progress Notes (Addendum)
ANTICOAGULATION CONSULT NOTE  Pharmacy Consult for Heparin Indication:  LV thrombus, s/p CVA   No Known Allergies  Patient Measurements: Height: 5\' 1"  (154.9 cm) Weight: 55.1 kg (121 lb 7.6 oz) IBW/kg (Calculated) : 47.8 Heparin Dosing Weight: 60kg  Vital Signs: Temp: 102.1 F (38.9 C) (01/18 0800) Temp Source: Axillary (01/18 0800) BP: 100/71 (01/18 0800) Pulse Rate: 113 (01/18 0800)  Labs: Recent Labs    03/16/2021 0342 02/27/2021 0536 03/05/21 0108 03/05/21 0934 03/06/21 0343 03/06/21 0826  HGB 12.9  --  12.4   12.1  --   --  11.7*  HCT 44.0  --  41.7   41.0  --   --  40.2  PLT 204  --  202   223  --   --  205  HEPARINUNFRC  --   --  0.18* 0.43 0.32  --   CREATININE  --  1.05* 1.35*  --   --   --     Estimated Creatinine Clearance: 34.7 mL/min (A) (by C-G formula based on SCr of 1.35 mg/dL (H)).   Assessment: 56 yo female with LV thrombus and CVA s/p bronchoscopy 1/16 for heparin. S/p Trach/PEG placement completed 1/16.  Heparin level therapeutic at 0.32 units/mL on 650 units/hr.  Now to transition to Eliquis per CCM.  Will not load Eliquis as patient has been on IV heparin for ~3 weeks.  CBC stable; minimal vaginal bleeding per RN.  Goal of Therapy:  Heparin level 0.3-0.5 units/ml Monitor platelets by anticoagulation protocol: Yes   Plan:  Eliquis 5mg  PO BID - stop IV heparin when first dose of Eliquis is administered Pharmacy will follow peripherally  Roddrick Sharron D. Mina Marble, PharmD, BCPS, [place] 03/06/2021, 8:48 AM

## 2021-03-06 NOTE — Progress Notes (Signed)
NAME:  VASTI KAVENEY, MRN:  [id number], DOB:  08/09/1965, LOS: 43 ADMISSION DATE:  01/26/2021, CONSULTATION DATE:  12/27 REFERRING MD:  Dr Tennis Must Sindy Messing , CHIEF COMPLAINT:   CVA  History of Present Illness:  56 year old female with PMH as below, which is significant for HFrEF with LVEF 20-25 % (2017), DM, and OSA on CPAP. 12/27 she presented to [facility] ED with complaints of acute onset left sided weakness and slurred speech. EMS noted non-verbal and right gaze preference. Imaging consistent with occlusion of the distal basilar arterty, right P1, and proximal R P2. She was given systemic TNK and mechanical thrombectomy  with stent retriever on the right with complete recanalization after one pass (TICI3). PCCM consulted for vent management and ICU care.    Pertinent  Medical History   has a past medical history of CHF (congestive heart failure) ([place]), Diabetes mellitus without complication ([place]), and Obstructive sleep apnea.   Significant Hospital Events: Including procedures, antibiotic start and stop dates in addition to other pertinent events   12/27 admit for posterior circulation stroke. TNK given and IR throbmectomy 12/28 echo with LV thrombus, EF 20-25%.  Started on heparin 12/30 failing weaning trials.  Started on cefepime for positive UA 1/1 On PSV weans 1/4: patient extubated and developed stridor and increase wob; required reintubation; decadron and lasix given 1/6 restarted Decadron since there is no cuff leak.  Lasix dose increased 1/9 Still waiting on family to decide on tracheostomy  1/13 Diuresing, urinary retention foley placed 1/14 diuresis held, febrile, tachycardic, placed on cefepime 1/15 febrile still, no source of infection identified 1/16 creatinine creeping up suspect hypovolemia, increased losses with fever, cefepime stopped 1/17 Cr rising, fluids added, fevers continue, weight down 8 kg from admission 1/18 Cr rising, continue fluids, UOP better with  fluids  Interim History / Subjective:   Febrile still. Cr rising, WUOP better with fluids  Objective   Blood pressure 111/74, pulse (!) 113, temperature (!) 102.1 F (38.9 C), temperature source Axillary, resp. rate (!) 29, height 5\' 1"  (1.549 m), weight 55.1 kg, SpO2 98 %.    Vent Mode: PRVC FiO2 (%):  [30 %] 30 % Set Rate:  [18 bmp] 18 bmp Vt Set:  [380 mL] 380 mL PEEP:  [5 cmH20] 5 cmH20 Plateau Pressure:  [17 cmH20-20 cmH20] 19 cmH20   Intake/Output Summary (Last 24 hours) at 03/06/2021 0954 Last data filed at 03/06/2021 [id number] Gross per 24 hour  Intake 3204.53 ml  Output 1855 ml  Net 1349.53 ml    Filed Weights   02/26/21 0500 02/28/21 0500 03/05/21 0417  Weight: 60.2 kg 60 kg 55.1 kg    Examination: Gen:      No acute distress, frail, appears malnourished.  HEENT:  EOMI, sclera anicteric Neck:     No masses; no thyromegaly ET tube Lungs:    Clear to auscultation bilaterally; normal respiratory effort CV:         Regular rate and rhythm; no murmurs Abd:      + bowel sounds; soft, non-tender; no palpable masses, no distension Ext:    No edema; adequate peripheral perfusion Skin:      Warm and dry; no rash Neuro:   Somnolent, not following commands for me    Assessment & Plan:   CVA: basilar, bilateral PI/PCA. TNK given in ED and she is s/p mechanical thrombectomy on the right and mechanical thrombectomy with stent placement on the left.  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 coli UTI, MSSA pneumonia: completed course abx, fever started while on cefazolin for this.  Type 2 DM --2u q4 hrs TF  coverage, SSI  Fever: on 1/13, cultured 1/14 NGTD, UA clear, CXR clear, cefepime x 48 hrs without defervescence. On AC< DVT felt unlikely. Ongoing fever.  Developing AKI: suspect pre -renal with fever and insensible losses, preceding diuresis. UOP picked up with fluids 1/17, Cr rising still. --Continue fluids  Best Practice (right click and "Reselect all SmartList Selections" daily)   Diet/type: tubefeeds DVT prophylaxis: systemic heparin GI prophylaxis: PPI Lines: N/A Foley:  Yes, and it is still needed Code Status:  full code Last date of multidisciplinary goals of care discussion [n/a]  Critical care time:   CRITICAL CARE Performed by: Lanier Clam   Total critical care time: 33 minutes  Critical care time was exclusive of separately billable procedures and treating other patients.  Critical care was necessary to treat or prevent imminent or life-threatening deterioration.  Critical care was time spent personally by me on the following activities: development of treatment plan with patient and/or surrogate as well as nursing, discussions with consultants, evaluation of patient's response to treatment, examination of patient, obtaining history from patient or surrogate, ordering and performing treatments and interventions, ordering and review of laboratory studies, ordering and review of radiographic studies, pulse oximetry and re-evaluation of patient's condition.   Lanier Clam, MD See Amion for contact info  03/06/2021, 9:54 AM

## 2021-03-06 NOTE — Progress Notes (Signed)
Called CT after giving second 500 mL bottle of contrast through patient's PEG tube as instructed. CT will call back to schedule a time for patient's abdominal/pelvic scan.  Montez Hageman, RN

## 2021-03-06 NOTE — Progress Notes (Addendum)
STROKE TEAM PROGRESS NOTE   INTERVAL HISTORY:  Patient is seen in her room with no family at the bedside.  She continues to be febrile and has been hypotensive at times.  Tracheostomy and PEG tube have been placed.  Will transition from heparin to Eliquis today.  She remains tachycardic but hemodynamically stable and remains febrile (Tm 102.7).  Serum creatinine is rising.  PEG to site looks good.  Abdomen is soft with binder on to protect PEG tube  Vitals:   03/06/21 1000 03/06/21 1100 03/06/21 1109 03/06/21 1200  BP: 103/66 98/70  101/69  Pulse: (!) 114 (!) 113 (!) 114 (!) 118  Resp: 19 (!) 29 (!) 31 (!) 34  Temp:    (!) 102.7 F (39.3 C)  TempSrc:    Axillary  SpO2: 99% 98% 96% 98%  Weight:      Height:       CBC:  Recent Labs  Lab 03/05/21 0108 03/06/21 0826  WBC 14.4*   14.3* 13.8*  NEUTROABS 11.7*  --   HGB 12.4   12.1 11.7*  HCT 41.7   41.0 40.2  MCV 97.4   97.2 96.6  PLT 202   223 [id number]    Basic Metabolic Panel:  Recent Labs  Lab 03/05/21 0108 03/06/21 0826  NA 147* 146*  K 3.9 4.0  CL 110 109  CO2 24 24  GLUCOSE 129* 192*  BUN 47* 54*  CREATININE 1.35* 1.56*  CALCIUM 8.2* 8.0*    Lipid Panel:  No results for input(s): CHOL, TRIG, HDL, CHOLHDL, VLDL, LDLCALC in the last 168 hours.  HgbA1c:  No results for input(s): HGBA1C in the last 168 hours.  Urine Drug Screen: No results for input(s): LABOPIA, COCAINSCRNUR, LABBENZ, AMPHETMU, THCU, LABBARB in the last 168 hours.  Alcohol Level  No results for input(s): ETH in the last 168 hours.   IMAGING past 24 hours No results found.  PHYSICAL EXAM  Temp:  [99.4 F (37.4 C)-102.7 F (39.3 C)] 102.7 F (39.3 C) (01/18 1200) Pulse Rate:  [94-118] 118 (01/18 1200) Resp:  [0-34] 34 (01/18 1200) BP: (84-111)/(61-77) 101/69 (01/18 1200) SpO2:  [96 %-99 %] 98 % (01/18 1200) FiO2 (%):  [30 %] 30 % (01/18 1200)  General - [name] with tracheostomy in place  Cardiovascular - Regular  rhythm but tachycardia.  Neuro - on ventilator off sedation, arousable but very lethargic, not following commands today.   Right gaze preference barely cross midline.  PERRL, does not blink to threat on left side.  Will move LUE and LLE to noxious stimuli.  Sensation, coordination and gait not tested.   ASSESSMENT/PLAN Ms. HAIVEN MAHON is a 56 y.o. female with history of CHF, T2DM and OSA presenting with acute onset left-sided weakness, right gaze deviation and slurred speech. She was found to have occlusion of the distal basilar artery, right P1 and proximal right P2.  She was given TNK and received mechanical thrombectomy to the right and left PCA with stenting on the left. She was found to have two apical thrombi on her 2D echocardiogram and remains on IV heparin. Extubated and then emergently reintubated on 02/20/2021.  She weaned on pressure support 5/5 with an FiO2 30% on 1/8, however unable to wean in the morning on 1/9.  CCM and steroid and diuretic course completed 02/23/2021 for swollen airway. WBC 13.8, respiratory, blood, and urine cultures sent. Vanco and cefepime started 02/24/2021, febrile despite abx. Tracheostomy and PEG have been placed,  will likely discharge to [place].  Stroke:  embolic shower with b/l PCA occlusion s/p IR with TICI3 and left PCA stenting, likely secondary due to embolism from low EF and apical thrombi Code Stroke CT head No acute abnormality.  ASPECTS 10.    CTA head & neck nonopacification of distal basilar artery, right P1 and P2, poor opacification of right vertebral artery, focal stenosis in right A1 and A3 IR with b/l P1 occlusion s/p thrombectomy with TICI3 and left PCA stent MRI  Acute right PCA territory infarct, in right thalamus and occipital lobe, small acute infarcts in bilateral cerebellar hemispheres with scattered small acute infarcts in bilateral parietal lobes, left occipital and left frontal lobes, area of susceptibility infarct in right occipital lobe  suggestive of petechial hemorrhage MRA thrombectomy and placement of left P1/P2 stent with good flow related signal in distal left PCA, poor flow signal in distal basilar tip, poor opacification of right vertebral artery, right A1 and A3 ACA stenosis 2D Echo global hypokinesis with inferior akinesis, EF 0000000, grade 3 diastolic dysfunction, thrombi in inferoapical and anteroapical areas, no atrial level shunt LDL 151 HgbA1c 11.9 VTE prophylaxis -Heparin IV aspirin 81 mg daily prior to admission, heparin IV and Brilinta d/c'd.  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 02/17/21 E Coli Sensitive to all antibiotics tested On cefepime -completed course Management per CCM on vanco and cefepime 02/24/2021 -> ancef  Hyperlipidemia Home meds:  atorvastatin 40 mg daily, resumed in hospital LDL 151, goal < 70 Increase to 80 mg atorvastatin daily  Continue statin at discharge  Diabetes type II Uncontrolled Home meds:  Jardiance 25 mg daily, levemir 17 units BID, metformin 1000 mg BID HgbA1c 11.9, goal < 7.0 CBGs Diabetes coordinator consult SSI- 0-20u q4  Other Stroke Risk Factors Former cigarette smoker Obstructive sleep apnea  Other Active Problems Hyperkalemia - 5.3 - > 5.0-> 4.2  (resolved) Dysphagia on tube feeding @ [facility] day # 22  Patient seen and examined by NP/APP with MD. MD to update note as needed.   Janine Ores, DNP, FNP-BC [facility] Pager: [phone] I have personally obtained history,examined this patient, reviewed notes, independently viewed imaging studies, participated in medical decision making and plan of care.ROS completed by me personally and pertinent positives fully documented  I have made any additions or clarifications directly to the above note. Agree with note above.  Patient continues to be sick with persistent fever and tachycardia despite being on antibiotics.  Change IV heparin to Eliquis.  Patient will transfer to medical hospitalist service when she leaves the ICU after discussion with critical care.  No family at the bedside.  Discussed with Dr. Silas Flood critical care medicine.This patient is critically ill and at significant risk of neurological worsening, death and care requires constant monitoring of vital signs, hemodynamics,respiratory and cardiac monitoring, extensive review of  multiple databases, frequent neurological assessment, discussion with family, other specialists and medical decision making of high complexity.I have made any additions or clarifications directly to the above note.This critical care time does not reflect procedure time, or teaching time or supervisory time of PA/NP/Med Resident etc but could involve care discussion time.  I spent 35 minutes of neurocritical care time  in the care of  this patient.      Antony Contras, MD Medical Director [facility] Pager: [phone] 03/06/2021 3:19 PM  After hours, contact General Neurology

## 2021-03-07 LAB — CULTURE, BLOOD (ROUTINE X 2)
Culture: NO GROWTH
Culture: NO GROWTH

## 2021-03-07 LAB — BASIC METABOLIC PANEL
Anion gap: 12 (ref 5–15)
BUN: 54 mg/dL — ABNORMAL HIGH (ref 6–20)
CO2: 23 mmol/L (ref 22–32)
Calcium: 7.7 mg/dL — ABNORMAL LOW (ref 8.9–10.3)
Chloride: 111 mmol/L (ref 98–111)
Creatinine, Ser: 1.54 mg/dL — ABNORMAL HIGH (ref 0.44–1.00)
GFR, Estimated: 39 mL/min — ABNORMAL LOW (ref >60)
Glucose, Bld: 137 mg/dL — ABNORMAL HIGH (ref 70–99)
Potassium: 4.3 mmol/L (ref 3.5–5.1)
Sodium: 146 mmol/L — ABNORMAL HIGH (ref 135–145)

## 2021-03-07 LAB — CBC
HCT: 37.6 % (ref 36.0–46.0)
Hemoglobin: 11.1 g/dL — ABNORMAL LOW (ref 12.0–15.0)
MCH: 28.2 pg (ref 26.0–34.0)
MCHC: 29.5 g/dL — ABNORMAL LOW (ref 30.0–36.0)
MCV: 95.4 fL (ref 80.0–100.0)
Platelets: 182 10*3/uL (ref 150–400)
RBC: 3.94 MIL/uL (ref 3.87–5.11)
RDW: 18.4 % — ABNORMAL HIGH (ref 11.5–15.5)
WBC: 14.5 10*3/uL — ABNORMAL HIGH (ref 4.0–10.5)
nRBC: 0.1 % (ref 0.0–0.2)

## 2021-03-07 LAB — GLUCOSE, CAPILLARY
Glucose-Capillary: 135 mg/dL — ABNORMAL HIGH (ref 70–99)
Glucose-Capillary: 116 mg/dL — ABNORMAL HIGH (ref 70–99)
Glucose-Capillary: 199 mg/dL — ABNORMAL HIGH (ref 70–99)
Glucose-Capillary: 133 mg/dL — ABNORMAL HIGH (ref 70–99)
Glucose-Capillary: 91 mg/dL (ref 70–99)
Glucose-Capillary: 121 mg/dL — ABNORMAL HIGH (ref 70–99)

## 2021-03-07 MED ORDER — FREE WATER
200.0000 mL | Status: DC
  Administered 2021-03-07 – 2021-03-09 (×30): 200 mL

## 2021-03-07 NOTE — Progress Notes (Addendum)
STROKE TEAM PROGRESS NOTE   INTERVAL HISTORY:  Patient is seen in her room with no family at the bedside.  She continues to be febrile and has been hypotensive at times.  Tracheostomy and PEG tube have been placed.  Plan for tracheostomy trial during the day and rest with ventilator at night.  Transition from from heparin to Eliquis yesterday.  She remains tachycardic but hemodynamically stable and remains febrile (Tm 101.5).  White count remains elevated at 14.5 serum creatinine is rising.  PEG to site looks good.  Neurological exam remains unchanged with patient arousable and follows only occasional commands.  Vitals:   03/07/21 0820 03/07/21 0900 03/07/21 1000 03/07/21 1150  BP:  115/79 120/77   Pulse:  (!) 112 (!) 115   Resp:  (!) 21 (!) 30   Temp:      TempSrc:      SpO2: 97% 97% 97% 97%  Weight:      Height:       CBC:  Recent Labs  Lab 03/05/21 0108 03/06/21 0826 03/07/21 0325  WBC 14.4*   14.3* 13.8* 14.5*  NEUTROABS 11.7*  --   --   HGB 12.4   12.1 11.7* 11.1*  HCT 41.7   41.0 40.2 37.6  MCV 97.4   97.2 96.6 95.4  PLT 202   223 205 [id number]    Basic Metabolic Panel:  Recent Labs  Lab 03/06/21 0826 03/07/21 0325  NA 146* 146*  K 4.0 4.3  CL 109 111  CO2 24 23  GLUCOSE 192* 137*  BUN 54* 54*  CREATININE 1.56* 1.54*  CALCIUM 8.0* 7.7*    Lipid Panel:  No results for input(s): CHOL, TRIG, HDL, CHOLHDL, VLDL, LDLCALC in the last 168 hours.  HgbA1c:  No results for input(s): HGBA1C in the last 168 hours.  Urine Drug Screen: No results for input(s): LABOPIA, COCAINSCRNUR, LABBENZ, AMPHETMU, THCU, LABBARB in the last 168 hours.  Alcohol Level  No results for input(s): ETH in the last 168 hours.   IMAGING past 24 hours CT ABDOMEN PELVIS WO CONTRAST  Result Date: 03/06/2021 CLINICAL DATA:  Fever, GI bleeding. EXAM: CT ABDOMEN AND PELVIS WITHOUT CONTRAST TECHNIQUE: Multidetector CT imaging of the abdomen and pelvis was performed following the standard protocol  without IV contrast. RADIATION DOSE REDUCTION: This exam was performed according to the departmental dose-optimization program which includes automated exposure control, adjustment of the mA and/or kV according to patient size and/or use of iterative reconstruction technique. COMPARISON:  CT abdomen and pelvis 03/01/2012. FINDINGS: Lower chest: There is atelectasis in both lower lobes with small bilateral pleural effusions. There is additional patchy airspace disease in the left lower lobe. There are ill-defined nodular and ground-glass nodular densities throughout the right lower lobe. Heart is enlarged. Hepatobiliary: No focal liver abnormality is seen. No gallstones, gallbladder wall thickening, or biliary dilatation. Pancreas: Unremarkable. No pancreatic ductal dilatation or surrounding inflammatory changes. Spleen: Normal in size without focal abnormality. Adrenals/Urinary Tract: There is mild nonspecific bilateral perinephric fat stranding. Go to musculoskeletal the adrenal glands and kidneys are otherwise within normal limits. There is a Foley catheter in the bladder. Stomach/Bowel: Stomach is within normal limits. Appendix appears normal. No evidence of bowel wall thickening, distention, or inflammatory changes. Oral contrast reaches the rectum. There is sigmoid colon diverticulosis without evidence for acute diverticulitis. 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 Vascular/Lymphatic: Aortic atherosclerosis. No enlarged abdominal or pelvic lymph nodes. Reproductive: Status post hysterectomy. No adnexal masses. Other: There  is no ascites. There is mild body wall edema. Wide-mouth fat containing umbilical hernia present. Musculoskeletal: No acute or significant osseous findings. IMPRESSION: 1. Left lower lobe airspace disease with right lower lobe nodular and ground-glass nodular opacities worrisome for multifocal infection. Follow-up chest CT recommended to exclude other etiologies. 2. Small  bilateral pleural effusions. 3. Mild nonspecific bilateral perinephric fat stranding. Correlate for infection. 4. Sigmoid colon diverticulosis. 5.  Aortic Atherosclerosis (ICD10-I70.0). Electronically Signed   By: Ronney Asters M.D.   On: 03/06/2021 16:17    PHYSICAL EXAM  Temp:  [99.9 F (37.7 C)-102.7 F (39.3 C)] 101.5 F (38.6 C) (01/19 0800) Pulse Rate:  [89-118] 115 (01/19 1000) Resp:  [12-34] 30 (01/19 1000) BP: (92-120)/(66-79) 120/77 (01/19 1000) SpO2:  [95 %-99 %] 97 % (01/19 1150) FiO2 (%):  [30 %] 30 % (01/19 1150)  General - [name] with tracheostomy in place  Cardiovascular - Regular rhythm but tachycardia.  Neuro -s/p tracheostomy on ventilator off sedation, arousable but very lethargic, not following commands today.   Right gaze preference barely cross midline.  PERRL, does not blink to threat on left side.  Will move LUE and LLE to noxious stimuli.  Sensation, coordination and gait not tested.   ASSESSMENT/PLAN Taylor [name] is a 56 y.o. female with history of CHF, T2DM and OSA presenting with acute onset left-sided weakness, right gaze deviation and slurred speech. She was found to have occlusion of the distal basilar artery, right P1 and proximal right P2.  She was given TNK and received mechanical thrombectomy to the right and left PCA with stenting on the left. She was found to have two apical thrombi on her 2D echocardiogram and remains on IV heparin. Extubated and then emergently reintubated on 02/20/2021.  She weaned on pressure support 5/5 with an FiO2 30% on 1/8, however unable to wean in the morning on 1/9.  CCM and steroid and diuretic course completed 02/23/2021 for swollen airway. WBC 13.8, respiratory, blood, and urine cultures sent. Vanco and cefepime started 02/24/2021, febrile despite abx. Tracheostomy and PEG have been placed, will likely discharge to LTAC.  Stroke:  embolic shower with b/l PCA occlusion s/p IR with TICI3 and left PCA  stenting, likely secondary due to embolism from low EF and apical thrombi Code Stroke CT head No acute abnormality.  ASPECTS 10.    CTA head & neck nonopacification of distal basilar artery, right P1 and P2, poor opacification of right vertebral artery, focal stenosis in right A1 and A3 IR with b/l P1 occlusion s/p thrombectomy with TICI3 and left PCA stent MRI  Acute right PCA territory infarct, in right thalamus and occipital lobe, small acute infarcts in bilateral cerebellar hemispheres with scattered small acute infarcts in bilateral parietal lobes, left occipital and left frontal lobes, area of susceptibility infarct in right occipital lobe suggestive of petechial hemorrhage MRA thrombectomy and placement of left P1/P2 stent with good flow related signal in distal left PCA, poor flow signal in distal basilar tip, poor opacification of right vertebral artery, right A1 and A3 ACA stenosis 2D Echo global hypokinesis with inferior akinesis, EF 0000000, grade 3 diastolic dysfunction, thrombi in inferoapical and anteroapical areas, no atrial level shunt LDL 151 HgbA1c 11.9 VTE prophylaxis -Heparin IV aspirin 81 mg daily prior to admission, now on Eliquis Heparin and brilinta d/c'd 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 02/17/21 E Coli  Sensitive to all antibiotics tested On cefepime -completed course Management per CCM on vanco and cefepime 02/24/2021 -> ancef  Hyperlipidemia Home meds:  atorvastatin 40 mg daily, resumed in hospital LDL 151, goal < 70 Increase to 80 mg atorvastatin daily  Continue statin at discharge  Diabetes type II Uncontrolled Home meds:  Jardiance 25 mg daily, levemir 17 units BID, metformin 1000 mg BID HgbA1c 11.9, goal < 7.0 CBGs Diabetes coordinator consult SSI- 0-20u q4  Other Stroke Risk Factors Former cigarette smoker Obstructive sleep apnea  Other Active Problems Hyperkalemia - 5.3 - > 5.0-> 4.2 (resolved) Dysphagia on tube feeding @ [facility] day # 23  Patient seen and examined by NP/APP with MD. MD to update note as needed.   Taylor Ores, DNP, FNP-BC [facility] Pager: [phone]  STROKE MD NOTE : I have personally obtained history,examined this patient, reviewed notes, independently viewed imaging studies, participated in medical decision making and plan of care.ROS completed by me personally and pertinent positives fully documented  I have made any additions or clarifications directly to the above note. Agree with note above.  Continue tracheostomy weaning trials as tolerated.  Continue antibiotics for infection.  Appreciate patient being transferred to critical care service due to ongoing medical issues.  Discussed with Dr. Silas Flood.  Stroke team will continue to follow.This patient is critically ill and at significant risk of neurological worsening, death and care requires constant monitoring of vital signs, hemodynamics,respiratory and cardiac monitoring, extensive review of multiple databases, frequent neurological assessment, discussion with family, other specialists and medical decision making of high complexity.I have made any additions or clarifications directly to the above note.This critical care time does not reflect procedure time, or teaching time or  supervisory time of PA/NP/Med Resident etc but could involve care discussion time.  I spent 30 minutes of neurocritical care time  in the care of  this patient.      Antony Contras, MD Medical Director [facility] Pager: [phone] 03/07/2021 1:56 PM    After hours, contact General Neurology

## 2021-03-07 NOTE — Progress Notes (Signed)
Occupational Therapy Treatment Patient Details Name: Taylor [name] MRN: [id number] DOB: Nov 06, 1965 Today's Date: 03/07/2021   History of present illness 56 y/o female presented to ED on 12/27 for L sided paralysis, R gaze, slurred speech, and L facial droop. TNK given. S/p mechanical thrombectomy of bilateral occluded P1/PCA. MRI showed acute R PCA infarcts including infarcts in the R thalamus and R occipital lobe with mild mass effect with partial effacement of 3rd ventricle, numerous small acute infarcts in bilateral cerebellar hemispheres and few scattered small acute infarcts in bilateral parietal lobes, L occipital and L frontal lobes. Intubated 12/27, trach 1/16. PEG 1/16. PMH: CHF, sleep apnea, type 2 DM   OT comments  Patient with no discernable progress to any patient focused goals.  Patient is no longer engaging in rehab sessions, and no attempts by the patient to assist, participate, or make eye contact.  OT reduced the frequency to 1x/wk, and patient is not following any commands wether on purpose, or due to an expansion of neuro deficits. Recommend [facility] term [facility] at [facility].    Recommendations for follow up therapy are one component of a multi-disciplinary discharge planning process, led by the attending physician.  Recommendations may be updated based on patient status, additional functional criteria and insurance authorization.    Follow Up Recommendations  OT at Long-term acute care hospital    Assistance Recommended at Discharge Frequent or constant Supervision/Assistance  Patient can return home with the following      Equipment Recommendations       Recommendations for Other Services      Precautions / Restrictions Precautions Precautions: Fall Precaution Comments: trach, PEG, rectal pouch, foley, PRAFOs Restrictions Weight Bearing Restrictions: No       Mobility Bed Mobility     Rolling: Total assist, +2 for physical assistance   Supine to sit: Total assist, +2  for physical assistance Sit to supine: Total assist, +2 for physical assistance        Transfers                         Balance                                           ADL either performed or assessed with clinical judgement   ADL   Eating/Feeding: NPO   Grooming: Total assistance;Bed level   Upper Body Bathing: Total assistance   Lower Body Bathing: Total assistance   Upper Body Dressing : Total assistance   Lower Body Dressing: Total assistance       Toileting- Clothing Manipulation and Hygiene: Total assistance         General ADL Comments: patient not particpating with any aspect of treatment    Extremity/Trunk Assessment Upper Extremity Assessment RUE Deficits / Details: No active movement. WFL PROM. LUE Deficits / Details: No active movement. WFL PROM.   Lower Extremity Assessment Lower Extremity Assessment: Defer to PT evaluation        Vision   Additional Comments: Patient no longer making any attempts to make eye contact   Perception Perception Perception: Not tested   Praxis Praxis Praxis: Not tested    Cognition Arousal/Alertness: Awake/alert Behavior During Therapy: Flat affect Overall Cognitive Status: Difficult to assess  General Comments: Patient not responding to most commands, and not engaged in session        Exercises      Shoulder Instructions       General Comments VSS on full vent support with trach    Pertinent Vitals/ Pain       Pain Assessment Faces Pain Scale: No hurt Pain Intervention(s): Monitored during session                                                          Frequency  Min 2X/week        Progress Toward Goals  OT Goals(current goals can now be found in the care plan section)  Progress towards OT goals: Not progressing toward goals - comment  Acute Rehab OT Goals OT Goal Formulation:  Patient unable to participate in goal setting Time For Goal Achievement: 03/14/21 Potential to Achieve Goals: Fair  Plan Discharge plan remains appropriate    Co-evaluation    PT/OT/SLP Co-Evaluation/Treatment: Yes Reason for Co-Treatment: Complexity of the patient's impairments (multi-system involvement);Necessary to address cognition/behavior during functional activity;For patient/therapist safety;To address functional/ADL transfers PT goals addressed during session: Mobility/safety with mobility;Strengthening/ROM OT goals addressed during session: ADL's and self-care      AM-PAC OT "6 Clicks" Daily Activity     Outcome Measure   Help from another person eating meals?: Total Help from another person taking care of personal grooming?: Total Help from another person toileting, which includes using toliet, bedpan, or urinal?: Total Help from another person bathing (including washing, rinsing, drying)?: Total Help from another person to put on and taking off regular upper body clothing?: Total Help from another person to put on and taking off regular lower body clothing?: Total 6 Click Score: 6    End of Session Equipment Utilized During Treatment: Oxygen  OT Visit Diagnosis: Muscle weakness (generalized) (M62.81);Other symptoms and signs involving cognitive function;Hemiplegia and hemiparesis Hemiplegia - Right/Left: Left Hemiplegia - dominant/non-dominant: Non-Dominant Hemiplegia - caused by: Cerebral infarction   Activity Tolerance Other (comment)   Patient Left in bed;with call bell/phone within reach;with bed alarm set   Nurse Communication          Time: 8242-3536 OT Time Calculation (min): 23 min  Charges: OT General Charges $OT Visit: 1 Visit OT Treatments $Self Care/Home Management : 8-22 mins  03/07/2021  RP, OTR/L  Acute Rehabilitation Services  Office:  [phone]   Taylor [name] 03/07/2021, 5:03 PM

## 2021-03-07 NOTE — Progress Notes (Signed)
NAME:  Taylor [name], MRN:  [id number], DOB:  [birth year], LOS: 41 ADMISSION DATE:  01/25/2021, CONSULTATION DATE:  12/27 REFERRING MD:  Dr Tennis Must Sindy Messing , CHIEF COMPLAINT:   CVA  History of Present Illness:  56 year old female with PMH as below, which is significant for HFrEF with LVEF 20-25 % (2017), DM, and OSA on CPAP. 12/27 she presented to [facility] ED with complaints of acute onset left sided weakness and slurred speech. EMS noted non-verbal and right gaze preference. Imaging consistent with occlusion of the distal basilar arterty, right P1, and proximal R P2. She was given systemic TNK and mechanical thrombectomy  with stent retriever on the right with complete recanalization after one pass (TICI3). PCCM consulted for vent management and ICU care.   Pertinent  Medical History   has a past medical history of CHF (congestive heart failure) ([place]), Diabetes mellitus without complication ([place]), and Obstructive sleep apnea.  Significant Hospital Events: Including procedures, antibiotic start and stop dates in addition to other pertinent events   12/27 admit for posterior circulation stroke. TNK given and IR throbmectomy 12/28 echo with LV thrombus, EF 20-25%.  Started on heparin 12/30 failing weaning trials.  Started on cefepime for positive UA 1/1 On PSV weans 1/4: patient extubated and developed stridor and increase wob; required reintubation; decadron and lasix given 1/6 restarted Decadron since there is no cuff leak.  Lasix dose increased 1/9 Still waiting on family to decide on tracheostomy  1/13 Diuresing, urinary retention foley placed 1/14 diuresis held, febrile, tachycardic, placed on cefepime 1/15 febrile still, no source of infection identified 1/16 creatinine creeping up suspect hypovolemia, increased losses with fever, cefepime stopped 1/17 Cr rising, fluids added, fevers continue, weight down 8 kg from admission 1/18 Cr rising, continue fluids, UOP better with  fluids 1/19 creatinine appears to have plateaued with addition of IV hydration  Interim History / Subjective:  No acute events overnight Remains on ventilator through tracheostomy T-max 102.7  Objective   Blood pressure 111/76, pulse (!) 112, temperature (!) 101.5 F (38.6 C), temperature source Axillary, resp. rate (!) 31, height 5\' 1"  (1.549 m), weight 55.1 kg, SpO2 97 %.    Vent Mode: PRVC FiO2 (%):  [30 %] 30 % Set Rate:  [18 bmp] 18 bmp Vt Set:  [380 mL] 380 mL PEEP:  [5 cmH20] 5 cmH20 Plateau Pressure:  [17 cmH20-21 cmH20] 17 cmH20   Intake/Output Summary (Last 24 hours) at 03/07/2021 0918 Last data filed at 03/07/2021 [id number] Gross per 24 hour  Intake 2687.88 ml  Output 1265 ml  Net 1422.88 ml    Filed Weights   02/26/21 0500 02/28/21 0500 03/05/21 0417  Weight: 60.2 kg 60 kg 55.1 kg    Examination: General: Acute on chronic ill-appearing middle-aged female lying in bed on mechanical ventilation through tracheostomy in no acute distress HEENT: 6 cuffed trach midline, MM pink/moist, PERRL,  Neuro: Will open eyes to verbal stimuli, unable to follow commands CV: s1s2 regular rate and rhythm, no murmur, rubs, or gallops,  PULM: Clear to auscultation bilaterally, no increased work of breathing, no added breath, tolerating ventilator GI: soft, bowel sounds active in all 4 quadrants, non-tender, non-distended, tolerating TF Extremities: warm/dry, no edema  Skin: no rashes or lesions   Assessment & Plan:   CVA -basilar, bilateral PI/PCA. TNK given in ED and she is s/p mechanical thrombectomy on the right and mechanical thrombectomy with stent placement on the left.  -Stroke signed off 1/18  P: Management per neurology  Maintain neuro protective measures Nutrition and bowel regiment  Seizure precautions  Aspirations precautions  Secondary stroke prevention including statin and Ticagrelor   Acute resp failure, inability to protect airway due to stroke:  -s/p trach due  to Tracheal Edema/Stenosis due to endotracheal tube P: Continue daily attempts to transition to ATC trials Continue ventilator support with lung protective strategies  Wean PEEP and FiO2 for sats greater than 90%. Head of bed elevated 30 degrees. Plateau pressures less than 30 cm H20.  Follow intermittent chest x-ray and ABG.   SAT/SBT as tolerated, mentation preclude extubation  Ensure adequate pulmonary hygiene  VAP bundle in place  PAD protocol  Chronic HFrEF: LVEF 20-25% with LV thrombus --unable to tolerate EB therapies for HF due to hypotension P: Continuous telemetry  Continue statin Strict intake and output  Daily weight to assess volume status Closely monitor renal function and electrolytes  Vent support as above Ensure hemodynamic control  Continue midodrine Continue Eliquis  E. coli UTI, MSSA pneumonia:  -completed course abx, fever started while on cefazolin for this. Fever -On 1/13, cultured 1/14 NGTD, UA clear, CXR clear, cefepime x 48 hrs without defervescence. On AC< DVT felt unlikely. Ongoing fever. P: Continue to trend CBC and fever curve Consider repeat respiratory culture Will discuss with attending need for resumption of antibiotics  Type 2 DM P: Continue SSI and tube feed coverage CBG every 4 hours CBG goal 140-180  Developing AKI:  -Suspect pre -renal with fever and insensible losses, preceding diuresis. UOP picked up with fluids 1/17, Cr rising still. P: Follow renal function  Monitor urine output Trend Bmet Avoid nephrotoxins Ensure adequate renal perfusion  IV and enteral hydration    Best Practice (right click and "Reselect all SmartList Selections" daily)   Diet/type: tubefeeds DVT prophylaxis: systemic heparin GI prophylaxis: PPI Lines: N/A Foley:  Yes, and it is still needed Code Status:  full code Last date of multidisciplinary goals of care discussion: Update family daily   Critical care time:   CRITICAL CARE Performed  by: Cherisse Carrell D. Harris  Total critical care time: 33 minutes  Critical care time was exclusive of separately billable procedures and treating other patients.  Critical care was necessary to treat or prevent imminent or life-threatening deterioration.  Critical care was time spent personally by me on the following activities: development of treatment plan with patient and/or surrogate as well as nursing, discussions with consultants, evaluation of patient's response to treatment, examination of patient, obtaining history from patient or surrogate, ordering and performing treatments and interventions, ordering and review of laboratory studies, ordering and review of radiographic studies, pulse oximetry and re-evaluation of patient's condition.   Samreet Edenfield D. Kenton Kingfisher, NP-C [facility] Personal contact information can be found on Amion  03/07/2021, 9:48 AM

## 2021-03-07 NOTE — Progress Notes (Signed)
Physical Therapy Treatment Patient Details Name: Taylor [name] MRN: [id number] DOB: 06-Aug-1965 Today's Date: 03/07/2021   History of Present Illness 56 y/o female presented to ED on 12/27 for L sided paralysis, R gaze, slurred speech, and L facial droop. TNK given. S/p mechanical thrombectomy of bilateral occluded P1/PCA. MRI showed acute R PCA infarcts including infarcts in the R thalamus and R occipital lobe with mild mass effect with partial effacement of 3rd ventricle, numerous small acute infarcts in bilateral cerebellar hemispheres and few scattered small acute infarcts in bilateral parietal lobes, L occipital and L frontal lobes. Intubated 12/27, trach 1/16. PEG 1/16. PMH: CHF, sleep apnea, type 2 DM    PT Comments    Patient not progressing towards goals at this time. Patient with lack of participation which seems due to low motivation compared to sessions during beginning of admission while intubated. Patient requires totalA+2 to perform bed mobility and unable to maintain sitting balance without external support. Limited command following <25% of session and requires max encouragement to engage. D/c plan remains appropriate.   *reduced frequency due to limited participation over past few therapy sessions.    Recommendations for follow up therapy are one component of a multi-disciplinary discharge planning process, led by the attending physician.  Recommendations may be updated based on patient status, additional functional criteria and insurance authorization.  Follow Up Recommendations  PT at Long-term acute care hospital     Assistance Recommended at Discharge Frequent or constant Supervision/Assistance  Patient can return home with the following Two people to help with bathing/dressing/bathroom;Two people to help with walking and/or transfers;Assistance with cooking/housework;Assistance with feeding;Direct supervision/assist for medications management;Direct supervision/assist for  financial management;Assist for transportation;Help with stairs or ramp for entrance   Equipment Recommendations  Hospital bed (hoyer lift)    Recommendations for Other Services       Precautions / Restrictions Precautions Precautions: Fall Precaution Comments: trach, PEG, rectal pouch, foley, PRAFOs Restrictions Weight Bearing Restrictions: No     Mobility  Bed Mobility Overal bed mobility: Needs Assistance Bed Mobility: Supine to Sit, Sit to Supine     Supine to sit: Total assist, +2 for physical assistance Sit to supine: Total assist, +2 for physical assistance   General bed mobility comments: lack of participation. Seems to be low motivation as compared to sessions at beginning of admission while intubated. No initiation of movement requiring totalA+2 to complete bed mobility    Transfers                   General transfer comment: unable    Ambulation/Gait                   Stairs             Wheelchair Mobility    Modified Rankin (Stroke Patients Only) Modified Rankin (Stroke Patients Only) Pre-Morbid Rankin Score: No significant disability Modified Rankin: Severe disability     Balance Overall balance assessment: Needs assistance Sitting-balance support: Feet supported, No upper extremity supported Sitting balance-Leahy Scale: Zero Sitting balance - Comments: able to maintain sitting EOB >10 minutes, however required totalA to maintain sitting balance Postural control: Left lateral lean, Posterior lean                                  Cognition Arousal/Alertness: Awake/alert Behavior During Therapy: Flat affect Overall Cognitive Status: Difficult to assess  General Comments: limited command following <25% of time. Max encouragement but patient not willing to actively participate compared to previous sessions prior to trach placement        Exercises Other  Exercises Other Exercises: cervical ROM while seated at EOB; requiring Min-Max A for positioning    General Comments General comments (skin integrity, edema, etc.): VSS on full vent support with trach      Pertinent Vitals/Pain Pain Assessment Pain Assessment: CPOT Facial Expression: Relaxed, neutral Body Movements: Absence of movements Muscle Tension: Relaxed Compliance with ventilator (intubated pts.): Tolerating ventilator or movement Vocalization (extubated pts.): N/A CPOT Total: 0 Pain Intervention(s): Monitored during session    Home Living                          Prior Function            PT Goals (current goals can now be found in the care plan section) Acute Rehab PT Goals Patient Stated Goal: unable to state PT Goal Formulation: Patient unable to participate in goal setting Time For Goal Achievement: 03/21/21 Potential to Achieve Goals: Poor Progress towards PT goals: Not progressing toward goals - comment    Frequency    Min 2X/week      PT Plan Current plan remains appropriate    Co-evaluation PT/OT/SLP Co-Evaluation/Treatment: Yes Reason for Co-Treatment: Necessary to address cognition/behavior during functional activity;For patient/therapist safety;To address functional/ADL transfers;Complexity of the patient's impairments (multi-system involvement) PT goals addressed during session: Mobility/safety with mobility;Strengthening/ROM        AM-PAC PT "6 Clicks" Mobility   Outcome Measure  Help needed turning from your back to your side while in a flat bed without using bedrails?: Total Help needed moving from lying on your back to sitting on the side of a flat bed without using bedrails?: Total Help needed moving to and from a bed to a chair (including a wheelchair)?: Total Help needed standing up from a chair using your arms (e.g., wheelchair or bedside chair)?: Total Help needed to walk in hospital room?: Total Help needed climbing  3-5 steps with a railing? : Total 6 Click Score: 6    End of Session Equipment Utilized During Treatment: Oxygen Activity Tolerance: Patient tolerated treatment well Patient left: in bed;with call bell/phone within reach Nurse Communication: Mobility status PT Visit Diagnosis: Unsteadiness on feet (R26.81);Muscle weakness (generalized) (M62.81);Difficulty in walking, not elsewhere classified (R26.2);Other symptoms and signs involving the nervous system [id number])     Time: [id number] PT Time Calculation (min) (ACUTE ONLY): 23 min  Charges:  $Therapeutic Activity: 8-22 mins                     Terence Bart A. Gilford Rile PT, DPT Acute Rehabilitation Services Pager [phone] Office [phone]    Linna Hoff 03/07/2021, 4:49 PM

## 2021-03-08 ENCOUNTER — Inpatient Hospital Stay (HOSPITAL_COMMUNITY): Payer: 59

## 2021-03-08 LAB — GLUCOSE, CAPILLARY
Glucose-Capillary: 160 mg/dL — ABNORMAL HIGH (ref 70–99)
Glucose-Capillary: 71 mg/dL (ref 70–99)
Glucose-Capillary: 153 mg/dL — ABNORMAL HIGH (ref 70–99)
Glucose-Capillary: 136 mg/dL — ABNORMAL HIGH (ref 70–99)
Glucose-Capillary: 108 mg/dL — ABNORMAL HIGH (ref 70–99)
Glucose-Capillary: 83 mg/dL (ref 70–99)

## 2021-03-08 LAB — CBC
HCT: 39 % (ref 36.0–46.0)
Hemoglobin: 11.7 g/dL — ABNORMAL LOW (ref 12.0–15.0)
MCH: 28.5 pg (ref 26.0–34.0)
MCHC: 30 g/dL (ref 30.0–36.0)
MCV: 94.9 fL (ref 80.0–100.0)
Platelets: 191 10*3/uL (ref 150–400)
RBC: 4.11 MIL/uL (ref 3.87–5.11)
RDW: 18.8 % — ABNORMAL HIGH (ref 11.5–15.5)
WBC: 15.2 10*3/uL — ABNORMAL HIGH (ref 4.0–10.5)
nRBC: 0.3 % — ABNORMAL HIGH (ref 0.0–0.2)

## 2021-03-08 LAB — BASIC METABOLIC PANEL
Anion gap: 14 (ref 5–15)
BUN: 59 mg/dL — ABNORMAL HIGH (ref 6–20)
CO2: 23 mmol/L (ref 22–32)
Calcium: 8 mg/dL — ABNORMAL LOW (ref 8.9–10.3)
Chloride: 107 mmol/L (ref 98–111)
Creatinine, Ser: 1.8 mg/dL — ABNORMAL HIGH (ref 0.44–1.00)
GFR, Estimated: 32 mL/min — ABNORMAL LOW (ref >60)
Glucose, Bld: 158 mg/dL — ABNORMAL HIGH (ref 70–99)
Potassium: 4.2 mmol/L (ref 3.5–5.1)
Sodium: 144 mmol/L (ref 135–145)

## 2021-03-08 NOTE — Progress Notes (Addendum)
NAME:  Taylor [name], MRN:  [id number], DOB:  04/18/1965, LOS: 24 ADMISSION DATE:  02/04/2021, CONSULTATION DATE:  12/27 REFERRING MD:  Dr Tennis Must Sindy Messing , CHIEF COMPLAINT:   CVA  History of Present Illness:  56 year old female with PMH as below, which is significant for HFrEF with LVEF 20-25 % (2017), DM, and OSA on CPAP. 12/27 she presented to [facility] ED with complaints of acute onset left sided weakness and slurred speech. EMS noted non-verbal and right gaze preference. Imaging consistent with occlusion of the distal basilar arterty, right P1, and proximal R P2. She was given systemic TNK and mechanical thrombectomy  with stent retriever on the right with complete recanalization after one pass (TICI3). PCCM consulted for vent management and ICU care.   Pertinent  Medical History   has a past medical history of CHF (congestive heart failure) ([place]), Diabetes mellitus without complication ([place]), and Obstructive sleep apnea.  Significant Hospital Events: Including procedures, antibiotic start and stop dates in addition to other pertinent events   12/27 admit for posterior circulation stroke. TNK given and IR throbmectomy 12/28 echo with LV thrombus, EF 20-25%.  Started on heparin 12/30 failing weaning trials.  Started on cefepime for positive UA 1/1 On PSV weans 1/4: patient extubated and developed stridor and increase wob; required reintubation; decadron and lasix given 1/6 restarted Decadron since there is no cuff leak.  Lasix dose increased 1/9 Still waiting on family to decide on tracheostomy  1/13 Diuresing, urinary retention foley placed 1/14 diuresis held, febrile, tachycardic, placed on cefepime 1/15 febrile still, no source of infection identified 1/16 Tracheostomy 1/17 Cr rising, fluids added, fevers continue, weight down 8 kg from admission 1/18 Cr rising, continue fluids, UOP better with fluids 1/19 creatinine appears to have plateaued with addition of IV  hydration  Interim History / Subjective:  No acute events overnight Remains intermittently febrile with a T-max of 102.4  Objective   Blood pressure 118/82, pulse (!) 114, temperature 100.1 F (37.8 C), temperature source Oral, resp. rate 18, height 5\' 1"  (1.549 m), weight 58 kg, SpO2 98 %.    Vent Mode: PRVC FiO2 (%):  [30 %] 30 % Set Rate:  [18 bmp] 18 bmp Vt Set:  [380 mL] 380 mL PEEP:  [5 cmH20] 5 cmH20 Plateau Pressure:  [17 cmH20-19 cmH20] 17 cmH20   Intake/Output Summary (Last 24 hours) at 03/08/2021 0901 Last data filed at 03/08/2021 0600 Gross per 24 hour  Intake 6637.35 ml  Output 1110 ml  Net 5527.35 ml    Filed Weights   02/28/21 0500 03/05/21 0417 03/08/21 0500  Weight: 60 kg 55.1 kg 58 kg    Examination: General: Acute on chronic ill-appearing middle-aged female lying in bed in no acute distress HEENT: ETT, MM pink/moist, PERRL,  Neuro: Will open eyes to verbal stimuli unable to follow commands CV: s1s2 regular rate and rhythm, no murmur, rubs, or gallops,  PULM: Clear to auscultation bilaterally, no increased work of breathing, no added breath sounds, no excessive respiratory secretions GI: soft, bowel sounds active in all 4 quadrants, non-tender, non-distended, tolerating TF Extremities: warm/dry, no edema  Skin: no rashes or lesions  Assessment & Plan:   CVA -basilar, bilateral PI/PCA. TNK given in ED and she is s/p mechanical thrombectomy on the right and mechanical thrombectomy with stent placement on the left.  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 Head of bed elevated 30 degrees. Plateau pressures less than 30 cm H20.  Follow intermittent chest x-ray and ABG.   SAT/SBT as tolerated, mentation preclude extubation  Ensure adequate pulmonary hygiene  VAP bundle in place  PAD protocol Follow repeat respiratory culture  Chronic HFrEF: LVEF 20-25% with LV thrombus --unable to tolerate EB therapies for HF due to hypotension P: Continuous telemetry Continue statin Strict intake and output Daily weight Daily monitoring of renal function and electrolytes Vent support as above Midodrine Continue Eliquis  E. coli UTI, MSSA pneumonia:  -completed course abx, fever started while on cefazolin for this. Fever -On 1/13, cultured 1/14 NGTD, UA clear, CXR clear, cefepime x 48 hrs without defervescence. On AC< DVT felt unlikely. Ongoing fever. P: Continue to trend CBC and fever curve Follow repeat respiratory culture Low threshold to resume antibiotics  Type 2 DM P: Continue SSI and to be coverage CBG every 4 hours CBG goal 140-180  Developing AKI:  -Suspect pre -renal with fever and insensible losses, preceding diuresis. UOP picked up with fluids 1/17, Cr rising still. -Adequate recalculation of INO's patient appears significantly volume positive a.m. of 1/20 P: Stop IV hydration Follow renal function  Monitor urine output Trend Bmet Avoid nephrotoxins Ensure adequate renal perfusion  Enteral IV hydration  Best Practice (right click and "Reselect all SmartList Selections" daily)   Diet/type: tubefeeds DVT prophylaxis: systemic heparin GI prophylaxis: PPI Lines: N/A Foley:  Yes, and it is still needed Code Status:  full code Last date of multidisciplinary goals of care discussion: Update family daily   Critical care time:   CRITICAL CARE Performed by: Remo Kirschenmann D. Harris  Total critical  care time: 38 minutes  Critical care time was exclusive of separately billable procedures and treating other patients.  Critical care was necessary to treat or prevent imminent or life-threatening deterioration.  Critical care was time spent personally by me on the following activities: development of treatment plan with patient and/or surrogate as well as nursing, discussions with consultants, evaluation of patient's response to treatment, examination of patient, obtaining history from patient or surrogate, ordering and performing treatments and interventions, ordering and review of laboratory studies, ordering and review of radiographic studies, pulse oximetry and re-evaluation of patient's condition.   Willine Schwalbe D. Kenton Kingfisher, NP-C [facility] Personal contact information can be found on Amion  03/08/2021, 9:01 AM

## 2021-03-08 NOTE — Progress Notes (Addendum)
STROKE TEAM PROGRESS NOTE   INTERVAL HISTORY:  Patient is seen in her room with no family at the bedside.  She continues to be febrile but has been hemodynamically stable.  Her neurological exam remains poor and she can be aroused and follows only occasional midline commands and will try gaze.  She is not moving extremities.  WBC is still elevated at 15.2.  Vitals:   03/08/21 1100 03/08/21 1158 03/08/21 1200 03/08/21 1300  BP: 112/80 108/81 117/84 113/83  Pulse: (!) 119 (!) 120 (!) 119 (!) 116  Resp: (!) 29 (!) 29 (!) 32 (!) 5  Temp:   (!) 102.4 F (39.1 C)   TempSrc:   Axillary   SpO2: 99% 98% 98% 98%  Weight:      Height:       CBC:  Recent Labs  Lab 03/05/21 0108 03/06/21 0826 03/07/21 0325 03/08/21 0324  WBC 14.4*   14.3*   < > 14.5* 15.2*  NEUTROABS 11.7*  --   --   --   HGB 12.4   12.1   < > 11.1* 11.7*  HCT 41.7   41.0   < > 37.6 39.0  MCV 97.4   97.2   < > 95.4 94.9  PLT 202   223   < > 182 191   < > = values in this interval not displayed.    Basic Metabolic Panel:  Recent Labs  Lab 03/07/21 0325 03/08/21 0324  NA 146* 144  K 4.3 4.2  CL 111 107  CO2 23 23  GLUCOSE 137* 158*  BUN 54* 59*  CREATININE 1.54* 1.80*  CALCIUM 7.7* 8.0*    Lipid Panel:  No results for input(s): CHOL, TRIG, HDL, CHOLHDL, VLDL, LDLCALC in the last 168 hours.  HgbA1c:  No results for input(s): HGBA1C in the last 168 hours.  Urine Drug Screen: No results for input(s): LABOPIA, COCAINSCRNUR, LABBENZ, AMPHETMU, THCU, LABBARB in the last 168 hours.  Alcohol Level  No results for input(s): ETH in the last 168 hours.   IMAGING past 24 hours No results found.  PHYSICAL EXAM  Temp:  [100.1 F (37.8 C)-102.4 F (39.1 C)] 102.4 F (39.1 C) (01/20 1200) Pulse Rate:  [95-120] 116 (01/20 1300) Resp:  [5-34] 5 (01/20 1300) BP: (98-122)/(69-97) 113/83 (01/20 1300) SpO2:  [94 %-99 %] 98 % (01/20 1300) FiO2 (%):  [30 %] 30 % (01/20 1200) Weight:  [58 kg] 58 kg (01/20  0500)  General - [name] with tracheostomy in place  Cardiovascular - Regular rhythm with tachycardia.  Neuro -s/p tracheostomy on ventilator off sedation, arousable but very lethargic,   following only occasional midline commands today.   Right gaze preference barely cross midline.  PERRL, does not blink to threat on left side.  No moving extremities to command today.  Sensation, coordination and gait not tested.   ASSESSMENT/PLAN Taylor [name] is a 56 y.o. female with history of CHF, T2DM and OSA presenting with acute onset left-sided weakness, right gaze deviation and slurred speech. She was found to have occlusion of the distal basilar artery, right P1 and proximal right P2.  She was given TNK and received mechanical thrombectomy to the right and left PCA with stenting on the left. She was found to have two apical thrombi on her 2D echocardiogram and remains on IV heparin. Extubated and then emergently reintubated on 02/20/2021.  She weaned on pressure support 5/5 with an FiO2 30% on 1/8, however unable  to wean in the morning on 1/9.  CCM and steroid and diuretic course completed 02/23/2021 for swollen airway. WBC 13.8, respiratory, blood, and urine cultures sent. Vanco and cefepime started 02/24/2021, febrile despite abx. Tracheostomy and PEG have been placed, will likely discharge to LTAC.  Heparin has been converted to Eliquis.  Stroke:  embolic shower with b/l PCA occlusion s/p IR with TICI3 and left PCA stenting, likely secondary due to embolism from low EF and apical thrombi Code Stroke CT head No acute abnormality.  ASPECTS 10.    CTA head & neck nonopacification of distal basilar artery, right P1 and P2, poor opacification of right vertebral artery, focal stenosis in right A1 and A3 IR with b/l P1 occlusion s/p thrombectomy with TICI3 and left PCA stent MRI  Acute right PCA territory infarct, in right thalamus and occipital lobe, small acute infarcts in bilateral  cerebellar hemispheres with scattered small acute infarcts in bilateral parietal lobes, left occipital and left frontal lobes, area of susceptibility infarct in right occipital lobe suggestive of petechial hemorrhage MRA thrombectomy and placement of left P1/P2 stent with good flow related signal in distal left PCA, poor flow signal in distal basilar tip, poor opacification of right vertebral artery, right A1 and A3 ACA stenosis 2D Echo global hypokinesis with inferior akinesis, EF 0000000, grade 3 diastolic dysfunction, thrombi in inferoapical and anteroapical areas, no atrial level shunt LDL 151 HgbA1c 11.9 VTE prophylaxis -Heparin IV aspirin 81 mg daily prior to admission, now on Eliquis Heparin and brilinta d/c'd 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% Two apical thrombi present Now on heparin IV and Brilinta  Respiratory failure Currently trached and ventilated Management per CCM Reintubated 1/4 for not tolerating extubation Vocal cord edema- steroids completed Continue diuresis today  Hx of hypertension Hypotension Home meds:  lisinopril 2.5 mg daily Stable on the low end SBP 90-100s Off Levophed On midodrine 10 mg every 8h Long-term BP goal normotensive  Fever Leukocytosis UTI WBC >15.0 -> 16.1-> 18.0-> 15.2-> 15.4-> 13.5-> 16.1-> 14.3-> 13.3-> 14.4-> 13.8-> 14.5 -> 15.2 T-max 102.7 UA WBC > 50 Urine culture Gram neg rods. 02/17/21 E Coli Sensitive to all antibiotics tested On cefepime -completed course Management per CCM on vanco and cefepime 02/24/2021 -> ancef  Hyperlipidemia Home meds:  atorvastatin 40 mg daily, resumed in hospital LDL 151, goal < 70 Increase to 80 mg atorvastatin daily  Continue statin at discharge  Diabetes type II Uncontrolled Home meds:  Jardiance 25 mg daily, levemir 17 units BID, metformin 1000 mg BID HgbA1c 11.9, goal < 7.0 CBGs Diabetes coordinator  consult SSI- 0-20u q4  Other Stroke Risk Factors Former cigarette smoker Obstructive sleep apnea  Other Active Problems Hyperkalemia - 5.3 - > 5.0-> 4.2 (resolved) Dysphagia on tube feeding @ [facility] day # 24  Patient seen and examined by NP/APP with MD. MD to update note as needed.   [place] , MSN, AGACNP-BC [facility] See Amion for schedule and pager information 03/08/2021 2:16 PM   I have personally obtained history,examined this patient, reviewed notes, independently viewed imaging studies, participated in medical decision making and plan of care.ROS completed by me personally and pertinent positives fully documented  I have made any additions or clarifications directly to the above note. Agree with note above.  Patient continues to have low-grade fever and now is of antibiotics but white count remains elevated and she is tachycardic.  Discussed with critical care team.  No family available at the bedside today.  Continue to try to wean off tracheostomy to trach collar as tolerated.This patient is critically ill and at significant risk of neurological worsening, death and care requires constant monitoring of vital signs, hemodynamics,respiratory and cardiac monitoring, extensive review of multiple databases, frequent neurological assessment, discussion with family, other specialists and medical decision making of high complexity.I have made any additions or clarifications directly to the above note.This critical care time does not reflect procedure time, or teaching time or supervisory time of PA/NP/Med Resident etc but could involve care discussion time.  I spent 30 minutes of neurocritical care time  in the care of  this patient.      Antony Contras, MD Medical Director [facility] Pager: [phone] 03/08/2021 3:44 PM   After hours, contact General Neurology

## 2021-03-08 NOTE — Progress Notes (Signed)
7 beat run of vtach on monitor. HR 120's and sustaining. MD notified.

## 2021-03-08 NOTE — TOC Progression Note (Signed)
Transition of Care [facility]) - Progression Note    Patient Details  Name: Taylor [name] MRN: [id number] Date of Birth: [birth year]  [facility] CM/SW Contact  Mearl Latin, LCSW Phone Number: 03/08/2021, 4:48 PM  Clinical Narrative:    Insurance approval still pending with [employer]. TOC to continue to follow.    Expected Discharge Plan: Long Term Acute Care (LTAC) Barriers to Discharge: Continued Medical Work up  Expected Discharge Plan and Services Expected Discharge Plan: Long Term Acute Care (LTAC)   Discharge Planning Services: CM Consult   Living arrangements for the past 2 months: Single Family Home                                       Social Determinants of Health (SDOH) Interventions    Readmission Risk Interventions No flowsheet data found.

## 2021-03-09 DIAGNOSIS — I5020 Unspecified systolic (congestive) heart failure: Secondary | ICD-10-CM | POA: Diagnosis not present

## 2021-03-09 DIAGNOSIS — Z430 Encounter for attention to tracheostomy: Secondary | ICD-10-CM

## 2021-03-09 DIAGNOSIS — J9601 Acute respiratory failure with hypoxia: Secondary | ICD-10-CM | POA: Diagnosis not present

## 2021-03-09 DIAGNOSIS — R509 Fever, unspecified: Secondary | ICD-10-CM | POA: Diagnosis not present

## 2021-03-09 LAB — HEPATIC FUNCTION PANEL
ALT: 25 U/L (ref 0–44)
AST: 62 U/L — ABNORMAL HIGH (ref 15–41)
Albumin: 1.8 g/dL — ABNORMAL LOW (ref 3.5–5.0)
Alkaline Phosphatase: 161 U/L — ABNORMAL HIGH (ref 38–126)
Bilirubin, Direct: 0.8 mg/dL — ABNORMAL HIGH (ref 0.0–0.2)
Indirect Bilirubin: 0.8 mg/dL (ref 0.3–0.9)
Total Bilirubin: 1.6 mg/dL — ABNORMAL HIGH (ref 0.3–1.2)
Total Protein: 6 g/dL — ABNORMAL LOW (ref 6.5–8.1)

## 2021-03-09 LAB — BASIC METABOLIC PANEL
Anion gap: 11 (ref 5–15)
BUN: 70 mg/dL — ABNORMAL HIGH (ref 6–20)
CO2: 23 mmol/L (ref 22–32)
Calcium: 7.9 mg/dL — ABNORMAL LOW (ref 8.9–10.3)
Chloride: 108 mmol/L (ref 98–111)
Creatinine, Ser: 2.38 mg/dL — ABNORMAL HIGH (ref 0.44–1.00)
GFR, Estimated: 23 mL/min — ABNORMAL LOW (ref >60)
Glucose, Bld: 102 mg/dL — ABNORMAL HIGH (ref 70–99)
Potassium: 3.8 mmol/L (ref 3.5–5.1)
Sodium: 142 mmol/L (ref 135–145)

## 2021-03-09 LAB — CBC
HCT: 50.7 % — ABNORMAL HIGH (ref 36.0–46.0)
HCT: 51.4 % — ABNORMAL HIGH (ref 36.0–46.0)
Hemoglobin: 15.6 g/dL — ABNORMAL HIGH (ref 12.0–15.0)
Hemoglobin: 15.6 g/dL — ABNORMAL HIGH (ref 12.0–15.0)
MCH: 29.1 pg (ref 26.0–34.0)
MCH: 28.5 pg (ref 26.0–34.0)
MCHC: 30.8 g/dL (ref 30.0–36.0)
MCHC: 30.4 g/dL (ref 30.0–36.0)
MCV: 94.6 fL (ref 80.0–100.0)
MCV: 93.8 fL (ref 80.0–100.0)
Platelets: 276 10*3/uL (ref 150–400)
Platelets: 249 10*3/uL (ref 150–400)
RBC: 5.36 MIL/uL — ABNORMAL HIGH (ref 3.87–5.11)
RBC: 5.48 MIL/uL — ABNORMAL HIGH (ref 3.87–5.11)
RDW: 19.6 % — ABNORMAL HIGH (ref 11.5–15.5)
RDW: 19.7 % — ABNORMAL HIGH (ref 11.5–15.5)
WBC: 3.2 10*3/uL — ABNORMAL LOW (ref 4.0–10.5)
WBC: 4.7 10*3/uL (ref 4.0–10.5)
nRBC: 2.8 % — ABNORMAL HIGH (ref 0.0–0.2)
nRBC: 2.6 % — ABNORMAL HIGH (ref 0.0–0.2)

## 2021-03-09 LAB — MAGNESIUM: Magnesium: 3.5 mg/dL — ABNORMAL HIGH (ref 1.7–2.4)

## 2021-03-09 LAB — GLUCOSE, CAPILLARY
Glucose-Capillary: 86 mg/dL (ref 70–99)
Glucose-Capillary: 73 mg/dL (ref 70–99)
Glucose-Capillary: 86 mg/dL (ref 70–99)
Glucose-Capillary: 83 mg/dL (ref 70–99)

## 2021-03-09 LAB — CULTURE, RESPIRATORY W GRAM STAIN: Culture: NORMAL

## 2021-03-09 LAB — BRAIN NATRIURETIC PEPTIDE: B Natriuretic Peptide: 4451.2 pg/mL — ABNORMAL HIGH (ref 0.0–100.0)

## 2021-03-09 MED ORDER — FENTANYL BOLUS VIA INFUSION
50.0000 ug | INTRAVENOUS | Status: DC | PRN
  Filled 2021-03-09: qty 100

## 2021-03-09 MED ORDER — PHENYLEPHRINE HCL-NACL 20-0.9 MG/250ML-% IV SOLN
25.0000 ug/min | INTRAVENOUS | Status: DC
  Administered 2021-03-09: 25 ug/min via INTRAVENOUS
  Administered 2021-03-09 (×3): 200 ug/min via INTRAVENOUS
  Filled 2021-03-09 (×5): qty 250

## 2021-03-09 MED ORDER — SODIUM CHLORIDE 0.9 % IV SOLN: 250.0000 mL | INTRAVENOUS | Status: DC

## 2021-03-09 MED ORDER — NOREPINEPHRINE 4 MG/250ML-% IV SOLN
INTRAVENOUS | Status: AC
  Administered 2021-03-09: 2 ug/min via INTRAVENOUS
  Filled 2021-03-09: qty 250

## 2021-03-09 MED ORDER — FENTANYL CITRATE PF 50 MCG/ML IJ SOSY
50.0000 ug | PREFILLED_SYRINGE | INTRAMUSCULAR | Status: DC | PRN
  Administered 2021-03-09 (×3): 100 ug via INTRAVENOUS
  Filled 2021-03-09 (×3): qty 2

## 2021-03-09 MED ORDER — GLYCOPYRROLATE 0.2 MG/ML IJ SOLN
0.2000 mg | Freq: Two times a day (BID) | INTRAMUSCULAR | Status: DC
  Administered 2021-03-09 (×2): 0.2 mg via INTRAVENOUS
  Filled 2021-03-09 (×2): qty 1

## 2021-03-09 MED ORDER — DOPAMINE-DEXTROSE 3.2-5 MG/ML-% IV SOLN
0.0000 ug/kg/min | INTRAVENOUS | Status: DC
  Administered 2021-03-09: 1.5 ug/kg/min via INTRAVENOUS
  Filled 2021-03-09: qty 250

## 2021-03-09 MED ORDER — FENTANYL CITRATE PF 50 MCG/ML IJ SOSY
PREFILLED_SYRINGE | INTRAMUSCULAR | Status: AC
  Administered 2021-03-09: 50 ug via INTRAVENOUS
  Filled 2021-03-09: qty 1

## 2021-03-09 MED ORDER — FENTANYL CITRATE PF 50 MCG/ML IJ SOSY: 50.0000 ug | PREFILLED_SYRINGE | INTRAMUSCULAR | Status: DC | PRN

## 2021-03-09 MED ORDER — FENTANYL 2500MCG IN NS 250ML (10MCG/ML) PREMIX INFUSION
50.0000 ug/h | INTRAVENOUS | Status: DC
  Administered 2021-03-09: 50 ug/h via INTRAVENOUS

## 2021-03-09 MED ORDER — NOREPINEPHRINE 4 MG/250ML-% IV SOLN: 2.0000 ug/min | INTRAVENOUS | Status: DC

## 2021-03-09 MED ORDER — SODIUM CHLORIDE 0.9 % IV BOLUS
500.0000 mL | Freq: Once | INTRAVENOUS | Status: AC
  Administered 2021-03-09: 500 mL via INTRAVENOUS

## 2021-03-09 MED ORDER — FUROSEMIDE 10 MG/ML IJ SOLN
4.0000 mg/h | INTRAVENOUS | Status: DC
  Administered 2021-03-09: 4 mg/h via INTRAVENOUS
  Filled 2021-03-09: qty 20

## 2021-03-09 MED ORDER — FENTANYL CITRATE PF 50 MCG/ML IJ SOSY: 50.0000 ug | PREFILLED_SYRINGE | Freq: Once | INTRAMUSCULAR | Status: DC

## 2021-03-09 NOTE — Progress Notes (Signed)
eLink Physician-Brief Progress Note Patient Name: Taylor [name] DOB: 1965/09/15 MRN: [id number]   Date of Service  03/09/2021  HPI/Events of Note  Patient's blood pressure is improved on Dopamine gtt at 2.5 mcg (90/78), however urine output has not significantly improved, patien is charted as positive 700 ml for the shift and 9 liters cumulatively but there's a concern that diarrhea is not being accounted for fully.  eICU Interventions  Will check a BNP to try to gauge volume status since she does not have a central line, ruling out the possibility of obtaining a CVP, will determine treatment options based on BNP results.        Kerry Kass Jashan Cotten 03/09/2021, 3:22 AM

## 2021-03-09 NOTE — Progress Notes (Addendum)
0000: Pt with minimal UOP (20cc/2hr). BP soft as well, 70s/50s. Elink notified. Waiting for response from MD. RN will continue to monitor.   85: New order for dopamine.

## 2021-03-09 NOTE — Progress Notes (Signed)
eLink Physician-Brief Progress Note Patient Name: Taylor [name] DOB: 1965-07-24 MRN: [id number]   Date of Service  03/09/2021  HPI/Events of Note  BNP 4451, she has received 200 ml of Normal Saline so far without improvement in urine output.  eICU Interventions  Intravenous fluids discontinued. Lasix gtt ordered at 4 mg / hour.        Thomasene Lot Bernis Schreur 03/09/2021, 6:13 AM

## 2021-03-09 NOTE — Progress Notes (Signed)
eLink Physician-Brief Progress Note Patient Name: Taylor [name] DOB: Jun 30, 1965 MRN: [id number]   Date of Service  03/09/2021  HPI/Events of Note  56 y/o F with large CVA, extubated and reintubated, got TNK, now s/p Trach/PEG, continues to decline from presumed superimposed sepsis. Plan was for extubation when all family arrived and present. RN requesting code status clarification- currently listed as partial code, but already maxed on peripheral levophed/phenylephrine. I added peripheral levophed earlier. Family does not want higher doses or central line or any care escalation. I spoke to them at bedside   eICU Interventions  Continue current care until rest of family arrives. No escalation of care. Changed status to DNR.         Stanford Strauch N Jermel Artley 03/09/2021, 10:40 PM

## 2021-03-09 NOTE — Progress Notes (Signed)
[place] Progress Note Patient Name: Taylor [name] DOB: 19-May-1965 MRN: [id number]   Date of Service  03/09/2021  HPI/Events of Note  Patient with soft blood pressures (79/53) and oliguria, likely stigmata of a low cardiac output state, given EF of 20 - 25 %.  eICU Interventions  Will trial a fixed dose Dopamine gtt at 2.5 mcg  / kg / min.        Kerry Kass Donovon Micheletti 03/09/2021, 12:28 AM

## 2021-03-09 NOTE — Progress Notes (Signed)
Verbal order from Dr. Lucile Shutters to stop fluid bolus.

## 2021-03-09 NOTE — Progress Notes (Signed)
NAME:  Taylor [name], MRN:  [id number], DOB:  1965-10-02, LOS: 30 ADMISSION DATE:  02/02/2021, CONSULTATION DATE:  12/27 REFERRING MD:  Dr Tennis Must Sindy Messing , CHIEF COMPLAINT:   CVA  History of Present Illness:  56 year old female with PMH as below, which is significant for HFrEF with LVEF 20-25 % (2017), DM, and OSA on CPAP. 12/27 she presented to [facility] ED with complaints of acute onset left sided weakness and slurred speech. EMS noted non-verbal and right gaze preference. Imaging consistent with occlusion of the distal basilar arterty, right P1, and proximal R P2. She was given systemic TNK and mechanical thrombectomy  with stent retriever on the right with complete recanalization after one pass (TICI3). PCCM consulted for vent management and ICU care.   Pertinent  Medical History   has a past medical history of CHF (congestive heart failure) ([place]), Diabetes mellitus without complication ([place]), and Obstructive sleep apnea.  Significant Hospital Events: Including procedures, antibiotic start and stop dates in addition to other pertinent events   12/27 admit for posterior circulation stroke. TNK given and IR throbmectomy 12/28 echo with LV thrombus, EF 20-25%.  Started on heparin 12/30 failing weaning trials.  Started on cefepime for positive UA 1/1 On PSV weans 1/4: patient extubated and developed stridor and increase wob; required reintubation; decadron and lasix given 1/6 restarted Decadron since there is no cuff leak.  Lasix dose increased 1/9 Still waiting on family to decide on tracheostomy  1/13 Diuresing, urinary retention foley placed 1/14 diuresis held, febrile, tachycardic, placed on cefepime 1/15 febrile still, no source of infection identified 1/16 Tracheostomy 1/17 Cr rising, fluids added, fevers continue, weight down 8 kg from admission 1/18 Cr rising, continue fluids, UOP better with fluids 1/19 creatinine appears to have plateaued with addition of IV hydration 1/21  patient started on low-dose dopamine overnight due to hypotension with concern for evolving cardiogenic shock.  Renal function also continues to worsen with decreased urine output Phone conversation held with patient daughter regarding goals of care, see separate note for full details  Interim History / Subjective:  Dopamine started overnight Urine output downtrending  Objective   Blood pressure 92/66, pulse 97, temperature 99.3 F (37.4 C), temperature source Oral, resp. rate (!) 29, height 5\' 1"  (1.549 m), weight 58.2 kg, SpO2 95 %.    Vent Mode: PRVC FiO2 (%):  [30 %] 30 % Set Rate:  [18 bmp] 18 bmp Vt Set:  [380 mL] 380 mL PEEP:  [5 cmH20] 5 cmH20 Plateau Pressure:  [17 cmH20-20 cmH20] 20 cmH20   Intake/Output Summary (Last 24 hours) at 03/09/2021 [id number] Last data filed at 03/09/2021 0600 Gross per 24 hour  Intake 3358.39 ml  Output 2130 ml  Net 1228.39 ml    Filed Weights   03/05/21 0417 03/08/21 0500 03/09/21 0500  Weight: 55.1 kg 58 kg 58.2 kg    Examination: General: Acute on chronic ill-appearing middle-aged female lying in bed in no acute distress HEENT: Trach midline, MM pink/moist, PERRL,  Neuro: Eyes spontaneously open unable to follow any commands unable to track CV: s1s2 regular rate and rhythm, no murmur, rubs, or gallops,  PULM: Mild tachypnea, clear to auscultation bilaterally, no increased work of breathing, no added breath sounds GI: soft, bowel sounds active in all 4 quadrants, non-tender, non-distended, tolerating TF Extremities: warm/dry, left edema  Skin: no rashes or lesions  Assessment & Plan:   CVA -basilar, bilateral PI/PCA. TNK given in ED and she is  s/p mechanical thrombectomy on the right and mechanical thrombectomy with stent placement on the left.  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 Head of bed elevated 30 degrees. Plateau pressures less than 30 cm H20.  Follow intermittent chest x-ray and ABG.   SAT/SBT as tolerated, mentation preclude extubation  Ensure adequate pulmonary hygiene  VAP bundle in place  Follow repeat respiratory culture  Concern for evolving cardiogenic shock -Patient became hypotensive with decreased urine output overnight of 11/20 Chronic HFrEF: LVEF 20-25% with LV thrombus --unable to tolerate EB therapies for HF due to hypotension P: Family to pursue comfort care measures in the coming days.  Patient's daughter will discuss goals of care with family tentative plan to remove ventilator support 1/23 for now we will continue: Continue dopamine Continuous telemetry Continue statin Strict intake and output Continue midodrine Continue Eliquis  E. coli UTI, MSSA pneumonia:  -completed course abx, fever started while on cefazolin for this. Fever -On 1/13, cultured 1/14 NGTD, UA clear, CXR clear, cefepime x 48 hrs without defervescence. On AC< DVT felt unlikely.  -White blood cell count has dropped to 3.2 morning of 1/21 P: Continue to monitor patient's clinical status off antibiotics Trend CBC and fever curve Follow repeat respiratory culture  Type 2 DM P: Continue SSI and tube feed coverage CBG every 4 hours CBG goal 140-180  AKI:  -Suspect pre -renal with fever and insensible losses, preceding diuresis. UOP picked up with fluids 1/17, Cr rising still. -Adequate recalculation of INO's patient appears significantly volume positive a.m. of 1/20 P: Creatinine has now up trended to 2.38 with decreasing urine output Follow renal  function  Monitor urine output Trend Bmet Avoid nephrotoxins Ensure adequate renal perfusion    Best Practice (right click and "Reselect all SmartList Selections" daily)   Diet/type: tubefeeds DVT prophylaxis: systemic heparin GI prophylaxis: PPI Lines: N/A Foley:  Yes, and it is still needed Code Status:  full code Last date of multidisciplinary goals of care discussion: Spoke with patient's daughter Roland Earl 1/21 and decision made to change CODE STATUS to limited [place] with tentative plan to transition to comfort care 1/23.  See separate plan of care note dated 1/21 for full details  Critical care time:   CRITICAL CARE Performed by: Nabria Nevin D. Harris  Total critical care time: 45 minutes  Critical care time was exclusive of separately billable procedures and treating other patients.  Critical care was necessary to treat or prevent imminent or life-threatening deterioration.  Critical care was time spent personally by me on the following activities: development of treatment plan with patient and/or surrogate as well as nursing, discussions with consultants, evaluation of patient's response to treatment, examination of patient, obtaining history from patient or surrogate, ordering and performing treatments and interventions, ordering and review of laboratory studies, ordering and review of radiographic studies, pulse oximetry and re-evaluation of patient's condition.   Nelva Hauk D. Kenton Kingfisher, NP-C [facility] Personal contact information can be found on Amion  03/09/2021, 8:03 AM

## 2021-03-09 NOTE — Progress Notes (Signed)
eLink Physician-Brief Progress Note Patient Name: Taylor [name] DOB: 10-02-1965 MRN: [id number]   Date of Service  03/09/2021  HPI/Events of Note  Patient remains profoundly oliguric. Labs had to be re-drawn so BNP has not resulted, in the mean-time patient put out another 900 ml of stool.  eICU Interventions  500 ml Normal Saline bolus ordered at 250 ml / hour.        Thomasene Lot Hiroshi Krummel 03/09/2021, 5:24 AM

## 2021-03-09 NOTE — Progress Notes (Signed)
PCCM Progress Note   Throughout the afternoon patient continues to decline with worsening vital signs to include T-max 104.3, HR 121, B/P 75/48 (maxed on peripheral Neo). Family was notified of continued progressive decline and family agrees to start Fentanyl drip but continue all other current measures to try and allow family time to travel in. No escalation in case  Family reiterates that comfort is the main goal now.  Risa Auman D. Tiburcio Pea, NP-C [facility] Personal contact information can be found on Amion  03/09/2021, 5:17 PM

## 2021-03-09 NOTE — Plan of Care (Signed)
°  Interdisciplinary Goals of Care Family Meeting   Date carried out:: 03/09/2021  Location of the meeting: Phone conference  Member's involved: Nurse Practitioner and Family Member or next of kin  Durable Power of Attorney or acting medical decision maker: Shared amongst siblings but patient's daughter Taylor [name] is acting as the primary decision maker for family currently  Discussion: We discussed goals of care for Taylor [name] .  Called patient's daughter Taylor [name] am of 1/21 to discuss events overnight and clinical status.  Taylor [name] stated she understands how her mother is and has been concerned over the last few days that she may be suffering.  Given worsening clinical status to include possible signs of evolving cardiogenic shock with concern of impending renal failure Taylor [name] has made the decision to change CODE STATUS to limited CODE BLUE and she is planning to discuss with family timing for transition to comfort care.  Currently she expects all family to agree for compassionate extubation from ventilator with initiation of comfort measures 1/23  Code status: Limited Code or DNR with short term  Disposition: Continue current acute care  Time spent for the meeting: 25  Taylor Seidman D. Kenton Kingfisher, NP-C [place] [facility] Personal contact information can be found on Amion  03/09/2021, 8:46 AM

## 2021-03-09 NOTE — Progress Notes (Addendum)
STROKE TEAM PROGRESS NOTE   INTERVAL HISTORY:  Patient is seen in her room with no family at the bedside.  She continues to be febrile although WBC has decreased to 4.7.  Overnight, she became hypotensive with oliguria and was started on dopamine and lasix gtt.  BNP elevated at 4451.  Patient's prognosis is poor with prolonged hospitalization and increasing complications. Plan is to transition to comfort care on Monday. Vitals:   03/09/21 0930 03/09/21 1000 03/09/21 1037 03/09/21 1100  BP: 99/71 131/88 112/69 106/67  Pulse:  (!) 120 (!) 140   Resp: (!) 31 (!) 31 (!) 31 15  Temp:      TempSrc:      SpO2:  93% 96%   Weight:      Height:       CBC:  Recent Labs  Lab 03/05/21 0108 03/06/21 0826 03/09/21 0409 03/09/21 1006  WBC 14.4*   14.3*   < > 3.2* 4.7  NEUTROABS 11.7*  --   --   --   HGB 12.4   12.1   < > 15.6* 15.6*  HCT 41.7   41.0   < > 50.7* 51.4*  MCV 97.4   97.2   < > 94.6 93.8  PLT 202   223   < > 276 249   < > = values in this interval not displayed.    Basic Metabolic Panel:  Recent Labs  Lab 03/08/21 0324 03/09/21 0409  NA 144 142  K 4.2 3.8  CL 107 108  CO2 23 23  GLUCOSE 158* 102*  BUN 59* 70*  CREATININE 1.80* 2.38*  CALCIUM 8.0* 7.9*  MG  --  3.5*    Lipid Panel:  No results for input(s): CHOL, TRIG, HDL, CHOLHDL, VLDL, LDLCALC in the last 168 hours.  HgbA1c:  No results for input(s): HGBA1C in the last 168 hours.  Urine Drug Screen: No results for input(s): LABOPIA, COCAINSCRNUR, LABBENZ, AMPHETMU, THCU, LABBARB in the last 168 hours.  Alcohol Level  No results for input(s): ETH in the last 168 hours.   IMAGING past 24 hours DG CHEST PORT 1 VIEW  Result Date: 03/08/2021 CLINICAL DATA:  Respiratory distress, CHF, diabetes EXAM: PORTABLE CHEST 1 VIEW COMPARISON:  02/21/2021 FINDINGS: Rotated AP portable examination. Gross cardiomegaly. Tracheostomy. Mild, diffuse bilateral interstitial pulmonary opacity, increased compared to prior  examination. The visualized skeletal structures are unremarkable. IMPRESSION: 1.  Gross cardiomegaly. 2. Mild, diffuse bilateral interstitial pulmonary opacity, increased compared to prior examination, consistent with edema. No focal airspace opacity. 3.  Tracheostomy. Electronically Signed   By: Delanna Ahmadi M.D.   On: 03/08/2021 16:05    PHYSICAL EXAM  Temp:  [97.7 F (36.5 C)-102.7 F (39.3 C)] 100.7 F (38.2 C) (01/21 0800) Pulse Rate:  [83-140] 140 (01/21 1037) Resp:  [0-31] 15 (01/21 1100) BP: (76-131)/(53-88) 106/67 (01/21 1100) SpO2:  [89 %-100 %] 96 % (01/21 1037) FiO2 (%):  [30 %-40 %] 40 % (01/21 1037) Weight:  [58.2 kg] 58.2 kg (01/21 0500)  General - [name] with tracheostomy in place  Cardiovascular - Regular rhythm with tachycardia.  Neuro -s/p tracheostomy on ventilator off sedation, arousable but very lethargic,   no following commands to move extremities but will follow examiner with her eyes.  PERRL, blinks to threat bilaterally, has left gaze preference but able to overcome.  No movement of extremities to noxious stimuli.   ASSESSMENT/PLAN Ms. BRAELY STEPANYAN is a 56 y.o. female with history of  CHF, T2DM and OSA presenting with acute onset left-sided weakness, right gaze deviation and slurred speech. She was found to have occlusion of the distal basilar artery, right P1 and proximal right P2.  She was given TNK and received mechanical thrombectomy to the right and left PCA with stenting on the left. She was found to have two apical thrombi on her 2D echocardiogram and remains on IV heparin. Extubated and then emergently reintubated on 02/20/2021.  She weaned on pressure support 5/5 with an FiO2 30% on 1/8, however unable to wean in the morning on 1/9.  CCM and steroid and diuretic course completed 02/23/2021 for swollen airway. Patient continues to be febrile with no source identified.Tracheostomy and PEG have been placed, and heparin has been converted  to Eliquis.  Overnight, she became hypotensive with oliguria and was started on dopamine and lasix gtt.  BNP elevated at 4451.  Patient's prognosis is poor with prolonged hospitalization and increasing complications. Plan is to transition to comfort care on Monday.  Stroke:  embolic shower with b/l PCA occlusion s/p IR with TICI3 and left PCA stenting, likely due to embolism from low EF and apical thrombi Code Stroke CT head No acute abnormality.  ASPECTS 10.    CTA head & neck nonopacification of distal basilar artery, right P1 and P2, poor opacification of right vertebral artery, focal stenosis in right A1 and A3 IR with b/l P1 occlusion s/p thrombectomy with TICI3 and left PCA stent MRI  Acute right PCA territory infarct, in right thalamus and occipital lobe, small acute infarcts in bilateral cerebellar hemispheres with scattered small acute infarcts in bilateral parietal lobes, left occipital and left frontal lobes, area of susceptibility infarct in right occipital lobe suggestive of petechial hemorrhage MRA thrombectomy and placement of left P1/P2 stent with good flow related signal in distal left PCA, poor flow signal in distal basilar tip, poor opacification of right vertebral artery, right A1 and A3 ACA stenosis 2D Echo global hypokinesis with inferior akinesis, EF 0000000, grade 3 diastolic dysfunction, thrombi in inferoapical and anteroapical areas, no atrial level shunt LDL 151 HgbA1c 11.9 VTE prophylaxis -Heparin IV aspirin 81 mg daily prior to admission, now on Eliquis Heparin and brilinta d/c'd 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% Two apical thrombi present Now on heparin IV and Brilinta BNP 4451 Dopamine overnight for low CO and hypotension  Respiratory failure Currently trached and ventilated Management per CCM Reintubated 1/4 for not tolerating extubation Vocal cord edema- steroids  completed Tracheostomy placed 1/16 but still unable to wean from ventilator  Hx of hypertension Hypotension Home meds:  lisinopril 2.5 mg daily Stable on the low end SBP 90-100s Off Levophed On midodrine 10 mg every 8h Dopamine for low CO and hypotension overnight Long-term BP goal normotensive  Fever Leukocytosis UTI WBC >15.0 -> 16.1-> 18.0-> 15.2-> 15.4-> 13.5-> 16.1-> 14.3-> 13.3-> 14.4-> 13.8-> 14.5 -> 15.2 ->4.7 T-max 102.9 UA WBC > 50 Urine culture Gram neg rods. 02/17/21 E Coli Sensitive to all antibiotics tested On cefepime -completed course Management per CCM on vanco and cefepime 02/24/2021 -> ancef-> completed course Continues to be febrile with no known source  Hyperlipidemia Home meds:  atorvastatin 40 mg daily, resumed in hospital LDL 151, goal < 70 Increase to 80 mg atorvastatin daily  Continue statin at discharge  Diabetes type II Uncontrolled Home meds:  Jardiance 25 mg daily, levemir 17 units BID, metformin 1000 mg BID HgbA1c 11.9,  goal < 7.0 CBGs Diabetes coordinator consult SSI- 0-20u q4  Other Stroke Risk Factors Former cigarette smoker Obstructive sleep apnea  Other Active Problems Hyperkalemia - 5.3 - > 5.0-> 4.2 (resolved) Dysphagia on tube feeding @ [facility] day # 25  Patient seen and examined by NP/APP with MD. MD to update note as needed.   [place] , MSN, AGACNP-BC [facility] See Amion for schedule and pager information 03/09/2021 12:20 PM   ATTENDING NOTE: I reviewed above note and agree with the assessment and plan. Pt was seen and examined.   No family at bedside.  Patient lying in bed, eyes spontaneous open seems more awake than yesterday.  However not following commands, left gaze preference, not tracking.  Still has high-grade fever, worsening renal function, overnight no urine output, put on dopamine drip.  Continue to have a tachycardia and hypotension.  Palliative care on board, discussed with  patient regarding progressive decline.  Family now requested fentanyl drip but keep other current treatment without escalation, plan for comfort care measures on Monday.  For detailed assessment and plan, please refer to above as I have made changes wherever appropriate.   Rosalin Hawking, MD PhD Stroke Neurology 03/09/2021 6:44 PM    After hours, contact General Neurology

## 2021-03-09 NOTE — Progress Notes (Signed)
Daughter Glee Arvin) at bedside with other family members. Latoya told this RN that they were ready to make the patient comfort care. Elink called and notified of this.

## 2021-03-09 NOTE — Progress Notes (Signed)
Spoke with Harrison Mons, family support coordinator, with [facility]. Patient is rule out for donation. Call with cardiac time of death.

## 2021-03-10 MED ORDER — HALOPERIDOL 0.5 MG PO TABS: 0.5000 mg | ORAL_TABLET | ORAL | Status: DC | PRN

## 2021-03-10 MED ORDER — DIPHENHYDRAMINE HCL 50 MG/ML IJ SOLN: 12.5000 mg | INTRAMUSCULAR | Status: DC | PRN

## 2021-03-10 MED ORDER — POLYVINYL ALCOHOL 1.4 % OP SOLN: 1.0000 [drp] | Freq: Four times a day (QID) | OPHTHALMIC | Status: DC | PRN

## 2021-03-10 MED ORDER — BIOTENE DRY MOUTH MT LIQD: 15.0000 mL | OROMUCOSAL | Status: DC | PRN

## 2021-03-10 MED ORDER — LORAZEPAM 2 MG/ML IJ SOLN: 1.0000 mg | INTRAMUSCULAR | Status: DC | PRN

## 2021-03-10 MED ORDER — GLYCOPYRROLATE 1 MG PO TABS: 1.0000 mg | ORAL_TABLET | ORAL | Status: DC | PRN

## 2021-03-10 MED ORDER — HALOPERIDOL LACTATE 5 MG/ML IJ SOLN: 0.5000 mg | INTRAMUSCULAR | Status: DC | PRN

## 2021-03-10 MED ORDER — HALOPERIDOL LACTATE 2 MG/ML PO CONC: 0.5000 mg | ORAL | Status: DC | PRN

## 2021-03-10 MED ORDER — GLYCOPYRROLATE 0.2 MG/ML IJ SOLN: 0.2000 mg | INTRAMUSCULAR | Status: DC | PRN

## 2021-03-12 ENCOUNTER — Ambulatory Visit: Payer: 59 | Admitting: Family

## 2021-03-20 NOTE — Death Summary Note (Addendum)
°  DEATH SUMMARY   Patient Details  Name: Taylor [name] MRN: [id number] DOB: 1965-08-16  Admission/Discharge Information   Admit Date:  02/21/2021  Date of Death: Date of Death: Apr 05, 2021  Time of Death: Time of Death: 06-21-49  Length of Stay: Jul 09, 2022  Referring Physician: Kirk Ruths, MD   Reason(s) for Hospitalization  Stroke  Diagnoses  Preliminary cause of death:  Basilar, bilateral PI/PCA stroke Acute respiratory failure Cardiogenic shock, LVEF 20-25% Acute on Chronic Systolic Heart Failure LV thrombus E. coli UTI, MSSA pneumonia Septic shock Type 2 diabetes Acute kidney injury DNR with no escalation of care Comfort measures  Secondary Diagnoses (including complications and co-morbidities):  Principal Problem:   Acute ischemic stroke [facility]) Active Problems:   Type 2 diabetes mellitus ([place])   Obstructive sleep apnea   Encounter for postanesthesia care   Endotracheally intubated   Heart failure with reduced ejection fraction [place])   Brief Hospital Course (including significant findings, care, treatment, and services provided and events leading to death)   56 year old female with PMH as below, which is significant for HFrEF with LVEF 20-25 % 2015-06-22), DM, and OSA on CPAP. March 11, 2023 she presented to [facility] ED with complaints of acute onset left sided weakness and slurred speech. EMS noted non-verbal and right gaze preference. Imaging consistent with occlusion of the distal basilar arterty, right P1, and proximal R P2. She was given systemic TNK and mechanical thrombectomy  with stent retriever on the right with complete recanalization after one pass (TICI3). PCCM consulted for vent management and ICU care.   She had a prolonged hospital stay with cardiogenic shock, echo with LV thrombus, EF 20-25%.  She was started on anticoagulation.  She had multiple episodes of hospital-acquired pneumonia, E. coli UTI, MSSA pneumonia requiring multiple rounds of antibiotics.  She failed  extubation on 02/20/2021 and was unable to wean further.  She underwent tracheostomy on 1/16.  Overall her situation continued to deteriorate throughout the hospital with increasing acute kidney injury, shock, new sepsis.  We had goals of care conversation on 1/21 and she was transitioned to DNR with plans to transition to comfort measures when family had a chance to visit.  She continued to deteriorate and she passed away on the vent on April 05, 2021  Signature:   Marshell Garfinkel MD [facility] See Amion for pager  If no response to pager , please call [phone] until 7pm After 7:00 pm call Elink  [phone] 03/12/2021, 4:29 PM

## 2021-03-20 NOTE — Progress Notes (Signed)
Pt taken off vent for comfort measures per family request. Taylor [name] remains intact with cuff deflated. Family at bedside.

## 2021-03-20 NOTE — Progress Notes (Signed)
100cc of Fentanyl wasted with Charlynne Cousins, RN

## 2021-03-20 DEATH — deceased

## 2023-12-24 IMAGING — CT CT ABD-PELV W/O CM
2 of 4 series · 16 of 46 positions shown, 18 images · non-contrast
Comparison: CT abdomen and pelvis 03/01/2012.

CLINICAL DATA: Fever, GI bleeding.



[Series 3: ap without · axial · non-contrast · 0.85mm/px · z∈[+1302,+1727]mm · 13 of 97 slices shown, 15 images]
[im 6/97  soft-tissue]
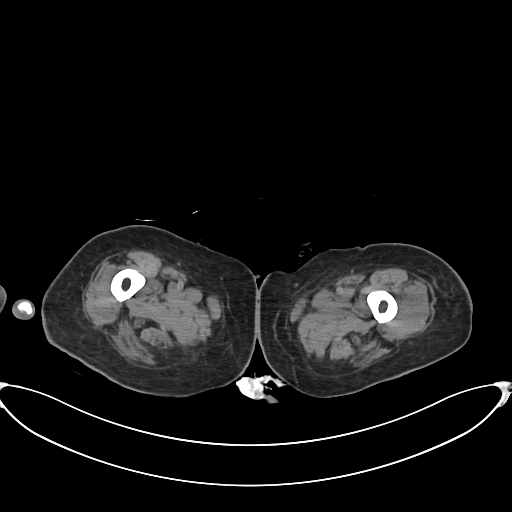
[im 6/97  bone]
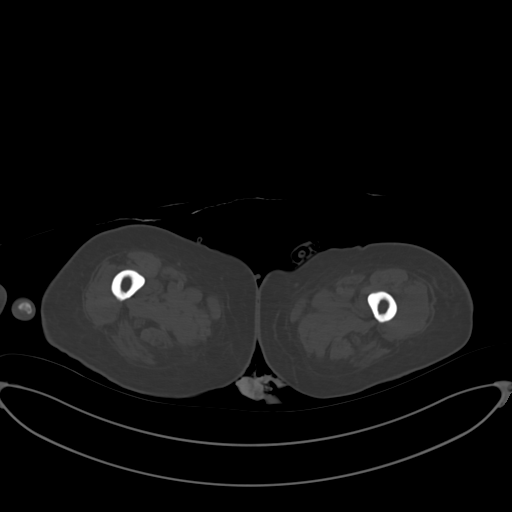
[im 12/97  soft-tissue]
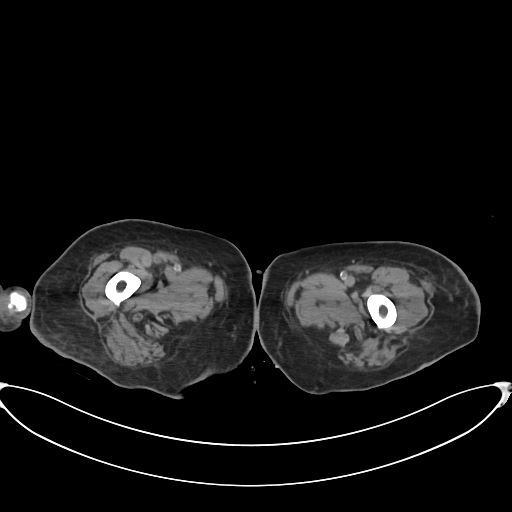
[im 23/97  soft-tissue]
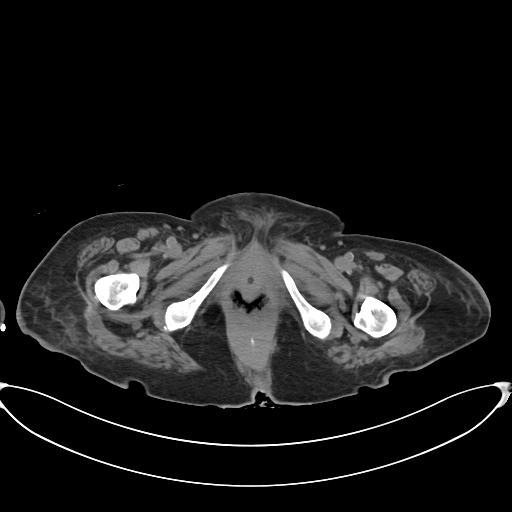
[im 29/97  soft-tissue]
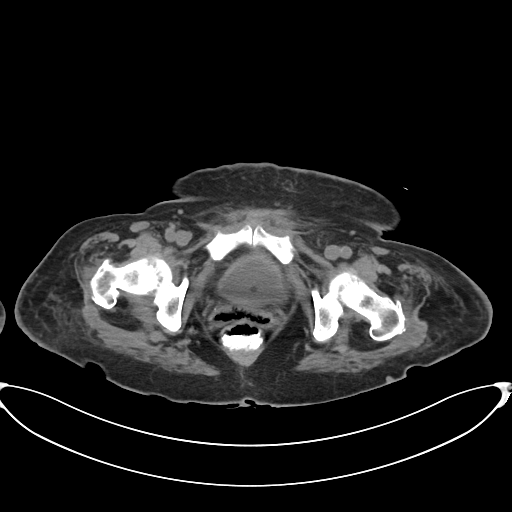
[im 34/97  soft-tissue]
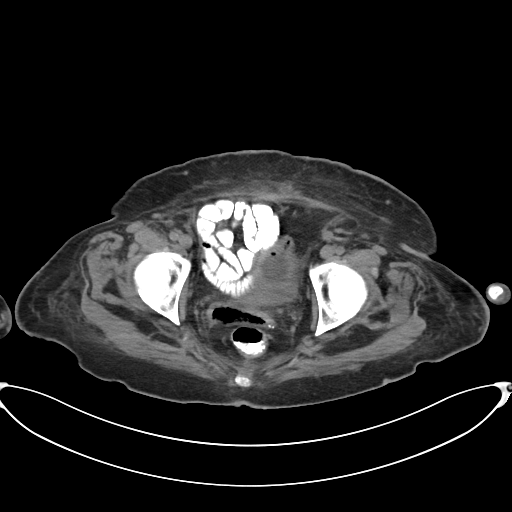
[im 40/97  soft-tissue]
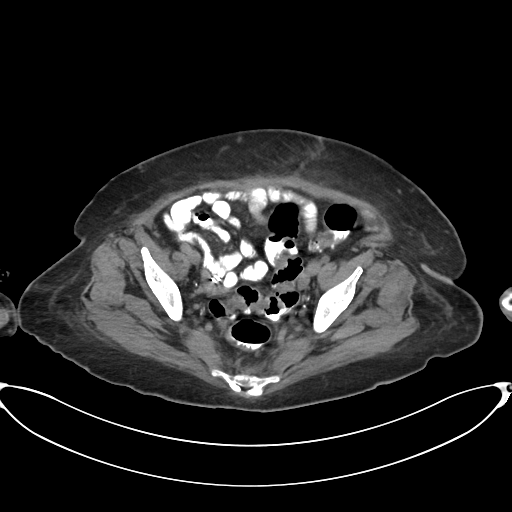
[im 51/97  soft-tissue]
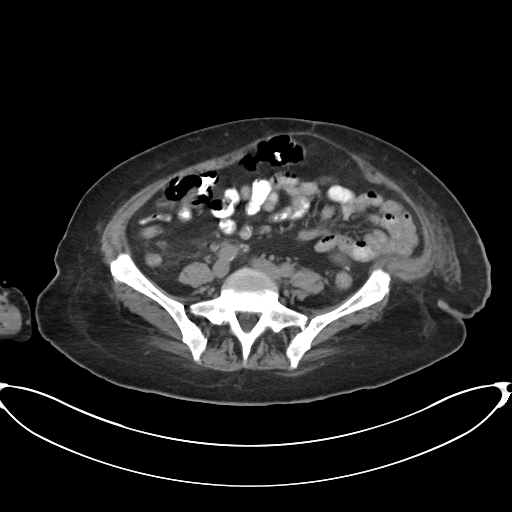
[im 57/97  soft-tissue]
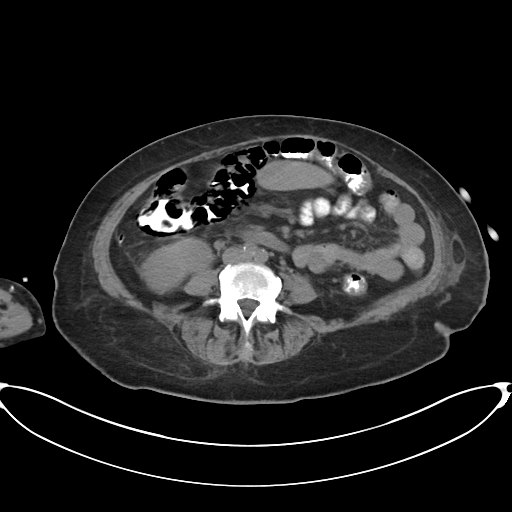
[im 63/97  soft-tissue]
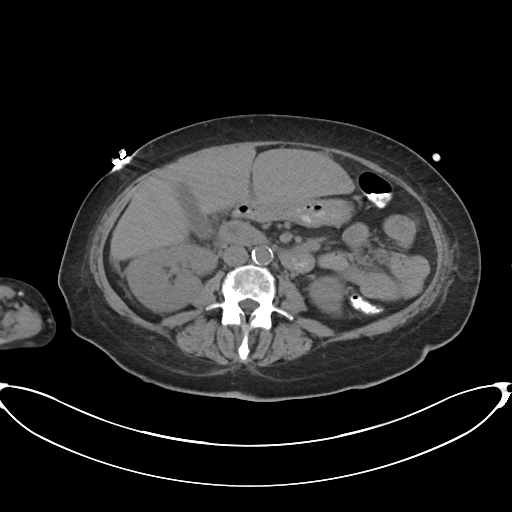
[im 63/97  bone]
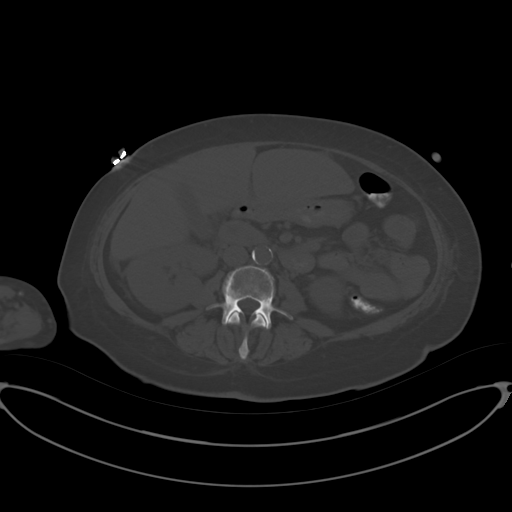
[im 68/97  soft-tissue]
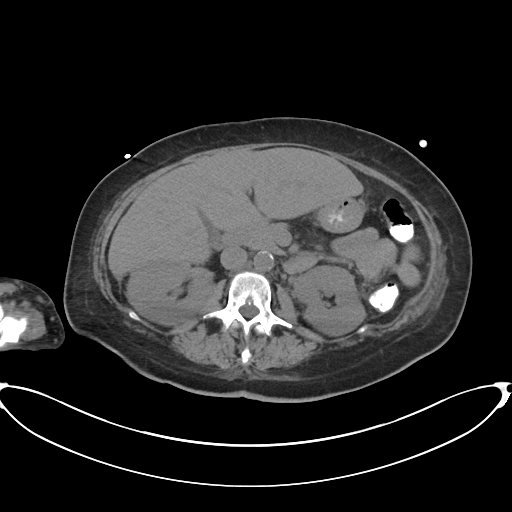
[im 74/97  soft-tissue]
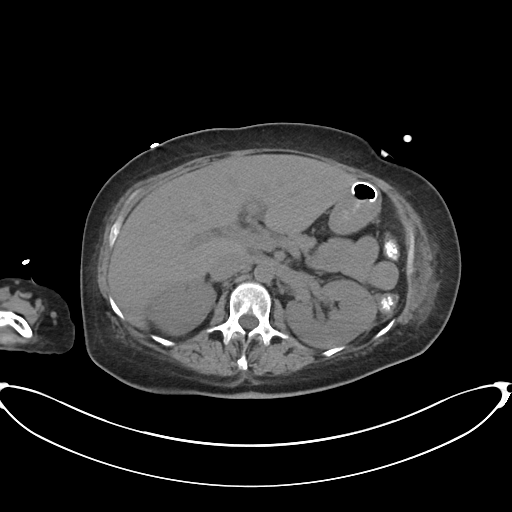
[im 85/97  soft-tissue]
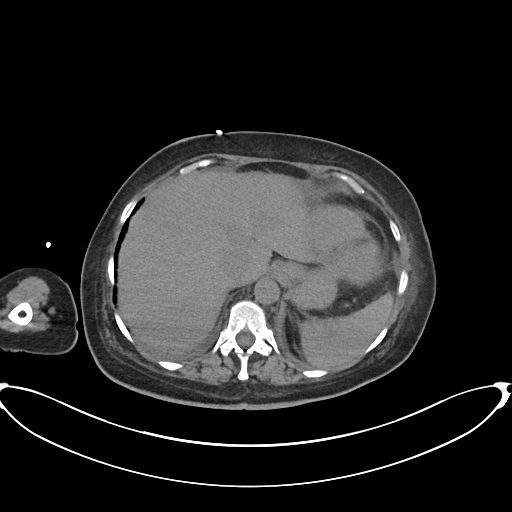
[im 91/97  soft-tissue]
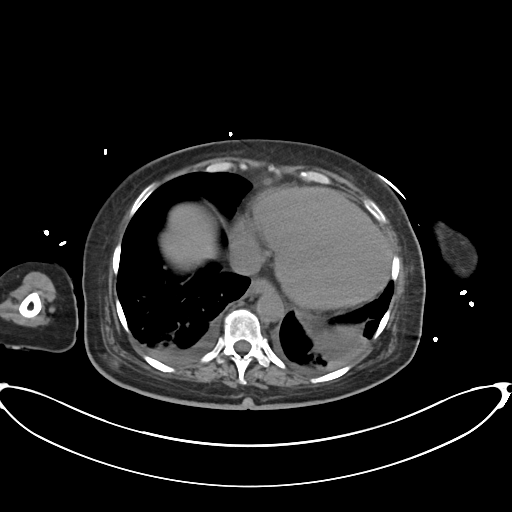

[Series 6: cor · coronal · 0.92mm/px · 3 of 95 slices shown]
[im 32/95  soft-tissue]
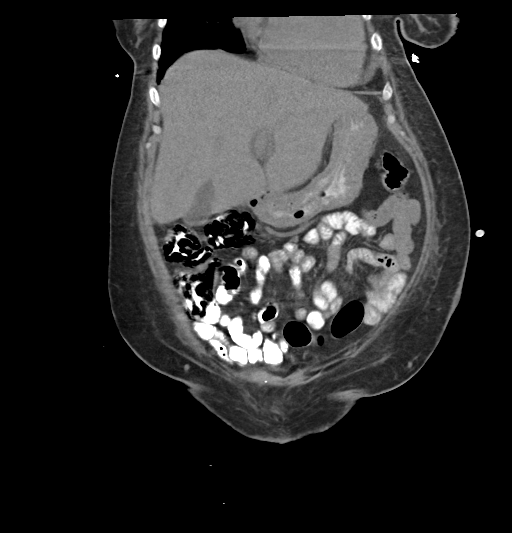
[im 42/95  soft-tissue]
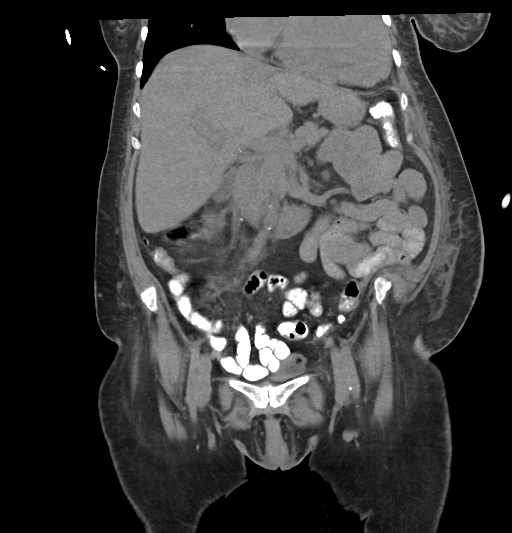
[im 53/95  soft-tissue]
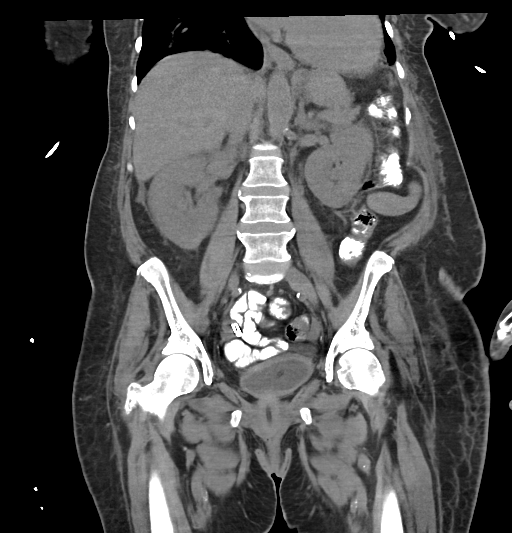

[16 of 46 positions shown; findings below may reference images not displayed]

FINDINGS: Lower chest: There is atelectasis in both lower lobes with small
bilateral pleural effusions. There is additional patchy airspace
disease in the left lower lobe. There are ill-defined nodular and
ground-glass nodular densities throughout the right lower lobe.
Heart is enlarged.

Hepatobiliary: No focal liver abnormality is seen. No gallstones,
gallbladder wall thickening, or biliary dilatation.

Pancreas: Unremarkable. No pancreatic ductal dilatation or
surrounding inflammatory changes.

Spleen: Normal in size without focal abnormality.

Adrenals/Urinary Tract: There is mild nonspecific bilateral
perinephric fat stranding. Go to musculoskeletal the adrenal glands
and kidneys are otherwise within normal limits. There is a Foley
catheter in the bladder.

Stomach/Bowel: Stomach is within normal limits. Appendix appears
normal. No evidence of bowel wall thickening, distention, or
inflammatory changes. Oral contrast reaches the rectum. There is
sigmoid colon diverticulosis without evidence for acute
diverticulitis. Percutaneous gastrostomy in the body of the stomach.

Vascular/Lymphatic: Aortic atherosclerosis. No enlarged abdominal or
pelvic lymph nodes.

Reproductive: Status post hysterectomy. No adnexal masses.

Other: There is no ascites. There is mild body wall edema.
Wide-mouth fat containing umbilical hernia present.

Musculoskeletal: No acute or significant osseous findings.
IMPRESSION: 1. Left lower lobe airspace disease with right lower lobe nodular
and ground-glass nodular opacities worrisome for multifocal
infection. Follow-up chest CT recommended to exclude other
etiologies.
2. Small bilateral pleural effusions.
3. Mild nonspecific bilateral perinephric fat stranding. Correlate
for infection.
4. Sigmoid colon diverticulosis.
5.  Aortic Atherosclerosis ([id number]).
